# Patient Record
Sex: Female | Born: 1975 | Race: White | Hispanic: No | Marital: Married | Smoking: Never smoker
Health system: Southern US, Community
[De-identification: ages and names within clinical notes are randomized; demographics above are authoritative.]

## PROBLEM LIST (undated history)

## (undated) DIAGNOSIS — E039 Hypothyroidism, unspecified: Secondary | ICD-10-CM

## (undated) DIAGNOSIS — K219 Gastro-esophageal reflux disease without esophagitis: Secondary | ICD-10-CM

## (undated) DIAGNOSIS — R0600 Dyspnea, unspecified: Secondary | ICD-10-CM

## (undated) DIAGNOSIS — R519 Headache, unspecified: Secondary | ICD-10-CM

## (undated) DIAGNOSIS — M797 Fibromyalgia: Secondary | ICD-10-CM

## (undated) DIAGNOSIS — F329 Major depressive disorder, single episode, unspecified: Secondary | ICD-10-CM

## (undated) DIAGNOSIS — F32A Depression, unspecified: Secondary | ICD-10-CM

## (undated) DIAGNOSIS — J849 Interstitial pulmonary disease, unspecified: Secondary | ICD-10-CM

## (undated) DIAGNOSIS — K589 Irritable bowel syndrome without diarrhea: Secondary | ICD-10-CM

## (undated) DIAGNOSIS — F419 Anxiety disorder, unspecified: Secondary | ICD-10-CM

## (undated) DIAGNOSIS — M1990 Unspecified osteoarthritis, unspecified site: Secondary | ICD-10-CM

## (undated) DIAGNOSIS — E119 Type 2 diabetes mellitus without complications: Secondary | ICD-10-CM

## (undated) HISTORY — DX: Anxiety disorder, unspecified: F41.9

## (undated) HISTORY — DX: Interstitial pulmonary disease, unspecified: J84.9

## (undated) HISTORY — DX: Hypothyroidism, unspecified: E03.9

## (undated) HISTORY — DX: Depression, unspecified: F32.A

## (undated) HISTORY — DX: Major depressive disorder, single episode, unspecified: F32.9

## (undated) HISTORY — DX: Fibromyalgia: M79.7

## (undated) HISTORY — DX: Unspecified osteoarthritis, unspecified site: M19.90

## (undated) HISTORY — DX: Irritable bowel syndrome, unspecified: K58.9

---

## 1997-08-25 DIAGNOSIS — E039 Hypothyroidism, unspecified: Secondary | ICD-10-CM

## 1997-08-25 HISTORY — DX: Hypothyroidism, unspecified: E03.9

## 1999-08-26 LAB — HM COLONOSCOPY

## 2001-10-20 ENCOUNTER — Other Ambulatory Visit: Admission: RE | Admit: 2001-10-20 | Discharge: 2001-10-20 | Payer: Self-pay | Admitting: Obstetrics and Gynecology

## 2002-10-25 ENCOUNTER — Other Ambulatory Visit: Admission: RE | Admit: 2002-10-25 | Discharge: 2002-10-25 | Payer: Self-pay | Admitting: Obstetrics and Gynecology

## 2003-11-15 ENCOUNTER — Other Ambulatory Visit: Admission: RE | Admit: 2003-11-15 | Discharge: 2003-11-15 | Payer: Self-pay | Admitting: Obstetrics and Gynecology

## 2004-06-21 ENCOUNTER — Other Ambulatory Visit: Admission: RE | Admit: 2004-06-21 | Discharge: 2004-06-21 | Payer: Self-pay | Admitting: Obstetrics and Gynecology

## 2004-07-02 ENCOUNTER — Ambulatory Visit: Payer: Self-pay | Admitting: Internal Medicine

## 2004-08-22 ENCOUNTER — Ambulatory Visit: Payer: Self-pay | Admitting: Endocrinology

## 2004-08-25 ENCOUNTER — Inpatient Hospital Stay (HOSPITAL_COMMUNITY): Admission: AD | Admit: 2004-08-25 | Discharge: 2004-08-28 | Payer: Self-pay | Admitting: Obstetrics and Gynecology

## 2004-11-07 ENCOUNTER — Ambulatory Visit: Payer: Self-pay | Admitting: Endocrinology

## 2004-12-23 ENCOUNTER — Encounter (INDEPENDENT_AMBULATORY_CARE_PROVIDER_SITE_OTHER): Payer: Self-pay | Admitting: *Deleted

## 2004-12-23 LAB — CONVERTED CEMR LAB

## 2005-01-29 ENCOUNTER — Ambulatory Visit: Payer: Self-pay | Admitting: Endocrinology

## 2005-03-13 ENCOUNTER — Ambulatory Visit: Payer: Self-pay | Admitting: Internal Medicine

## 2005-03-20 ENCOUNTER — Ambulatory Visit: Payer: Self-pay | Admitting: Internal Medicine

## 2005-04-17 ENCOUNTER — Ambulatory Visit: Payer: Self-pay | Admitting: Internal Medicine

## 2006-04-15 ENCOUNTER — Ambulatory Visit: Payer: Self-pay | Admitting: Internal Medicine

## 2006-11-02 ENCOUNTER — Ambulatory Visit: Payer: Self-pay | Admitting: Internal Medicine

## 2006-11-02 LAB — CONVERTED CEMR LAB
ALT: 18 units/L (ref 0–40)
AST: 29 units/L (ref 0–37)
Albumin: 3.8 g/dL (ref 3.5–5.2)
Alkaline Phosphatase: 77 units/L (ref 39–117)
BUN: 14 mg/dL (ref 6–23)
Basophils Absolute: 0 10*3/uL (ref 0.0–0.1)
Basophils Relative: 0.9 % (ref 0.0–1.0)
Bilirubin, Direct: 0.1 mg/dL (ref 0.0–0.3)
CO2: 30 meq/L (ref 19–32)
Calcium: 8.6 mg/dL (ref 8.4–10.5)
Chloride: 103 meq/L (ref 96–112)
Creatinine, Ser: 0.8 mg/dL (ref 0.4–1.2)
Eosinophils Absolute: 0.2 10*3/uL (ref 0.0–0.6)
Eosinophils Relative: 3.3 % (ref 0.0–5.0)
Free T4: 0.4 ng/dL — ABNORMAL LOW (ref 0.6–1.6)
GFR calc Af Amer: 108 mL/min
GFR calc non Af Amer: 89 mL/min
Glucose, Bld: 117 mg/dL — ABNORMAL HIGH (ref 70–99)
HCT: 38.1 % (ref 36.0–46.0)
Hemoglobin: 13 g/dL (ref 12.0–15.0)
Hgb A1c MFr Bld: 5.9 % (ref 4.6–6.0)
Lymphocytes Relative: 24.8 % (ref 12.0–46.0)
MCHC: 34.2 g/dL (ref 30.0–36.0)
MCV: 90.9 fL (ref 78.0–100.0)
Monocytes Absolute: 0.5 10*3/uL (ref 0.2–0.7)
Monocytes Relative: 8.8 % (ref 3.0–11.0)
Neutro Abs: 3.2 10*3/uL (ref 1.4–7.7)
Neutrophils Relative %: 62.2 % (ref 43.0–77.0)
Platelets: 258 10*3/uL (ref 150–400)
Potassium: 4.1 meq/L (ref 3.5–5.1)
RBC: 4.19 M/uL (ref 3.87–5.11)
RDW: 15.3 % — ABNORMAL HIGH (ref 11.5–14.6)
Sodium: 140 meq/L (ref 135–145)
T3, Free: 3.7 pg/mL (ref 2.3–4.2)
TSH: >100 microintl units/mL — ABNORMAL HIGH (ref 0.35–5.50)
Total Bilirubin: 0.7 mg/dL (ref 0.3–1.2)
Total Protein: 7.1 g/dL (ref 6.0–8.3)
WBC: 5.2 10*3/uL (ref 4.5–10.5)

## 2006-11-06 ENCOUNTER — Ambulatory Visit: Payer: Self-pay | Admitting: Internal Medicine

## 2006-12-11 ENCOUNTER — Ambulatory Visit: Payer: Self-pay | Admitting: Internal Medicine

## 2006-12-11 LAB — CONVERTED CEMR LAB: TSH: 0.37 microintl units/mL (ref 0.35–5.50)

## 2006-12-24 ENCOUNTER — Ambulatory Visit: Payer: Self-pay | Admitting: Internal Medicine

## 2007-02-12 ENCOUNTER — Ambulatory Visit (HOSPITAL_COMMUNITY): Admission: RE | Admit: 2007-02-12 | Discharge: 2007-02-12 | Payer: Self-pay | Admitting: Obstetrics and Gynecology

## 2007-05-05 ENCOUNTER — Telehealth: Payer: Self-pay | Admitting: Internal Medicine

## 2007-07-19 ENCOUNTER — Ambulatory Visit: Payer: Self-pay | Admitting: Internal Medicine

## 2007-07-25 LAB — CONVERTED CEMR LAB
Hgb A1c MFr Bld: 5.3 % (ref 4.6–6.0)
TSH: 1.77 microintl units/mL (ref 0.35–5.50)

## 2007-07-26 ENCOUNTER — Encounter (INDEPENDENT_AMBULATORY_CARE_PROVIDER_SITE_OTHER): Payer: Self-pay | Admitting: *Deleted

## 2007-07-26 DIAGNOSIS — E039 Hypothyroidism, unspecified: Secondary | ICD-10-CM | POA: Insufficient documentation

## 2007-07-26 DIAGNOSIS — M797 Fibromyalgia: Secondary | ICD-10-CM | POA: Insufficient documentation

## 2007-07-26 DIAGNOSIS — Z8719 Personal history of other diseases of the digestive system: Secondary | ICD-10-CM | POA: Insufficient documentation

## 2008-02-14 ENCOUNTER — Ambulatory Visit: Payer: Self-pay | Admitting: Internal Medicine

## 2008-02-14 DIAGNOSIS — R5381 Other malaise: Secondary | ICD-10-CM

## 2008-02-14 DIAGNOSIS — R5383 Other fatigue: Secondary | ICD-10-CM | POA: Insufficient documentation

## 2008-02-16 ENCOUNTER — Ambulatory Visit: Payer: Self-pay | Admitting: Internal Medicine

## 2008-02-24 ENCOUNTER — Encounter (INDEPENDENT_AMBULATORY_CARE_PROVIDER_SITE_OTHER): Payer: Self-pay | Admitting: *Deleted

## 2008-02-24 LAB — CONVERTED CEMR LAB
ALT: 16 units/L (ref 0–35)
AST: 23 units/L (ref 0–37)
Albumin: 3.7 g/dL (ref 3.5–5.2)
Alkaline Phosphatase: 51 units/L (ref 39–117)
BUN: 15 mg/dL (ref 6–23)
Basophils Absolute: 0 10*3/uL (ref 0.0–0.1)
Basophils Relative: 0.7 % (ref 0.0–1.0)
Bilirubin, Direct: 0.1 mg/dL (ref 0.0–0.3)
CO2: 27 meq/L (ref 19–32)
Calcium: 8.7 mg/dL (ref 8.4–10.5)
Chloride: 104 meq/L (ref 96–112)
Creatinine, Ser: 0.7 mg/dL (ref 0.4–1.2)
Eosinophils Absolute: 0.2 10*3/uL (ref 0.0–0.7)
Eosinophils Relative: 3.8 % (ref 0.0–5.0)
GFR calc Af Amer: 125 mL/min
GFR calc non Af Amer: 103 mL/min
Glucose, Bld: 97 mg/dL (ref 70–99)
HCT: 34.4 % — ABNORMAL LOW (ref 36.0–46.0)
Hemoglobin: 11.7 g/dL — ABNORMAL LOW (ref 12.0–15.0)
Lymphocytes Relative: 22.8 % (ref 12.0–46.0)
MCHC: 34.1 g/dL (ref 30.0–36.0)
MCV: 86.2 fL (ref 78.0–100.0)
Monocytes Absolute: 0.5 10*3/uL (ref 0.1–1.0)
Monocytes Relative: 11.2 % (ref 3.0–12.0)
Neutro Abs: 2.6 10*3/uL (ref 1.4–7.7)
Neutrophils Relative %: 61.5 % (ref 43.0–77.0)
Platelets: 248 10*3/uL (ref 150–400)
Potassium: 4 meq/L (ref 3.5–5.1)
RBC: 3.99 M/uL (ref 3.87–5.11)
RDW: 13.5 % (ref 11.5–14.6)
Sodium: 137 meq/L (ref 135–145)
TSH: 1.15 microintl units/mL (ref 0.35–5.50)
Total Bilirubin: 0.8 mg/dL (ref 0.3–1.2)
Total CK: 134 units/L (ref 7–177)
Total Protein: 6.6 g/dL (ref 6.0–8.3)
Vit D, 1,25-Dihydroxy: 30 (ref 30–89)
WBC: 4.3 10*3/uL — ABNORMAL LOW (ref 4.5–10.5)

## 2008-03-02 ENCOUNTER — Telehealth (INDEPENDENT_AMBULATORY_CARE_PROVIDER_SITE_OTHER): Payer: Self-pay | Admitting: *Deleted

## 2008-03-03 ENCOUNTER — Ambulatory Visit: Payer: Self-pay | Admitting: Internal Medicine

## 2008-03-03 ENCOUNTER — Telehealth (INDEPENDENT_AMBULATORY_CARE_PROVIDER_SITE_OTHER): Payer: Self-pay | Admitting: *Deleted

## 2008-03-14 ENCOUNTER — Telehealth (INDEPENDENT_AMBULATORY_CARE_PROVIDER_SITE_OTHER): Payer: Self-pay | Admitting: *Deleted

## 2008-03-14 LAB — CONVERTED CEMR LAB
Basophils Absolute: 0 10*3/uL (ref 0.0–0.1)
Basophils Relative: 0.6 % (ref 0.0–1.0)
Eosinophils Absolute: 0.3 10*3/uL (ref 0.0–0.7)
Eosinophils Relative: 5.7 % — ABNORMAL HIGH (ref 0.0–5.0)
Folate: 17.8 ng/mL
HCT: 37.3 % (ref 36.0–46.0)
Hemoglobin: 12.8 g/dL (ref 12.0–15.0)
Iron: 42 ug/dL (ref 42–145)
Lymphocytes Relative: 20.1 % (ref 12.0–46.0)
MCHC: 34.3 g/dL (ref 30.0–36.0)
MCV: 86.4 fL (ref 78.0–100.0)
Monocytes Absolute: 0.6 10*3/uL (ref 0.1–1.0)
Monocytes Relative: 13 % — ABNORMAL HIGH (ref 3.0–12.0)
Neutro Abs: 2.8 10*3/uL (ref 1.4–7.7)
Neutrophils Relative %: 60.6 % (ref 43.0–77.0)
Platelets: 258 10*3/uL (ref 150–400)
RBC: 4.32 M/uL (ref 3.87–5.11)
RDW: 13.6 % (ref 11.5–14.6)
Saturation Ratios: 9.4 % — ABNORMAL LOW (ref 20.0–50.0)
Transferrin: 318.9 mg/dL (ref >=212.0)
Vitamin B-12: 165 pg/mL — ABNORMAL LOW (ref 211–911)
WBC: 4.6 10*3/uL (ref 4.5–10.5)

## 2008-03-17 ENCOUNTER — Ambulatory Visit: Payer: Self-pay | Admitting: Internal Medicine

## 2008-03-17 DIAGNOSIS — E538 Deficiency of other specified B group vitamins: Secondary | ICD-10-CM | POA: Insufficient documentation

## 2008-03-22 ENCOUNTER — Ambulatory Visit: Payer: Self-pay | Admitting: Internal Medicine

## 2008-03-30 ENCOUNTER — Ambulatory Visit: Payer: Self-pay | Admitting: Internal Medicine

## 2008-04-05 ENCOUNTER — Ambulatory Visit: Payer: Self-pay | Admitting: Internal Medicine

## 2008-04-17 ENCOUNTER — Telehealth: Payer: Self-pay | Admitting: Internal Medicine

## 2008-04-19 ENCOUNTER — Ambulatory Visit: Payer: Self-pay | Admitting: Internal Medicine

## 2008-04-19 DIAGNOSIS — F418 Other specified anxiety disorders: Secondary | ICD-10-CM | POA: Insufficient documentation

## 2008-04-19 DIAGNOSIS — F411 Generalized anxiety disorder: Secondary | ICD-10-CM

## 2008-05-05 ENCOUNTER — Ambulatory Visit: Payer: Self-pay | Admitting: Internal Medicine

## 2008-06-19 ENCOUNTER — Ambulatory Visit: Payer: Self-pay | Admitting: Internal Medicine

## 2008-07-10 ENCOUNTER — Ambulatory Visit: Payer: Self-pay | Admitting: Internal Medicine

## 2008-07-25 ENCOUNTER — Telehealth: Payer: Self-pay | Admitting: Internal Medicine

## 2008-07-27 ENCOUNTER — Ambulatory Visit: Payer: Self-pay | Admitting: Internal Medicine

## 2008-07-30 LAB — CONVERTED CEMR LAB
TSH: 9.02 microintl units/mL — ABNORMAL HIGH (ref 0.35–5.50)
Vit D, 1,25-Dihydroxy: 44 (ref 30–89)

## 2008-07-31 ENCOUNTER — Telehealth (INDEPENDENT_AMBULATORY_CARE_PROVIDER_SITE_OTHER): Payer: Self-pay | Admitting: *Deleted

## 2008-08-02 ENCOUNTER — Ambulatory Visit: Payer: Self-pay | Admitting: Internal Medicine

## 2008-08-22 ENCOUNTER — Ambulatory Visit: Payer: Self-pay | Admitting: Internal Medicine

## 2008-08-22 ENCOUNTER — Telehealth (INDEPENDENT_AMBULATORY_CARE_PROVIDER_SITE_OTHER): Payer: Self-pay | Admitting: *Deleted

## 2008-09-25 ENCOUNTER — Telehealth (INDEPENDENT_AMBULATORY_CARE_PROVIDER_SITE_OTHER): Payer: Self-pay | Admitting: *Deleted

## 2008-09-25 ENCOUNTER — Telehealth: Payer: Self-pay | Admitting: Internal Medicine

## 2008-09-26 ENCOUNTER — Ambulatory Visit: Payer: Self-pay | Admitting: Internal Medicine

## 2008-10-06 ENCOUNTER — Ambulatory Visit: Payer: Self-pay | Admitting: Internal Medicine

## 2008-10-11 LAB — CONVERTED CEMR LAB
Basophils Absolute: 0 10*3/uL (ref 0.0–0.1)
Basophils Relative: 0.4 % (ref 0.0–3.0)
Eosinophils Absolute: 0.2 10*3/uL (ref 0.0–0.7)
Eosinophils Relative: 4 % (ref 0.0–5.0)
Folate: 15.9 ng/mL
HCT: 35.6 % — ABNORMAL LOW (ref 36.0–46.0)
Hemoglobin: 12.1 g/dL (ref 12.0–15.0)
Iron: 34 ug/dL — ABNORMAL LOW (ref 42–145)
Lymphocytes Relative: 28.8 % (ref 12.0–46.0)
MCHC: 34.1 g/dL (ref 30.0–36.0)
MCV: 88 fL (ref 78.0–100.0)
Monocytes Absolute: 0.4 10*3/uL (ref 0.1–1.0)
Monocytes Relative: 11.4 % (ref 3.0–12.0)
Neutro Abs: 2.1 10*3/uL (ref 1.4–7.7)
Neutrophils Relative %: 55.4 % (ref 43.0–77.0)
Platelets: 260 10*3/uL (ref 150–400)
RBC: 4.04 M/uL (ref 3.87–5.11)
RDW: 12.3 % (ref 11.5–14.6)
Saturation Ratios: 7.3 % — ABNORMAL LOW (ref 20.0–50.0)
TSH: 0.46 microintl units/mL (ref 0.35–5.50)
Transferrin: 331 mg/dL (ref >=212.0)
Vitamin B-12: 359 pg/mL (ref 211–911)
WBC: 3.8 10*3/uL — ABNORMAL LOW (ref 4.5–10.5)

## 2008-10-13 ENCOUNTER — Encounter (INDEPENDENT_AMBULATORY_CARE_PROVIDER_SITE_OTHER): Payer: Self-pay | Admitting: *Deleted

## 2008-10-13 ENCOUNTER — Telehealth (INDEPENDENT_AMBULATORY_CARE_PROVIDER_SITE_OTHER): Payer: Self-pay | Admitting: *Deleted

## 2008-11-22 ENCOUNTER — Ambulatory Visit: Payer: Self-pay | Admitting: Internal Medicine

## 2009-01-02 ENCOUNTER — Ambulatory Visit: Payer: Self-pay | Admitting: Internal Medicine

## 2009-01-24 ENCOUNTER — Ambulatory Visit: Payer: Self-pay | Admitting: Internal Medicine

## 2009-01-24 ENCOUNTER — Telehealth (INDEPENDENT_AMBULATORY_CARE_PROVIDER_SITE_OTHER): Payer: Self-pay | Admitting: *Deleted

## 2009-03-15 ENCOUNTER — Ambulatory Visit: Payer: Self-pay | Admitting: Internal Medicine

## 2009-03-30 ENCOUNTER — Ambulatory Visit: Payer: Self-pay | Admitting: Family Medicine

## 2009-03-30 LAB — CONVERTED CEMR LAB
Bilirubin Urine: NEGATIVE
Glucose, Urine, Semiquant: NEGATIVE
Ketones, urine, test strip: NEGATIVE
Nitrite: NEGATIVE
Protein, U semiquant: NEGATIVE
Specific Gravity, Urine: 1.01
Urobilinogen, UA: 0.2
pH: 6

## 2009-03-31 ENCOUNTER — Encounter: Payer: Self-pay | Admitting: Internal Medicine

## 2009-04-03 ENCOUNTER — Encounter (INDEPENDENT_AMBULATORY_CARE_PROVIDER_SITE_OTHER): Payer: Self-pay | Admitting: *Deleted

## 2009-04-25 ENCOUNTER — Ambulatory Visit: Payer: Self-pay | Admitting: Family Medicine

## 2009-07-13 ENCOUNTER — Ambulatory Visit: Payer: Self-pay | Admitting: Family

## 2009-08-14 ENCOUNTER — Encounter (INDEPENDENT_AMBULATORY_CARE_PROVIDER_SITE_OTHER): Payer: Self-pay | Admitting: *Deleted

## 2009-08-27 ENCOUNTER — Ambulatory Visit: Payer: Self-pay | Admitting: Internal Medicine

## 2009-08-30 LAB — CONVERTED CEMR LAB
TSH: 1.61 microintl units/mL (ref 0.35–5.50)
Vitamin B-12: 221 pg/mL (ref 211–911)

## 2009-11-29 ENCOUNTER — Ambulatory Visit: Payer: Self-pay | Admitting: Internal Medicine

## 2010-04-03 ENCOUNTER — Ambulatory Visit: Payer: Self-pay | Admitting: Internal Medicine

## 2010-04-08 LAB — CONVERTED CEMR LAB
Basophils Absolute: 0 10*3/uL (ref 0.0–0.1)
Basophils Relative: 0.3 % (ref 0.0–3.0)
Eosinophils Absolute: 0.1 10*3/uL (ref 0.0–0.7)
Eosinophils Relative: 2.2 % (ref 0.0–5.0)
HCT: 40.4 % (ref 36.0–46.0)
Hemoglobin: 13.6 g/dL (ref 12.0–15.0)
Lymphocytes Relative: 17.1 % (ref 12.0–46.0)
Lymphs Abs: 1 10*3/uL (ref 0.7–4.0)
MCHC: 33.6 g/dL (ref 30.0–36.0)
MCV: 95.7 fL (ref 78.0–100.0)
Monocytes Absolute: 0.3 10*3/uL (ref 0.1–1.0)
Monocytes Relative: 4.9 % (ref 3.0–12.0)
Neutro Abs: 4.2 10*3/uL (ref 1.4–7.7)
Neutrophils Relative %: 75.5 % (ref 43.0–77.0)
Platelets: 235 10*3/uL (ref 150.0–400.0)
RBC: 4.22 M/uL (ref 3.87–5.11)
RDW: 12.8 % (ref 11.5–14.6)
TSH: 2.2 microintl units/mL (ref 0.35–5.50)
Vitamin B-12: 201 pg/mL — ABNORMAL LOW (ref 211–911)
WBC: 5.6 10*3/uL (ref 4.5–10.5)

## 2010-05-17 ENCOUNTER — Ambulatory Visit: Payer: Self-pay | Admitting: Family Medicine

## 2010-05-17 DIAGNOSIS — M25569 Pain in unspecified knee: Secondary | ICD-10-CM | POA: Insufficient documentation

## 2010-07-15 ENCOUNTER — Ambulatory Visit: Payer: Self-pay | Admitting: Family Medicine

## 2010-07-15 ENCOUNTER — Encounter: Payer: Self-pay | Admitting: Internal Medicine

## 2010-07-15 DIAGNOSIS — M79671 Pain in right foot: Secondary | ICD-10-CM | POA: Insufficient documentation

## 2010-09-24 NOTE — Assessment & Plan Note (Signed)
Summary: b-12/cbs   Nurse Visit    Prior Medications: LEVOTHYROXINE SODIUM 150 MCG TABS (LEVOTHYROXINE SODIUM) take 1 tablet daily by mouth SAVELLA 50 MG  TABS (MILNACIPRAN HCL) 1 by mouth two times a day AMOXIL 500 MG  CAPS (AMOXICILLIN) 1 three times a day Current Allergies: ! * TAPAZOLE ! SYNTHROID ! PROZAC ! [facility]    Medication Administration  Injection # 1:    Medication: Vit B12 1000 mcg    Diagnosis: B12 DEFICIENCY (ICD-266.2)    Route: IM    Site: L deltoid    Exp Date: 12/23/2009    Lot #: [id number]    Mfr: American Regent    Patient tolerated injection without complications    Given by: Ardyth Man (March 30, 2008 12:17 PM)  Orders Added: 1)  Vit B12 1000 mcg [J3420] 2)  Admin of Therapeutic Inj  intramuscular or subcutaneous Lepidus.Putnam    ]  Medication Administration  Injection # 1:    Medication: Vit B12 1000 mcg    Diagnosis: B12 DEFICIENCY (ICD-266.2)    Route: IM    Site: L deltoid    Exp Date: 12/23/2009    Lot #: [id number]    Mfr: American Regent    Patient tolerated injection without complications    Given by: Ardyth Man (March 30, 2008 12:17 PM)  Orders Added: 1)  Vit B12 1000 mcg [J3420] 2)  Admin of Therapeutic Inj  intramuscular or subcutaneous [96372]    Appended Document: b-12/cbs

## 2010-09-24 NOTE — Progress Notes (Signed)
Summary: change to another med - dr hopper  Phone Note Call from Patient Call back at Work Phone [phone]   Caller: Patient Summary of Call: patient said she feels dr hopper didnt understand message about Liane Comber - she said it was working but cost has gone up & she cant afford it Initial call taken by: Okey Regal [place],  September 25, 2008 4:37 PM  Follow-up for Phone Call        LEFT MESSAGE ON MACHINE INFORMING PATIENT OF DR.HOPPERS ADVISEMENTS. SEE LAST PHONE NOTE. Follow-up by: Shonna Chock,  September 25, 2008 4:47 PM

## 2010-09-24 NOTE — Assessment & Plan Note (Signed)
Summary: pink eye/lch   Vital Signs:  Patient profile:   35 year old female Weight:      158.38 pounds Temp:     99.1 degrees F oral Pulse rate:   76 / minute Pulse rhythm:   regular Resp:     14 per minute BP sitting:   118 / 76  (left arm) Cuff size:   regular  Vitals Entered By: Army Fossa CMA (November 29, 2009 12:04 PM) CC: Pt here c/o "Pink eye". Woke up this a.m. with eye matted shut, very red.  Comments Med list reviewed. Army Fossa CMA  November 29, 2009 12:05 PM    CC:  Pt here c/o "Pink eye". Woke up this a.m. with eye matted shut and very red. Marland Kitchen  History of Present Illness: OS "glued shut" this am with mild pain ; no loss of vision. 35 year old fine. No Rx to date.  Allergies: 1)  ! * Tapazole 2)  ! Synthroid 3)  ! Prozac 4)  ! Wellbutrin  Review of Systems General:  Denies chills, fever, and sweats. Eyes:  Complains of blurring, discharge, eye pain, light sensitivity, and red eye; denies vision loss-1 eye. ENT:  Denies nasal congestion and sinus pressure; No purulence.  Physical Exam  General:  in no acute distress; alert,appropriate and cooperative throughout examination Eyes:  vision grossly intact, pupils equal, pupils round, pupils reactive to light, but scleritis and conjunctival injection OS.   Cervical Nodes:  No lymphadenopathy noted Axillary Nodes:  No palpable lymphadenopathy   Impression & Recommendations:  Problem # 1:  CONJUNCTIVITIS (ICD-372.30)  Complete Medication List: 1)  Levothyroxine Sodium 175 Mcg Tabs (Levothyroxine sodium) .... Take one tablet daily except 1/2 every weds 2)  Vitamin D 1000 Unit Tabs (Cholecalciferol) .Marland Kitchen.. 1 by mouth once daily 3)  Celexa 40 Mg Tabs (Citalopram hydrobromide) .... Daily as directed 4)  Abilify 2 Mg Tabs (Aripiprazole) .... 1/2 once daily 5)  Ferrous Sulfate 83 Mg Tabs (Ferrous sulfate) .Marland Kitchen.. 1 by mouth 1 per day 6)  Sm Glucosamine Sulfate 1000 Mg Tabs (Glucosamine sulfate) .Marland Kitchen.. 1 tab by mouth  once a day  Patient Instructions: 1)  Strict hand washing as described. Erythromycin Ophthalmic  1 cm into lower eye sac every 2-3 hrs while awake X 7-10 days.Report Warning Signs as discussed

## 2010-09-24 NOTE — Letter (Signed)
Summary: Results Follow up Letter  [facility] [place]  [address] [place], [place] [address]   Phone: [phone]  Fax: [phone]    10/13/2008 MRN: [id number]  [name] [address] [place], [place]  [address]  Dear [name],  The following are the results of your recent test(s):  Test         Result    Pap Smear:        Normal _____  Not Normal _____ Comments: ______________________________________________________ Cholesterol: LDL(Bad cholesterol):         Your goal is less than:         HDL (Good cholesterol):       Your goal is more than: Comments:  ______________________________________________________ Mammogram:        Normal _____  Not Normal _____ Comments:  ___________________________________________________________________ Hemoccult:        Normal _____  Not normal _______ Comments:    _____________________________________________________________________ Other Tests: PLEASE SEE ATTACHED LABS DONE ON 10/06/2008    We routinely do not discuss normal results over the telephone.  If you desire a copy of the results, or you have any questions about this information we can discuss them at your next office visit.   Sincerely,

## 2010-09-24 NOTE — Assessment & Plan Note (Signed)
Summary: B 12/KDC   Nurse Visit     Allergies: 1)  ! * Tapazole 2)  ! Synthroid 3)  ! Prozac 4)  ! Wellbutrin     Medication Administration  Injection # 1:    Medication: Vit B12 1000 mcg    Diagnosis: B12 DEFICIENCY (ICD-266.2)    Route: IM    Site: L deltoid    Exp Date: 09/2010    Lot #: [id number]    Mfr: American Regent    Patient tolerated injection without complications    Given by: Floydene Flock CMA (January 24, 2009 10:36 AM)  Orders Added: 1)  Admin of Therapeutic Inj  intramuscular or subcutaneous [96372] 2)  Vit B12 1000 mcg [J3420]      Medication Administration  Injection # 1:    Medication: Vit B12 1000 mcg    Diagnosis: B12 DEFICIENCY (ICD-266.2)    Route: IM    Site: L deltoid    Exp Date: 09/2010    Lot #: [id number]    Mfr: American Regent    Patient tolerated injection without complications    Given by: Floydene Flock CMA (January 24, 2009 10:36 AM)  Orders Added: 1)  Admin of Therapeutic Inj  intramuscular or subcutaneous [96372] 2)  Vit B12 1000 mcg [J3420]

## 2010-09-24 NOTE — Assessment & Plan Note (Signed)
Summary: b-12/cbs   Nurse Visit    Prior Medications: LEVOTHYROXINE SODIUM 150 MCG TABS (LEVOTHYROXINE SODIUM) take 1 tablet daily by mouth SAVELLA 50 MG  TABS (MILNACIPRAN HCL) 1 by mouth two times a day AMOXIL 500 MG  CAPS (AMOXICILLIN) 1 three times a day Current Allergies: ! * TAPAZOLE ! SYNTHROID ! PROZAC ! [facility]    Medication Administration  Injection # 1:    Medication: Vit B12 1000 mcg    Diagnosis: B12 DEFICIENCY (ICD-266.2)    Route: IM    Site: R deltoid    Exp Date: 01/2010    Lot #: [id number]    Mfr: American Regent    Patient tolerated injection without complications    Given by: Floydene Flock CMA (March 22, 2008 3:27 PM)  Orders Added: 1)  Admin of Therapeutic Inj  intramuscular or subcutaneous [96372] 2)  Vit B12 1000 mcg Kallinikos.Fontana    ]  Medication Administration  Injection # 1:    Medication: Vit B12 1000 mcg    Diagnosis: B12 DEFICIENCY (ICD-266.2)    Route: IM    Site: R deltoid    Exp Date: 01/2010    Lot #: [id number]    Mfr: American Regent    Patient tolerated injection without complications    Given by: Floydene Flock CMA (March 22, 2008 3:27 PM)  Orders Added: 1)  Admin of Therapeutic Inj  intramuscular or subcutaneous [96372] 2)  Vit B12 1000 mcg [J3420]

## 2010-09-24 NOTE — Progress Notes (Signed)
Summary: alternative to medication  Phone Note Call from Patient   Summary of Call: Dr.Paz   call back   [phone]  patient said she want to know if there was an alternative to abilify? Initial call taken by: Vanessa Swaz[place],  September 25, 2008 11:32 AM  Follow-up for Phone Call        ABILIFY FILLED ON 08/23/2008-PATIENT SAID MED WAS WORKING. NOW PATIENT IS REQUESTING ALTERNATIVE. Follow-up by: Shonna Chock,  September 25, 2008 2:25 PM  Additional Follow-up for Phone Call Additional follow up Details #1::        I think consulting a Psychiatrist as a Neurochemist would be best long term option to identify optimal medication regimen.I would welcome that expert opinion  Additional Follow-up by: Marga Melnick MD,  September 25, 2008 2:42 PM    Additional Follow-up for Phone Call Additional follow up Details #2::    DR.Lela Gell LEFT MESSAGE ON MACHINE GIVING INSTRUCTION. Follow-up by: Shonna Chock,  September 25, 2008 2:51 PM

## 2010-09-24 NOTE — Assessment & Plan Note (Signed)
Summary: SINUS INFECTION/SCM   Vital Signs:  Patient Profile:   35 Years Old Female Weight:      154.6 pounds Temp:     98.1 degrees F oral Pulse rate:   76 / minute Resp:     15 per minute BP sitting:   110 / 72  (left arm) Cuff size:   large  Vitals Entered By: Shonna Chock (March 03, 2008 12:27 PM)                 Chief Complaint:  CONGESTION, FEVER, DIZZINESS X 2 DAYS, and DISCUSS SELEVA.  History of Present Illness: Myalgias began 24 hrs off Cymbalta(? "Withdrawal [place] ") URI symptoms as of this am with PNDrainage.    Current Allergies: ! * TAPAZOLE ! SYNTHROID ! PROZAC ! WELLBUTRIN     Review of Systems  General      Complains of chills, fever, and sweats.      Temp to 100  Eyes      Denies discharge, eye pain, and red eye.  ENT      Complains of nasal congestion, sinus pressure, and sore throat.      Denies earache.      Facial pain & frontal ha w/o purulence  Resp      Denies cough and sputum productive.   Physical Exam  General:     Well-developed,well-nourished,in no acute distress; alert,appropriate and cooperative throughout examination Eyes:     No corneal or conjunctival inflammation noted. EOMI. Perrla.  Vision grossly normal. Ears:     External ear exam shows no significant lesions or deformities.  Otoscopic examination reveals clear canals, tympanic membranes are intact bilaterally without bulging, retraction, inflammation or discharge. Hearing is grossly normal bilaterally. Nose:     External nasal examination shows no deformity or inflammation. Nasal mucosa are pink and moist without lesions or exudates.Minimal hyponasal speech Mouth:     Oral mucosa and oropharynx without lesions or exudates.  Teeth in good repair. Cervical Nodes:     No lymphadenopathy noted Axillary Nodes:     No palpable lymphadenopathy    Impression & Recommendations:  Problem # 1:  URI (ICD-465.9)  Complete Medication List: 1)  Levothyroxine  Sodium 150 Mcg Tabs (Levothyroxine sodium) .... Take 1 tablet daily by mouth 2)  Savella Titration Pack 12.5 & 25 & 50 Mg Misc (Milnacipran hcl) .... As per pack 3)  Amoxil 500 Mg Caps (Amoxicillin) .Marland Kitchen.. 1 three times a day   Patient Instructions: 1)  Drink as much fluid as you can tolerate for the next few days.Neti pot once daily as needed for nasal congestion.   Prescriptions: AMOXIL 500 MG  CAPS (AMOXICILLIN) 1 three times a day  #30 x 0   Entered and Authorized by:   Marga Melnick MD   Signed by:   Marga Melnick MD on 03/03/2008   Method used:   Print then Give to Patient   RxID:   [id number] SAVELLA TITRATION PACK 12.5 & 25 & 50 MG  MISC (MILNACIPRAN HCL) as per pack  #1 pack x 0   Entered and Authorized by:   Marga Melnick MD   Signed by:   Marga Melnick MD on 03/03/2008   Method used:   Print then Give to Patient   RxID:   [id number]  ]

## 2010-09-24 NOTE — Assessment & Plan Note (Signed)
Summary: ro4--f/u from last visit//ph   Vital Signs:  Patient Profile:   35 Years Old Female Weight:      157.8 pounds Pulse rate:   94 / minute Resp:     15 per minute BP sitting:   108 / 68  (left arm) Cuff size:   large  Vitals Entered By: Shonna Chock (July 10, 2008 4:04 PM)                 Chief Complaint:  FOLLOW-UP.  History of Present Illness: On Citalopram  AND Savella ; Savella only partially controlling Fibromyalgia.  Citalopram  helping mood swings ,but "more agitated & frustrated" with suboptimal  response to  two meds.She is having difficulty describing  present status . She is agreeable to assessment by a Psychiatrist in reference to optimal "neurochemistry" management  &  ruling out Bipolar Disorder     Current Allergies (reviewed today): ! * TAPAZOLE ! SYNTHROID ! PROZAC ! WELLBUTRIN     Review of Systems  General      Complains of fatigue.      Denies weight loss.      7# weight gain  GI      Complains of hemorrhoids and indigestion.      Denies abdominal pain, bloody stools, and dark tarry stools.      [place] not interested in eating & "I feel full after a few bites". Occa dyspepsia. Hemorrhoidal flare with hard stools  Psych      Complains of anxiety, depression, easily angered, easily tearful, and irritability.   Physical Exam  General:     in no acute distress; alert,appropriate and cooperative throughout examination; mood labile Neck:     No deformities, masses, or tenderness noted. Neurologic:     alert & oriented X3 and DTRs symmetrical and normal.   Psych:     She thought "pink pill " was Cymbalta; it is generic Citalopram.Not anxious appearing, and not depressed appearing.   Intermittently laughing . MDQ :7+ answers ; described as "minor" issue     Impression & Recommendations:  Problem # 1:  FIBROMYALGIA (ICD-729.1)  Problem # 2:  ANXIETY STATE, UNSPECIFIED (ICD-300.00)  Mixed anxiety/ depression by history;R/O Bipolar  Disorder The following medications were removed from the medication list:    Cymbalta 60 Mg Cpep (Duloxetine hcl) .Marland Kitchen... 1 by mouth once daily  Her updated medication list for this problem includes:    Citalopram Hydrobromide 20 Mg Tabs (Citalopram hydrobromide) .Marland Kitchen... 1 once daily  Orders: Psychiatric Referral (Psych)   Complete Medication List: 1)  Levothyroxine Sodium 150 Mcg Tabs (Levothyroxine sodium) .... Take 1 tablet daily by mouth 2)  Savella 50 Mg Tabs (Milnacipran hcl) .Marland Kitchen.. 1 by mouth two times a day 3)  Accutane 60mg   .... Alternates between 1 by mouth once daily and 2 by mouth once daily 4)  Citalopram Hydrobromide 20 Mg Tabs (Citalopram hydrobromide) .Marland Kitchen.. 1 once daily   Patient Instructions: 1)  Wean off Savella as per the  titration pack & then add Abilify 2 mg  once daily  as trial before the Neurochemistry evaluation by the Psychiatrist. 2)  Please schedule a follow-up appointment in 3 weeks if Psychiatric evaluation not completed within that time span.   Prescriptions: CITALOPRAM HYDROBROMIDE 20 MG TABS (CITALOPRAM HYDROBROMIDE) 1 once daily  #30 x 5   Entered and Authorized by:   Marga Melnick MD   Signed by:   Marga Melnick MD on 07/11/2008   Method  used:   [facility]ron[facility] to        [phone][facility] (retail)       [address]       [place], [place]  [address]       Ph: [phone]       Fax: [phone]   RxID:   [id number]  ]

## 2010-09-24 NOTE — Assessment & Plan Note (Signed)
Summary: b-12 shot--ph   Nurse Visit     Allergies: 1)  ! * Tapazole 2)  ! Synthroid 3)  ! Prozac 4)  ! Wellbutrin     Medication Administration  Injection # 1:    Medication: Vit B12 1000 mcg    Diagnosis: B12 DEFICIENCY (ICD-266.2)    Route: IM    Site: L deltoid    Exp Date: 07/2010    Lot #: [id number]    Mfr: American Regent    Patient tolerated injection without complications    Given by: Floydene Flock CMA (November 22, 2008 11:29 AM)  Orders Added: 1)  Admin of Therapeutic Inj  intramuscular or subcutaneous [96372] 2)  Vit B12 1000 mcg Kallinikos.Fontana    ]  Medication Administration  Injection # 1:    Medication: Vit B12 1000 mcg    Diagnosis: B12 DEFICIENCY (ICD-266.2)    Route: IM    Site: L deltoid    Exp Date: 07/2010    Lot #: [id number]    Mfr: American Regent    Patient tolerated injection without complications    Given by: Floydene Flock CMA (November 22, 2008 11:29 AM)  Orders Added: 1)  Admin of Therapeutic Inj  intramuscular or subcutaneous [96372] 2)  Vit B12 1000 mcg [J3420]

## 2010-09-24 NOTE — Progress Notes (Signed)
Summary: abilfy samples//HOP  Phone Note Call from Patient Call back at Home Phone [phone]   Caller: Patient Summary of Call: pt called want samples of ABILIFY, HAVE A RX ,but out of work was hoping to get some samples.  Last ov 10/06/08 (pt was informed of office policy ref to samples) Initial call taken by: Kandice Hams,  January 24, 2009 9:36 AM  Follow-up for Phone Call        OK (5 mg 1/2 pill ,now on 2 mg?)  X 6 weeks  if available Follow-up by: Marga Melnick MD,  January 24, 2009 1:58 PM  Additional Follow-up for Phone Call Additional follow up Details #1::        LEFT MESSAGE ON MACHINE WITH DR.HOPPER'S RESPONSE. SAMPLES PLACED AT THE FRONT Additional Follow-up by: Shonna Chock,  January 24, 2009 2:33 PM

## 2010-09-24 NOTE — Progress Notes (Signed)
Summary: **Recent Labs**  Phone Note Outgoing Call   Call placed by: Shonna Chock,  March 14, 2008 2:45 PM Call placed to: Patient Details for Reason: Recent Labs Done on 03/03/2008 Summary of Call: Left message on machine, reason for call was to inform patient of recent lab reports and instruction. Shonna Chock  March 14, 2008 2:46 PM   Follow-up for Phone Call        SPOKE WITH PATIENT, SCHEDULE APPOINTMENT FOR B12 INJECTION, WHILE ON THE PHONE PATIENT SAID SHE NEEDS A REFILL ON SAVELL, SHE IS NOW TAKING 50MG  two times a day . ([facility]. WILL SEND TO DR.HOPPER FOR APPROVAL. Follow-up by: Shonna Chock,  March 15, 2008 9:16 AM  Additional Follow-up for Phone Call Additional follow up Details #1::        PER DR.HOPPER OK TO FILL #60/5REFILL.Fredric Mare Malloy  March 15, 2008 9:20 AM     New/Updated Medications: SAVELLA 50 MG  TABS (MILNACIPRAN HCL) 1 by mouth two times a day   Prescriptions: SAVELLA 50 MG  TABS (MILNACIPRAN HCL) 1 by mouth two times a day  #60 x 5   Entered by:   Shonna Chock   Authorized by:   Marga Melnick MD   Signed by:   Shonna Chock on 03/15/2008   Method used:   Electronically sent to ...       [facility] [phone]*       [address]       [place], [place]  [address]       Ph: [phone]       Fax: [phone]   RxID:   [id number]

## 2010-09-24 NOTE — Assessment & Plan Note (Signed)
Summary: b12--tl   Nurse Visit    Prior Medications: LEVOTHYROXINE SODIUM 150 MCG TABS (LEVOTHYROXINE SODIUM) take 1 tablet daily by mouth SAVELLA 50 MG  TABS (MILNACIPRAN HCL) 1 by mouth two times a day AMOXIL 500 MG  CAPS (AMOXICILLIN) 1 three times a day Current Allergies: ! * TAPAZOLE ! SYNTHROID ! PROZAC ! [facility]    Medication Administration  Injection # 1:    Medication: Vit B12 1000 mcg    Diagnosis: B12 DEFICIENCY (ICD-266.2)    Route: IM    Site: R deltoid    Exp Date: 01/2010    Lot #: [id number]    Mfr: American Regent    Patient tolerated injection without complications    Given by: Floydene Flock CMA (April 05, 2008 11:52 AM)  Orders Added: 1)  Admin of Therapeutic Inj  intramuscular or subcutaneous [96372] 2)  Vit B12 1000 mcg Kallinikos.Fontana    ]  Medication Administration  Injection # 1:    Medication: Vit B12 1000 mcg    Diagnosis: B12 DEFICIENCY (ICD-266.2)    Route: IM    Site: R deltoid    Exp Date: 01/2010    Lot #: [id number]    Mfr: American Regent    Patient tolerated injection without complications    Given by: Floydene Flock CMA (April 05, 2008 11:52 AM)  Orders Added: 1)  Admin of Therapeutic Inj  intramuscular or subcutaneous [96372] 2)  Vit B12 1000 mcg [J3420]

## 2010-09-24 NOTE — Letter (Signed)
Summary: Primary Care Appointment Letter  [facility] [place]  [address] [place], [place] [address]   Phone: [phone]  Fax: [phone]    08/14/2009 MRN: [id number]  [name] [address] [place], [place]  [address]  Dear [name],   Your Primary Care Physician Marga Melnick MD has indicated that:    ____X___it is time to schedule an appointment FOR LABS PER LAST LABS FROM 10/11/08 so pls call and schedule.    _______you missed your appointment on______ and need to call and          reschedule.    _______you need to have lab work done.    _______you need to schedule an appointment discuss lab or test results.    _______you need to call to reschedule your appointment that is                       scheduled on _________.     Please call our office as soon as possible. Our phone number is [phone]_____. Our office is open 8a-5p, Monday through Friday.     Thank you,    [facility]

## 2010-09-24 NOTE — Progress Notes (Signed)
Summary: **Recent Labs**  Phone Note Outgoing Call Call back at Home Phone [phone]   Call placed by: Shonna Chock,  July 31, 2008 9:24 AM Summary of Call: Left message on machine for patient to return call when avaliable, Reason for call: 1.) TSH goal = 1-3; HYPOthyroidism present. Please see me with actual thyroid pills. [facility]- NEED TO SCHEDULE APPOINTMENT!!  2.) Super ; vitamin D deficiency corrected (goal = > 35). No change in dose;recheck in 6 months. [facility]  [name]  July 31, 2008 9:25 AM     Follow-up for Phone Call        D/W PATIENT . . . . Marland KitchenSCHEDULED APPOINTMENT TO FUTHER ADDRESS LABS 08/02/2008./[name] [facility]  July 31, 2008 1:24 PM  Follow-up by: Shonna Chock,  July 31, 2008 1:24 PM

## 2010-09-24 NOTE — Consult Note (Signed)
Summary: [facility]  [facility]   Imported By: Lanelle Bal 07/26/2010 11:34:18  _____________________________________________________________________  External Attachment:    Type:   Image     Comment:   External Document

## 2010-09-24 NOTE — Letter (Signed)
Summary: Results Follow up Letter  [facility] [place]  [address]   [place],  [address]   Phone: [phone]  Fax: [phone]    02/24/2008 MRN: [id number]  [name] [address] [place],   [address]  Dear Ms. Garduno,  The following are the results of your recent test(s):  Test         Result    Pap Smear:        Normal _____  Not Normal _____ Comments: ______________________________________________________ Cholesterol: LDL(Bad cholesterol):         Your goal is less than:         HDL (Good cholesterol):       Your goal is more than: Comments:  ______________________________________________________ Mammogram:        Normal _____  Not Normal _____ Comments:  ___________________________________________________________________ Hemoccult:        Normal _____  Not normal _______ Comments:    _____________________________________________________________________ Other Tests:    We routinely do not discuss normal results over the telephone.  If you desire a copy of the results, or you have any questions about this information we can discuss them at your next office visit.   Sincerely,    

## 2010-09-24 NOTE — Assessment & Plan Note (Signed)
Summary: FOLLOW-UP ON LABS/SCM   Vital Signs:  Patient Profile:   35 Years Old Female Weight:      155 pounds Pulse rate:   98 / minute Resp:     14 per minute BP sitting:   122 / 80  (left arm) Cuff size:   large  Vitals Entered By: Shonna Chock (August 02, 2008 8:09 AM)                 Chief Complaint:  FOLLOW-UP ON LABS.  History of Present Illness: She is taking Synthroid 150 micrograms with BCP .On BCP due to Accutane therapy.TSH now 9.02 ;it was 1.15 in 6/09 on same dose. Vit D now 44;it was 30 . She is still on B12 shots. She has appt with [facility].    Current Allergies (reviewed today): ! * TAPAZOLE ! SYNTHROID ! PROZAC ! WELLBUTRIN     Review of Systems  General      Denies weight loss.  CV      Denies palpitations.  GI      Denies constipation.      Some loose stool occa  Derm      Denies changes in nail beds, dryness, and hair loss.  Neuro      Complains of numbness and tingling.      CTS occa @ night  Endo      Denies cold intolerance, heat intolerance, and weight change.   Physical Exam  General:     well-nourished,in no acute distress; alert,appropriate and cooperative throughout examination Neck:     No deformities, masses, or tenderness noted. Heart:     Normal rate and regular rhythm. S1 and S2 normal without gallop, murmur, click, rub or other extra sounds. Neurologic:     alert & oriented X3 and DTRs symmetrical and normal.   Skin:     Intact without suspicious lesions or rashes Psych:     memory intact for recent and remote, normally interactive, and good eye contact.      Impression & Recommendations:  Problem # 1:  HYPOTHYROIDISM (ICD-244.9)  Problem # 2:  B12 DEFICIENCY (ICD-266.2)  Complete Medication List: 1)  Levothyroxine Sodium 175 Mcg Tabs (levothyroxine Sodium)  .... Take 1 tablet daily by mouth 2)  Savella 50 Mg Tabs (Milnacipran hcl) .Marland Kitchen.. 1 by mouth two times a day 3)  Accutane 60mg   ....  Alternates between 1 by mouth once daily and 2 by mouth once daily 4)  Abilify 2 Mg Tabs (Aripiprazole) .Marland Kitchen.. 1 by mouth once daily 5)  Low-ogestrel 0.3-30 Mg-mcg Tabs (Norgestrel-ethinyl estradiol) .Marland Kitchen.. 1 by mouth q d   Patient Instructions: 1)  Add 1/2 of 50 micrograms Synthroid to 150 micrograms dose. 2)  Please schedule a follow-up appointment in 2 months. 3)  TSH, B12 level prior to visit, ICD-9: 266.2,244.9. Do not take Synthroid with any other meds   ]

## 2010-09-24 NOTE — Assessment & Plan Note (Signed)
Summary: 2 MONTH FOLLOW-UP   Vital Signs:  Patient Profile:   35 Years Old Female Weight:      162 pounds Pulse rate:   72 / minute Resp:     15 per minute BP sitting:   118 / 72  (left arm) Cuff size:   large  Vitals Entered By: Shonna Chock (October 06, 2008 9:08 AM)                 Chief Complaint:  2 MONTH FOLLOW-UP ON MEDICATION.  History of Present Illness: The Citalopram 20 mg once daily & Abilify 2mg  were beneficial ; mood  & fibromyalgia symptoms  were significantly improved. Savella less effective for fibromyalgia symptoms. Abilify copay rose from $20 /month to $50/ month. She lost her job but she is insured under her husband's [employer].    Current Allergies (reviewed today): ! * TAPAZOLE ! SYNTHROID ! PROZAC ! WELLBUTRIN     Review of Systems  General      Complains of fatigue.      Weight variable  ENT      Denies difficulty swallowing and hoarseness.  GI      Denies constipation and diarrhea.  MS      Complains of joint pain and muscle aches.      Denies joint redness, joint swelling, and muscle weakness.      Aching from thighs down  @ rest @ night with some joint symptoms; worse with cold . These symptoms better on Abilify  Derm      Denies changes in nail beds, dryness, and hair loss.  Neuro      Complains of numbness and tingling.      N&T in am ,positional ; not using wrist splint. No classic restless leg symptoms  Psych      Complains of easily tearful.      Denies anxiety, depression, easily angered, irritability, panic attacks, and suicidal thoughts/plans.      "I'm OK but emotions rocky"  Endo      Denies cold intolerance, excessive hunger, excessive thirst, excessive urination, and heat intolerance.   Physical Exam  General:     well-nourished,in no acute distress; alert,appropriate and cooperative throughout examination Neck:     No deformities, masses, or tenderness noted. Lungs:     Normal respiratory effort,  chest expands symmetrically. Lungs are clear to auscultation, no crackles or wheezes. Heart:     Normal rate and regular rhythm. S1 and S2 normal without gallop, murmur, click, rub or other extra sounds. Pulses:     R and Lradial,dorsalis pedis and posterior tibial pulses are full and equal bilaterally Extremities:     No clubbing, cyanosis,  deformity noted with normal full range of motion of all joints.  trace left pedal edema and trace right pedal edema.   Neurologic:     alert & oriented X3, strength normal in all extremities except minimal asymmetry in hands (L weaker than R but R handed), and DTRs symmetrical and normal.   Skin:     Intact without suspicious lesions or rashes Cervical Nodes:     No lymphadenopathy noted Axillary Nodes:     No palpable lymphadenopathy Psych:     memory intact for recent and remote, normally interactive, good eye contact, not anxious appearing, and not depressed appearing.      Impression & Recommendations:  Problem # 1:  FIBROMYALGIA (ICD-729.1) Improved with combo  Citalopram & Abilify Orders: Venipuncture (06237)  Problem # 2:  ANXIETY STATE, UNSPECIFIED (ICD-300.00)  Orders: Venipuncture ([address])   Problem # 3:  B12 DEFICIENCY (ICD-266.2)  Orders: Venipuncture (60454) TLB-CBC Platelet - w/Differential (85025-CBCD) TLB-B12 + Folate Pnl (09811_91478-G95/AOZ)   Problem # 4:  HYPOTHYROIDISM (ICD-244.9)  Orders: Venipuncture (30865) TLB-TSH (Thyroid Stimulating Hormone) (84443-TSH)   Problem # 5:  FATIGUE (ICD-780.79)  Orders: Venipuncture (78469) TLB-CBC Platelet - w/Differential (85025-CBCD) TLB-TSH (Thyroid Stimulating Hormone) (84443-TSH) TLB-IBC Pnl (Iron/FE;Transferrin) (83550-IBC) TLB-B12 + Folate Pnl (62952_84132-G40/NUU)   Complete Medication List: 1)  Levothyroxine Sodium 175 Mcg Tabs (levothyroxine Sodium)  .... Take 1 tablet daily by mouth 2)  Accutane 60mg   .... Alternates between 1 by mouth once daily and  2 by mouth once daily 3)  Low-ogestrel 0.3-30 Mg-mcg Tabs (Norgestrel-ethinyl estradiol) .Marland Kitchen.. 1 by mouth q d 4)  Vitamin D 1000 Unit Tabs (Cholecalciferol) .Marland Kitchen.. 1 by mouth once daily 5)  Celexa 40 Mg Tabs (citalopram Hydrobromide)  .... Daily as directed 6)  Abilify 2 Mg Tabs (Aripiprazole) .Marland Kitchen.. 1 once daily   Patient Instructions: 1)  PreAuthorization for Abilify will be pursued based on history as recorded   Prescriptions: ABILIFY 2 MG TABS (ARIPIPRAZOLE) 1 once daily  #30 x 5   Entered and Authorized by:   Marga Melnick MD   Signed by:   Marga Melnick MD on 10/06/2008   Method used:   Print then Give to Patient   RxID:   [id number] LEVOTHYROXINE SODIUM 175 MCG TABS (LEVOTHYROXINE SODIUM) take 1 tablet daily by mouth  #90 x 1   Entered and Authorized by:   Marga Melnick MD   Signed by:   Marga Melnick MD on 10/06/2008   Method used:   Print then Give to Patient   RxID:   [id number] CELEXA 40 MG TABS (CITALOPRAM HYDROBROMIDE) daily as directed  #90 x 1   Entered and Authorized by:   Marga Melnick MD   Signed by:   Marga Melnick MD on 10/06/2008   Method used:   Print then Give to Patient   RxID:   [id number]

## 2010-09-24 NOTE — Progress Notes (Signed)
Summary: **RECENT LABS**  Phone Note Outgoing Call Call back at Work Phone [phone]   Call placed by: Shonna Chock,  October 13, 2008 10:51 AM Call placed to: Patient Summary of Call: Left message on machine for patient to return call when avaliable, Reason for call: Minimal anemia with low normal B12 levels & decreased iron stores. Continue daily multivitamin with iron & monthly  B12 shots. Recheck B12 level in 6 months(281.0)TSH is too low; decrease thyroid dose to once daily EXCEPT 1/2 on Weds(goal =TSH 1-3). Hopp    Follow-up for Phone Call        SPOKE WITH PATIENT, PATIENT OK'D ALL INFORMATION. PATIENT WILL CALL BACK TO SCHEDULE APPOINTMENT Follow-up by: Shonna Chock,  October 13, 2008 3:56 PM

## 2010-09-24 NOTE — Assessment & Plan Note (Signed)
Summary: thyroid check.cbs   Vital Signs:  Patient Profile:   35 Years Old Female Weight:      151 pounds Pulse rate:   80 / minute Resp:     15 per minute BP sitting:   118 / 76  (left arm) Cuff size:   large  Vitals Entered By: Shonna Chock (February 14, 2008 3:39 PM)                 Chief Complaint:  THYROID FOLLOW-UP and Fatigue.  History of Present Illness: Fibromyalgia suboptimally controlled by Cymbalta. Fatigue is variable.  Fatigue      This is a 35 year old woman who presents with Fatigue.  Both motivational & physical. Hemorrhoidal bleeding. Appetite varies. Restless sleep.  The patient reports fatigue with moderate exertion.  The patient denies fever, night sweats, weight loss, exertional chest pain, dyspnea, and cough.  The patient denies the following symptoms: leg swelling, orthopnea, PND, melena, adenopathy, severe snoring, daytime sleepiness, and skin changes.  Depressive symptoms include poor sleep.  The patient denies anhedonia.      Current Allergies (reviewed today): ! * TAPAZOLE ! SYNTHROID ! PROZAC ! [facility]  Past Medical History:    Fibromyalgia    Hypothyroidism post PTU Rx for hyperthyroidism 1999 ( [place])   Family History:    Father: LIVING    Mother: LIVING    Siblings:  1 SISTER    PGM:  DM    Son:  ADD    Review of Systems  Eyes      Denies double vision and vision loss-both eyes.  ENT      Denies difficulty swallowing and hoarseness.  Resp      Denies excessive snoring and morning headaches.      No apnea  as per husband  GI      Complains of change in bowel habits and constipation.      Loose stool alternate with constipation  MS      Complains of muscle aches, cramps, and muscle weakness.  Derm      Complains of changes in nail beds and dryness.      Denies hair loss.  Neuro      Complains of difficulty with concentration and memory loss.      Denies numbness and tingling.  Psych      Complains of  irritability.      Denies easily angered and easily tearful.      Variable moods; life stressors esp @ work  Endo      Denies cold intolerance, excessive hunger, excessive thirst, excessive urination, heat intolerance, polyuria, and weight change.      "hot & cold "  Heme      Complains of abnormal bruising.      Denies bleeding.   Physical Exam  General:     Well-developed,well-nourished,in no acute distress; alert,appropriate and cooperative throughout examination Neck:     No deformities, masses, or tenderness noted. Lungs:     Normal respiratory effort, chest expands symmetrically. Lungs are clear to auscultation, no crackles or wheezes. Heart:     Normal rate and regular rhythm. S1 and S2 normal without gallop, murmur, click, rub or other extra sounds. Abdomen:     Bowel sounds positive,abdomen soft and non-tender without masses, organomegaly or hernias noted. Msk:     No deformity or scoliosis noted of thoracic or lumbar spine.   Pulses:     R and L carotid,radial,dorsalis pedis and posterior  tibial pulses are full and equal bilaterally Extremities:     No clubbing, cyanosis, edema, or deformity noted with normal full range of motion. Crepitus knees   Neurologic:     alert & oriented X3, gait normal, and DTRs symmetrical and normal.   Skin:     Intact without suspicious lesions or rashes Cervical Nodes:     No lymphadenopathy noted Axillary Nodes:     No palpable lymphadenopathy Psych:     memory intact for recent and remote, normally interactive, good eye contact, not anxious appearing, and not depressed appearing.      Impression & Recommendations:  Problem # 1:  FATIGUE (ICD-780.79)  Problem # 2:  FIBROMYALGIA (ICD-729.1)  Problem # 3:  IRRITABLE BOWEL SYNDROME, HX OF (ICD-V12.79)  Problem # 4:  HYPOTHYROIDISM (ICD-244.9)  Her updated medication list for this problem includes:    Levothyroxine Sodium 150 Mcg Tabs (Levothyroxine sodium) .Marland Kitchen... Take 1  tablet daily by mouth   Complete Medication List: 1)  Cymbalta 60 Mg Cpep (Duloxetine hcl) .Marland Kitchen.. 1 by mouth qd 2)  Levothyroxine Sodium 150 Mcg Tabs (Levothyroxine sodium) .... Take 1 tablet daily by mouth   Patient Instructions: 1)   Codes for fasting labs @ [facility] : 729.1, V12.79,780.79,244.9 .BMP ; vit D level;CPK;Hepatic Panel;TSH ; CBC w/ Diff . Align once daily as needed IBS. Consider Savella  for fibromyalgia after Cymbalta weaned   ]

## 2010-09-24 NOTE — Letter (Signed)
Summary: Results Follow up Letter  [facility]  [address] [place], [place] [address]   Phone: [phone]  Fax: [phone]    02/24/2008 MRN: [id number]  [name] [address] [place], [place]  [address]  Dear [name],  The following are the results of your recent test(s):  Test         Result    Pap Smear:        Normal _____  Not Normal _____ Comments: ______________________________________________________ Cholesterol: LDL(Bad cholesterol):         Your goal is less than:         HDL (Good cholesterol):       Your goal is more than: Comments:  ______________________________________________________ Mammogram:        Normal _____  Not Normal _____ Comments:  ___________________________________________________________________ Hemoccult:        Normal _____  Not normal _______ Comments:    _____________________________________________________________________ Other Tests:    We routinely do not discuss normal results over the telephone.  If you desire a copy of the results, or you have any questions about this information we can discuss them at your next office visit.   Sincerely,

## 2010-09-24 NOTE — Assessment & Plan Note (Signed)
Summary: knee pain/cbs   Vital Signs:  Patient profile:   35 year old female Weight:      142.2 pounds BMI:     23.03 Pulse rate:   68 / minute Pulse rhythm:   regular BP sitting:   90 / 60  (right arm) Cuff size:   regular  Vitals Entered By: Almeta Monas CMA Duncan Dull) (May 17, 2010 11:20 AM) CC: c/o left knee pain x1week   History of Present Illness:  Injury      This is a 35 year old woman who presents with An injury.  The symptoms began 1 week ago.  Pt started running about 9 months ago ---last weekend she ran her second 5 K and felt knee pain that progressively got worse.  Pt states she didn't twist it or fall.  The patient reports injury to the left knee.  The patient also reports tenderness.  The patient denies swelling, redness, increased warmth deformity, blood loss, numbness, weakness, loss of sensation, coolness of extremity, and loss of consciousness.    Current Medications (verified): 1)  Levothyroxine Sodium 175 Mcg Tabs (Levothyroxine Sodium) .... Take One Tablet Daily Except 1/2 Every Weds 2)  Celexa 40 Mg Tabs (Citalopram Hydrobromide) .... Daily As Directed 3)  Abilify 2 Mg Tabs (Aripiprazole) .... 1/2 Once Daily 4)  Nascobal 500 Mcg/0.38ml Soln (Cyanocobalamin) .Marland Kitchen.. 1 Intranasal Spray Weekly; Check B12 Level After 6 Months 5)  Mobic 15 Mg Tabs (Meloxicam) .... 1/2 - 1 By Mouth Once Daily As Needed Pain  Allergies (verified): 1)  ! * Tapazole 2)  ! Synthroid 3)  ! Prozac 4)  ! Wellbutrin  Past History:  Past medical, surgical, family and social histories (including risk factors) reviewed for relevance to current acute and chronic problems.  Past Medical History: Reviewed history from 02/14/2008 and no changes required. Fibromyalgia Hypothyroidism post PTU Rx for hyperthyroidism 1999 ( [place])  Past Surgical History: Reviewed history from 07/26/2007 and no changes required. C-section x 2 G2P2 Colonscopy for bleeding, IBS (2001)  Family  History: Reviewed history from 02/14/2008 and no changes required. Father: LIVING Mother: LIVING Siblings:  1 SISTER PGM:  DM Son:  ADD  Social History: Reviewed history and no changes required.  Review of Systems      See HPI  Physical Exam  General:  Well-developed,well-nourished,in no acute distress; alert,appropriate and cooperative throughout examination Msk:  L knee---no joint tenderness, no joint swelling, no joint warmth, no redness over joints, no joint deformities, and no joint instability.  pain only with weight bearing. Skin:  Intact without suspicious lesions or rashes Psych:  Cognition and judgment appear intact. Alert and cooperative with normal attention span and concentration. No apparent delusions, illusions, hallucinations   Impression & Recommendations:  Problem # 1:  KNEE PAIN, LEFT (ICD-719.46) con't knee sleeve rest, elevation ice Her updated medication list for this problem includes:    Mobic 15 Mg Tabs (Meloxicam) .Marland Kitchen... 1/2 - 1 by mouth once daily as needed pain  Discussed strengthening exercises, use of ice or heat, and medications. --- will refer to ortho if no better  Complete Medication List: 1)  Levothyroxine Sodium 175 Mcg Tabs (Levothyroxine sodium) .... Take one tablet daily except 1/2 every weds 2)  Celexa 40 Mg Tabs (Citalopram hydrobromide) .... Daily as directed 3)  Abilify 2 Mg Tabs (Aripiprazole) .... 1/2 once daily 4)  Nascobal 500 Mcg/0.31ml Soln (Cyanocobalamin) .Marland Kitchen.. 1 intranasal spray weekly; check b12 level after 6 months 5)  Mobic 15  Mg Tabs (Meloxicam) .... 1/2 - 1 by mouth once daily as needed pain  Patient Instructions: 1)  You may move around but avoid painful motions. Apply ice to sore area for 20 minutes 3-4 times a day for 2-3 days.  Prescriptions: MOBIC 15 MG TABS (MELOXICAM) 1/2 - 1 by mouth once daily as needed pain  #30 x 1   Entered and Authorized by:   Loreen Freud DO   Signed by:   Loreen Freud DO on 05/17/2010    Method used:   Electronically to        [facility] (retail)       [address]       [place], [place]  [address]       Ph: [phone]       Fax: [phone]   RxID:   [id number]

## 2010-09-24 NOTE — Progress Notes (Signed)
Summary: ? about med - dr hopper  Phone Note Call from Patient Call back at Work Phone [phone]   Caller: Patient Summary of Call: patient has a question about abilify Initial call taken by: Okey Regal [place],  August 22, 2008 10:34 AM  Follow-up for Phone Call        Left message on machine for patient to return call when avaliable, reason for call was to tell patient to leave her question Re: Abilify on machine if Im not avaliable. Follow-up by: Shonna Chock,  August 22, 2008 12:28 PM  Additional Follow-up for Phone Call Additional follow up Details #1::        SPOKE WITH PATIENT, PATIENT WAS GIVEN SAMPLES OF ABILIFY AND FINISHED-MED WORKED. NOW PATIENT WOULD LIKE RX. DR.HOPPER PLEASE ADVISE AND FORWARD TO REFIL NURSE Additional Follow-up by: Shonna Chock,  August 22, 2008 1:52 PM    Additional Follow-up for Phone Call Additional follow up Details #2::    Left msg for pt rx has been faxed to [facility] Follow-up by: Kandice Hams,  August 23, 2008 11:12 AM    Prescriptions: ABILIFY 2 MG TABS (ARIPIPRAZOLE) 1 by mouth once daily  #30 x 5   Entered by:   Kandice Hams   Authorized by:   Marga Melnick MD   Signed by:   Kandice Hams on 08/23/2008   Method used:   Faxed to ...       [facility] [phone]* (retail)       [address]       [place], [place]  [address]       Ph: [phone]       Fax: [phone]   RxID:   [id number]

## 2010-09-24 NOTE — Letter (Signed)
Summary: Results Follow up Letter  [facility] [place]  [address] [place], [place] [address]   Phone: [phone]  Fax: [phone]    04/03/2009 MRN: [id number]  [name] [address] [place], [place]  [address]  Dear [name],  The following are the results of your recent test(s):  Test         Result    Pap Smear:        Normal _____  Not Normal _____ Comments: ______________________________________________________ Cholesterol: LDL(Bad cholesterol):         Your goal is less than:         HDL (Good cholesterol):       Your goal is more than: Comments:  ______________________________________________________ Mammogram:        Normal _____  Not Normal _____ Comments:  ___________________________________________________________________ Hemoccult:        Normal _____  Not normal _______ Comments:    _____________________________________________________________________ Other Tests: Urine Cx done 03/31/09 was treated w/ appropriate meds. If you have any questions or if symptoms haven't resolved please fill free to contact.   We routinely do not discuss normal results over the telephone.  If you desire a copy of the results, or you have any questions about this information we can discuss them at your next office visit.   Sincerely,

## 2010-09-24 NOTE — Assessment & Plan Note (Signed)
Summary: rto 3 months,cbs   Vital Signs:  Patient profile:   35 year old female Weight:      156 pounds Pulse rate:   80 / minute Resp:     14 per minute BP sitting:   106 / 64  (left arm) Cuff size:   regular  Vitals Entered By: Shonna Chock (March 15, 2009 3:52 PM) CC: 3 MONTH FOLLOW-UP, PATIENT FEELS SLUGGISH-? RELATED TO ANY MEDS. IF PATIENT MISSES A DOSE OF MEDS SHE HAS A LITTLE MORE ENERGY Comments REVIEWED MED LIST, PATIENT AGREED DOSE AND INSTRUCTION CORRECT    CC:  3 MONTH FOLLOW-UP and PATIENT FEELS SLUGGISH-? RELATED TO ANY MEDS. IF PATIENT MISSES A DOSE OF MEDS SHE HAS A LITTLE MORE ENERGY.  History of Present Illness: If she misses her meds she has more energy; all meds taken together, including Synthroid. Fatigue is ongoing; weight down 8# with CVE (Pilates & cardio ) 4-5X/week X 1 hour. She is on Abilify 5 mg 1/2 once daily . New issue is  wrist pain ? related to lifting her child.  Allergies: 1)  ! * Tapazole 2)  ! Synthroid 3)  ! Prozac 4)  ! Wellbutrin  Review of Systems General:  Complains of fatigue; denies chills, fever, and sweats. Eyes:  Denies double vision and vision loss-both eyes. ENT:  Denies difficulty swallowing and hoarseness. CV:  Denies palpitations. GI:  Denies constipation and diarrhea. MS:  Pain @ ulnar L wrist ; a brace overnight helps but brace is worn out ( she has had  brace since HS following sprain). Neuro:  Denies numbness and tingling. Psych:  Denies anxiety, easily angered, easily tearful, and irritability; "Light depression". Endo:  Denies cold intolerance and heat intolerance.  Physical Exam  General:  well-nourished,in no acute distress; alert,appropriate and cooperative throughout examination Neck:  No deformities, masses, or tenderness noted. Heart:  Normal rate and regular rhythm. S1 and S2 normal without gallop, murmur, click, rub . S4 Extremities:  Slight tenderness lateral R wrist ; full ROM w/o pain Neurologic:  alert  & oriented X3 and DTRs symmetrical and normal.   Psych:  memory intact for recent and remote, normally interactive, good eye contact, not anxious appearing, and not depressed appearing.     Impression & Recommendations:  Problem # 1:  FATIGUE (ICD-780.79)  Problem # 2:  WRIST PAIN, LEFT (ZOX-096.04)  Orders: Splints- All Types (V4098)  Problem # 3:  HYPOTHYROIDISM (ICD-244.9)  Problem # 4:  B12 DEFICIENCY (ICD-266.2)  Orders: Admin of Therapeutic Inj  intramuscular or subcutaneous (11914) Vit B12 1000 mcg (J3420)  Complete Medication List: 1)  Levothyroxine Sodium 175 Mcg Tabs (levothyroxine Sodium)  .... Take 1 tablet daily by mouth except 1/2 pill q weds 2)  Accutane 60mg   .... Alternates between 1 by mouth once daily and 2 by mouth once daily 3)  Low-ogestrel 0.3-30 Mg-mcg Tabs (Norgestrel-ethinyl estradiol) .Marland Kitchen.. 1 by mouth q d 4)  Vitamin D 1000 Unit Tabs (Cholecalciferol) .Marland Kitchen.. 1 by mouth once daily 5)  Celexa 40 Mg Tabs (citalopram Hydrobromide)  .... Daily as directed 6)  Abilify 2 Mg Tabs (Aripiprazole) .... 1/2 once daily  Patient Instructions: 1)  Abilify 2 mg 1/2 once daily; this is most likely agent to cause your symptoms. Missing thyroid would certainly not increase energy  . Glucosamine sulfate 1500 mg once daily until wrist pain resolved. Do not take Thyroid with any other medication Prescriptions: ABILIFY 2 MG TABS (ARIPIPRAZOLE) 1/2 once daily  #  90 x 1   Entered and Authorized by:   Marga Melnick MD   Signed by:   Marga Melnick MD on 03/15/2009   Method used:   Print then Give to Patient   RxID:   [id number]    Medication Administration  Injection # 1:    Medication: Vit B12 1000 mcg    Diagnosis: B12 DEFICIENCY (ICD-266.2)    Route: IM    Site: R deltoid    Exp Date: 07/2010    Lot #: [id number]    Mfr: American Regent    Patient tolerated injection without complications    Given by: Shonna Chock (March 15, 2009 5:00 PM)  Orders Added: 1)  Est.  Patient Level III [29562] 2)  Splints- All Types [A4570] 3)  Admin of Therapeutic Inj  intramuscular or subcutaneous [96372] 4)  Vit B12 1000 mcg [J3420]

## 2010-09-24 NOTE — Progress Notes (Signed)
Summary: lm pt needs lab appt- dr Emaad Nanna  Phone Note Outgoing Call   Summary of Call: lm for patient to call - lab appt - Vit D level & TSH @ next B12 shot lab appt.(281.0,vit D deficiency) Initial call taken by: Okey Regal [place],  July 25, 2008 3:10 PM  Follow-up for Phone Call        done Follow-up by: Marga Melnick MD,  July 30, 2008 2:51 PM

## 2010-09-24 NOTE — Assessment & Plan Note (Signed)
Summary: 1st b12 injection, need to scheduled 1x weekly/SCM   Nurse Visit    Prior Medications: LEVOTHYROXINE SODIUM 150 MCG TABS (LEVOTHYROXINE SODIUM) take 1 tablet daily by mouth SAVELLA 50 MG  TABS (MILNACIPRAN HCL) 1 by mouth two times a day AMOXIL 500 MG  CAPS (AMOXICILLIN) 1 three times a day Current Allergies: ! * TAPAZOLE ! SYNTHROID ! PROZAC ! [facility]    Medication Administration  Injection # 1:    Medication: Vit B12 1000 mcg    Diagnosis: B12 DEFICIENCY (ICD-266.2)    Route: IM    Site: L deltoid    Exp Date: 10/2009    Lot #: [id number]    Mfr: American Regent    Patient tolerated injection without complications    Given by: Floydene Flock CMA (March 17, 2008 8:32 AM)  Orders Added: 1)  Admin of Therapeutic Inj  intramuscular or subcutaneous [96372] 2)  Vit B12 1000 mcg [place]    ]  Medication Administration  Injection # 1:    Medication: Vit B12 1000 mcg    Diagnosis: B12 DEFICIENCY (ICD-266.2)    Route: IM    Site: L deltoid    Exp Date: 10/2009    Lot #: [id number]    Mfr: American Regent    Patient tolerated injection without complications    Given by: Floydene Flock CMA (March 17, 2008 8:32 AM)  Orders Added: 1)  Admin of Therapeutic Inj  intramuscular or subcutaneous [96372] 2)  Vit B12 1000 mcg [J3420]

## 2010-09-24 NOTE — Letter (Signed)
Summary: Results Follow up Letter  [facility]  [address] [place], [place] [address]   Phone: [phone]  Fax: [phone]    07/26/2007 MRN: [id number]  [name] [address] [place], [place]  [address]  Dear [name],  The following are the results of your recent test(s):  Test         Result    Pap Smear:        Normal _____  Not Normal _____ Comments: ______________________________________________________ Cholesterol: LDL(Bad cholesterol):         Your goal is less than:         HDL (Good cholesterol):       Your goal is more than: Comments:  ______________________________________________________ Mammogram:        Normal _____  Not Normal _____ Comments:  ___________________________________________________________________ Hemoccult:        Normal _____  Not normal _______ Comments:    _____________________________________________________________________ Other Tests:   Please see attached results and comments  We routinely do not discuss normal results over the telephone.  If you desire a copy of the results, or you have any questions about this information we can discuss them at your next office visit.   Sincerely,

## 2010-09-24 NOTE — Progress Notes (Signed)
Summary: dizzy  Phone Note Call from Patient Call back at Work Phone [phone]   Caller: Patient Summary of Call: Dr.Hopper Patient has started a new medication which is Savella for her fibromyalgia. She took a pill yesterday. But today she is very dizzy, patient feels if she closes her eyes that she would fall over. Patient also feels as if she has a fever.  Initial call taken by: Charolette Child,  March 02, 2008 2:13 PM  Follow-up for Phone Call        Spoke with patient, yesterday she took her first pill of new med. Now patient is feeling dizzy and thinks she may have a fever, not certian about fever cause she is at work and unable to check temp. Patient said she doesnt know if these are side effects or if she is coming down with something different like a sinus Infection. Dr.Hopper Please Advise. Follow-up by: Shonna Chock,  March 02, 2008 2:56 PM  Additional Follow-up for Phone Call Additional follow up Details #1::        Spoke with pt informed per Dr Alwyn Ren d/c medciation and monitor  for facial pain,purulence,fever pt agreed.Kandice Hams  March 02, 2008 4:24 PM  Additional Follow-up by: Kandice Hams,  March 02, 2008 4:24 PM

## 2010-09-24 NOTE — Assessment & Plan Note (Signed)
Summary: follow up/cbs   Vital Signs:  Patient profile:   35 year old female Weight:      147.6 pounds Pulse rate:   72 / minute Resp:     15 per minute BP sitting:   108 / 70  (left arm) Cuff size:   regular  Vitals Entered By: Shonna Chock CMA (April 03, 2010 1:10 PM) CC: Follow-up visit: refill meds, thyroid check and sluggish x couple weeks (patient would like additional labs), Fatigue   CC:  Follow-up visit: refill meds, thyroid check and sluggish x couple weeks (patient would like additional labs), and Fatigue.  History of Present Illness:  Fatigue      This is a 35 year old woman who presents with Fatigue for 3 + weeks.  The patient reports intermittent fatigue, fatigue  even without physical activity ,both  motivational fatigue  and physical fatigue.  The patient also reports weight loss of 20 # with low carb & CVE as running .  The patient denies fever, night sweats, exertional chest pain, dyspnea, cough, hemoptysis, and new medications.  The patient denies the following symptoms: leg swelling, orthopnea, PND, melena, adenopathy, severe snoring, daytime sleepiness, and skin changes.  The patient denies anhedonia, feeling depressed, altered appetite, and poor sleep except some difficulty going to sleep & awakening between 2-3 am for BR.  PMH of B12 deficiency; off injections 12 months.  Current Medications (verified): 1)  Levothyroxine Sodium 175 Mcg Tabs (Levothyroxine Sodium) .... Take One Tablet Daily Except 1/2 Every Weds 2)  Celexa 40 Mg Tabs (Citalopram Hydrobromide) .... Daily As Directed 3)  Abilify 2 Mg Tabs (Aripiprazole) .... 1/2 Once Daily  Allergies: 1)  ! * Tapazole 2)  ! Synthroid 3)  ! Prozac 4)  ! Wellbutrin  Review of Systems General:  Denies chills. Eyes:  Denies blurring, double vision, and vision loss-both eyes. ENT:  Denies difficulty swallowing and hoarseness. CV:  Denies palpitations. Resp:  Denies morning headaches. GI:  Denies abdominal  pain, bloody stools, constipation, diarrhea, and indigestion. MS:  Denies joint pain, joint redness, joint swelling, and muscle aches. Derm:  Denies changes in nail beds and hair loss. Neuro:  Denies numbness and tingling. Psych:  Denies anxiety, easily angered, easily tearful, and irritability; Citalopram & Abilify of benefit. No counseling. Endo:  Complains of cold intolerance; denies excessive hunger, excessive thirst, excessive urination, and heat intolerance.  Physical Exam  General:  well-nourished,in no acute distress; alert,appropriate and cooperative throughout examination Eyes:  No corneal or conjunctival inflammation noted. Perrla.No icterus or lid lag Neck:  No deformities, masses, or tenderness noted. No nodules Lungs:  Normal respiratory effort, chest expands symmetrically. Lungs are clear to auscultation, no crackles or wheezes. Heart:  Normal rate and regular rhythm. S1 and S2 normal without gallop, murmur, click, rub or other extra sounds. Abdomen:  Bowel sounds positive,abdomen soft and non-tender without masses, organomegaly or hernias noted. Pulses:  R and L carotid,radial,dorsalis pedis and posterior tibial pulses are full and equal bilaterally Extremities:  No clubbing, cyanosis, edema. Neurologic:  alert & oriented X3, strength normal in all extremities, and DTRs symmetrical and normal.  No tremor  Skin:  Intact without suspicious lesions or rashes. No jaundice Cervical Nodes:  No lymphadenopathy noted Axillary Nodes:  No palpable lymphadenopathy Psych:  memory intact for recent and remote, normally interactive, good eye contact, not anxious appearing, and not depressed appearing.     Impression & Recommendations:  Problem # 1:  FATIGUE (ICD-780.79)  Orders: Venipuncture (47425) TLB-CBC Platelet - w/Differential (85025-CBCD)  Problem # 2:  B12 DEFICIENCY (ICD-266.2)  PMH of  Orders: TLB-B12, Serum-Total ONLY (95638-V56)  Problem # 3:  FIBROMYALGIA  (ICD-729.1)  Quiescent  Orders: Venipuncture (43329)  Problem # 4:  HYPOTHYROIDISM (ICD-244.9)  Her updated medication list for this problem includes:    Levothyroxine Sodium 175 Mcg Tabs (Levothyroxine sodium) .Marland Kitchen... Take one tablet daily except 1/2 every weds  Orders: Venipuncture (51884) TLB-TSH (Thyroid Stimulating Hormone) (84443-TSH)  Complete Medication List: 1)  Levothyroxine Sodium 175 Mcg Tabs (Levothyroxine sodium) .... Take one tablet daily except 1/2 every weds 2)  Celexa 40 Mg Tabs (Citalopram hydrobromide) .... Daily as directed 3)  Abilify 2 Mg Tabs (Aripiprazole) .... 1/2 once daily  Patient Instructions: 1)  Additional recommendations will be made on basis of lab results. Prescriptions: ABILIFY 2 MG TABS (ARIPIPRAZOLE) 1/2 once daily Brand medically necessary #30 x 5   Entered and Authorized by:   Marga Melnick MD   Signed by:   Marga Melnick MD on 04/03/2010   Method used:   Print then Give to Patient   RxID:   [id number] CELEXA 40 MG TABS (CITALOPRAM HYDROBROMIDE) daily as directed  #90 Tablet x 3   Entered and Authorized by:   Marga Melnick MD   Signed by:   Marga Melnick MD on 04/03/2010   Method used:   Print then Give to Patient   RxID:   [id number] LEVOTHYROXINE SODIUM 175 MCG TABS (LEVOTHYROXINE SODIUM) take one tablet daily EXCEPT 1/2 every weds  #90 x 3   Entered and Authorized by:   Marga Melnick MD   Signed by:   Marga Melnick MD on 04/03/2010   Method used:   Print then Give to Patient   RxID:   [id number]   Appended Document: follow up/cbs

## 2010-09-24 NOTE — Assessment & Plan Note (Signed)
Summary: b12 & flu shot/cbs   Nurse Visit    Prior Medications: LEVOTHYROXINE SODIUM 150 MCG TABS (LEVOTHYROXINE SODIUM) take 1 tablet daily by mouth SAVELLA 50 MG  TABS (MILNACIPRAN HCL) 1 by mouth two times a day ACCUTANE 60MG  () 1 by mouth once daily CITALOPRAM HYDROBROMIDE 20 MG TABS (CITALOPRAM HYDROBROMIDE) 1 once daily Current Allergies: ! * TAPAZOLE ! SYNTHROID ! PROZAC ! [facility]  Flu Vaccine Consent Questions     Do you have a history of severe allergic reactions to this vaccine? no    Any prior history of allergic reactions to egg and/or gelatin? no    Do you have a sensitivity to the preservative Thimersol? no    Do you have a past history of Guillan-Barre Syndrome? no    Do you currently have an acute febrile illness? no    Have you ever had a severe reaction to latex? no    Vaccine information given and explained to patient? yes    Are you currently pregnant? no    Lot Number:[id number]   Exp Date:02/21/2009   Site Given  Left Deltoid IM    Medication Administration  Injection # 1:    Medication: Vit B12 1000 mcg    Diagnosis: B12 DEFICIENCY (ICD-266.2)    Route: IM    Site: R deltoid    Exp Date: 03/25/2010    Lot #: [id number]    Mfr: American Regent    Patient tolerated injection without complications    Given by: Candie Echevaria CMA (June 19, 2008 12:47 PM)  Orders Added: 1)  Admin of Therapeutic Inj  intramuscular or subcutaneous [96372] 2)  Vit B12 1000 mcg [J3420] 3)  Admin 1st Vaccine [90471] 4)  Flu Vaccine 8yrs + [place]ew    ]

## 2010-09-24 NOTE — Progress Notes (Signed)
Summary: ? about having an appt  Phone Note Call from Patient   Caller: Patient Summary of Call: Dr.Criag Wicklund  Call Back  [phone]  or [phone]  Pt wants to know if she needs an appt:::: not feeling good she thinks its the medication he put her on a month ago.   wants to talk to Dr.Anida Deol Initial call taken by: Vanessa [place],  April 17, 2008 1:25 PM  Follow-up for Phone Call        LEFT MESSAGE ON MACHINE, REASON FOR CALL WAS TO SEE WHAT SYMPTOMS PATIENT IS HAVING AND CONFIRM THAT SHE IS REFERRING TO SAVELLA(MEDICATION) Follow-up by: Shonna Chock,  April 17, 2008 1:59 PM  Additional Follow-up for Phone Call Additional follow up Details #1::        SPOKE WITH PATIENT- ? SAVELLA CAUSING HER TO BE VERY EMOTIONAL, THEN CRANKY, SHORT TEMPERED. 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 PATIENT WOULD LIKE TO KNOW IF THIS IS GOING TO GET BETTER OR SHOULD SHE COME OFF MEDICATION? DR.Blaike Vickers PLEASE ADVISE. Additional Follow-up by: Shonna Chock,  April 17, 2008 2:15 PM    Additional Follow-up for Phone Call Additional follow up Details #2::    the medication should be decreased to 1 once daily X 4 days then 1/2 once daily X 4 days discontinued Follow-up by: Marga Melnick MD,  April 17, 2008 6:07 PM  Additional Follow-up for Phone Call Additional follow up Details #3:: Details for Additional Follow-up Action Taken: Spoke with patient, patient wasn't ok with instruction. Patient requested to speak with Dr.Ermine Spofford, Per Dr.Ebrahim Deremer call patient and tell her to make an appointment./[facility]  April 18, 2008 3:17 PM

## 2010-09-24 NOTE — Assessment & Plan Note (Signed)
Summary: DISCUSS MED /CBS   Vital Signs:  Patient Profile:   35 Years Old Female Weight:      155.0 pounds Temp:     98.5 degrees F oral Pulse rate:   94 / minute Resp:     14 per minute BP sitting:   124 / 80  (left arm) Cuff size:   large  Vitals Entered By: Shonna Chock (April 19, 2008 4:39 PM)                 Chief Complaint:  DISCUSS MEDICATION-SAVELLA.  History of Present Illness: Overall energy  level is up, 50% better, a"significant increase", but she is unsure whether it is B12 shots & vit D supplement or Savella. Also fibromyalgia/muscle symptoms improved on Savella . "I am moody, emotionally unstable" On Cymbalta moods were improved  but fibromyalgia symptoms were worse.    Current Allergies (reviewed today): ! * TAPAZOLE ! SYNTHROID ! PROZAC ! [facility]      Physical Exam  General:     in no acute distress; alert,appropriate and cooperative throughout examination but frustrated Psych:     Oriented X3, memory intact for recent and remote, normally interactive, good eye contact, and slightly anxious.      Impression & Recommendations:  Problem # 1:  FIBROMYALGIA (ICD-729.1)  Problem # 2:  FATIGUE (ICD-780.79)  Problem # 3:  ANXIETY STATE, UNSPECIFIED (ICD-300.00)  Her updated medication list for this problem includes:    Citalopram Hydrobromide 20 Mg Tabs (Citalopram hydrobromide) .Marland Kitchen... 1 once daily   Complete Medication List: 1)  Levothyroxine Sodium 150 Mcg Tabs (Levothyroxine sodium) .... Take 1 tablet daily by mouth 2)  Savella 50 Mg Tabs (Milnacipran hcl) .Marland Kitchen.. 1 by mouth two times a day 3)  Accutane 60mg   .... 1 by mouth once daily 4)  Citalopram Hydrobromide 20 Mg Tabs (Citalopram hydrobromide) .Marland Kitchen.. 1 once daily   Patient Instructions: 1)  Journal to assess response.   Prescriptions: CITALOPRAM HYDROBROMIDE 20 MG TABS (CITALOPRAM HYDROBROMIDE) 1 once daily  #30 x 5   Entered and Authorized by:   Marga Melnick MD   Signed by:    Marga Melnick MD on 04/19/2008   Method used:   Print then Give to Patient   RxID:   [id number]  ]

## 2010-09-24 NOTE — Assessment & Plan Note (Signed)
Summary: b-12 dx 266.2 [name]   Nurse Visit     Allergies: 1)  ! * Tapazole 2)  ! Synthroid 3)  ! Prozac 4)  ! Wellbutrin     Medication Administration  Injection # 1:    Medication: Vit B12 1000 mcg    Diagnosis: B12 DEFICIENCY (ICD-266.2)    Route: IM    Site: R deltoid    Exp Date: 07/2010    Lot #: [id number]    Mfr: American Regent    Patient tolerated injection without complications    Given by: Floydene Flock CMA (Jan 02, 2009 10:30 AM)  Orders Added: 1)  Admin of Therapeutic Inj  intramuscular or subcutaneous [96372] 2)  Vit B12 1000 mcg [J3420]      Medication Administration  Injection # 1:    Medication: Vit B12 1000 mcg    Diagnosis: B12 DEFICIENCY (ICD-266.2)    Route: IM    Site: R deltoid    Exp Date: 07/2010    Lot #: [id number]    Mfr: American Regent    Patient tolerated injection without complications    Given by: Floydene Flock CMA (Jan 02, 2009 10:30 AM)  Orders Added: 1)  Admin of Therapeutic Inj  intramuscular or subcutaneous [96372] 2)  Vit B12 1000 mcg [J3420]

## 2010-09-24 NOTE — Assessment & Plan Note (Signed)
Summary: inj-b-12 and vit d shot/ph   Nurse Visit    Prior Medications: LEVOTHYROXINE SODIUM 150 MCG TABS (LEVOTHYROXINE SODIUM) take 1 tablet daily by mouth SAVELLA 50 MG  TABS (MILNACIPRAN HCL) 1 by mouth two times a day ACCUTANE 60MG  () ALTERNATES BETWEEN 1 by mouth once daily AND 2 by mouth once daily CITALOPRAM HYDROBROMIDE 20 MG TABS (CITALOPRAM HYDROBROMIDE) 1 once daily Current Allergies: ! * TAPAZOLE ! SYNTHROID ! PROZAC ! [facility]    Medication Administration  Injection # 1:    Medication: Vit B12 1000 mcg    Diagnosis: B12 DEFICIENCY (ICD-266.2)    Route: IM    Site: R deltoid    Exp Date: 02/2010    Lot #: [id number]    Mfr: American Regent    Patient tolerated injection without complications    Given by: Floydene Flock CMA (July 27, 2008 1:26 PM)  Orders Added: 1)  Venipuncture [81191] 2)  TLB-TSH (Thyroid Stimulating Hormone) [84443-TSH] 3)  T-Vitamin D (25-Hydroxy) [47829-56213] 4)  Vit B12 1000 mcg [J3420] 5)  Admin of Therapeutic Inj  intramuscular or subcutaneous Lepidus.[place]nam    ]  Medication Administration  Injection # 1:    Medication: Vit B12 1000 mcg    Diagnosis: B12 DEFICIENCY (ICD-266.2)    Route: IM    Site: R deltoid    Exp Date: 02/2010    Lot #: [id number]    Mfr: American Regent    Patient tolerated injection without complications    Given by: Floydene Flock CMA (July 27, 2008 1:26 PM)  Orders Added: 1)  Venipuncture [78469] 2)  TLB-TSH (Thyroid Stimulating Hormone) [84443-TSH] 3)  T-Vitamin D (25-Hydroxy) [62952-84132] 4)  Vit B12 1000 mcg [J3420] 5)  Admin of Therapeutic Inj  intramuscular or subcutaneous [44010]

## 2010-09-24 NOTE — Progress Notes (Signed)
Summary: POSSIBLE SINUS INFECTION  Phone Note Call from Patient   Caller: Patient Summary of Call: SPOKE WITH PATIENT, STILL DIZZY, FEVER 100.0, CONGESTION.  PATIENT THINKS SHE HAS SINUS INFECTION AND SYMPTOMS NOT RELATED TO MEDICATION (SEE PHONE NOTE FROM YESTERDAY). PATIENT TO BE SEEN TODAY @ 12:00PM Initial call taken by: Shonna Chock,  March 03, 2008 9:38 AM
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
## 2010-09-24 NOTE — Assessment & Plan Note (Signed)
Summary: uti/pressure,frequency/alr   Vital Signs:  Patient profile:   35 year old female Weight:      158.6 pounds Temp:     97.6 degrees F oral Pulse rate:   70 / minute BP sitting:   102 / 68  (left arm) Cuff size:   regular  Vitals Entered By: Shonna Chock (March 30, 2009 2:32 PM) CC: PRESSURE WHEN URINATING AND FREQUENCY OFF/ON X 1 WEEK. PATIENT'S SYMPTOMS MORE INTENSE X 24HOURS Comments REVIEWED MED LIST, PATIENT AGREED DOSE AND INSTRUCTION CORRECT    History of Present Illness: 35 yo woman here today for pressure w/ urinating.  sxs started 1 week ago.  sxs were intermittant until this AM.  + frequency, hestitancy, dysuria.  no blood.  no back pain or fevers.  2 nights ago w/ abdominal cramping, loose stools.  resolved the next day.  Allergies: 1)  ! * Tapazole 2)  ! Synthroid 3)  ! Prozac 4)  ! Wellbutrin  Review of Systems      See HPI  Physical Exam  General:  well-nourished,in no acute distress; alert,appropriate and cooperative throughout examination Abdomen:  mild suprapubic tenderness, no CVA tenderness   Impression & Recommendations:  Problem # 1:  UTI (ICD-599.0) Assessment New pt's sxs and UA consistent w/ UTI.  start Keflex.  reviewed supportive care and red flags that should prompt return.  Pt expresses understanding and is in agreement w/ this plan. Her updated medication list for this problem includes:    Cephalexin 500 Mg Tabs (Cephalexin) .Marland Kitchen... Take one by mouth two times a day x7 days.  take w/ food.  Orders: UA Dipstick w/o Micro (manual) ([address]) T-Culture, Urine ([id number])  Complete Medication List: 1)  Levothyroxine Sodium 175 Mcg Tabs (levothyroxine Sodium)  .... Take 1 tablet daily by mouth except 1/2 pill q weds 2)  Accutane 60mg   .... Alternates between 1 by mouth once daily and 2 by mouth once daily 3)  Low-ogestrel 0.3-30 Mg-mcg Tabs (Norgestrel-ethinyl estradiol) .Marland Kitchen.. 1 by mouth q d 4)  Vitamin D 1000 Unit Tabs (Cholecalciferol)  .Marland Kitchen.. 1 by mouth once daily 5)  Celexa 40 Mg Tabs (citalopram Hydrobromide)  .... Daily as directed 6)  Abilify 2 Mg Tabs (Aripiprazole) .... 1/2 once daily 7)  Cephalexin 500 Mg Tabs (Cephalexin) .... Take one by mouth two times a day x7 days.  take w/ food.  Patient Instructions: 1)  Please schedule a follow-up appointment as needed 2)  Take the antibiotic as directed 3)  Drink plenty of fluids to flush the system 4)  Take over the counter AZO if needed for the burning with urination 5)  Call with any questions or concerns 6)  Have a great weekend!  Prescriptions: CEPHALEXIN 500 MG  TABS (CEPHALEXIN) take one by mouth two times a day x7 days.  take w/ food.  #14 x 0   Entered and Authorized by:   Neena Rhymes MD   Signed by:   Neena Rhymes MD on 03/30/2009   Method used:   Electronically to        [facility] (retail)       [address]       [place], [place]  [address]       Ph: [phone]       Fax: [phone]   RxID:   [id number] CEPHALEXIN 500 MG  TABS (CEPHALEXIN) take one by mouth two times a day x7 days.  take w/ food.  #14 x 0   Entered and Authorized by:   Neena Rhymes MD   Signed by:   Neena Rhymes MD on 03/30/2009   Method used:   [facility]ically to        [facility] [phone]* (retail)       [address]       [place], [place]  [address]       Ph: [phone]       Fax: [phone]   RxID:   [id number]   Laboratory Results   Urine Tests    Routine Urinalysis   Color: straw Appearance: Clear Glucose: negative   (Normal Range: Negative) Bilirubin: negative   (Normal Range: Negative) Ketone: negative   (Normal Range: Negative) Spec. Gravity: 1.010   (Normal Range: 1.003-1.035) Blood: moderate   (Normal Range: Negative) pH: 6.0   (Normal Range: 5.0-8.0) Protein: negative   (Normal Range: Negative) Urobilinogen: 0.2   (Normal Range: 0-1) Nitrite: negative    (Normal Range: Negative) Leukocyte Esterace: moderate   (Normal Range: Negative)    Comments: SENT FOR CULTURE MODERATE BLOOD NOT RELATED TO MENSTURAL

## 2010-09-24 NOTE — Progress Notes (Signed)
Summary: need lab order  Phone Note Call from Patient   Summary of Call: has lab office visit 9/17 - needs order - chart in your box ..................................................................Marland KitchenShary Decamp  May 05, 2007 4:16 PM   Follow-up for Phone Call        TSH 244.9 Follow-up by: Marga Melnick MD,  May 05, 2007 7:48 PM

## 2010-09-24 NOTE — Assessment & Plan Note (Signed)
Summary: b-12/cbs   Nurse Visit    Prior Medications: LEVOTHYROXINE SODIUM 175 MCG TABS (LEVOTHYROXINE SODIUM) () take 1 tablet daily by mouth SAVELLA 50 MG  TABS (MILNACIPRAN HCL) 1 by mouth two times a day ACCUTANE 60MG  () ALTERNATES BETWEEN 1 by mouth once daily AND 2 by mouth once daily ABILIFY 2 MG TABS (ARIPIPRAZOLE) 1 by mouth once daily LOW-OGESTREL 0.3-30 MG-MCG TABS (NORGESTREL-ETHINYL ESTRADIOL) 1 by mouth Q D Current Allergies: ! * TAPAZOLE ! SYNTHROID ! PROZAC ! [facility]    Medication Administration  Injection # 1:    Medication: Vit B12 1000 mcg    Diagnosis: B12 DEFICIENCY (ICD-266.2)    Route: IM    Site: L deltoid    Exp Date: 04/2010    Lot #: [id number]    Mfr: American Regent    Patient tolerated injection without complications    Given by: Floydene Flock CMA (August 22, 2008 1:29 PM)  Orders Added: 1)  Admin of Therapeutic Inj  intramuscular or subcutaneous [96[address]] 2)  Vit B12 1000 mcg [email or web address]    ]  Medication Administration  Injection # 1:    Medication: Vit B12 1000 mcg    Diagnosis: B12 DEFICIENCY (ICD-266.2)    Route: IM    Site: L deltoid    Exp Date: 04/2010    Lot #: [id number]    Mfr: American Regent    Patient tolerated injection without complications    Given by: Floydene Flock CMA (August 22, 2008 1:29 PM)  Orders Added: 1)  Admin of Therapeutic Inj  intramuscular or subcutaneous [96372] 2)  Vit B12 1000 mcg [J3420]

## 2010-09-24 NOTE — Assessment & Plan Note (Signed)
Summary: SPOT ON HER HIP//PH   Vital Signs:  Patient profile:   35 year old female LMP:     06/25/2009 Height:      66 inches Weight:      160 pounds BMI:     25.92 Temp:     98.1 degrees F oral Pulse rate:   54 / minute Pulse rhythm:   regular Resp:     12 per minute BP sitting:   108 / 70  (left arm) Cuff size:   large  Vitals Entered By: Arman Filter (July 13, 2009 10:14 AM)  CC:  c/o spot on hip.  History of Present Illness: [name] is 35 year old female who presents today following a fall.  Patient was stepping across the opening in her attic and slipped, striking her hip on saturday.  Since that time she has developed pain in the tissue on her left hip along with several puncture marks.    Has throbbing pain in her left hip shooting down side of her leg since that time.    Allergies: 1)  ! * Tapazole 2)  ! Synthroid 3)  ! Prozac 4)  ! Wellbutrin  Review of Systems       Denies fever,  + Left hip pain  Physical Exam  General:  Well-developed,well-nourished,in no acute distress; alert,appropriate and cooperative throughout examination Lungs:  Normal respiratory effort, chest expands symmetrically. Lungs are clear to auscultation, no crackles or wheezes. Heart:  Normal rate and regular rhythm. S1 and S2 normal without gallop, murmur, click, rub or other extra sounds. Skin:  several punctate wounds on left thigh- some mild erythema surrounding each wound   Impression & Recommendations:  Problem # 1:  ABRASION, LEG, INFECTED (ICD-916.9) will give tetanus today and treat with keflex.  Pt to return if symptoms do not improve.  Complete Medication List: 1)  Levothyroxine Sodium 175 Mcg Tabs (Levothyroxine sodium) .... Take one tablet daily except 1/2 every weds 2)  Vitamin D 1000 Unit Tabs (Cholecalciferol) .Marland Kitchen.. 1 by mouth once daily 3)  Celexa 40 Mg Tabs (Citalopram hydrobromide) .... Daily as directed 4)  Abilify 2 Mg Tabs (Aripiprazole) .... 1/2 once daily 5)   Ferrous Sulfate 83 Mg Tabs (Ferrous sulfate) .Marland Kitchen.. 1 by mouth 1 per day 6)  Sm Glucosamine Sulfate 1000 Mg Tabs (Glucosamine sulfate) .Marland Kitchen.. 1 tab by mouth once a day 7)  Keflex 500 Mg Caps (Cephalexin) .... One tablet by mouth two times a day x 7 days  Patient Instructions: 1)  please return if you develop worsening pain, redness , swelling or fever Prescriptions: KEFLEX 500 MG CAPS (CEPHALEXIN) one tablet by mouth two times a day x 7 days  #14 x 0   Entered and Authorized by:   Lemont Fillers FNP   Signed by:   Lemont Fillers FNP on 07/13/2009   Method used:   Electronically to        [facility] (retail)       [address]       [place], [place]  [address]       Ph: [phone]       Fax: [phone]   RxID:   [id number]   Current Allergies (reviewed today): ! * TAPAZOLE ! SYNTHROID ! PROZAC ! [facility] Updated/Current Medications (including changes made in today's visit):  LEVOTHYROXINE SODIUM 175 MCG TABS (LEVOTHYROXINE SODIUM) take one tablet daily EXCEPT 1/2 every weds  VITAMIN D 1000 UNIT TABS (CHOLECALCIFEROL) 1 by mouth once daily CELEXA 40 MG TABS (CITALOPRAM HYDROBROMIDE) daily as directed ABILIFY 2 MG TABS (ARIPIPRAZOLE) 1/2 once daily FERROUS SULFATE 83 MG TABS (FERROUS SULFATE) 1 by mouth 1 per day SM GLUCOSAMINE SULFATE 1000 MG TABS (GLUCOSAMINE SULFATE) 1 tab by mouth once a day KEFLEX 500 MG CAPS (CEPHALEXIN) one tablet by mouth two times a day x 7 days      Appended Document: SPOT ON HER HIP//PH Full ROM noted left hip.  Appended Document: SPOT ON HER HIP//PH     Clinical Lists Changes  Orders: Added new Service order of Tdap => 54yrs IM ([address]) - Signed Added new Service order of Admin 1st Vaccine ([address]) - Signed Observations: Added new observation of TD BOOSTERLO: [id number] (07/13/2009 10:59) Added new observation of TD BOOST EXP: 02/28/2011 (07/13/2009 10:59) Added new observation of TD  BOOSTERBY: Stacia Hawks (07/13/2009 10:59) Added new observation of TD BOOSTERRT: IM (07/13/2009 10:59) Added new observation of TDBOOSTERDSE: 0.5 ml (07/13/2009 10:59) Added new observation of TD BOOST SIT: right deltoid (07/13/2009 10:59) Added new observation of TD BOOSTER: Tdap (07/13/2009 10:59)       Immunizations Administered:  Tetanus Vaccine:    Vaccine Type: Tdap    Site: right deltoid    Dose: 0.5 ml    Route: IM    Given by: Shary Decamp    Exp. Date: 02/28/2011    Lot #: [id number]

## 2010-09-24 NOTE — Assessment & Plan Note (Signed)
Summary: SORE THROAT RUNNY NOSE/ALR   Vital Signs:  Patient profile:   35 year old female Weight:      160 pounds Temp:     99.0 degrees F oral BP sitting:   116 / 80  (left arm)  Vitals Entered By: Doristine Devoid (April 25, 2009 1:14 PM) CC: sinus pressure and congestion along w/ sore throat from drainage   History of Present Illness: 35 yo woman here today for sinus pressure and congestion.  + ear pain due to pressure.  swollen glands.  + sore throat and painful swallowing.  + nasal congestion.  + cough- non productive.  + sneezing.  no fevers, no known sick contacts.  sxs started 7-10 days ago.  Current Medications (verified): 1)  Levothyroxine Sodium 175 Mcg Tabs (Levothyroxine Sodium) .... Take One Tablet Daily Except 1/2 Every Weds 2)  Vitamin D 1000 Unit Tabs (Cholecalciferol) .Marland Kitchen.. 1 By Mouth Once Daily 3)  Celexa 40 Mg Tabs (Citalopram Hydrobromide) .... Daily As Directed 4)  Abilify 2 Mg Tabs (Aripiprazole) .... 1/2 Once Daily 5)  Amoxicillin 500 Mg Tabs (Amoxicillin) .... 2 Tabs By Mouth Two Times A Day X10 Days  Allergies (verified): 1)  ! * Tapazole 2)  ! Synthroid 3)  ! Prozac 4)  ! Wellbutrin  Review of Systems      See HPI  Physical Exam  General:  Well-developed,well-nourished,in no acute distress; alert,appropriate and cooperative throughout examination Head:  + TTP over frontal and maxillary sinuses Eyes:  no injxn or inflammation Ears:  External ear exam shows no significant lesions or deformities.  Otoscopic examination reveals clear canals, tympanic membranes are intact bilaterally without bulging, retraction, inflammation or discharge. Hearing is grossly normal bilaterally. Nose:  edematous turbinates Mouth:  + PND Lungs:  Normal respiratory effort, chest expands symmetrically. Lungs are clear to auscultation, no crackles or wheezes. Heart:  Normal rate and regular rhythm. S1 and S2 normal without gallop, murmur, click, rub . S4 Cervical Nodes:  No  lymphadenopathy noted   Impression & Recommendations:  Problem # 1:  SINUSITIS - ACUTE-NOS (ICD-461.9) Assessment New pt w/ frontal and maxillary sinusitis.  start amox.  reviewed supportive care and red flags that should prompt return.  Pt expresses understanding and is in agreement w/ this plan. Her updated medication list for this problem includes:    Amoxicillin 500 Mg Tabs (Amoxicillin) .Marland Kitchen... 2 tabs by mouth two times a day x10 days  Complete Medication List: 1)  Levothyroxine Sodium 175 Mcg Tabs (Levothyroxine sodium) .... Take one tablet daily except 1/2 every weds 2)  Vitamin D 1000 Unit Tabs (Cholecalciferol) .Marland Kitchen.. 1 by mouth once daily 3)  Celexa 40 Mg Tabs (Citalopram hydrobromide) .... Daily as directed 4)  Abilify 2 Mg Tabs (Aripiprazole) .... 1/2 once daily 5)  Amoxicillin 500 Mg Tabs (Amoxicillin) .... 2 tabs by mouth two times a day x10 days  Patient Instructions: 1)  Take the amoxicillin as directed for your sinus infection 2)  Tylenol/Ibuprofen as needed for pain or fever 3)  Mucinex DM to thin congestion and suppress cough 4)  Start Claritin or Zyrtec daily for your allergies 5)  Drink plenty of fluids 6)  Hang in there! Prescriptions: AMOXICILLIN 500 MG TABS (AMOXICILLIN) 2 tabs by mouth two times a day x10 days  #40 x 0   Entered and Authorized by:   Neena Rhymes MD   Signed by:   Neena Rhymes MD on 04/25/2009   Method used:  Electronically to        [facility] (retail)       [address]       [place], [place]  [address]       Ph: [phone]       Fax: [phone]   RxID:   [id number] AMOXICILLIN 500 MG TABS (AMOXICILLIN) 2 tabs by mouth two times a day x10 days  #40 x 0   Entered and Authorized by:   Neena Rhymes MD   Signed by:   Neena Rhymes MD on 04/25/2009   Method used:   [facility]        [facility] [phone][facility] (retail)       [address]       [place], [place]  [address]       Ph: [phone]       Fax: [phone]   RxID:   [id number]

## 2010-10-03 ENCOUNTER — Other Ambulatory Visit: Payer: Self-pay | Admitting: Family Medicine

## 2010-10-03 ENCOUNTER — Ambulatory Visit (INDEPENDENT_AMBULATORY_CARE_PROVIDER_SITE_OTHER): Payer: 59 | Admitting: Family Medicine

## 2010-10-03 ENCOUNTER — Encounter: Payer: Self-pay | Admitting: Family Medicine

## 2010-10-03 DIAGNOSIS — E039 Hypothyroidism, unspecified: Secondary | ICD-10-CM

## 2010-10-03 DIAGNOSIS — J019 Acute sinusitis, unspecified: Secondary | ICD-10-CM | POA: Insufficient documentation

## 2010-10-03 LAB — T3, FREE: T3, Free: 3.7 pg/mL (ref 2.3–4.2)

## 2010-10-03 LAB — TSH: TSH: 0.04 u[IU]/mL — ABNORMAL LOW (ref 0.35–5.50)

## 2010-10-03 LAB — T4, FREE: Free T4: 1.54 ng/dL (ref 0.60–1.60)

## 2010-10-04 ENCOUNTER — Telehealth (INDEPENDENT_AMBULATORY_CARE_PROVIDER_SITE_OTHER): Payer: Self-pay | Admitting: *Deleted

## 2010-10-08 ENCOUNTER — Telehealth: Payer: Self-pay | Admitting: Family Medicine

## 2010-10-16 NOTE — Progress Notes (Signed)
Summary: Questions about labs  Phone Note Call from Patient   Caller: Patient Summary of Call: rcv'd mssg from patient and she stated she had some questions about labs-- also stated she was still having dizziness,headaches, congestion and wanted to know if it was related to the thyroid levels or if it is coming from her sinus infection. Patient is on day 5 of Amox 875...Marland Kitchenplease advise c/b # [id number].... Initial call taken by: Almeta Monas CMA Duncan Dull),  October 08, 2010 11:52 AM  Follow-up for Phone Call        pt's sxs are most likely due to her sinus infxn and not her thyroid. Follow-up by: Neena Rhymes MD,  October 08, 2010 12:15 PM  Additional Follow-up for Phone Call Additional follow up Details #1::        Discuss with patient.........Marland KitchenFelecia Deloach CMA  October 08, 2010 12:20 PM

## 2010-10-16 NOTE — Progress Notes (Signed)
Summary: labs  Phone Note Outgoing Call Call back at [phone]   Call placed by: Doristine Devoid CMA,  October 04, 2010 2:31 PM Call placed to: Patient Summary of Call: T3/T4 are at upper limits and given low TSH will decrease synthroid to daily   Follow-up for Phone Call        left message on machine .....Marland KitchenMarland KitchenDoristine Devoid CMA  October 04, 2010 2:31 PM   Discuss with patient Rx sent to pharmacy...Marland KitchenMarland KitchenFelecia Deloach CMA  October 08, 2010 9:13 AM      New/Updated Medications: LEVOTHYROXINE SODIUM 150 MCG TABS (LEVOTHYROXINE SODIUM) Take 1 tab once daily Prescriptions: LEVOTHYROXINE SODIUM 150 MCG TABS (LEVOTHYROXINE SODIUM) Take 1 tab once daily  #30 x 2   Entered by:   Jeremy Johann CMA   Authorized by:   Neena Rhymes MD   Signed by:   Jeremy Johann CMA on 10/08/2010   Method used:   Faxed to ...       [facility] (retail)       [address]       [place], [place]  [address]       Ph: [phone]       Fax: [phone]   RxID:   [id number]

## 2010-10-22 NOTE — Assessment & Plan Note (Signed)
Summary: dizzy/not feeling well/cbs   Vital Signs:  Patient profile:   35 year old female Weight:      140 pounds BMI:     22.68 Temp:     99.4 degrees F oral BP sitting:   110 / 70  (left arm)  Vitals Entered By: Doristine Devoid CMA (October 03, 2010 11:25 AM) CC: lots of sinus pressure and congestion along w/ HA and nausea    History of Present Illness: 35 yo woman here today for ? sinus infxn.  has had runny nose for last 2 months.  1 month ago had viral URI sxs.  this weekend developed stomach ache, HA.  + subjective fevers alternating w/ chills.  having some dizziness and shaky feeling.  having some facial pressure.  waking up daily w/ HAs.  no diarrhea.  no ear pain but 'sensitive'.  mild cough.  would also like thyroid checked since she is here and 'due for labs'.  Current Medications (verified): 1)  Levothyroxine Sodium 175 Mcg Tabs (Levothyroxine Sodium) .... Take One Tablet Daily Except 1/2 Every Weds 2)  Celexa 40 Mg Tabs (Citalopram Hydrobromide) .... Daily As Directed 3)  Abilify 2 Mg Tabs (Aripiprazole) .... 1/2 Once Daily 4)  Nascobal 500 Mcg/0.71ml Soln (Cyanocobalamin) .Marland Kitchen.. 1 Intranasal Spray Weekly; Check B12 Level After 6 Months 5)  Mobic 15 Mg Tabs (Meloxicam) .... 1/2 - 1 By Mouth Once Daily As Needed Pain 6)  Amoxicillin 875 Mg Tabs (Amoxicillin) .Marland Kitchen.. 1 Tab By Mouth Two Times A Day X10 Days.  Take W/ Food.  Allergies (verified): 1)  ! * Tapazole 2)  ! Synthroid 3)  ! Prozac 4)  ! Wellbutrin  Review of Systems      See HPI  Physical Exam  General:  Well-developed,well-nourished,in no acute distress; alert,appropriate and cooperative throughout examination Head:  NCAT, + TTP over frontal and maxillary sinuses Eyes:  no injxn or inflammation Ears:  External ear exam shows no significant lesions or deformities.  Otoscopic examination reveals clear canals, tympanic membranes are intact bilaterally without bulging, retraction, inflammation or discharge. Hearing  is grossly normal bilaterally. Nose:  edematous turbinates Mouth:  + PND Neck:  No deformities, masses, or tenderness noted. No nodules Lungs:  Normal respiratory effort, chest expands symmetrically. Lungs are clear to auscultation, no crackles or wheezes. Heart:  Normal rate and regular rhythm. S1 and S2 normal without gallop, murmur, click, rub or other extra sounds. Neurologic:  alert & oriented X3, strength normal in all extremities, and DTRs symmetrical and normal.  No tremor    Impression & Recommendations:  Problem # 1:  SINUSITIS - ACUTE-NOS (ICD-461.9) Assessment New pt's current sxs consistent w/ sinus infxn.  start amox.  reviewed supportive care and red flags that should prompt return.  Pt expresses understanding and is in agreement w/ this plan. Her updated medication list for this problem includes:    Amoxicillin 875 Mg Tabs (Amoxicillin) .Marland Kitchen... 1 tab by mouth two times a day x10 days.  take w/ food.  Problem # 2:  HYPOTHYROIDISM (ICD-244.9) Assessment: Unchanged check labs, adjust meds as needed. Her updated medication list for this problem includes:    Levothyroxine Sodium 150 Mcg Tabs (Levothyroxine sodium) .Marland Kitchen... Take 1 tab once daily  Orders: Venipuncture (21308) Specimen Handling (65784) TLB-TSH (Thyroid Stimulating Hormone) (84443-TSH)  Complete Medication List: 1)  Levothyroxine Sodium 150 Mcg Tabs (Levothyroxine sodium) .... Take 1 tab once daily 2)  Celexa 40 Mg Tabs (Citalopram hydrobromide) .... Daily as  directed 3)  Abilify 2 Mg Tabs (Aripiprazole) .... 1/2 once daily 4)  Nascobal 500 Mcg/0.23ml Soln (Cyanocobalamin) .Marland Kitchen.. 1 intranasal spray weekly; check b12 level after 6 months 5)  Mobic 15 Mg Tabs (Meloxicam) .... 1/2 - 1 by mouth once daily as needed pain 6)  Amoxicillin 875 Mg Tabs (Amoxicillin) .Marland Kitchen.. 1 tab by mouth two times a day x10 days.  take w/ food.  Patient Instructions: 1)  We'll notify you of your lab results 2)  You have a sinus infection-  take the Amoxcillin as directed 3)  Drink plenty of fluids 4)  Tylenol or ibuprofen as needed for pain or fever 5)  Add Claritin or Zyrtec for the allergy component 6)  If you have worsening pain/fever or other concerns- please call 7)  Hang in there!!! Prescriptions: AMOXICILLIN 875 MG TABS (AMOXICILLIN) 1 tab by mouth two times a day x10 days.  take w/ food.  #20 x 0   Entered and Authorized by:   Neena Rhymes MD   Signed by:   Neena Rhymes MD on 10/03/2010   Method used:   Electronically to        [facility] (retail)       [address]       [place], [place]  [address]       Ph: [phone]       Fax: [phone]   RxID:   [id number]    Orders Added: 1)  Venipuncture [74259] 2)  Specimen Handling [99000] 3)  TLB-TSH (Thyroid Stimulating Hormone) [84443-TSH] 4)  Est. Patient Level III [[address]]

## 2011-01-10 NOTE — Discharge Summary (Signed)
[name], [name]                ACCOUNT NO.:  [id number]   MEDICAL RECORD NO.:  [id number]          PATIENT TYPE:  INP   LOCATION:  9101                          FACILITY:  [facility]   PHYSICIAN:  Randye Lobo, M.D.   DATE OF BIRTH:  Dec 03, 1975   DATE OF ADMISSION:  08/25/2004  DATE OF DISCHARGE:  08/28/2004                                 DISCHARGE SUMMARY   FINAL DIAGNOSIS:  Intrauterine pregnancy at 37 weeks estimated gestational  age, history of prior cesarean section.  The patient desires repeat cesarean  section.  Hypothyroidism.  Postoperative anemia.   PROCEDURE:  Repeat low transverse cesarean section.   SURGEON:  Dr. Malva Limes.   COMPLICATIONS:  None.   HISTORY:  This 35 year old G2 P0-1-0-1 presents at 37 weeks in early labor.  The patient was scheduled to have a cesarean section on 1/12 and declines  VBAC.  The patient's antepartum course had been complicated by  hypothyroidism.  She was on Synthroid throughout her pregnancy.  Her TSH and  T4 levels were monitored.  The patient also is on some Procardia for some  preterm contractions.  Otherwise, the patient's antepartum course had been  uncomplicated.  She declines vaginal delivery at this point and, therefore,  is taken to the operating room for cesarean section.  The patient was taken  to the operating room by Dr. Malva Limes where a repeat low transverse  cesarean section is performed with a delivery of a 6 pound 13 ounce female  infant with Apgars of 8 and 9.  Delivery went without complication.  The  patient's postoperative course complicated by some mild postoperative  anemia.  She was felt ready for discharge on postoperative day #3.  Sent  home on a regular diet.  Told to decrease activities.  Told to continue her  prenatal vitamins and her iron supplement twice daily.  She was given  Percocet 1-2 every 4 hours as needed for pain.  Told she can use over-the-  counter ibuprofen every 6 hours as needed for  pain.  Was to follow up in the  office in four weeks.   LABS ON DISCHARGE:  The patient had a hemoglobin of 9.3, white blood cell  count of 9.7.      MB/MEDQ  D:  09/19/2004  T:  09/19/2004  Job:  [id number]

## 2011-01-10 NOTE — Op Note (Signed)
NAMEGLENDALE, WHERRY                ACCOUNT NO.:  [id number]   MEDICAL RECORD NO.:  [id number]          PATIENT TYPE:  INP   LOCATION:  NA                            FACILITY:  [facility]   PHYSICIAN:  Malva Limes, M.D.    DATE OF BIRTH:  10/22/1975   DATE OF PROCEDURE:  08/25/2004  DATE OF DISCHARGE:                                 OPERATIVE REPORT   PREOPERATIVE DIAGNOSES:  1.  Intrauterine pregnancy at 37 weeks estimated gestational age.  2.  History of prior cesarean section, the patient declines attempt at      vaginal birth after cesarean section.  3.  Hypothyroidism.   POSTOPERATIVE DIAGNOSES:  1.  Intrauterine pregnancy at 37 weeks estimated gestational age.  2.  History of prior cesarean section, the patient declines attempt at      vaginal birth after cesarean section.  3.  Hypothyroidism.   PROCEDURE:  Repeat low transverse cesarean section.   SURGEON:  Malva Limes, M.D.   ANESTHESIA:  Spinal.   ANTIBIOTICS:  Ancef 1 g.   ESTIMATED BLOOD LOSS:  900 mL.   COMPLICATIONS:  None.   SPECIMENS:  None.   FINDINGS:  The patient had normal fallopian tubes and ovaries bilaterally.  The uterus appeared to be normal.  There were several omental adhesions  which were taken down with the Bovie.  The patient delivered one liver  viable white female infant weighing 6 pounds and 13 ounces.   PROCEDURE IN DETAIL:  The patient was taken to the operating room where a  spinal anesthetic was administered.  She was then placed in the dorsal  supine position with a left, lateral tilt.  The patient was prepped with  Hibiclens and a Foley catheter was placed.  The patient was draped in the  usual fashion for this procedure.  A Pfannenstiel incision was made through  the previous scar.  This was carried down to the fascia.  The fascia was  entered in the midline and extended laterally with the Mayo scissors.  The  rectus muscles were then dissected from the fascia with the Bovie.  The  rectus muscles were divided in the midline and taken superiorly and  inferiorly.  The parietal peritoneum was entered sharply and taken  superiorly and inferiorly.  The bladder flap was taken down sharply.  Several omental adhesions were then noted to be adherent to the left lower  quadrant.  These did not interfere with the uterine incision.  A low  transverse uterine incision was made in the midline and extended laterally  with blunt dissection.  The amniotic sac was entered sharply.  Fluid was  noted to be clear.  The infant was delivered in the vertex presentation.  The nostrils and oropharynx were bulb suctioned.  The remaining infant was  then delivered, the cord doubly clamped and cut, and the infant was handed  to the waiting NICU team.  Cord blood was then obtained.  The placenta was  manually removed.  The uterus was exteriorized.  The uterine cavity was  cleaned with wet lap.  The  uterine incision was closed in a single layer of  0 chromic with a running locking fashion.  The bladder flap was not  repaired.  The omental adhesions were then taken down with the Bovie.   At this point, hemostasis was checked and found to be adequate.  The  parietal peritoneum and rectus muscles were reapproximated in the midline  using 3-0 Monocryl in a running fashion.  The fascia was closed using 0  Monocryl suture in a running fashion.  The subcuticular tissue was made  hemostatic with the Bovie.  Stainless steel clips were used to close the  skin.  The patient tolerated the procedure well.  She was taken to recovery  room in stable condition.     Mark   MA/MEDQ  D:  08/25/2004  T:  08/25/2004  Job:  [id number]

## 2011-01-10 NOTE — Assessment & Plan Note (Signed)
[name]                        [name]   NAME:FERALucky, [name]                         MRN:          [id number]  DATE:11/02/2006                            DOB:          1975/12/20    [name] was seen November 02, 2006 at the age of 77 for evaluation of  hypothyroidism and fibromyalgia.   For several months she has had difficulty sleeping and describes  profound fatigue.  This has been associated with dry scalp, dry hair,  and fragility of the hair, as well as dry skin generally.  She felt that  her thyroid supplement was not helping and was actually off of it for  approximately 30 days.  She has resumed it in the past month.   Additionally she has intermittent diarrhea, manifested by frankly watery  stool.  She thought she may have had intestinal infection.  Only rarely  has she had constipation.  She does have a history of irritable bowel.   She has not been exercising for approximately a month and is on no  specific diet.   PAST MEDICAL HISTORY:  1. Two c-sections.  2. She had a colonoscopy in 2001 for rectal bleeding.  3. In 1995 she started PTU for hyperthyroidism and was on this for 4      years.   FAMILY HISTORY:  Negative for hypertension, stroke, heart attack,  cancer, or thyroid disorder.   She drinks socially minimal amounts and has never smoked.   She presently is on Synthroid 150 mcg daily and Cymbalta 60 mg daily.  She has taken Prozac in the past but felt this caused depression and  felt Wellbutrin was of no benefit.  SHE IS ALSO INTOLERANT TO TAPAZOLE.   Pulse was 56, respiratory rate 15, and blood pressure is 118/80.  Weight  is up 15 pounds to 173 fully clothed.  Pupils were equally round and  reactive to light, there is some thinning of the eyebrows but she has  her eyebrows waxed.  There is no lid lag.  She has no lymphadenopathy, organomegaly.  THYROID:  Is full without definite nodules.  CHEST:  Clear.  She has an S4 with no murmurs.  Dorsalis pedis pulses  were slightly decreased at present.  There is no edema.  SKIN:  Warm and dry without sclerodermitis change.  The tibial areas  reveal normal skin.  Deep tendon reflexes are 1 to 1-1/2+.   Her mood disorder questionnaire was positive for 11 of 13 items.  These  issues were listed as a moderate problem.  Family history is negative  for manic depression.  She states that her anxiety has not been helped  by the Cymbalta, but it has helped the fibromyalgia.  Her suboptimal  response to both an SSRI and dopamine agent (Wellbutrin) and her mood  disorder questionnaire results raise the possibility of a mood disorder.  I  recommended  a psychiatry evaluation as the present agents would not  address these significant mood swings, and a mood stabilizer would be  appropriate.   Because of fatigue and a  known fibromyalgia and hypothyroidism, full  thyroid function tests and comprehensive metabolic profile and CBC with  diff will be drawn.     Titus Dubin. Alwyn Ren, MD,FACP,FCCP  Electronically Signed    [facility]/MedQ  DD: 11/02/2006  DT: 11/02/2006  Job #: [id number]

## 2011-01-14 ENCOUNTER — Other Ambulatory Visit: Payer: Self-pay | Admitting: Family Medicine

## 2011-03-11 ENCOUNTER — Telehealth: Payer: Self-pay | Admitting: *Deleted

## 2011-03-11 NOTE — Telephone Encounter (Signed)
Pt called for samples of Abilify. I made her aware we do not have any. She notes that due to insurance change it has gone up in price and she has used her alternate money for the year already.  Pt was also advised that she is overdue for physical. She scheduled an appt to discuss her meds/insurance change is aware that this appt cannot be her physical.

## 2011-03-13 ENCOUNTER — Encounter: Payer: Self-pay | Admitting: Internal Medicine

## 2011-03-14 ENCOUNTER — Ambulatory Visit: Payer: 59 | Admitting: Internal Medicine

## 2011-04-27 ENCOUNTER — Other Ambulatory Visit: Payer: Self-pay | Admitting: Internal Medicine

## 2011-05-11 ENCOUNTER — Other Ambulatory Visit: Payer: Self-pay | Admitting: Internal Medicine

## 2011-05-20 ENCOUNTER — Ambulatory Visit (INDEPENDENT_AMBULATORY_CARE_PROVIDER_SITE_OTHER): Payer: 59 | Admitting: Family Medicine

## 2011-05-20 ENCOUNTER — Encounter: Payer: Self-pay | Admitting: Family Medicine

## 2011-05-20 DIAGNOSIS — Z0000 Encounter for general adult medical examination without abnormal findings: Secondary | ICD-10-CM | POA: Insufficient documentation

## 2011-05-20 DIAGNOSIS — E538 Deficiency of other specified B group vitamins: Secondary | ICD-10-CM

## 2011-05-20 DIAGNOSIS — E039 Hypothyroidism, unspecified: Secondary | ICD-10-CM

## 2011-05-20 LAB — CBC WITH DIFFERENTIAL/PLATELET
Basophils Absolute: 0 10*3/uL (ref 0.0–0.1)
Basophils Relative: 0.5 % (ref 0.0–3.0)
Eosinophils Absolute: 0.2 10*3/uL (ref 0.0–0.7)
Eosinophils Relative: 4.1 % (ref 0.0–5.0)
HCT: 40.2 % (ref 36.0–46.0)
Hemoglobin: 13.5 g/dL (ref 12.0–15.0)
Lymphocytes Relative: 18.8 % (ref 12.0–46.0)
Lymphs Abs: 0.9 10*3/uL (ref 0.7–4.0)
MCHC: 33.5 g/dL (ref 30.0–36.0)
MCV: 96.6 fl (ref 78.0–100.0)
Monocytes Absolute: 0.4 10*3/uL (ref 0.1–1.0)
Monocytes Relative: 8.2 % (ref 3.0–12.0)
Neutro Abs: 3.3 10*3/uL (ref 1.4–7.7)
Neutrophils Relative %: 68.4 % (ref 43.0–77.0)
Platelets: 226 10*3/uL (ref 150.0–400.0)
RBC: 4.16 Mil/uL (ref 3.87–5.11)
RDW: 13.1 % (ref 11.5–14.6)
WBC: 4.9 10*3/uL (ref 4.5–10.5)

## 2011-05-20 LAB — BASIC METABOLIC PANEL
BUN: 13 mg/dL (ref 6–23)
CO2: 30 mEq/L (ref 19–32)
Calcium: 8.7 mg/dL (ref 8.4–10.5)
Chloride: 105 mEq/L (ref 96–112)
Creatinine, Ser: 0.7 mg/dL (ref 0.4–1.2)
GFR: 104.05 mL/min (ref >60.00)
Glucose, Bld: 103 mg/dL — ABNORMAL HIGH (ref 70–99)
Potassium: 5.5 mEq/L — ABNORMAL HIGH (ref 3.5–5.1)
Sodium: 142 mEq/L (ref 135–145)

## 2011-05-20 LAB — LIPID PANEL
Cholesterol: 137 mg/dL (ref 0–200)
HDL: 57.1 mg/dL (ref >39.00)
LDL Cholesterol: 72 mg/dL (ref 0–99)
Total CHOL/HDL Ratio: 2
Triglycerides: 39 mg/dL (ref 0.0–149.0)
VLDL: 7.8 mg/dL (ref 0.0–40.0)

## 2011-05-20 LAB — HEPATIC FUNCTION PANEL
ALT: 24 U/L (ref 0–35)
AST: 29 U/L (ref 0–37)
Albumin: 3.9 g/dL (ref 3.5–5.2)
Alkaline Phosphatase: 65 U/L (ref 39–117)
Bilirubin, Direct: 0 mg/dL (ref 0.0–0.3)
Total Bilirubin: 0.6 mg/dL (ref 0.3–1.2)
Total Protein: 6.8 g/dL (ref 6.0–8.3)

## 2011-05-20 LAB — TSH: TSH: 6.07 u[IU]/mL — ABNORMAL HIGH (ref 0.35–5.50)

## 2011-05-20 LAB — VITAMIN B12: Vitamin B-12: 678 pg/mL (ref 211–911)

## 2011-05-20 MED ORDER — CITALOPRAM HYDROBROMIDE 40 MG PO TABS: 40.0000 mg | ORAL_TABLET | Freq: Every day | ORAL | Status: DC

## 2011-05-20 MED ORDER — ABILIFY 2 MG PO TABS: ORAL_TABLET | ORAL | Status: DC

## 2011-05-20 MED ORDER — LEVOTHYROXINE SODIUM 150 MCG PO TABS: 150.0000 ug | ORAL_TABLET | Freq: Every day | ORAL | Status: DC

## 2011-05-20 NOTE — Assessment & Plan Note (Signed)
Check labs to determine if meds are appropriate.  Adjust prn.

## 2011-05-20 NOTE — Progress Notes (Signed)
  Subjective:    Patient ID: [name], female    DOB: 1975-12-24, 35 y.o.   MRN: [id number]  HPI CPE- no concerns today.  GYN- Nestor Ramp, UTD on pap Edward Jolly)   Review of Systems Patient reports no vision/ hearing changes, adenopathy,fever, weight change,  persistant/recurrent hoarseness , swallowing issues, chest pain, palpitations, edema, persistant/recurrent cough, hemoptysis, dyspnea (rest/exertional/paroxysmal nocturnal), gastrointestinal bleeding (melena, rectal bleeding), abdominal pain, significant heartburn, bowel changes, GU symptoms (dysuria, hematuria, incontinence), Gyn symptoms (abnormal  bleeding, pain),  syncope, focal weakness, memory loss, numbness & tingling, skin/hair/nail changes, abnormal bruising or bleeding, anxiety, or depression.     Objective:   Physical Exam  General Appearance:    Alert, cooperative, no distress, appears stated age  Head:    Normocephalic, without obvious abnormality, atraumatic  Eyes:    PERRL, conjunctiva/corneas clear, EOM's intact, fundi    benign, both eyes  Ears:    Normal TM's and external ear canals, both ears  Nose:   Nares normal, septum midline, mucosa normal, no drainage    or sinus tenderness  Throat:   Lips, mucosa, and tongue normal; teeth and gums normal  Neck:   Supple, symmetrical, trachea midline, no adenopathy;    Thyroid: no enlargement/tenderness/nodules  Back:     Symmetric, no curvature, ROM normal, no CVA tenderness  Lungs:     Clear to auscultation bilaterally, respirations unlabored  Chest Wall:    No tenderness or deformity   Heart:    Regular rate and rhythm, S1 and S2 normal, no murmur, rub   or gallop  Breast Exam:    Deferred to GYN  Abdomen:     Soft, non-tender, bowel sounds active all four quadrants,    no masses, no organomegaly  Genitalia:    Deferred to GYN  Rectal:    Extremities:   Extremities normal, atraumatic, no cyanosis or edema  Pulses:   2+ and symmetric all extremities  Skin:   Skin  color, texture, turgor normal, no rashes or lesions  Lymph nodes:   Cervical, supraclavicular, and axillary nodes normal  Neurologic:   CNII-XII intact, normal strength, sensation and reflexes    throughout          Assessment & Plan:

## 2011-05-20 NOTE — Assessment & Plan Note (Signed)
Pt's PE WNL.  UTD on GYN.  Check labs.  Anticipatory guidance provided.  

## 2011-05-20 NOTE — Assessment & Plan Note (Signed)
Chronic problem for pt, using B12 nasal spray.  Check labs.

## 2011-05-20 NOTE — Patient Instructions (Signed)
Follow up in 6 months or as needed Your exam looks great!  Keep up the good work!! [employer] notify you of your lab results Call with any questions or concerns Happy Fall!!!

## 2011-05-21 NOTE — Progress Notes (Signed)
Quick Note:  Several attempts to contact pt. Pt's phone experiencing network difficulties ______ 

## 2011-05-22 ENCOUNTER — Telehealth: Payer: Self-pay

## 2011-05-22 NOTE — Telephone Encounter (Signed)
Husband aware forms ready to pick up. Will send a copy of pt's lab results also.

## 2011-05-22 NOTE — Telephone Encounter (Signed)
Message copied by Beverely Low on Thu May 22, 2011  4:53 PM ------      Message from: Sheliah Hatch      Created: Tue May 20, 2011  3:26 PM       K+ is elevated.  Needs to limit citrus fruits, bananas in diet and repeat on Thursday (dx- hyperkalemia)      TSH is abnormal- needs to increase Synthroid to daily      Remainder of labs look good

## 2011-06-12 ENCOUNTER — Telehealth: Payer: Self-pay

## 2011-06-12 MED ORDER — LEVOTHYROXINE SODIUM 175 MCG PO TABS: 175.0000 ug | ORAL_TABLET | Freq: Every day | ORAL | Status: DC

## 2011-06-12 NOTE — Telephone Encounter (Signed)
Requesting Rx for synthroid 175 mcg.  Pt aware Rx sent

## 2011-06-30 ENCOUNTER — Encounter: Payer: Self-pay | Admitting: Family Medicine

## 2011-06-30 ENCOUNTER — Ambulatory Visit (INDEPENDENT_AMBULATORY_CARE_PROVIDER_SITE_OTHER): Payer: 59 | Admitting: Family Medicine

## 2011-06-30 VITALS — BP 118/70 | HR 94 | Temp 99.2°F | Ht 66.25 in | Wt 156.6 lb

## 2011-06-30 DIAGNOSIS — F411 Generalized anxiety disorder: Secondary | ICD-10-CM

## 2011-06-30 MED ORDER — ALPRAZOLAM 0.5 MG PO TABS: 0.5000 mg | ORAL_TABLET | Freq: Three times a day (TID) | ORAL | Status: AC | PRN

## 2011-06-30 MED ORDER — ARIPIPRAZOLE 5 MG PO TABS: 5.0000 mg | ORAL_TABLET | Freq: Every day | ORAL | Status: DC

## 2011-06-30 NOTE — Progress Notes (Signed)
  Subjective:    Patient ID: [name], female    DOB: March 27, 1976, 35 y.o.   MRN: [id number]  HPI Anxiety- reports she is very emotional, depressed, moody.  Tearful when talking.  Having 'anxiety attacks'.  sxs have worsened over the last month b/c of Breast Cancer Awareness (lost close friend).  'i don't feel strong enough to go talk to someone right now'.  73 yo son is starting to date and 'this is freaking me out'.  On Abilify 1mg  daily and Citalopram.  Not seeing psych.     Review of Systems For ROS see HPI     Objective:   Physical Exam  Vitals reviewed. Constitutional: She appears well-developed and well-nourished. She appears distressed (tearful, anxious).  Psychiatric:       Tearful, anxious- upset by this anxiety          Assessment & Plan:

## 2011-06-30 NOTE — Patient Instructions (Addendum)
Follow up in 1 month to recheck mood Increase to 2 of your current Abilify and then take 1 of the new script Take the Alprazolam as need for those moments of panic Try and hang in there!!!  This will get better! Hang in there!!!

## 2011-07-01 ENCOUNTER — Encounter: Payer: Self-pay | Admitting: Family Medicine

## 2011-07-01 NOTE — Assessment & Plan Note (Signed)
Deteriorated.  Will increase Abilify to 5mg  as pt doesn't feel her low dose is helping her mood.  Will also start xanax prn for panicked moments.  Discussed changing SSRI to get better control but pt fears an unknown entity at this point.  Also discussed adding Wellbutrin but this has not worked for pt in the past.  Will follow closely.

## 2011-08-26 LAB — HM PAP SMEAR: HM Pap smear: NORMAL

## 2011-10-08 ENCOUNTER — Encounter: Payer: Self-pay | Admitting: Family Medicine

## 2011-10-08 ENCOUNTER — Ambulatory Visit (INDEPENDENT_AMBULATORY_CARE_PROVIDER_SITE_OTHER): Payer: 59 | Admitting: Family Medicine

## 2011-10-08 DIAGNOSIS — IMO0001 Reserved for inherently not codable concepts without codable children: Secondary | ICD-10-CM

## 2011-10-08 DIAGNOSIS — F411 Generalized anxiety disorder: Secondary | ICD-10-CM

## 2011-10-08 DIAGNOSIS — J019 Acute sinusitis, unspecified: Secondary | ICD-10-CM

## 2011-10-08 MED ORDER — DULOXETINE HCL 30 MG PO CPEP: 30.0000 mg | ORAL_CAPSULE | Freq: Every day | ORAL | Status: DC

## 2011-10-08 MED ORDER — AMOXICILLIN 875 MG PO TABS: 875.0000 mg | ORAL_TABLET | Freq: Two times a day (BID) | ORAL | Status: AC

## 2011-10-08 NOTE — Patient Instructions (Signed)
Schedule a follow up in 4-6 weeks to recheck mood STOP the celexa START the Cymbalta in the AM Take the Amoxicillin twice daily w/ food for your sinus infection Add mucinex to thin your drainage Drink plenty of fluids REST! Hang in there!!!

## 2011-10-08 NOTE — Assessment & Plan Note (Signed)
Pt's sxs and PE consistent w/ infxn.  Start abx.  Reviewed supportive care and red flags that should prompt return.  Pt expressed understanding and is in agreement w/ plan.  

## 2011-10-08 NOTE — Assessment & Plan Note (Signed)
Anxiety is improved but depression is not.  Will stop Celexa and restart Cymbalta to hopefully improve both depression and fibro.  Will continue Abilify at current dose and follow closely.

## 2011-10-08 NOTE — Assessment & Plan Note (Signed)
Deteriorated.  Will restart Cymbalta in hopes of improving pain.  Pt expressed understanding and is in agreement w/ plan.

## 2011-10-08 NOTE — Progress Notes (Signed)
  Subjective:    Patient ID: [name], female    DOB: 04/15/1976, 36 y.o.   MRN: [id number]  HPI Depression/anxiety- reports sxs are 'generally better' in regards to anxiety.  Reports depression is still present- 'just no energy to do anything'.  Denies tearfulness.  Having poor sleep.  Feels increasing abilify to 5mg  was 'helpful'.  Feels fibro sxs are worse recently.  Was previously on Cymbalta and this was beneficial for some time until it 'just stopped working'.  Head cold- sxs started 1 week ago.  Felt things were improving over the weekend but then flared yesterday.  R ear pain, facial pressure, PND, sore throat.  Mild cough.  No fevers.  No known sick contacts.   Review of Systems For ROS see HPI     Objective:   Physical Exam  Vitals reviewed. Constitutional: She appears well-developed and well-nourished. No distress.  HENT:  Head: Normocephalic and atraumatic.  Right Ear: Tympanic membrane normal.  Left Ear: Tympanic membrane normal.  Nose: Mucosal edema and rhinorrhea present. Right sinus exhibits maxillary sinus tenderness and frontal sinus tenderness. Left sinus exhibits maxillary sinus tenderness and frontal sinus tenderness.  Mouth/Throat: Uvula is midline and mucous membranes are normal. Posterior oropharyngeal erythema present. No oropharyngeal exudate.  Eyes: Conjunctivae and EOM are normal. Pupils are equal, round, and reactive to light.  Neck: Normal range of motion. Neck supple.  Cardiovascular: Normal rate, regular rhythm and normal heart sounds.   Pulmonary/Chest: Effort normal and breath sounds normal. No respiratory distress. She has no wheezes.  Lymphadenopathy:    She has no cervical adenopathy.          Assessment & Plan:

## 2011-12-15 ENCOUNTER — Encounter: Payer: Self-pay | Admitting: Family Medicine

## 2011-12-15 ENCOUNTER — Ambulatory Visit (INDEPENDENT_AMBULATORY_CARE_PROVIDER_SITE_OTHER): Payer: 59 | Admitting: Family Medicine

## 2011-12-15 VITALS — BP 124/78 | HR 96 | Temp 98.5°F | Ht 65.5 in | Wt 157.8 lb

## 2011-12-15 DIAGNOSIS — IMO0001 Reserved for inherently not codable concepts without codable children: Secondary | ICD-10-CM

## 2011-12-15 MED ORDER — MELOXICAM 15 MG PO TABS: 15.0000 mg | ORAL_TABLET | Freq: Every day | ORAL | Status: DC

## 2011-12-15 MED ORDER — DULOXETINE HCL 60 MG PO CPEP: 60.0000 mg | ORAL_CAPSULE | Freq: Every day | ORAL | Status: DC

## 2011-12-15 NOTE — Patient Instructions (Signed)
Increase the Cymbalta to 60 mg- 2 of what you have at home and then 1 of the new script Start the Mobic daily during this flare- once your symptoms seem to improve, you can stop Add Tylenol every 4-6 hrs as needed for breakthrough pain Call with any questions or concerns Hang in there!!!

## 2011-12-15 NOTE — Progress Notes (Signed)
  Subjective:    Patient ID: [name], female    DOB: 11-30-1975, 36 y.o.   MRN: [id number]  HPI Fibro- reports that 'it's really been bad lately'.  Unable to exercise b/c of it.  Really became noticeable x2 weeks.  Going to bed earlier to elevate legs.  Taking Motrin w/ minimal relief.  Currently having period and reports sxs are always worse preceding menses.  Worries about taking too much motrin.   Review of Systems For ROS see HPI     Objective:   Physical Exam  Vitals reviewed. Constitutional: She appears well-developed and well-nourished. No distress.  Psychiatric: Her behavior is normal. Judgment and thought content normal.       Anxious- tearful when talking about pain sxs          Assessment & Plan:

## 2011-12-18 ENCOUNTER — Ambulatory Visit: Payer: 59 | Admitting: Family Medicine

## 2011-12-30 NOTE — Assessment & Plan Note (Signed)
Deteriorated.  Increase Cymbalta to 60mg  daily.  Add Mobic to control inflammation.  Reviewed supportive care and red flags that should prompt return.  Pt expressed understanding and is in agreement w/ plan.

## 2011-12-31 ENCOUNTER — Telehealth: Payer: Self-pay | Admitting: *Deleted

## 2011-12-31 NOTE — Telephone Encounter (Signed)
.  left message to have patient return my call.  

## 2011-12-31 NOTE — Telephone Encounter (Signed)
Patient request call back to discuss f/u questions regarding last OV/SLS

## 2011-12-31 NOTE — Telephone Encounter (Signed)
Pt returned call to advise that she is concerned that she is taking all the medication she is supposed to be taking properly per notes an increase to Cymbalta 60mg  in April was noted as 30mg , she has noted that the fatigue has gotten worse since the increase, takes Abilify at night before bed, and levothyroxine in the morning before she eats, the cymbalta 60mg  10am or 11am after she eats breakfest, discussed medication regimen with MD Beverely Low and she advised that pt is taking her medications properly and she will need an OV, scheduled pt for tomorrow 01-01-12 at 9:30am, pt accepted apt, MD Beverely Low made aware verbally

## 2012-01-01 ENCOUNTER — Encounter: Payer: Self-pay | Admitting: Family Medicine

## 2012-01-01 ENCOUNTER — Ambulatory Visit (INDEPENDENT_AMBULATORY_CARE_PROVIDER_SITE_OTHER): Payer: 59 | Admitting: Family Medicine

## 2012-01-01 VITALS — BP 124/82 | HR 77 | Temp 98.6°F | Ht 65.5 in | Wt 158.0 lb

## 2012-01-01 DIAGNOSIS — E538 Deficiency of other specified B group vitamins: Secondary | ICD-10-CM

## 2012-01-01 DIAGNOSIS — R5381 Other malaise: Secondary | ICD-10-CM

## 2012-01-01 DIAGNOSIS — IMO0001 Reserved for inherently not codable concepts without codable children: Secondary | ICD-10-CM

## 2012-01-01 DIAGNOSIS — E039 Hypothyroidism, unspecified: Secondary | ICD-10-CM

## 2012-01-01 DIAGNOSIS — R5383 Other fatigue: Secondary | ICD-10-CM

## 2012-01-01 LAB — CBC WITH DIFFERENTIAL/PLATELET
Basophils Absolute: 0 10*3/uL (ref 0.0–0.1)
Basophils Relative: 0.4 % (ref 0.0–3.0)
Eosinophils Absolute: 0.1 10*3/uL (ref 0.0–0.7)
Eosinophils Relative: 1.3 % (ref 0.0–5.0)
HCT: 40 % (ref 36.0–46.0)
Hemoglobin: 13.5 g/dL (ref 12.0–15.0)
Lymphocytes Relative: 16.9 % (ref 12.0–46.0)
Lymphs Abs: 0.9 10*3/uL (ref 0.7–4.0)
MCHC: 33.7 g/dL (ref 30.0–36.0)
MCV: 94.7 fl (ref 78.0–100.0)
Monocytes Absolute: 0.4 10*3/uL (ref 0.1–1.0)
Monocytes Relative: 7.1 % (ref 3.0–12.0)
Neutro Abs: 3.8 10*3/uL (ref 1.4–7.7)
Neutrophils Relative %: 74.3 % (ref 43.0–77.0)
Platelets: 212 10*3/uL (ref 150.0–400.0)
RBC: 4.22 Mil/uL (ref 3.87–5.11)
RDW: 12.6 % (ref 11.5–14.6)
WBC: 5.2 10*3/uL (ref 4.5–10.5)

## 2012-01-01 LAB — HEPATIC FUNCTION PANEL
ALT: 25 U/L (ref 0–35)
AST: 27 U/L (ref 0–37)
Albumin: 4.1 g/dL (ref 3.5–5.2)
Alkaline Phosphatase: 59 U/L (ref 39–117)
Bilirubin, Direct: 0 mg/dL (ref 0.0–0.3)
Total Bilirubin: 0.6 mg/dL (ref 0.3–1.2)
Total Protein: 6.9 g/dL (ref 6.0–8.3)

## 2012-01-01 LAB — VITAMIN B12: Vitamin B-12: 530 pg/mL (ref 211–911)

## 2012-01-01 LAB — BASIC METABOLIC PANEL
BUN: 18 mg/dL (ref 6–23)
CO2: 27 mEq/L (ref 19–32)
Calcium: 8.8 mg/dL (ref 8.4–10.5)
Chloride: 104 mEq/L (ref 96–112)
Creatinine, Ser: 0.7 mg/dL (ref 0.4–1.2)
GFR: 105.47 mL/min (ref >60.00)
Glucose, Bld: 95 mg/dL (ref 70–99)
Potassium: 4.1 mEq/L (ref 3.5–5.1)
Sodium: 140 mEq/L (ref 135–145)

## 2012-01-01 LAB — TSH: TSH: 0.39 u[IU]/mL (ref 0.35–5.50)

## 2012-01-01 NOTE — Assessment & Plan Note (Signed)
Chronic problem.  Maintained on Synthroid.  Check labs to ensure that this is not cause of pt's current fatigue.

## 2012-01-01 NOTE — Patient Instructions (Signed)
We'll notify you of your lab results If labs are abnormal- we'll refer to the appropriate specialist or adjust meds If labs are normal, we need to consider neuropsych for additional meds vs med changes Call with any questions or concerns Happy Mother's Day!!!

## 2012-01-01 NOTE — Assessment & Plan Note (Signed)
Deteriorated.  Now severe.  Husband accompanied pt to appt today due to his concern.  ? Fibromyalgia syndrome but must r/o metabolic abnormalities.  May be med related (abilify is sedating but cymbalta is typically activating).  Check labs to r/o rheum process.  If labs all normal will refer to psych for better management of depression and fibro syndrome.  Pt expressed understanding and is in agreement w/ plan.

## 2012-01-01 NOTE — Assessment & Plan Note (Signed)
Has hx of this.  May be contributing to current fatigue.  Check level.  Supplement prn.

## 2012-01-01 NOTE — Progress Notes (Signed)
  Subjective:    Patient ID: [name], female    DOB: [birth year], 36 y.o.   MRN: [id number]  HPI Fatigue- increased Cymbalta at last visit to assist w/ pain and fatigue but this did not work.  More fatigued than previous.  Pt is frustrated.  'just a little bit of movement hurts'.  Sleeping well.  Has hx of B12 deficiency- taking oral B12.  Hx of hypothyroid- on synthroid, last labs done this fall.  Hx of iron deficiency anemia- currently on daily iron.  No recent med changes.  Pain of fibro is intermittent throughout the day but is in pain daily.  Initial dx made by rheum in [place].  Was started on Abilify by Dr Alwyn Ren for both pain and depression.  Review of Systems For ROS see HPI     Objective:   Physical Exam  Vitals reviewed. Constitutional: She is oriented to person, place, and time. She appears well-developed and well-nourished. No distress.  HENT:  Head: Normocephalic and atraumatic.  Eyes: Conjunctivae and EOM are normal. Pupils are equal, round, and reactive to light.  Neck: Normal range of motion. Neck supple. No thyromegaly present.  Cardiovascular: Normal rate, regular rhythm, normal heart sounds and intact distal pulses.   No murmur heard. Pulmonary/Chest: Effort normal and breath sounds normal. No respiratory distress.  Abdominal: Soft. She exhibits no distension. There is no tenderness.  Musculoskeletal: She exhibits no edema.  Lymphadenopathy:    She has no cervical adenopathy.  Neurological: She is alert and oriented to person, place, and time.  Skin: Skin is warm and dry.  Psychiatric: She has a normal mood and affect. Her behavior is normal. Judgment and thought content normal.          Assessment & Plan:

## 2012-01-01 NOTE — Assessment & Plan Note (Signed)
Deteriorated.  May be responsible for pt's current level of fatigue.  Will not adjust meds at this time.  May need psych/pain management.

## 2012-01-02 ENCOUNTER — Encounter: Payer: Self-pay | Admitting: *Deleted

## 2012-01-02 LAB — ANA: Anti Nuclear Antibody(ANA): NEGATIVE

## 2012-01-07 ENCOUNTER — Telehealth: Payer: Self-pay | Admitting: *Deleted

## 2012-01-07 NOTE — Telephone Encounter (Signed)
Pt called to clarify information given to husband in absence while out of town, per gave permission to do so read the following information to the pt:  All labs are normal (still waiting on ANA). No obvious cause of fatigue. At this time, will refer to psych for better management of Fibro sxs. Please provide names and #s- highlighting Drs Nolen Mu and Evelene Croon Pt understood and will call the MD suggested.

## 2012-01-09 ENCOUNTER — Other Ambulatory Visit: Payer: Self-pay | Admitting: Family Medicine

## 2012-01-09 MED ORDER — LEVOTHYROXINE SODIUM 175 MCG PO TABS: 175.0000 ug | ORAL_TABLET | Freq: Every day | ORAL | Status: DC

## 2012-01-09 NOTE — Telephone Encounter (Signed)
rx sent to pharmacy by e-script  

## 2012-01-09 NOTE — Telephone Encounter (Signed)
Refill Levothyroxin TAB MYLA Qty 30 Take one tablet by mouth one time daily  Last filled 4.13.13  Last OV 5.9.13

## 2012-03-15 ENCOUNTER — Other Ambulatory Visit: Payer: Self-pay | Admitting: Family Medicine

## 2012-03-15 NOTE — Telephone Encounter (Addendum)
.  left message to have patient return my call to clarify if pt is still taking this medication and how she is taking it per noted pt has not had a refill in months.   Pt returned call to advise she is taking the medication and it is working great for her, sent via escribe for #30 with 6 refills Scheduled CPE 05-19-12 pt advised she will accept apt and come fasting as advised. MD tabori aware.

## 2012-03-15 NOTE — Telephone Encounter (Signed)
refill ABILIFY 5 MG Take 5 mg by mouth daily. # 30, last fill 6.20.13 Last ov (Acute) 5.9.13

## 2012-03-17 MED ORDER — ARIPIPRAZOLE 5 MG PO TABS: 5.0000 mg | ORAL_TABLET | Freq: Every day | ORAL | Status: DC

## 2012-04-19 ENCOUNTER — Other Ambulatory Visit: Payer: Self-pay | Admitting: *Deleted

## 2012-04-19 MED ORDER — DULOXETINE HCL 60 MG PO CPEP: 120.0000 mg | ORAL_CAPSULE | Freq: Every day | ORAL | Status: DC

## 2012-04-19 NOTE — Telephone Encounter (Signed)
Ok to change script to 2 tabs daily, #60, 3 refills

## 2012-04-19 NOTE — Telephone Encounter (Signed)
Pt called to request a refill on her cymbalta, pt noted that the RX was sent by our office however pt started to see MD Valinda Hoar, per referred by MD Beverely Low, however that facility is no longer accepting her insurance, pt noted MD Baker increased her 60mg  to 2capsules every morning and notes this dosage works for her, pt notes that she is almost out, noted last rx sent on 12-15-11 #30 with 6 refills to take 60mg  daily, please advise, pt is currently calling pharmacy to see how much is left from old RX, notes upcoming apt on 05-19-12, please note

## 2012-04-19 NOTE — Telephone Encounter (Signed)
rx sent to pharmacy by e-script for new RX 120mg  (take 2 capsules daily) pt made aware

## 2012-05-19 ENCOUNTER — Encounter: Payer: Self-pay | Admitting: Family Medicine

## 2012-05-19 ENCOUNTER — Ambulatory Visit (INDEPENDENT_AMBULATORY_CARE_PROVIDER_SITE_OTHER): Payer: 59 | Admitting: Family Medicine

## 2012-05-19 VITALS — BP 120/77 | HR 95 | Temp 98.1°F | Ht 65.0 in | Wt 165.0 lb

## 2012-05-19 DIAGNOSIS — Z23 Encounter for immunization: Secondary | ICD-10-CM

## 2012-05-19 DIAGNOSIS — Z0000 Encounter for general adult medical examination without abnormal findings: Secondary | ICD-10-CM

## 2012-05-19 LAB — BASIC METABOLIC PANEL
BUN: 14 mg/dL (ref 6–23)
CO2: 27 mEq/L (ref 19–32)
Calcium: 9 mg/dL (ref 8.4–10.5)
Chloride: 103 mEq/L (ref 96–112)
Creatinine, Ser: 0.7 mg/dL (ref 0.4–1.2)
GFR: 109 mL/min (ref >60.00)
Glucose, Bld: 91 mg/dL (ref 70–99)
Potassium: 3.8 mEq/L (ref 3.5–5.1)
Sodium: 140 mEq/L (ref 135–145)

## 2012-05-19 LAB — CBC WITH DIFFERENTIAL/PLATELET
Basophils Absolute: 0 10*3/uL (ref 0.0–0.1)
Basophils Relative: 0.7 % (ref 0.0–3.0)
Eosinophils Absolute: 0.2 10*3/uL (ref 0.0–0.7)
Eosinophils Relative: 3.7 % (ref 0.0–5.0)
HCT: 41.1 % (ref 36.0–46.0)
Hemoglobin: 13.5 g/dL (ref 12.0–15.0)
Lymphocytes Relative: 20.3 % (ref 12.0–46.0)
Lymphs Abs: 1.1 10*3/uL (ref 0.7–4.0)
MCHC: 32.9 g/dL (ref 30.0–36.0)
MCV: 96.7 fl (ref 78.0–100.0)
Monocytes Absolute: 0.4 10*3/uL (ref 0.1–1.0)
Monocytes Relative: 7.5 % (ref 3.0–12.0)
Neutro Abs: 3.6 10*3/uL (ref 1.4–7.7)
Neutrophils Relative %: 67.8 % (ref 43.0–77.0)
Platelets: 245 10*3/uL (ref 150.0–400.0)
RBC: 4.25 Mil/uL (ref 3.87–5.11)
RDW: 12.4 % (ref 11.5–14.6)
WBC: 5.3 10*3/uL (ref 4.5–10.5)

## 2012-05-19 LAB — LIPID PANEL
Cholesterol: 147 mg/dL (ref 0–200)
HDL: 51 mg/dL (ref >39.00)
LDL Cholesterol: 84 mg/dL (ref 0–99)
Total CHOL/HDL Ratio: 3
Triglycerides: 62 mg/dL (ref 0.0–149.0)
VLDL: 12.4 mg/dL (ref 0.0–40.0)

## 2012-05-19 LAB — HEPATIC FUNCTION PANEL
ALT: 23 U/L (ref 0–35)
AST: 24 U/L (ref 0–37)
Albumin: 4.1 g/dL (ref 3.5–5.2)
Alkaline Phosphatase: 73 U/L (ref 39–117)
Bilirubin, Direct: 0 mg/dL (ref 0.0–0.3)
Total Bilirubin: 0.7 mg/dL (ref 0.3–1.2)
Total Protein: 7.1 g/dL (ref 6.0–8.3)

## 2012-05-19 LAB — TSH: TSH: 5.13 u[IU]/mL (ref 0.35–5.50)

## 2012-05-19 NOTE — Patient Instructions (Addendum)
Follow up in 1 year or as needed We'll notify you of your lab results and make any changes if needed Keep up the good work!  You look great! Call with any questions or concerns Happy Bel[employer] Birthday!!! 

## 2012-05-19 NOTE — Progress Notes (Signed)
  Subjective:    Patient ID: [name], female    DOB: 07-01-1976, 36 y.o.   MRN: [id number]  HPI CPE- has GYN, UTD on pap [facility] [place]).   Review of Systems Patient reports no vision/ hearing changes, adenopathy,fever, weight change,  persistant/recurrent hoarseness , swallowing issues, chest pain, palpitations, edema, persistant/recurrent cough, hemoptysis, dyspnea (rest/exertional/paroxysmal nocturnal), gastrointestinal bleeding (melena, rectal bleeding), abdominal pain, significant heartburn, bowel changes, GU symptoms (dysuria, hematuria, incontinence), Gyn symptoms (abnormal  bleeding, pain),  syncope, focal weakness, memory loss, numbness & tingling, skin/hair/nail changes, abnormal bruising or bleeding, anxiety, or depression.     Objective:   Physical Exam General Appearance:    Alert, cooperative, no distress, appears stated age  Head:    Normocephalic, without obvious abnormality, atraumatic  Eyes:    PERRL, conjunctiva/corneas clear, EOM's intact, fundi    benign, both eyes  Ears:    Normal TM's and external ear canals, both ears  Nose:   Nares normal, septum midline, mucosa normal, no drainage    or sinus tenderness  Throat:   Lips, mucosa, and tongue normal; teeth and gums normal  Neck:   Supple, symmetrical, trachea midline, no adenopathy;    Thyroid: no enlargement/tenderness/nodules  Back:     Symmetric, no curvature, ROM normal, no CVA tenderness  Lungs:     Clear to auscultation bilaterally, respirations unlabored  Chest Wall:    No tenderness or deformity   Heart:    Regular rate and rhythm, S1 and S2 normal, no murmur, rub   or gallop  Breast Exam:    Deferred to GYN  Abdomen:     Soft, non-tender, bowel sounds active all four quadrants,    no masses, no organomegaly  Genitalia:    Deferred to GYN  Rectal:    Extremities:   Extremities normal, atraumatic, no cyanosis or edema  Pulses:   2+ and symmetric all extremities  Skin:   Skin color, texture, turgor  normal, no rashes or lesions  Lymph nodes:   Cervical, supraclavicular, and axillary nodes normal  Neurologic:   CNII-XII intact, normal strength, sensation and reflexes    throughout          Assessment & Plan:

## 2012-05-19 NOTE — Assessment & Plan Note (Signed)
Normal CPE.  UTD on GYN.  Flu shot given.  Check labs.  Anticipatory guidance provided.

## 2012-05-22 LAB — VITAMIN D 1,25 DIHYDROXY
Vitamin D 1, 25 (OH)2 Total: 56 pg/mL (ref 18–72)
Vitamin D2 1, 25 (OH)2: <8 pg/mL
Vitamin D3 1, 25 (OH)2: 56 pg/mL

## 2012-05-25 ENCOUNTER — Encounter: Payer: Self-pay | Admitting: *Deleted

## 2012-06-04 ENCOUNTER — Encounter: Payer: Self-pay | Admitting: Family Medicine

## 2012-06-04 ENCOUNTER — Ambulatory Visit (INDEPENDENT_AMBULATORY_CARE_PROVIDER_SITE_OTHER): Payer: 59 | Admitting: Family Medicine

## 2012-06-04 VITALS — BP 105/68 | HR 103 | Temp 98.0°F | Ht 65.0 in | Wt 166.8 lb

## 2012-06-04 DIAGNOSIS — J069 Acute upper respiratory infection, unspecified: Secondary | ICD-10-CM

## 2012-06-04 MED ORDER — MOMETASONE FUROATE 50 MCG/ACT NA SUSP: 2.0000 | Freq: Every day | NASAL | Status: DC

## 2012-06-04 NOTE — Progress Notes (Signed)
  Subjective:     [name] is a 36 y.o. female who presents for evaluation of symptoms of a URI. Symptoms include bilateral ear pressure/pain, congestion, facial pain, fever low grade, lightheadedness, nasal congestion, post nasal drip and sore throat. Onset of symptoms was 5 days ago, and has been gradually worsening since that time. Treatment to date: none.  The following portions of the patient's history were reviewed and updated as appropriate: allergies, current medications, past family history, past medical history, past social history, past surgical history and problem list.  Review of Systems Pertinent items are noted in HPI.   Objective:    BP 105/68  Pulse 103  Temp 98 F (36.7 C) (Oral)  Ht 5\' 5"  (1.651 m)  Wt 166 lb 12.8 oz (75.66 kg)  BMI 27.76 kg/m2  SpO2 97%  LMP 05/26/2012 General appearance: alert, cooperative, appears stated age and no distress Ears: normal TM's and external ear canals both ears Nose: clear discharge, mild congestion, turbinates red, swollen, no sinus tenderness Throat: abnormal findings: pnd Neck: mild anterior cervical adenopathy, supple, symmetrical, trachea midline and thyroid not enlarged, symmetric, no tenderness/mass/nodules Lungs: clear to auscultation bilaterally   Assessment:    viral upper respiratory illness   Plan:    Discussed diagnosis and treatment of URI. Suggested symptomatic OTC remedies. Nasal saline spray for congestion. Nasal steroids per orders. Follow up as needed.

## 2012-06-04 NOTE — Patient Instructions (Signed)

## 2012-07-09 ENCOUNTER — Ambulatory Visit (INDEPENDENT_AMBULATORY_CARE_PROVIDER_SITE_OTHER): Payer: 59 | Admitting: Family Medicine

## 2012-07-09 ENCOUNTER — Encounter: Payer: Self-pay | Admitting: Family Medicine

## 2012-07-09 ENCOUNTER — Telehealth: Payer: Self-pay | Admitting: Family Medicine

## 2012-07-09 VITALS — BP 110/72 | HR 93 | Temp 98.4°F | Wt 165.0 lb

## 2012-07-09 DIAGNOSIS — J019 Acute sinusitis, unspecified: Secondary | ICD-10-CM

## 2012-07-09 MED ORDER — AMOXICILLIN 875 MG PO TABS: 875.0000 mg | ORAL_TABLET | Freq: Two times a day (BID) | ORAL | Status: DC

## 2012-07-09 MED ORDER — GUAIFENESIN-CODEINE 100-10 MG/5ML PO SYRP: 10.0000 mL | ORAL_SOLUTION | Freq: Three times a day (TID) | ORAL | Status: DC | PRN

## 2012-07-09 NOTE — Assessment & Plan Note (Signed)
Pt's sxs and PE consistent w/ infxn.  Start abx.  Cough med prn.  Reviewed supportive care and red flags that should prompt return.  Pt expressed understanding and is in agreement w/ plan.  

## 2012-07-09 NOTE — Telephone Encounter (Signed)
Patient Information:  Caller Name: Tyyne  Phone: [phone]  Patient: [name], [name]  Gender: Female  DOB: 1975/09/07  Age: 36 Years  PCP: Sheliah Hatch  Pregnant: No   Symptoms  Reason For Call & Symptoms: cold and cough  Reviewed Health History In EMR: Yes  Reviewed Medications In EMR: Yes  Reviewed Allergies In EMR: Yes  Date of Onset of Symptoms: 06/05/2012  Treatments Tried: Mucinex Cold & Flu; Alka Seltzer Cold & Flu  Treatments Tried Worked: No OB:  LMP: 06/17/2012  Guideline(s) Used:  Cough  Disposition Per Guideline:   See Today in Office  Reason For Disposition Reached:   Sinus pain persists after using nasal washes and pain medicine > 24 hours  Advice Given:  N/A  Office Follow Up:  Does the office need to follow up with this patient?: No  Instructions For The Office: N/A

## 2012-07-09 NOTE — Progress Notes (Signed)
  Subjective:    Patient ID: [name], female    DOB: 02/13/1976, 36 y.o.   MRN: [id number]  HPI ? Sinus infxn- sxs started w/ sore throat on Tuesday.  Now has progressed to HA, dizziness, facial pain/pressure.  No fevers.  + nasal congestion, dry cough.  No ear pain.  No known sick contacts.  Has been out of work since Wednesday.  OTC meds- mucinex multisymptom, alka seltzer cold and flu- not working.   Review of Systems For ROS see HPI     Objective:   Physical Exam  Vitals reviewed. Constitutional: She appears well-developed and well-nourished. No distress.  HENT:  Head: Normocephalic and atraumatic.  Right Ear: Tympanic membrane normal.  Left Ear: Tympanic membrane normal.  Nose: Mucosal edema and rhinorrhea present. Right sinus exhibits maxillary sinus tenderness and frontal sinus tenderness. Left sinus exhibits maxillary sinus tenderness and frontal sinus tenderness.  Mouth/Throat: Uvula is midline and mucous membranes are normal. Posterior oropharyngeal erythema present. No oropharyngeal exudate.  Eyes: Conjunctivae normal and EOM are normal. Pupils are equal, round, and reactive to light.  Neck: Normal range of motion. Neck supple.  Cardiovascular: Normal rate, regular rhythm and normal heart sounds.   Pulmonary/Chest: Effort normal and breath sounds normal. No respiratory distress. She has no wheezes.  Lymphadenopathy:    She has no cervical adenopathy.          Assessment & Plan:

## 2012-07-09 NOTE — Patient Instructions (Addendum)
Start the Amoxicillin twice daily for the sinus infection Use the cough syrup as needed- it will make you drowsy Continue mucinex to thin your congestion Drink plenty of fluids REST! Hang in there! Happy Holidays!!!

## 2012-08-16 ENCOUNTER — Telehealth: Payer: Self-pay | Admitting: Family Medicine

## 2012-08-16 DIAGNOSIS — E079 Disorder of thyroid, unspecified: Secondary | ICD-10-CM

## 2012-08-16 NOTE — Telephone Encounter (Signed)
refill  Levothyroxine (Tab) 175 MCG Take 1 tablet (175 mcg total) by mouth daily one time daily #30 last fill 11.14.13

## 2012-08-17 MED ORDER — LEVOTHYROXINE SODIUM 175 MCG PO TABS: 175.0000 ug | ORAL_TABLET | Freq: Every day | ORAL | Status: DC

## 2012-08-17 NOTE — Telephone Encounter (Signed)
Refill for levothyroxine sent to pharmacy.

## 2012-08-26 ENCOUNTER — Other Ambulatory Visit: Payer: Self-pay | Admitting: Family Medicine

## 2012-08-26 MED ORDER — DULOXETINE HCL 60 MG PO CPEP: 120.0000 mg | ORAL_CAPSULE | Freq: Every day | ORAL | Status: DC

## 2012-08-26 NOTE — Telephone Encounter (Signed)
Refill done.  

## 2012-08-26 NOTE — Telephone Encounter (Signed)
refill CYMBALTA 60 MG Take 2 capsules (120 mg total) by mouth daily. #60 last fill 11.21.13, last ov 11.15.13 acute

## 2012-10-16 ENCOUNTER — Other Ambulatory Visit: Payer: Self-pay | Admitting: Family Medicine

## 2012-10-28 ENCOUNTER — Other Ambulatory Visit: Payer: Self-pay | Admitting: Family Medicine

## 2012-10-29 ENCOUNTER — Telehealth: Payer: Self-pay | Admitting: Family Medicine

## 2012-10-29 NOTE — Telephone Encounter (Signed)
On-call note:  [facility] called to request RF of pt's cymbalta.  I authorized RF of 1 month's supply (60mg , 2 per day, #60, no RF).  Pt was asked to make routine f/u appt with Dr. Beverely Low before any additional RFs.

## 2012-12-30 ENCOUNTER — Encounter: Payer: Self-pay | Admitting: Family Medicine

## 2012-12-30 ENCOUNTER — Ambulatory Visit (INDEPENDENT_AMBULATORY_CARE_PROVIDER_SITE_OTHER): Payer: 59 | Admitting: Family Medicine

## 2012-12-30 VITALS — BP 120/70 | HR 122 | Temp 98.5°F | Ht 65.0 in | Wt 167.0 lb

## 2012-12-30 DIAGNOSIS — R5383 Other fatigue: Secondary | ICD-10-CM

## 2012-12-30 DIAGNOSIS — R110 Nausea: Secondary | ICD-10-CM

## 2012-12-30 DIAGNOSIS — R5381 Other malaise: Secondary | ICD-10-CM

## 2012-12-30 LAB — POCT URINE PREGNANCY: Preg Test, Ur: NEGATIVE

## 2012-12-30 MED ORDER — OMEPRAZOLE 40 MG PO CPDR: 40.0000 mg | DELAYED_RELEASE_CAPSULE | Freq: Every day | ORAL | Status: DC

## 2012-12-30 MED ORDER — ONDANSETRON 4 MG PO TBDP: 4.0000 mg | ORAL_TABLET | Freq: Three times a day (TID) | ORAL | Status: DC | PRN

## 2012-12-30 NOTE — Patient Instructions (Addendum)
We'll notify you of your lab results Start the Omeprazole daily for the increased acid Use the Zofran as needed for nausea Call with any questions or concerns Happy Mothers Day!

## 2012-12-30 NOTE — Progress Notes (Signed)
  Subjective:    Patient ID: [name], female    DOB: 1975-09-08, 37 y.o.   MRN: [id number]  HPI Nausea- initially was occuring before and after eating.  Now sxs are constant.  Occurs regardless of what pt is eating.  sxs started 2-3 weeks ago.  No recent med changes.  Pt admits to high caffeine intake.  No vomiting.  + diarrhea.  LMP 4/18.  Also having constant fatigue again.  Hx of hypothyroid.  Getting good, solid sleep but having hard time staying awake evenings and weekends.  Having occasional heartburn.   Review of Systems For ROS see HPI     Objective:   Physical Exam  Vitals reviewed. Constitutional: She is oriented to person, place, and time. She appears well-developed and well-nourished. No distress.  HENT:  Head: Normocephalic and atraumatic.  Eyes: Conjunctivae and EOM are normal. Pupils are equal, round, and reactive to light.  Neck: Normal range of motion. Neck supple. No thyromegaly present.  Cardiovascular: Normal rate, regular rhythm, normal heart sounds and intact distal pulses.   No murmur heard. Pulmonary/Chest: Effort normal and breath sounds normal. No respiratory distress.  Abdominal: Soft. She exhibits no distension and no mass. There is no tenderness. There is no rebound and no guarding.  Musculoskeletal: She exhibits no edema.  Lymphadenopathy:    She has no cervical adenopathy.  Neurological: She is alert and oriented to person, place, and time.  Skin: Skin is warm and dry.  Psychiatric: She has a normal mood and affect. Her behavior is normal.          Assessment & Plan:

## 2012-12-31 LAB — BASIC METABOLIC PANEL
BUN: 16 mg/dL (ref 6–23)
CO2: 25 mEq/L (ref 19–32)
Calcium: 8.8 mg/dL (ref 8.4–10.5)
Chloride: 105 mEq/L (ref 96–112)
Creatinine, Ser: 0.8 mg/dL (ref 0.4–1.2)
GFR: 88.02 mL/min (ref >60.00)
Glucose, Bld: 114 mg/dL — ABNORMAL HIGH (ref 70–99)
Potassium: 3.9 mEq/L (ref 3.5–5.1)
Sodium: 138 mEq/L (ref 135–145)

## 2012-12-31 LAB — HEPATIC FUNCTION PANEL
ALT: 23 U/L (ref 0–35)
AST: 23 U/L (ref 0–37)
Albumin: 3.9 g/dL (ref 3.5–5.2)
Alkaline Phosphatase: 80 U/L (ref 39–117)
Bilirubin, Direct: 0.1 mg/dL (ref 0.0–0.3)
Total Bilirubin: 0.4 mg/dL (ref 0.3–1.2)
Total Protein: 6.9 g/dL (ref 6.0–8.3)

## 2012-12-31 LAB — CBC WITH DIFFERENTIAL/PLATELET
Basophils Absolute: 0 10*3/uL (ref 0.0–0.1)
Basophils Relative: 0.3 % (ref 0.0–3.0)
Eosinophils Absolute: 0.1 10*3/uL (ref 0.0–0.7)
Eosinophils Relative: 1.6 % (ref 0.0–5.0)
HCT: 39.4 % (ref 36.0–46.0)
Hemoglobin: 13.5 g/dL (ref 12.0–15.0)
Lymphocytes Relative: 18.2 % (ref 12.0–46.0)
Lymphs Abs: 1.5 10*3/uL (ref 0.7–4.0)
MCHC: 34.2 g/dL (ref 30.0–36.0)
MCV: 91.2 fl (ref 78.0–100.0)
Monocytes Absolute: 0.3 10*3/uL (ref 0.1–1.0)
Monocytes Relative: 3.2 % (ref 3.0–12.0)
Neutro Abs: 6.4 10*3/uL (ref 1.4–7.7)
Neutrophils Relative %: 76.7 % (ref 43.0–77.0)
Platelets: 290 10*3/uL (ref 150.0–400.0)
RBC: 4.32 Mil/uL (ref 3.87–5.11)
RDW: 13 % (ref 11.5–14.6)
WBC: 8.4 10*3/uL (ref 4.5–10.5)

## 2012-12-31 LAB — AMYLASE: Amylase: 59 U/L (ref 27–131)

## 2012-12-31 LAB — H. PYLORI ANTIBODY, IGG: H Pylori IgG: NEGATIVE

## 2012-12-31 LAB — LIPASE: Lipase: 15 U/L (ref 11.0–59.0)

## 2012-12-31 LAB — TSH: TSH: 0.45 u[IU]/mL (ref 0.35–5.50)

## 2013-01-03 ENCOUNTER — Telehealth: Payer: Self-pay | Admitting: *Deleted

## 2013-01-03 NOTE — Telephone Encounter (Signed)
Patient returned our call, I advised of recent lab results.

## 2013-01-07 ENCOUNTER — Telehealth: Payer: Self-pay | Admitting: Family Medicine

## 2013-01-07 DIAGNOSIS — R110 Nausea: Secondary | ICD-10-CM

## 2013-01-07 NOTE — Telephone Encounter (Signed)
Spoke with the pt and informed her of Dr. Rennis Golden note and recommendation below regarding the nausea.  Pt understood and would like to see GI.  Referral ordered and sent .//AB/CMA

## 2013-01-07 NOTE — Telephone Encounter (Signed)
Pt called stating she is still having nausea and is leaving work. She would like to know if there is anything she can do. [facility] uses [facility] [address]

## 2013-01-07 NOTE — Telephone Encounter (Signed)
If sxs are not controlled w/ acid reducer and Zofran, will need GI appt

## 2013-01-09 NOTE — Assessment & Plan Note (Signed)
Recurrent problem.  Hx of hypothyroid.  Check levels.  Adjust meds prn.

## 2013-01-09 NOTE — Assessment & Plan Note (Addendum)
New.  Upreg negative.  Increased GERD sxs- start PPI.  Check labs to r/o h pylori, pancreatitis, infxn, liver dysfxn.  Zofran prn.  Will follow.

## 2013-01-10 ENCOUNTER — Encounter: Payer: Self-pay | Admitting: Internal Medicine

## 2013-01-10 ENCOUNTER — Other Ambulatory Visit: Payer: Self-pay | Admitting: Family Medicine

## 2013-01-11 NOTE — Telephone Encounter (Signed)
Last refill:12/13/12 Last CPE:05/19/12 Please advise.//AB/CMA

## 2013-01-14 ENCOUNTER — Telehealth: Payer: Self-pay | Admitting: Family Medicine

## 2013-01-14 MED ORDER — PREGABALIN 75 MG PO CAPS: 75.0000 mg | ORAL_CAPSULE | Freq: Two times a day (BID) | ORAL | Status: DC

## 2013-01-14 NOTE — Telephone Encounter (Signed)
Patient is calling to see if her pain medication, Meloxicam, can be increased. She is also asking for her Fibromyalgia medication to be switched to Lyrica. Patient uses [facility] off [address].

## 2013-01-14 NOTE — Telephone Encounter (Signed)
Legs have really been hurting pt lately, states she has even been trying tanning to help alleviate the symptoms. Pt states that she tried taking two of her tablets and still no relief. . There was no answer, LMOVM for pt to respond. Please advise if switching meds would be a possibility. Also pt states that she still has nausea, has a GI appt on 02-01-13. Please advise.

## 2013-01-14 NOTE — Telephone Encounter (Signed)
Pt notified med called in

## 2013-01-14 NOTE — Telephone Encounter (Signed)
Pt cannot take more than 1 Mobic daily- this is the max dose and more than that can harm the kidneys She should continue her Cymbalta as directed We can add Lyrica 75mg  BID for pain.  #60, 3 refills.  If no improvement on this med, will need to f/u w/ psych or get a rheum appt

## 2013-01-27 ENCOUNTER — Encounter: Payer: Self-pay | Admitting: Internal Medicine

## 2013-02-01 ENCOUNTER — Ambulatory Visit (INDEPENDENT_AMBULATORY_CARE_PROVIDER_SITE_OTHER): Payer: 59 | Admitting: Internal Medicine

## 2013-02-01 ENCOUNTER — Encounter: Payer: Self-pay | Admitting: Internal Medicine

## 2013-02-01 VITALS — BP 112/76 | HR 102 | Ht 66.0 in | Wt 169.4 lb

## 2013-02-01 DIAGNOSIS — K625 Hemorrhage of anus and rectum: Secondary | ICD-10-CM

## 2013-02-01 DIAGNOSIS — R110 Nausea: Secondary | ICD-10-CM

## 2013-02-01 DIAGNOSIS — K589 Irritable bowel syndrome without diarrhea: Secondary | ICD-10-CM

## 2013-02-01 DIAGNOSIS — R6881 Early satiety: Secondary | ICD-10-CM

## 2013-02-01 DIAGNOSIS — K3189 Other diseases of stomach and duodenum: Secondary | ICD-10-CM

## 2013-02-01 DIAGNOSIS — R1013 Epigastric pain: Secondary | ICD-10-CM

## 2013-02-01 MED ORDER — RESTORA PO CAPS: 1.0000 | ORAL_CAPSULE | Freq: Every day | ORAL | Status: DC

## 2013-02-01 NOTE — Progress Notes (Signed)
Patient ID: [name], female   DOB: July 28, 1976, 37 y.o.   MRN: [id number] HPI: [name] is a 37 yo female with PMH of fibromyalgia, irritable bowel, hypothyroidism, anxiety who is seen in consultation at the request of Dr. Beverely Low her evaluation of nausea also with rectal bleeding. She is alone today. The patient reports she developed nausea in early May 2014. This was associated with excessive salivation and she reports was moderate to severe in intensity. She did not have vomiting. She notes that no new medications were started around the time of her nausea. Since then she did start Lyrica within the last several weeks for her fibromyalgia. The nodule is constant and not necessarily related to eating. She did have anorexia with this nausea and also some early satiety. She reports she "did not really" have heartburn. No dysphagia or odynophagia. She reports a long-standing history of alternating bowel habits. She reports she can have days where she has significant loose stools worse postprandially and other times be constipated. In the past this has been diagnosed as irritable bowel. She does report abdominal bloating. Over the last several weeks she has seen red blood in her stool and also on the toilet tissue. This is not significant and painful. She denies melena. She does endorse abdominal bloating. She does take meloxicam for fibromyalgia pain and had a dose increase at some point in May 2014.  She was started on omeprazole 40 mg daily by her primary care provider reports this helped her nausea by about 50%. The Zofran prescribed for nausea did not help and so she is not using it.  She recalls an upper endoscopy and colonoscopy performed greater than 13 years ago in [place]. She recalls these being normal and she was told she has IBS  Patient Active Problem List   Diagnosis Date Noted  . Nausea alone 12/30/2012  . General medical examination 05/20/2011  . SINUSITIS - ACUTE-NOS 10/03/2010  .  FOOT PAIN, LEFT 07/15/2010  . KNEE PAIN, LEFT 05/17/2010  . ANXIETY STATE, UNSPECIFIED 04/19/2008  . B12 DEFICIENCY 03/17/2008  . FATIGUE 02/14/2008  . HYPOTHYROIDISM 07/26/2007  . FIBROMYALGIA 07/26/2007  . IRRITABLE BOWEL SYNDROME, HX OF 07/26/2007    Past Surgical History  Procedure Laterality Date  . Cesarean section      x 2    Current Outpatient Prescriptions  Medication Sig Dispense Refill  . ABILIFY 5 MG tablet TAKE ONE TABLET BY MOUTH ONE TIME DAILY  30 tablet  3  . DULoxetine (CYMBALTA) 60 MG capsule TAKE TWO CAPSULES BY MOUTH DAILY  60 capsule  5  . levothyroxine (SYNTHROID) 175 MCG tablet Take 1 tablet (175 mcg total) by mouth daily.  30 tablet  5  . meloxicam (MOBIC) 15 MG tablet TAKE 1/2 TO 1 TABLET BY MOUTH ONCE DAILY AS NEEDED FOR PAIN  30 tablet  5  . omeprazole (PRILOSEC) 40 MG capsule Take 1 capsule (40 mg total) by mouth daily.  30 capsule  3  . ondansetron (ZOFRAN ODT) 4 MG disintegrating tablet Take 1 tablet (4 mg total) by mouth every 8 (eight) hours as needed for nausea.  30 tablet  0  . pregabalin (LYRICA) 75 MG capsule Take 1 capsule (75 mg total) by mouth 2 (two) times daily.  60 capsule  3  . Probiotic Product (RESTORA) CAPS Take 1 capsule by mouth daily.  30 capsule  1   No current facility-administered medications for this visit.    Allergies  Allergen Reactions  .  Bupropion Hcl     REACTION: no benefit  . Fluoxetine Hcl     REACTION: depression  . Levothyroxine Sodium     REACTION: inadequate response  . Methimazole     Family History  Problem Relation Age of Onset  . Diabetes Paternal Grandmother   . ADD / ADHD Son     History  Substance Use Topics  . Smoking status: Never Smoker   . Smokeless tobacco: Not on file  . Alcohol Use: Yes    ROS: As per history of present illness, otherwise negative  BP 112/76  Pulse 102  Ht 5\' 6"  (1.676 m)  Wt 169 lb 6.4 oz (76.839 kg)  BMI 27.35 kg/m2  SpO2 98% Constitutional: Well-developed  and well-nourished. No distress. HEENT: Normocephalic and atraumatic. Oropharynx is clear and moist. No oropharyngeal exudate. Conjunctivae are normal.  No scleral icterus. Neck: Neck supple. Trachea midline. Cardiovascular: Normal rate, regular rhythm and intact distal pulses. No M/R/G Pulmonary/chest: Effort normal and breath sounds normal. No wheezing, rales or rhonchi. Abdominal: Soft, nontender, nondistended. Bowel sounds active throughout. There are no masses palpable. No hepatosplenomegaly. Extremities: no clubbing, cyanosis, or edema Lymphadenopathy: No cervical adenopathy noted. Neurological: Alert and oriented to person place and time. Skin: Skin is warm and dry. No rashes noted. Psychiatric: Normal mood and affect. Behavior is normal.  RELEVANT LABS AND IMAGING: CBC    Component Value Date/Time   WBC 8.4 12/30/2012 1451   RBC 4.32 12/30/2012 1451   HGB 13.5 12/30/2012 1451   HCT 39.4 12/30/2012 1451   PLT 290.0 12/30/2012 1451   MCV 91.2 12/30/2012 1451   MCHC 34.2 12/30/2012 1451   RDW 13.0 12/30/2012 1451   LYMPHSABS 1.5 12/30/2012 1451   MONOABS 0.3 12/30/2012 1451   EOSABS 0.1 12/30/2012 1451   BASOSABS 0.0 12/30/2012 1451    CMP     Component Value Date/Time   NA 138 12/30/2012 1451   K 3.9 12/30/2012 1451   CL 105 12/30/2012 1451   CO2 25 12/30/2012 1451   GLUCOSE 114* 12/30/2012 1451   BUN 16 12/30/2012 1451   CREATININE 0.8 12/30/2012 1451   CALCIUM 8.8 12/30/2012 1451   PROT 6.9 12/30/2012 1451   ALBUMIN 3.9 12/30/2012 1451   AST 23 12/30/2012 1451   ALT 23 12/30/2012 1451   ALKPHOS 80 12/30/2012 1451   BILITOT 0.4 12/30/2012 1451   GFRNONAA 103 02/16/2008 0840   GFRAA 125 02/16/2008 0840   H. pylori IgG - negative TSH - normal Amylase lipase - normal  ASSESSMENT/PLAN: 37 yo female with PMH of fibromyalgia, irritable bowel, hypothyroidism, anxiety who is seen in consultation at the request of Dr. Beverely Low her evaluation of nausea also with rectal bleeding.  1.  Nausea/early satiety -- her  symptoms are more dyspeptic in nature than gastroesophageal reflux disease. She has benefited, though incompletely to the addition of PPI therapy. Given these persistent symptoms, I recommended upper endoscopy for further evaluation. She would be at risk for gastroduodenitis or ulcer disease secondary to NSAIDs. Further recommendations to be made after endoscopy, but for now I have recommended she continue with omeprazole 40 mg daily. Another anti-emetic was offered, but she declines until after endoscopy.  2.  IBS -- long-standing history of irritable bowel syndrome and we discussed this today. It is also associated with other conditions such as fibromyalgia. Her alternating bowel habits and bloating are likely irritable in nature, though her rectal bleeding is not. Have recommended a probiotic for her IBS  and have given her samples of Restora.  She is also given a copy of the FODMAPs diet which can help with irritable bowel as well as gas and bloating. Her rectal bleeding which is not associated with irritable bowel will be evaluated as discussed in #3  3.  Rectal bleeding -- certainly this could be anorectal or hemorrhoidal in nature, but given her other symptoms, I recommended flexible sigmoidoscopy to rule out other pathology such as inflammatory bowel disease. The test was discussed including the risks and benefits and she is agreeable to proceed.

## 2013-02-01 NOTE — Patient Instructions (Addendum)
You have been scheduled for an endoscopy/Flexible sigmoidoscopy with propofol. Please follow written instructions given to you at your visit today. If you use inhalers (even only as needed), please bring them with you on the day of your procedure. Your physician has requested that you go to www.startemmi.com and enter the access code given to you at your visit today. This web site gives a general overview about your procedure. However, you should still follow specific instructions given to you by our office regarding your preparation for the procedure.   You have been given a FODMAP diet to follow  We have given you samples of Restora. This puts good bacteria back into your intestines. You should take 1 capsule by mouth once daily. If this works well for you, it can be purchased over the counter.  Continue taking your omeprazole.                                               We are excited to introduce MyChart, a new best-in-class service that provides you online access to important information in your electronic medical record. We want to make it easier for you to view your health information - all in one secure location - when and where you need it. We expect MyChart will enhance the quality of care and service we provide.  When you register for MyChart, you can:    View your test results.    Request appointments and receive appointment reminders via email.    Request medication renewals.    View your medical history, allergies, medications and immunizations.    Communicate with your physician's office through a password-protected site.    Conveniently print information such as your medication lists.  To find out if MyChart is right for you, please talk to a member of our clinical staff today. We will gladly answer your questions about this free health and wellness tool.  If you are age 45 or older and want a member of your family to have access to your record, you must provide  written consent by completing a proxy form available at our office. Please speak to our clinical staff about guidelines regarding accounts for patients younger than age 65.  As you activate your MyChart account and need any technical assistance, please call the MyChart technical support line at [phone]) or email your question to [email or web address]. If you email your question(s), please include your name, a return phone number and the best time to reach you.  If you have non-urgent health-related questions, you can send a message to our office through MyChart at [email or web address]. If you have a medical emergency, call 911.  Thank you for using MyChart as your new health and wellness resource!   MyChart licensed from [employer],  [id number]. Patents Pending.

## 2013-02-12 ENCOUNTER — Other Ambulatory Visit: Payer: Self-pay | Admitting: Family Medicine

## 2013-02-13 ENCOUNTER — Other Ambulatory Visit: Payer: Self-pay | Admitting: Family Medicine

## 2013-02-14 ENCOUNTER — Encounter: Payer: 59 | Admitting: Internal Medicine

## 2013-02-16 ENCOUNTER — Other Ambulatory Visit: Payer: Self-pay | Admitting: *Deleted

## 2013-02-16 DIAGNOSIS — E079 Disorder of thyroid, unspecified: Secondary | ICD-10-CM

## 2013-02-16 MED ORDER — LEVOTHYROXINE SODIUM 175 MCG PO TABS: 175.0000 ug | ORAL_TABLET | Freq: Every day | ORAL | Status: DC

## 2013-02-16 NOTE — Telephone Encounter (Signed)
Refill for synthroid sent to [facility] on [address].

## 2013-03-14 ENCOUNTER — Telehealth: Payer: Self-pay | Admitting: Family Medicine

## 2013-03-14 DIAGNOSIS — M797 Fibromyalgia: Secondary | ICD-10-CM

## 2013-03-14 NOTE — Telephone Encounter (Signed)
Pt called to see about getting a referral to an endocrinologist.

## 2013-03-15 NOTE — Telephone Encounter (Signed)
Spoke with the pt and informed her that Dr. Tawni Carnes the referral to the Rheum for her fibromyalgia.  Pt agreed.  New referral ordered and sent.//AB/CMA

## 2013-03-15 NOTE — Telephone Encounter (Signed)
Ok for Rheum referral for fibro

## 2013-03-15 NOTE — Telephone Encounter (Signed)
Called patient and she stated that she would like a referral to rheumatology for her fibromyalgia. GF/RN

## 2013-04-18 ENCOUNTER — Encounter: Payer: Self-pay | Admitting: Nurse Practitioner

## 2013-04-18 ENCOUNTER — Ambulatory Visit (INDEPENDENT_AMBULATORY_CARE_PROVIDER_SITE_OTHER): Payer: 59 | Admitting: Nurse Practitioner

## 2013-04-18 ENCOUNTER — Ambulatory Visit: Payer: 59 | Admitting: Family Medicine

## 2013-04-18 VITALS — BP 110/74 | HR 106 | Temp 98.3°F | Ht 65.0 in | Wt 178.2 lb

## 2013-04-18 DIAGNOSIS — H6092 Unspecified otitis externa, left ear: Secondary | ICD-10-CM

## 2013-04-18 DIAGNOSIS — H60399 Other infective otitis externa, unspecified ear: Secondary | ICD-10-CM

## 2013-04-18 MED ORDER — NEOMYCIN-POLYMYXIN-HC 1 % OT SOLN: 3.0000 [drp] | Freq: Three times a day (TID) | OTIC | Status: AC

## 2013-04-18 NOTE — Patient Instructions (Signed)
Keep ear dry-no swimming, use cotton ball when getting in shower. Please return of symptoms do not improve in next few days. You may continue to use ibuprophen for pain. Feel better!   Otitis Externa Otitis externa is a bacterial or fungal infection of the outer ear canal. This is the area from the eardrum to the outside of the ear. Otitis externa is sometimes called "swimmer's ear." CAUSES  Possible causes of infection include:  Swimming in dirty water.  Moisture remaining in the ear after swimming or bathing.  Mild injury (trauma) to the ear.  Objects stuck in the ear (foreign body).  Cuts or scrapes (abrasions) on the outside of the ear. SYMPTOMS  The first symptom of infection is often itching in the ear canal. Later signs and symptoms may include swelling and redness of the ear canal, ear pain, and yellowish-white fluid (pus) coming from the ear. The ear pain may be worse when pulling on the earlobe. DIAGNOSIS  Your caregiver will perform a physical exam. A sample of fluid may be taken from the ear and examined for bacteria or fungi. TREATMENT  Antibiotic ear drops are often given for 10 to 14 days. Treatment may also include pain medicine or corticosteroids to reduce itching and swelling. PREVENTION   Keep your ear dry. Use the corner of a towel to absorb water out of the ear canal after swimming or bathing.  Avoid scratching or putting objects inside your ear. This can damage the ear canal or remove the protective wax that lines the canal. This makes it easier for bacteria and fungi to grow.  Avoid swimming in lakes, polluted water, or poorly chlorinated pools.  You may use ear drops made of rubbing alcohol and vinegar after swimming. Combine equal parts of white vinegar and alcohol in a bottle. Put 3 or 4 drops into each ear after swimming. HOME CARE INSTRUCTIONS   Apply antibiotic ear drops to the ear canal as prescribed by your caregiver.  Only take over-the-counter or  prescription medicines for pain, discomfort, or fever as directed by your caregiver.  If you have diabetes, follow any additional treatment instructions from your caregiver.  Keep all follow-up appointments as directed by your caregiver. SEEK MEDICAL CARE IF:   You have a fever.  Your ear is still red, swollen, painful, or draining pus after 3 days.  Your redness, swelling, or pain gets worse.  You have a severe headache.  You have redness, swelling, pain, or tenderness in the area behind your ear. MAKE SURE YOU:   Understand these instructions.  Will watch your condition.  Will get help right away if you are not doing well or get worse. Document Released: 08/11/2005 Document Revised: 11/03/2011 Document Reviewed: 08/28/2011 [facility] Patient Information 2014 [place], [place].

## 2013-04-18 NOTE — Progress Notes (Signed)
  Subjective:    Patient ID: [name], female    DOB: 09/20/1975, 37 y.o.   MRN: [id number]  Otalgia  There is pain in the left ear. This is a new problem. The current episode started in the past 7 days. Episode frequency: ear was swollen & itchy yesterday, today much improved. The problem has been gradually improving. There has been no fever. The pain is mild. Associated symptoms include a rash (ears occasionally have scaly itchy rash). Pertinent negatives include no abdominal pain, coughing, diarrhea, drainage, ear discharge, headaches, hearing loss, rhinorrhea or sore throat. She has tried NSAIDs for the symptoms. The treatment provided significant relief. There is no history of a chronic ear infection. has scaly rash in canal that comes & goes.      Review of Systems  Constitutional: Negative for fever, chills, activity change, appetite change and fatigue.  HENT: Positive for ear pain. Negative for hearing loss, congestion, sore throat, rhinorrhea, tinnitus and ear discharge.   Eyes: Negative for redness.  Respiratory: Negative for cough.   Gastrointestinal: Negative for abdominal pain and diarrhea.  Skin: Positive for rash (ears occasionally have scaly itchy rash).  Neurological: Negative for headaches.       Objective:   Physical Exam  Vitals reviewed. Constitutional: She is oriented to person, place, and time. She appears well-developed and well-nourished. No distress.  HENT:  Head: Normocephalic and atraumatic.  Right Ear: External ear normal.  Mouth/Throat: No oropharyngeal exudate.  Eyes: Conjunctivae are normal.  Cardiovascular: Normal rate.   Pulmonary/Chest: Effort normal.  Lymphadenopathy:    She has cervical adenopathy (preauricular LAD Left).  Neurological: She is alert and oriented to person, place, and time.  Skin: Skin is warm and dry.  Psychiatric: She has a normal mood and affect. Her behavior is normal. Thought content normal.  Pt states she picks at rash,  stating she has OCD & cannot stand to have scab. She takes fluoxetine & buproprion          Assessment & Plan:  1. Otitis externa of left ear DD: psoriasis - NEOMYCIN-POLYMYXIN-HYDROCORTISONE (CORTISPORIN) 1 % SOLN otic solution; Place 3 drops into the left ear every 8 (eight) hours.  Dispense: 10 mL; Refill: 0

## 2013-04-20 ENCOUNTER — Telehealth: Payer: Self-pay | Admitting: Family Medicine

## 2013-04-20 DIAGNOSIS — M797 Fibromyalgia: Secondary | ICD-10-CM

## 2013-04-20 NOTE — Telephone Encounter (Signed)
04/22/2013  Pt went to see [facility] today, had problems with payment, got there 15 min early, wouldn't take flex card, only had [employer] and they dont take [employer].  By then, had been over 15 mins past appt and they refused to see her.  Vance Gather requesting referral to another provider.  Please advise.  bw

## 2013-04-22 NOTE — Telephone Encounter (Signed)
Ok to refer to new rheum

## 2013-04-22 NOTE — Telephone Encounter (Signed)
Please advise if ok for a new referral.

## 2013-04-22 NOTE — Telephone Encounter (Signed)
New referral placed.

## 2013-04-27 ENCOUNTER — Telehealth: Payer: Self-pay | Admitting: Family Medicine

## 2013-04-27 DIAGNOSIS — IMO0001 Reserved for inherently not codable concepts without codable children: Secondary | ICD-10-CM

## 2013-04-27 NOTE — Telephone Encounter (Signed)
Referral placed. Pt notified.  

## 2013-04-27 NOTE — Telephone Encounter (Signed)
Needs rheumatology referral- let pt know that her preferred provider is not accepting new pts at this time

## 2013-04-27 NOTE — Telephone Encounter (Signed)
[facility] is calling to let us know that they will not be able to accept the patient's referral because they are not taking any new Fibromyalgia patient's at this time.

## 2013-04-27 NOTE — Telephone Encounter (Signed)
Please advise what next steps should be taken.

## 2013-05-10 ENCOUNTER — Ambulatory Visit: Payer: 59 | Admitting: Family Medicine

## 2013-05-27 ENCOUNTER — Ambulatory Visit: Payer: 59 | Admitting: Family Medicine

## 2013-05-27 DIAGNOSIS — Z0289 Encounter for other administrative examinations: Secondary | ICD-10-CM

## 2013-06-17 ENCOUNTER — Other Ambulatory Visit: Payer: Self-pay | Admitting: Family Medicine

## 2013-06-17 NOTE — Telephone Encounter (Signed)
Med filled.  

## 2013-08-13 ENCOUNTER — Other Ambulatory Visit: Payer: Self-pay | Admitting: Family Medicine

## 2013-08-15 NOTE — Telephone Encounter (Signed)
Med filled.  

## 2013-09-15 ENCOUNTER — Other Ambulatory Visit: Payer: Self-pay | Admitting: Family Medicine

## 2013-09-15 NOTE — Telephone Encounter (Signed)
Med filled #30 with 0 pt needs OV.

## 2013-10-16 ENCOUNTER — Other Ambulatory Visit: Payer: Self-pay | Admitting: Family Medicine

## 2013-10-18 NOTE — Telephone Encounter (Signed)
Med filled due to appt on 3/4.

## 2013-10-26 ENCOUNTER — Encounter: Payer: Self-pay | Admitting: Family Medicine

## 2013-10-26 ENCOUNTER — Ambulatory Visit: Payer: 59 | Admitting: Family Medicine

## 2013-10-26 ENCOUNTER — Ambulatory Visit (INDEPENDENT_AMBULATORY_CARE_PROVIDER_SITE_OTHER): Payer: 59 | Admitting: Family Medicine

## 2013-10-26 VITALS — BP 116/80 | HR 102 | Temp 98.2°F | Resp 16 | Wt 172.1 lb

## 2013-10-26 DIAGNOSIS — Z79899 Other long term (current) drug therapy: Secondary | ICD-10-CM | POA: Insufficient documentation

## 2013-10-26 DIAGNOSIS — E039 Hypothyroidism, unspecified: Secondary | ICD-10-CM

## 2013-10-26 DIAGNOSIS — IMO0001 Reserved for inherently not codable concepts without codable children: Secondary | ICD-10-CM

## 2013-10-26 LAB — BASIC METABOLIC PANEL
BUN: 14 mg/dL (ref 6–23)
CO2: 27 mEq/L (ref 19–32)
Calcium: 9.2 mg/dL (ref 8.4–10.5)
Chloride: 107 mEq/L (ref 96–112)
Creatinine, Ser: 0.7 mg/dL (ref 0.4–1.2)
GFR: 97.68 mL/min (ref >60.00)
Glucose, Bld: 109 mg/dL — ABNORMAL HIGH (ref 70–99)
Potassium: 3.9 mEq/L (ref 3.5–5.1)
Sodium: 140 mEq/L (ref 135–145)

## 2013-10-26 LAB — LIPID PANEL
Cholesterol: 120 mg/dL (ref 0–200)
HDL: 42.4 mg/dL (ref >39.00)
LDL Cholesterol: 61 mg/dL (ref 0–99)
Total CHOL/HDL Ratio: 3
Triglycerides: 84 mg/dL (ref 0.0–149.0)
VLDL: 16.8 mg/dL (ref 0.0–40.0)

## 2013-10-26 LAB — HEPATIC FUNCTION PANEL
ALT: 22 U/L (ref 0–35)
AST: 24 U/L (ref 0–37)
Albumin: 3.9 g/dL (ref 3.5–5.2)
Alkaline Phosphatase: 68 U/L (ref 39–117)
Bilirubin, Direct: 0 mg/dL (ref 0.0–0.3)
Total Bilirubin: 0.5 mg/dL (ref 0.3–1.2)
Total Protein: 6.8 g/dL (ref 6.0–8.3)

## 2013-10-26 LAB — TSH: TSH: 0.13 u[IU]/mL — ABNORMAL LOW (ref 0.35–5.50)

## 2013-10-26 NOTE — Assessment & Plan Note (Signed)
Pt feeling much better since establishing w/ Rheumatology and adjusting medications.  Pt is on both SSRI and SNRI in addition to abilify.  Reviewed sxs of serotonin syndrome as a precaution.

## 2013-10-26 NOTE — Assessment & Plan Note (Signed)
Chronic problem.  Check labs.  Adjust meds prn  

## 2013-10-26 NOTE — Assessment & Plan Note (Signed)
Pt is tolerating abilify w/o difficulty and so far has had no adverse effects.  Check labs.

## 2013-10-26 NOTE — Progress Notes (Signed)
   Subjective:    Patient ID: [name], female    DOB: 01/11/1976, 38 y.o.   MRN: [id number]  HPI Fibromyalgia- seeing Rheum (Dr Herma CarsonZ, [facility]).  Feeling 'really good'.  Off Lyrica.  Now on Lexapro, Cymbalta, Abilify.  Still w/ some fatigue.  Has joined [employer] and lost 11 lbs and 10 inches.  Has not had glucose, liver functions or cholesterol checked recently despite being on abilify.   Review of Systems For ROS see HPI     Objective:   Physical Exam  Vitals reviewed. Constitutional: She is oriented to person, place, and time. She appears well-developed and well-nourished. No distress.  HENT:  Head: Normocephalic and atraumatic.  Eyes: Conjunctivae and EOM are normal. Pupils are equal, round, and reactive to light.  Neck: Normal range of motion. Neck supple. No thyromegaly present.  Cardiovascular: Normal rate, regular rhythm, normal heart sounds and intact distal pulses.   No murmur heard. Pulmonary/Chest: Effort normal and breath sounds normal. No respiratory distress.  Abdominal: Soft. She exhibits no distension. There is no tenderness.  Musculoskeletal: She exhibits no edema.  Lymphadenopathy:    She has no cervical adenopathy.  Neurological: She is alert and oriented to person, place, and time.  Skin: Skin is warm and dry.  Psychiatric: She has a normal mood and affect. Her behavior is normal.          Assessment & Plan:

## 2013-10-26 NOTE — Progress Notes (Signed)
Pre visit review using our clinic review tool, if applicable. No additional management support is needed unless otherwise documented below in the visit note. 

## 2013-10-26 NOTE — Patient Instructions (Signed)
Schedule your complete physical in 6 months We'll notify you of your lab results and make any changes if needed Keep up the good work!  You look great! [place]!!

## 2013-10-27 ENCOUNTER — Other Ambulatory Visit: Payer: Self-pay | Admitting: General Practice

## 2013-10-27 MED ORDER — LEVOTHYROXINE SODIUM 150 MCG PO TABS: 150.0000 ug | ORAL_TABLET | Freq: Every day | ORAL | Status: DC

## 2013-12-17 ENCOUNTER — Other Ambulatory Visit: Payer: Self-pay | Admitting: Family Medicine

## 2013-12-19 NOTE — Telephone Encounter (Signed)
Med filled.  

## 2014-03-01 ENCOUNTER — Other Ambulatory Visit: Payer: Self-pay | Admitting: Family Medicine

## 2014-03-01 NOTE — Telephone Encounter (Signed)
Med filled.  

## 2014-03-03 ENCOUNTER — Ambulatory Visit: Payer: 59 | Admitting: Family Medicine

## 2014-03-21 ENCOUNTER — Other Ambulatory Visit: Payer: Self-pay | Admitting: General Practice

## 2014-03-21 MED ORDER — ARIPIPRAZOLE 5 MG PO TABS: ORAL_TABLET | ORAL | Status: DC

## 2014-05-29 ENCOUNTER — Other Ambulatory Visit: Payer: Self-pay | Admitting: Family Medicine

## 2014-05-29 NOTE — Telephone Encounter (Signed)
Med filled and letter mailed for thyroid follow up appt.

## 2014-06-16 ENCOUNTER — Ambulatory Visit (INDEPENDENT_AMBULATORY_CARE_PROVIDER_SITE_OTHER): Payer: 59 | Admitting: Family Medicine

## 2014-06-16 ENCOUNTER — Encounter: Payer: Self-pay | Admitting: General Practice

## 2014-06-16 ENCOUNTER — Encounter: Payer: Self-pay | Admitting: Family Medicine

## 2014-06-16 VITALS — BP 122/82 | HR 103 | Temp 98.2°F | Resp 16 | Wt 178.5 lb

## 2014-06-16 DIAGNOSIS — E032 Hypothyroidism due to medicaments and other exogenous substances: Secondary | ICD-10-CM

## 2014-06-16 DIAGNOSIS — F411 Generalized anxiety disorder: Secondary | ICD-10-CM

## 2014-06-16 LAB — T4, FREE: Free T4: 1.12 ng/dL (ref 0.60–1.60)

## 2014-06-16 LAB — T3, FREE: T3, Free: 3.2 pg/mL (ref 2.3–4.2)

## 2014-06-16 LAB — TSH: TSH: 2.72 u[IU]/mL (ref 0.35–4.50)

## 2014-06-16 NOTE — Patient Instructions (Signed)
Schedule your complete physical in 6 months Call Dr Herma CarsonZ about increasing the Zoloft- I think it would help! We'll notify you of your thyroid labs and make adjustments as needed Keep up the good work! Call with any questions or concerns Happy Fall!!!

## 2014-06-16 NOTE — Assessment & Plan Note (Signed)
Encouraged pt to call psych and discuss increasing Zoloft as she is at a low dose.  Also reviewed that fatigue is a possible side effect of Zoloft and if the fatigue worsens, rather than improves, on the higher dose of Zoloft- she should discuss switching to something else.  Pt expressed understanding and is in agreement w/ plan.

## 2014-06-16 NOTE — Progress Notes (Signed)
   Subjective:    Patient ID: [name], female    DOB: 25-Dec-1975, 38 y.o.   MRN: [id number]  HPI Hypothyroid- chronic problem, dose was adjusted at last visit.  Pt still reports anxiety.  Denies palpitations, heat/cold intolerance, bowel irregularity, changes to skin/hair/nails  Anxiety- pt transitioned to a new job in July.  This has actually lessened anxiety.  Currently on Zoloft, Abilify.  Currently seeing Dr Herma CarsonZ (psych).  Not seeing a therapist- 'i don't have time'.   Review of Systems For ROS see HPI     Objective:   Physical Exam  Vitals reviewed. Constitutional: She is oriented to person, place, and time. She appears well-developed and well-nourished. No distress.  HENT:  Head: Normocephalic and atraumatic.  Eyes: Conjunctivae and EOM are normal. Pupils are equal, round, and reactive to light.  Neck: Normal range of motion. Neck supple. No thyromegaly present.  Cardiovascular: Normal rate, regular rhythm, normal heart sounds and intact distal pulses.   No murmur heard. Pulmonary/Chest: Effort normal and breath sounds normal. No respiratory distress.  Musculoskeletal: She exhibits no edema.  Lymphadenopathy:    She has no cervical adenopathy.  Neurological: She is alert and oriented to person, place, and time.  Skin: Skin is warm and dry.  Psychiatric: She has a normal mood and affect. Her behavior is normal.          Assessment & Plan:

## 2014-06-16 NOTE — Progress Notes (Signed)
Pre visit review using our clinic review tool, if applicable. No additional management support is needed unless otherwise documented below in the visit note. 

## 2014-06-16 NOTE — Assessment & Plan Note (Signed)
Chronic problem.  Pt's meds were adjusted at last visit due to low TSH but pt did not return for f/u.  Continues to struggle w/ fatigue but she associates this w/ her depression/anxiety.  Check labs.  Adjust meds prn.  Reviewed supportive care and red flags that should prompt return.  Pt expressed understanding and is in agreement w/ plan.

## 2014-06-21 ENCOUNTER — Other Ambulatory Visit: Payer: Self-pay | Admitting: General Practice

## 2014-06-21 MED ORDER — ARIPIPRAZOLE 5 MG PO TABS: ORAL_TABLET | ORAL | Status: DC

## 2014-07-26 ENCOUNTER — Other Ambulatory Visit: Payer: Self-pay | Admitting: Family Medicine

## 2014-07-26 ENCOUNTER — Telehealth: Payer: Self-pay | Admitting: Family Medicine

## 2014-07-26 MED ORDER — ARIPIPRAZOLE 5 MG PO TABS: 5.0000 mg | ORAL_TABLET | Freq: Every day | ORAL | Status: DC

## 2014-07-26 NOTE — Telephone Encounter (Signed)
They are the same med- just brand name vs generic.  Ok to switch to generic, same dose and sig

## 2014-07-26 NOTE — Telephone Encounter (Signed)
Ok to change medications? If so, please advise on dose and sig.

## 2014-07-26 NOTE — Telephone Encounter (Signed)
Med filled as generic.

## 2014-07-26 NOTE — Telephone Encounter (Signed)
Med filled.  

## 2014-07-26 NOTE — Telephone Encounter (Signed)
Caller name: Lurene ShadowFera, Eula Relation to pt: self  Call back number: [phone] Pharmacy:[facility] [phone]  Reason for call:  Pt states insurance will not cover  (ABILIFY) 5 MG tablet but will cover ARIPiprazole

## 2014-08-30 ENCOUNTER — Telehealth: Payer: Self-pay | Admitting: Family Medicine

## 2014-08-30 MED ORDER — ARIPIPRAZOLE 5 MG PO TABS: 5.0000 mg | ORAL_TABLET | Freq: Every day | ORAL | Status: DC

## 2014-08-30 NOTE — Telephone Encounter (Signed)
Med resent for generic.  

## 2014-08-30 NOTE — Telephone Encounter (Signed)
Caller name:vita Relation to ZO:XWRUpt:none Call back number:[phone] [facility]   Reason for call: [facility] clarification on pt's rx  ARIPiprazole (ABILIFY) 5 MG tablet  Insurance no longer covers brand only generic only need to know what to do

## 2014-08-31 ENCOUNTER — Telehealth: Payer: Self-pay | Admitting: *Deleted

## 2014-08-31 NOTE — Telephone Encounter (Signed)
Prior authorization for Abilify initiated. Awaiting determination. JG//CMA

## 2014-09-04 NOTE — Telephone Encounter (Signed)
Please call pt to notify her that insurance denied her medication and see if she has been on either medication in the past (if she has, she may have a preference).

## 2014-09-04 NOTE — Telephone Encounter (Signed)
Patient stated that her insurance company will cover the generic for Abilify and Shanda BumpsJessica T e-scribed last week.

## 2014-09-04 NOTE — Telephone Encounter (Signed)
Prior authorization for Abilify denied. Alternatives include Latuda and Seroquel. Please advise. JG//CMA

## 2014-09-26 ENCOUNTER — Telehealth: Payer: Self-pay | Admitting: Family Medicine

## 2014-09-26 NOTE — Telephone Encounter (Signed)
This would be an office visit

## 2014-09-26 NOTE — Telephone Encounter (Signed)
Caller name: Lurene ShadowFera, Vernal Relation to pt: self  Call back number: [phone] Pharmacy:  Reason for call:  Pt would like to discuss diet plan please advise

## 2014-09-27 NOTE — Telephone Encounter (Signed)
Appointment scheduled for 10/04/2014

## 2014-10-04 ENCOUNTER — Ambulatory Visit: Payer: Self-pay | Admitting: Family Medicine

## 2014-10-05 ENCOUNTER — Ambulatory Visit: Payer: Self-pay | Admitting: Family Medicine

## 2014-10-06 ENCOUNTER — Other Ambulatory Visit: Payer: Self-pay | Admitting: General Practice

## 2014-10-06 MED ORDER — MELOXICAM 15 MG PO TABS: ORAL_TABLET | ORAL | Status: DC

## 2014-10-10 LAB — HM PAP SMEAR: HM Pap smear: NORMAL

## 2014-10-12 ENCOUNTER — Encounter: Payer: Self-pay | Admitting: General Practice

## 2014-11-21 ENCOUNTER — Other Ambulatory Visit: Payer: Self-pay | Admitting: Family Medicine

## 2014-11-21 NOTE — Telephone Encounter (Signed)
Medication filled.  

## 2014-11-23 ENCOUNTER — Telehealth: Payer: Self-pay | Admitting: Family Medicine

## 2014-11-23 MED ORDER — LEVOTHYROXINE SODIUM 150 MCG PO TABS: ORAL_TABLET | ORAL | Status: DC

## 2014-11-23 NOTE — Telephone Encounter (Signed)
Caller name:Corado Vance GatherShelley Relation to WU:JWJXpt:self Call back number:[phone] Pharmacy:[facility]  Reason for call: pt has appt scheduled for a CPE on 4/22, however she will be out of her meds. Pt needs rx levothyroxine (SYNTHROID, LEVOTHROID) 150 MCG tablet, would like to know if dr. Beverely Lowtabori can call her in enough to last until the appt.

## 2014-11-23 NOTE — Telephone Encounter (Signed)
Med filled.  

## 2014-11-24 ENCOUNTER — Telehealth: Payer: Self-pay | Admitting: Family Medicine

## 2014-11-24 NOTE — Telephone Encounter (Signed)
Pre visit letter sent  °

## 2014-11-27 ENCOUNTER — Telehealth: Payer: Self-pay | Admitting: Family Medicine

## 2014-11-27 MED ORDER — SERTRALINE HCL 50 MG PO TABS: 50.0000 mg | ORAL_TABLET | Freq: Every day | ORAL | Status: DC

## 2014-11-27 NOTE — Telephone Encounter (Signed)
Ok to fill this medication?  

## 2014-11-27 NOTE — Telephone Encounter (Signed)
Med filled.  

## 2014-11-27 NOTE — Telephone Encounter (Signed)
Ok to refill zoloft x6

## 2014-11-27 NOTE — Telephone Encounter (Signed)
Caller name: Lurene ShadowFera, L[place]anna Relation to pt: self  Call back number: [phone] [facility]acy: [id number] [place] [address] [phone] (Phone) [phone])          Reason for call:  Pt requesting a refill sertraline (ZOLOFT) 100 MG tablet due to reumtologist not treating fibromyalgia anymore. Please advise

## 2014-11-28 ENCOUNTER — Other Ambulatory Visit: Payer: Self-pay | Admitting: General Practice

## 2014-11-28 MED ORDER — SERTRALINE HCL 100 MG PO TABS: 100.0000 mg | ORAL_TABLET | Freq: Every day | ORAL | Status: DC

## 2014-12-13 ENCOUNTER — Telehealth: Payer: Self-pay

## 2014-12-13 NOTE — Telephone Encounter (Signed)
Pre visit call completed 

## 2014-12-15 ENCOUNTER — Other Ambulatory Visit (INDEPENDENT_AMBULATORY_CARE_PROVIDER_SITE_OTHER): Payer: 59

## 2014-12-15 ENCOUNTER — Encounter: Payer: Self-pay | Admitting: Family Medicine

## 2014-12-15 ENCOUNTER — Telehealth: Payer: Self-pay | Admitting: Family Medicine

## 2014-12-15 ENCOUNTER — Ambulatory Visit (INDEPENDENT_AMBULATORY_CARE_PROVIDER_SITE_OTHER): Payer: 59 | Admitting: Family Medicine

## 2014-12-15 VITALS — BP 120/80 | HR 82 | Temp 98.3°F | Resp 16 | Ht 65.5 in | Wt 155.1 lb

## 2014-12-15 DIAGNOSIS — R739 Hyperglycemia, unspecified: Secondary | ICD-10-CM

## 2014-12-15 DIAGNOSIS — Z0000 Encounter for general adult medical examination without abnormal findings: Secondary | ICD-10-CM

## 2014-12-15 LAB — BASIC METABOLIC PANEL
BUN: 11 mg/dL (ref 6–23)
CO2: 30 mEq/L (ref 19–32)
Calcium: 9.5 mg/dL (ref 8.4–10.5)
Chloride: 104 mEq/L (ref 96–112)
Creatinine, Ser: 0.83 mg/dL (ref 0.40–1.20)
GFR: 81.09 mL/min (ref >60.00)
Glucose, Bld: 114 mg/dL — ABNORMAL HIGH (ref 70–99)
Potassium: 4.4 mEq/L (ref 3.5–5.1)
Sodium: 139 mEq/L (ref 135–145)

## 2014-12-15 LAB — CBC WITH DIFFERENTIAL/PLATELET
Basophils Absolute: 0 10*3/uL (ref 0.0–0.1)
Basophils Relative: 0.7 % (ref 0.0–3.0)
Eosinophils Absolute: 0.3 10*3/uL (ref 0.0–0.7)
Eosinophils Relative: 5.6 % — ABNORMAL HIGH (ref 0.0–5.0)
HCT: 40.4 % (ref 36.0–46.0)
Hemoglobin: 13.7 g/dL (ref 12.0–15.0)
Lymphocytes Relative: 17.8 % (ref 12.0–46.0)
Lymphs Abs: 1 10*3/uL (ref 0.7–4.0)
MCHC: 33.9 g/dL (ref 30.0–36.0)
MCV: 90.2 fl (ref 78.0–100.0)
Monocytes Absolute: 0.4 10*3/uL (ref 0.1–1.0)
Monocytes Relative: 6.8 % (ref 3.0–12.0)
Neutro Abs: 4 10*3/uL (ref 1.4–7.7)
Neutrophils Relative %: 69.1 % (ref 43.0–77.0)
Platelets: 249 10*3/uL (ref 150.0–400.0)
RBC: 4.48 Mil/uL (ref 3.87–5.11)
RDW: 15.6 % — ABNORMAL HIGH (ref 11.5–15.5)
WBC: 5.8 10*3/uL (ref 4.0–10.5)

## 2014-12-15 LAB — HEPATIC FUNCTION PANEL
ALT: 15 U/L (ref 0–35)
AST: 21 U/L (ref 0–37)
Albumin: 4.3 g/dL (ref 3.5–5.2)
Alkaline Phosphatase: 63 U/L (ref 39–117)
Bilirubin, Direct: 0.1 mg/dL (ref 0.0–0.3)
Total Bilirubin: 0.4 mg/dL (ref 0.2–1.2)
Total Protein: 7.2 g/dL (ref 6.0–8.3)

## 2014-12-15 LAB — LIPID PANEL
Cholesterol: 162 mg/dL (ref 0–200)
HDL: 42.3 mg/dL (ref >39.00)
LDL Cholesterol: 104 mg/dL — ABNORMAL HIGH (ref 0–99)
NonHDL: 119.7
Total CHOL/HDL Ratio: 4
Triglycerides: 81 mg/dL (ref 0.0–149.0)
VLDL: 16.2 mg/dL (ref 0.0–40.0)

## 2014-12-15 LAB — VITAMIN D 25 HYDROXY (VIT D DEFICIENCY, FRACTURES): VITD: 36.9 ng/mL (ref 30.00–100.00)

## 2014-12-15 LAB — HEMOGLOBIN A1C: Hgb A1c MFr Bld: 5.3 % (ref 4.6–6.5)

## 2014-12-15 LAB — TSH: TSH: 2.9 u[IU]/mL (ref 0.35–4.50)

## 2014-12-15 MED ORDER — ARIPIPRAZOLE 5 MG PO TABS: 5.0000 mg | ORAL_TABLET | Freq: Every day | ORAL | Status: DC

## 2014-12-15 NOTE — Progress Notes (Signed)
Pre visit review using our clinic review tool, if applicable. No additional management support is needed unless otherwise documented below in the visit note. 

## 2014-12-15 NOTE — Telephone Encounter (Signed)
Generic resent

## 2014-12-15 NOTE — Patient Instructions (Signed)
Follow up in 6 months to recheck fibro We'll notify you of your lab results and make any changes if needed Keep up the good work on healthy diet- you look great! Attempt to add regular exercise Call with any questions or concerns Happy Spring!!!

## 2014-12-15 NOTE — Telephone Encounter (Signed)
Caller name: brian  Relation to pt: Call back number: [phone] [facility] [place] on [address]  Reason for call:   States that patient insurance will not cover brand of abilify but will cover generic

## 2014-12-15 NOTE — Progress Notes (Signed)
   Subjective:    Patient ID: [name], female    DOB: [birth year], 39 y.o.   MRN: [id number]  HPI CPE- UTD on GYN (Dr Claiborne BillingsallahanNestor Ramp- [place])  Pt has lost 25+ lbs.   Review of Systems Patient reports no vision/ hearing changes, adenopathy,fever, weight change,  persistant/recurrent hoarseness , swallowing issues, chest pain, palpitations, edema, persistant/recurrent cough, hemoptysis, dyspnea (rest/exertional/paroxysmal nocturnal), gastrointestinal bleeding (melena, rectal bleeding), abdominal pain, significant heartburn, bowel changes, GU symptoms (dysuria, hematuria, incontinence), Gyn symptoms (abnormal  bleeding, pain),  syncope, focal weakness, memory loss, numbness & tingling, skin/hair/nail changes, abnormal bruising or bleeding, anxiety, or depression.     Objective:   Physical Exam General Appearance:    Alert, cooperative, no distress, appears stated age  Head:    Normocephalic, without obvious abnormality, atraumatic  Eyes:    PERRL, conjunctiva/corneas clear, EOM's intact, fundi    benign, both eyes  Ears:    Normal TM's and external ear canals, both ears  Nose:   Nares normal, septum midline, mucosa normal, no drainage    or sinus tenderness  Throat:   Lips, mucosa, and tongue normal; teeth and gums normal  Neck:   Supple, symmetrical, trachea midline, no adenopathy;    Thyroid: no enlargement/tenderness/nodules  Back:     Symmetric, no curvature, ROM normal, no CVA tenderness  Lungs:     Clear to auscultation bilaterally, respirations unlabored  Chest Wall:    No tenderness or deformity   Heart:    Regular rate and rhythm, S1 and S2 normal, no murmur, rub   or gallop  Breast Exam:    Deferred to GYN  Abdomen:     Soft, non-tender, bowel sounds active all four quadrants,    no masses, no organomegaly  Genitalia:    Deferred to GYN  Rectal:    Extremities:   Extremities normal, atraumatic, no cyanosis or edema  Pulses:   2+ and symmetric all extremities  Skin:   Skin  color, texture, turgor normal, no rashes or lesions  Lymph nodes:   Cervical, supraclavicular, and axillary nodes normal  Neurologic:   CNII-XII intact, normal strength, sensation and reflexes    throughout          Assessment & Plan:

## 2014-12-15 NOTE — Assessment & Plan Note (Signed)
Pt's PE WNL.  UTD on GYN.  Check labs.  Anticipatory guidance provided.  

## 2014-12-19 ENCOUNTER — Telehealth: Payer: Self-pay | Admitting: Family Medicine

## 2014-12-19 NOTE — Telephone Encounter (Signed)
Called pt and notified. Pt stated an understanding.

## 2014-12-19 NOTE — Telephone Encounter (Signed)
Caller name: Jadwiga Relation to pt: self Call back number: [phone] Pharmacy:  Reason for call:    She forgot to ask at time of physical. She states that she is having a lot of hair loss and wants to know what Dr. Beverely Lowabori recommend she do

## 2014-12-19 NOTE — Telephone Encounter (Signed)
Daily multivitamin in addition to OTC Biotin

## 2015-01-05 ENCOUNTER — Ambulatory Visit (INDEPENDENT_AMBULATORY_CARE_PROVIDER_SITE_OTHER): Payer: 59 | Admitting: Medical

## 2015-01-05 ENCOUNTER — Encounter: Payer: Self-pay | Admitting: Medical

## 2015-01-05 VITALS — BP 109/74 | HR 98 | Temp 99.1°F | Ht 65.5 in | Wt 148.0 lb

## 2015-01-05 DIAGNOSIS — J0100 Acute maxillary sinusitis, unspecified: Secondary | ICD-10-CM | POA: Diagnosis not present

## 2015-01-05 DIAGNOSIS — J309 Allergic rhinitis, unspecified: Secondary | ICD-10-CM | POA: Insufficient documentation

## 2015-01-05 DIAGNOSIS — J301 Allergic rhinitis due to pollen: Secondary | ICD-10-CM | POA: Diagnosis not present

## 2015-01-05 MED ORDER — CEFDINIR 300 MG PO CAPS: 300.0000 mg | ORAL_CAPSULE | Freq: Two times a day (BID) | ORAL | Status: DC

## 2015-01-05 MED ORDER — MOMETASONE FUROATE 50 MCG/ACT NA SUSP: NASAL | Status: DC

## 2015-01-05 NOTE — Progress Notes (Signed)
Pre visit review using our clinic review tool, if applicable. No additional management support is needed unless otherwise documented below in the visit note. 

## 2015-01-05 NOTE — Assessment & Plan Note (Addendum)
Rx cefdnir antibiotic.  Not ear pain rt side may be early OM vs eustachian tube dysfunction.

## 2015-01-05 NOTE — Progress Notes (Signed)
Subjective:    Patient ID: [name], female    DOB: Oct 23, 1975, 39 y.o.   MRN: [id number]  HPI  Pt in with some rt side ear pain. Pain was severe before lunch. On and off pain for couple of weeks. Past 2 morning some ha and she has been congested since Saturday. Pt has had watery eyes and runny nose. Some pnd. Pt has not been taking any med otc.  LMP- December 08, 2014.     Review of Systems  Constitutional: Negative for fever, chills and fatigue.  HENT: Positive for congestion, postnasal drip, rhinorrhea, sinus pressure and sneezing.   Respiratory: Negative for cough, choking, chest tightness, shortness of breath and wheezing.   Cardiovascular: Negative for chest pain and palpitations.  Gastrointestinal: Negative.   Musculoskeletal: Negative for back pain.  Neurological: Positive for headaches. Negative for dizziness, tremors, seizures, syncope, facial asymmetry, speech difficulty, weakness, light-headedness and numbness.       Moderate ha this am. But now resolved.  Hematological: Negative for adenopathy. Does not bruise/bleed easily.     Past Medical History  Diagnosis Date  . Fibromyalgia   . Hypothyroidism 1999    post PTU Rx for hyperthyroidism  . IBS (irritable bowel syndrome)   . Depression   . Anxiety   . Arthritis     History   Social History  . Marital Status: Married    Spouse Name: N/A  . Number of Children: N/A  . Years of Education: N/A   Occupational History  . insurance agent    Social History Main Topics  . Smoking status: Never Smoker   . Smokeless tobacco: Not on file  . Alcohol Use: Yes  . Drug Use: No  . Sexual Activity: Not on file   Other Topics Concern  . Not on file   Social History Narrative    Past Surgical History  Procedure Laterality Date  . Cesarean section      x 2    Family History  Problem Relation Age of Onset  . Diabetes Paternal Grandmother   . ADD / ADHD Son   . Hyperlipidemia Mother   . Diabetes Father     . Cancer Paternal Grandfather     lung    Allergies  Allergen Reactions  . Methimazole Other (See Comments)    Body goes into arthritic shock    Current Outpatient Prescriptions on File Prior to Visit  Medication Sig Dispense Refill  . ARIPiprazole (ABILIFY) 5 MG tablet Take 1 tablet (5 mg total) by mouth daily. 30 tablet 3  . levothyroxine (SYNTHROID, LEVOTHROID) 150 MCG tablet Take one tablet by mouth one time daily. 30 tablet 0  . meloxicam (MOBIC) 15 MG tablet TAKE 1/2 TO 1 TABLET BY MOUTH ONCE DAILY AS NEEDED FOR PAIN 30 tablet 0  . sertraline (ZOLOFT) 100 MG tablet Take 1 tablet (100 mg total) by mouth daily. 30 tablet 3   No current facility-administered medications on file prior to visit.    BP 109/74 mmHg  Pulse 98  Temp(Src) 99.1 F (37.3 C) (Oral)  Ht 5' 5.5" (1.664 m)  Wt 148 lb (67.132 kg)  BMI 24.25 kg/m2  SpO2 100%  LMP 12/08/2014       Objective:   Physical Exam  General  Mental Status - Alert. General Appearance - Well groomed. Not in acute distress.  Skin Rashes- No Rashes.  HEENT Head- Normal. Ear Auditory Canal - Left- Normal. Right - Normal.Tympanic Membrane- Left- Normal. Right-  mild dull. Eye Sclera/Conjunctiva- Left- Normal. Right- Normal. Nose & Sinuses Nasal Mucosa- Left-  Boggy and Congested. Right-  Boggy and  Congested.Bilateral maxillary sinus pressure but no frontal sinus pressure. Mouth & Throat Lips: Upper Lip- Normal: no dryness, cracking, pallor, cyanosis, or vesicular eruption. Lower Lip-Normal: no dryness, cracking, pallor, cyanosis or vesicular eruption. Buccal Mucosa- Bilateral- No Aphthous ulcers. Oropharynx- No Discharge or Erythema. +pnd. Tonsils: Characteristics- Bilateral- No Erythema or Congestion. Size/Enlargement- Bilateral- No enlargement. Discharge- bilateral-None.  Neck Neck- Supple. No Masses.   Chest and Lung Exam Auscultation: Breath Sounds:-Clear even and  unlabored.  Cardiovascular Auscultation:Rythm- Regular, rate and rhythm. Murmurs & Other Heart Sounds:Ausculatation of the heart reveal- No Murmurs.  Lymphatic Head & Neck General Head & Neck Lymphatics: Bilateral: Description- No Localized lymphadenopathy.       Assessment & Plan:

## 2015-01-05 NOTE — Assessment & Plan Note (Signed)
rx nasonex nasal spray. If not covered then get flonase otc. Do get claritin otc.

## 2015-01-05 NOTE — Patient Instructions (Addendum)
Allergic rhinitis rx nasonex nasal spray. If not covered then get flonase otc. Do get claritin otc.   Acute sinusitis Rx cefdnir antibiotic.  Not ear pain rt side may be early OM vs eustachian tube dysfunction.    Follow up in 7 days or as needed

## 2015-01-31 ENCOUNTER — Ambulatory Visit (INDEPENDENT_AMBULATORY_CARE_PROVIDER_SITE_OTHER): Payer: 59 | Admitting: Family Medicine

## 2015-01-31 ENCOUNTER — Encounter: Payer: Self-pay | Admitting: Family Medicine

## 2015-01-31 VITALS — BP 106/78 | HR 98 | Temp 98.5°F | Resp 16 | Wt 152.5 lb

## 2015-01-31 DIAGNOSIS — L709 Acne, unspecified: Secondary | ICD-10-CM | POA: Insufficient documentation

## 2015-01-31 DIAGNOSIS — L659 Nonscarring hair loss, unspecified: Secondary | ICD-10-CM | POA: Diagnosis not present

## 2015-01-31 DIAGNOSIS — L700 Acne vulgaris: Secondary | ICD-10-CM

## 2015-01-31 LAB — CORTISOL: Cortisol, Plasma: 11.7 ug/dL

## 2015-01-31 LAB — TSH: TSH: 0.46 u[IU]/mL (ref 0.35–4.50)

## 2015-01-31 NOTE — Patient Instructions (Signed)
We'll notify you of your lab results and make any changes if needed We are starting the investigation of PCOS (polycystic ovarian syndrome) If no obvious cause on lab results, we'll refer to Endo Call with any questions or concerns Hang in there!!!

## 2015-01-31 NOTE — Progress Notes (Signed)
   Subjective:    Patient ID: [name], female    DOB: [birth year], 39 y.o.   MRN: [id number]  HPI Hair loss- 'really bad'.  'it makes me want to cry'.  Hair dresser told her it was 'significantly thinner'.  No reports of bald spots.  Will clean drain daily after shower and pt reports a mound of hair daily.  sxs started 4-6 weeks ago.  No menstrual changes.  Pt has lost considerable weight recently.  Acne- facial acne.  sxs coincide w/ hair loss (~6 weeks ago).  + facial hair- waxes regularly.   Review of Systems For ROS see HPI     Objective:   Physical Exam  Constitutional: She is oriented to person, place, and time. She appears well-developed and well-nourished. No distress.  HENT:  Head: Normocephalic and atraumatic.  Thinning hair  Neck: Normal range of motion. Neck supple. No thyromegaly present.  Cardiovascular: Normal rate, regular rhythm, normal heart sounds and intact distal pulses.   Pulmonary/Chest: Effort normal and breath sounds normal. No respiratory distress. She has no wheezes. She has no rales.  Neurological: She is alert and oriented to person, place, and time.  Skin: Skin is warm and dry.  Facial acne  Psychiatric: She has a normal mood and affect. Her behavior is normal. Thought content normal.  Vitals reviewed.         Assessment & Plan:

## 2015-01-31 NOTE — Assessment & Plan Note (Signed)
New.  Increased acne coupled w/ increased facial hair growth and recent hair loss is concerning for possible androgen excess or PCOS.  Will check preliminary endo labs to assess.  If no obvious cause on labs will refer to Endo for complete work up.  Pt expressed understanding and is in agreement w/ plan.

## 2015-01-31 NOTE — Progress Notes (Signed)
Pre visit review using our clinic review tool, if applicable. No additional management support is needed unless otherwise documented below in the visit note. 

## 2015-01-31 NOTE — Assessment & Plan Note (Signed)
New.  Pt reports excessive hair loss x6 weeks.  No change in medications.  Husband and hair dresser have both commented.  Recheck TSH and adjust meds prn.  If normal, will likely need endo workup due to constellation of other symptoms (facial hair, acne)

## 2015-02-01 ENCOUNTER — Other Ambulatory Visit: Payer: Self-pay | Admitting: Family Medicine

## 2015-02-01 ENCOUNTER — Other Ambulatory Visit: Payer: Self-pay | Admitting: Medical

## 2015-02-01 DIAGNOSIS — L708 Other acne: Secondary | ICD-10-CM

## 2015-02-01 DIAGNOSIS — L659 Nonscarring hair loss, unspecified: Secondary | ICD-10-CM

## 2015-02-01 LAB — PROLACTIN: Prolactin: 1.6 ng/mL

## 2015-02-01 LAB — DHEA-SULFATE: DHEA-SO4: 128 ug/dL (ref 23–266)

## 2015-02-16 ENCOUNTER — Other Ambulatory Visit: Payer: Self-pay | Admitting: General Practice

## 2015-02-16 MED ORDER — LEVOTHYROXINE SODIUM 150 MCG PO TABS: ORAL_TABLET | ORAL | Status: DC

## 2015-03-02 ENCOUNTER — Ambulatory Visit (INDEPENDENT_AMBULATORY_CARE_PROVIDER_SITE_OTHER): Payer: 59 | Admitting: Endocrinology

## 2015-03-02 ENCOUNTER — Encounter: Payer: Self-pay | Admitting: Endocrinology

## 2015-03-02 VITALS — BP 112/62 | HR 88 | Temp 98.2°F | Ht 65.0 in | Wt 151.0 lb

## 2015-03-02 DIAGNOSIS — E282 Polycystic ovarian syndrome: Secondary | ICD-10-CM | POA: Diagnosis not present

## 2015-03-02 LAB — TESTOSTERONE: Testosterone: 30.72 ng/dL (ref 15.00–40.00)

## 2015-03-02 NOTE — Patient Instructions (Addendum)
blood tests are requested for you today.  We'll let you know about the results. Please continue your dietary effort--weight loss is great treatment for PCO. You have an increased risk of diabetes.  Metformin also reduces your risk.  Aside from birth control pills, medications such as spironolactone and "Vaniqa" cream help the hair growth.        Polycystic Ovarian Syndrome Polycystic ovarian syndrome (PCOS) is a common hormonal disorder among women of reproductive age. Most women with PCOS grow many small cysts on their ovaries. PCOS can cause problems with your periods and make it difficult to get pregnant. It can also cause an increased risk of miscarriage with pregnancy. If left untreated, PCOS can lead to serious health problems, such as diabetes and heart disease. CAUSES The cause of PCOS is not fully understood, but genetics may be a factor. SIGNS AND SYMPTOMS   Infrequent or no menstrual periods.   Inability to get pregnant (infertility) because of not ovulating.   Increased growth of hair on the face, chest, stomach, back, thumbs, thighs, or toes.   Acne, oily skin, or dandruff.   Pelvic pain.   Weight gain or obesity, usually carrying extra weight around the waist.   Type 2 diabetes.   High cholesterol.   High blood pressure.   Female-pattern baldness or thinning hair.   Patches of thickened and dark brown or black skin on the neck, arms, breasts, or thighs.   Tiny excess flaps of skin (skin tags) in the armpits or neck area.   Excessive snoring and having breathing stop at times while asleep (sleep apnea).   Deepening of the voice.   Gestational diabetes when pregnant.  DIAGNOSIS  There is no single test to diagnose PCOS.   Your health care provider will:   Take a medical history.   Perform a pelvic exam.   Have ultrasonography done.   Check your female and female hormone levels.   Measure glucose or sugar levels in the blood.    Do other blood tests.   If you are producing too many female hormones, your health care provider will make sure it is from PCOS. At the physical exam, your health care provider will want to evaluate the areas of increased hair growth. Try to allow natural hair growth for a few days before the visit.   During a pelvic exam, the ovaries may be enlarged or swollen because of the increased number of small cysts. This can be seen more easily by using vaginal ultrasonography or screening to examine the ovaries and lining of the uterus (endometrium) for cysts. The uterine lining may become thicker if you have not been having a regular period.  TREATMENT  Because there is no cure for PCOS, it needs to be managed to prevent problems. Treatments are based on your symptoms. Treatment is also based on whether you want to have a baby or whether you need contraception.  Treatment may include:   Progesterone hormone to start a menstrual period.   Birth control pills to make you have regular menstrual periods.   Medicines to make you ovulate, if you want to get pregnant.   Medicines to control your insulin.   Medicine to control your blood pressure.   Medicine and diet to control your high cholesterol and triglycerides in your blood.  Medicine to reduce excessive hair growth.  Surgery, making small holes in the ovary, to decrease the amount of female hormone production. This is done through a long, lighted  tube (laparoscope) placed into the pelvis through a tiny incision in the lower abdomen.  HOME CARE INSTRUCTIONS  Only take over-the-counter or prescription medicine as directed by your health care provider.  Pay attention to the foods you eat and your activity levels. This can help reduce the effects of PCOS.  Keep your weight under control.  Eat foods that are low in carbohydrate and high in fiber.  Exercise regularly. SEEK MEDICAL CARE IF:  Your symptoms do not get better with  medicine.  You have new symptoms. Document Released: 12/05/2004 Document Revised: 06/01/2013 Document Reviewed: 01/27/2013 [facility] Patient Information 2015 [place], [place]. This information is not intended to replace advice given to you by your health care provider. Make sure you discuss any questions you have with your health care provider.

## 2015-03-02 NOTE — Progress Notes (Signed)
Subjective:    Patient ID: [name], female    DOB: [birth year], 39 y.o.   MRN: [id number]  HPI Pt had menarche at age 50.  She has always had slightly irregular menses. She is G2P2.  She reports 10 years of slight hair on the face, and assoc hair loss from the head.  She does not want any more pregnancies.  She took oral contraceptives x 2 years, approx 15 years ago (she did not tolerate--weight gain, emotional lability, and weight gain).  Husband has had vasectomy.   Past Medical History  Diagnosis Date  . Fibromyalgia   . Hypothyroidism 1999    post PTU Rx for hyperthyroidism  . IBS (irritable bowel syndrome)   . Depression   . Anxiety   . Arthritis     Past Surgical History  Procedure Laterality Date  . Cesarean section      x 2    History   Social History  . Marital Status: Married    Spouse Name: N/A  . Number of Children: N/A  . Years of Education: N/A   Occupational History  . insurance agent    Social History Main Topics  . Smoking status: Never Smoker   . Smokeless tobacco: Not on file  . Alcohol Use: Yes  . Drug Use: No  . Sexual Activity: Not on file   Other Topics Concern  . Not on file   Social History Narrative    Current Outpatient Prescriptions on File Prior to Visit  Medication Sig Dispense Refill  . ARIPiprazole (ABILIFY) 5 MG tablet Take 1 tablet (5 mg total) by mouth daily. 30 tablet 3  . levothyroxine (SYNTHROID, LEVOTHROID) 150 MCG tablet Take one tablet by mouth one time daily. 30 tablet 6  . meloxicam (MOBIC) 15 MG tablet TAKE 1/2 TO 1 TABLET BY MOUTH ONCE DAILY AS NEEDED FOR PAIN 30 tablet 0  . sertraline (ZOLOFT) 100 MG tablet Take 1 tablet (100 mg total) by mouth daily. 30 tablet 3  . mometasone (NASONEX) 50 MCG/ACT nasal spray PLACE TWO SPRAYS INTO EACH NOSTRIL DAILY (Patient not taking: Reported on 03/02/2015) 17 g 0   No current facility-administered medications on file prior to visit.    Allergies  Allergen Reactions  .  Methimazole Other (See Comments)    Body goes into arthritic shock    Family History  Problem Relation Age of Onset  . Diabetes Paternal Grandmother   . ADD / ADHD Son   . Hyperlipidemia Mother   . Diabetes Father   . Cancer Paternal Grandfather     lung  . Other Neg Hx     PCO    BP 112/62 mmHg  Pulse 88  Temp(Src) 98.2 F (36.8 C) (Oral)  Ht  (1.651 m)  Wt 151 lb (68.493 kg)  BMI 25.13 kg/m2  SpO2 96%   Review of Systems denies excessive diaphoresis, polyuria, sob, hyperpigmentation, and rash on the abdomen.  She has weight loss, due to her efforts.  She has daily headache, myalgias, acral numbness, easy bruising, and frequent insomnia.    Objective:   Physical Exam VS: see vs page GEN: no distress HEAD: head: no deformity eyes: no periorbital swelling, no proptosis external nose and ears are normal mouth: no lesion seen NECK: supple, thyroid is not enlarged CHEST WALL: no deformity LUNGS:  Clear to auscultation CV: reg rate and rhythm, no murmur ABD: abdomen is soft, nontender.  no hepatosplenomegaly.  not distended.  no hernia  MUSCULOSKELETAL: muscle bulk and strength are grossly normal.  no obvious joint swelling.  gait is normal and steady.   EXTEMITIES: no deformity.  no ulcer on the feet.  feet are of normal color and temp.  no edema.   PULSES: dorsalis pedis intact bilat.  no carotid bruit.   NEURO:  cn 2-12 grossly intact.   readily moves all 4's.  sensation is intact to touch on the feet SKIN:  Normal texture and temperature.  No rash or suspicious lesion is visible.  No striae on the abdomen.  No terminal hair on the face (she removes it).   NODES:  None palpable at the neck.  PSYCH: alert, well-oriented.  Does not appear anxious nor depressed.     Lab Results  Component Value Date   TSH 0.46 01/31/2015   Lab Results  Component Value Date   HGBA1C 5.3 12/15/2014   US (2008): no ovarian cysts are noted.  Lab Results  Component Value Date     TESTOSTERONE 30.72 03/02/2015      Assessment & Plan:  Hirsutism: does not meet strict criteria for PCO, but should be approached as though she does. Weight loss.  This helps.  Pt is advised to continue her efforts.      Patient is advised the following: Patient Instructions  blood tests are requested for you today.  We'll let you know about the results. Please continue your dietary effort--weight loss is great treatment for PCO. You have an increased risk of diabetes.  Metformin also reduces your risk.  Aside from birth control pills, medications such as spironolactone and "Vaniqa" cream help the hair growth.        Polycystic Ovarian Syndrome Polycystic ovarian syndrome (PCOS) is a common hormonal disorder among women of reproductive age. Most women with PCOS grow many small cysts on their ovaries. PCOS can cause problems with your periods and make it difficult to get pregnant. It can also cause an increased risk of miscarriage with pregnancy. If left untreated, PCOS can lead to serious health problems, such as diabetes and heart disease. CAUSES The cause of PCOS is not fully understood, but genetics may be a factor. SIGNS AND SYMPTOMS   Infrequent or no menstrual periods.   Inability to get pregnant (infertility) because of not ovulating.   Increased growth of hair on the face, chest, stomach, back, thumbs, thighs, or toes.   Acne, oily skin, or dandruff.   Pelvic pain.   Weight gain or obesity, usually carrying extra weight around the waist.   Type 2 diabetes.   High cholesterol.   High blood pressure.   Female-pattern baldness or thinning hair.   Patches of thickened and dark brown or black skin on the neck, arms, breasts, or thighs.   Tiny excess flaps of skin (skin tags) in the armpits or neck area.   Excessive snoring and having breathing stop at times while asleep (sleep apnea).   Deepening of the voice.   Gestational diabetes when  pregnant.  DIAGNOSIS  There is no single test to diagnose PCOS.   Your health care provider will:   Take a medical history.   Perform a pelvic exam.   Have ultrasonography done.   Check your female and female hormone levels.   Measure glucose or sugar levels in the blood.   Do other blood tests.   If you are producing too many female hormones, your health care provider will make sure it is from PCOS. At the physical  exam, your health care provider will want to evaluate the areas of increased hair growth. Try to allow natural hair growth for a few days before the visit.   During a pelvic exam, the ovaries may be enlarged or swollen because of the increased number of small cysts. This can be seen more easily by using vaginal ultrasonography or screening to examine the ovaries and lining of the uterus (endometrium) for cysts. The uterine lining may become thicker if you have not been having a regular period.  TREATMENT  Because there is no cure for PCOS, it needs to be managed to prevent problems. Treatments are based on your symptoms. Treatment is also based on whether you want to have a baby or whether you need contraception.  Treatment may include:   Progesterone hormone to start a menstrual period.   Birth control pills to make you have regular menstrual periods.   Medicines to make you ovulate, if you want to get pregnant.   Medicines to control your insulin.   Medicine to control your blood pressure.   Medicine and diet to control your high cholesterol and triglycerides in your blood.  Medicine to reduce excessive hair growth.  Surgery, making small holes in the ovary, to decrease the amount of female hormone production. This is done through a long, lighted tube (laparoscope) placed into the pelvis through a tiny incision in the lower abdomen.  HOME CARE INSTRUCTIONS  Only take over-the-counter or prescription medicine as directed by your health care  provider.  Pay attention to the foods you eat and your activity levels. This can help reduce the effects of PCOS.  Keep your weight under control.  Eat foods that are low in carbohydrate and high in fiber.  Exercise regularly. SEEK MEDICAL CARE IF:  Your symptoms do not get better with medicine.  You have new symptoms. Document Released: 12/05/2004 Document Revised: 06/01/2013 Document Reviewed: 01/27/2013 [facility] Patient Information 2015 [place], [place]. This information is not intended to replace advice given to you by your health care provider. Make sure you discuss any questions you have with your health care provider.

## 2015-03-06 ENCOUNTER — Other Ambulatory Visit: Payer: Self-pay | Admitting: Family Medicine

## 2015-03-06 ENCOUNTER — Other Ambulatory Visit: Payer: Self-pay | Admitting: Endocrinology

## 2015-03-06 MED ORDER — METFORMIN HCL ER 500 MG PO TB24: 500.0000 mg | ORAL_TABLET | Freq: Every day | ORAL | Status: DC

## 2015-03-07 NOTE — Telephone Encounter (Signed)
Med filled.  

## 2015-03-23 ENCOUNTER — Telehealth: Payer: Self-pay | Admitting: Endocrinology

## 2015-03-23 NOTE — Telephone Encounter (Signed)
Pt called in requesting that a RX for the birth control that was discussed as the next step in action for her care plan be sent to her pharmacy.Pt is also wondering if she should check her blood sugar daily although noted previously by you that this is not necessary.Please advise

## 2015-03-23 NOTE — Telephone Encounter (Signed)
Patient called stating was supposed to follow up in 2 weeks rx  Rx: spirolactole

## 2015-03-23 NOTE — Telephone Encounter (Signed)
Previous message mentioned spironolactone.  It is fine with me to take both, but please start with just one.  That way, if you have a side-effect, we know which med it was.  Please let me know.

## 2015-03-26 ENCOUNTER — Other Ambulatory Visit: Payer: Self-pay

## 2015-03-26 ENCOUNTER — Telehealth: Payer: Self-pay

## 2015-03-26 MED ORDER — SPIRONOLACTONE 50 MG PO TABS: 50.0000 mg | ORAL_TABLET | Freq: Every day | ORAL | Status: DC

## 2015-03-26 NOTE — Addendum Note (Signed)
Addended by: Romero Belling on: 03/26/2015 03:18 PM   Modules accepted: Orders

## 2015-03-26 NOTE — Telephone Encounter (Signed)
Ok, i have sent a prescription to your pharmacy. Please note this may increase menstruation frequency and/or flow. Please call in a few weeks, to tell us how this is going.  We can increase then if you want.

## 2015-03-26 NOTE — Telephone Encounter (Signed)
Pt would like to send in rx for Spironolactone

## 2015-04-11 ENCOUNTER — Other Ambulatory Visit: Payer: Self-pay | Admitting: Family Medicine

## 2015-04-11 NOTE — Telephone Encounter (Signed)
Medication filled to pharmacy as requested.   

## 2015-04-24 ENCOUNTER — Telehealth: Payer: Self-pay | Admitting: Endocrinology

## 2015-04-24 NOTE — Telephone Encounter (Signed)
Requested call back from the pt to discuss.  

## 2015-04-24 NOTE — Telephone Encounter (Signed)
Pt advised of note below and voiced understanding.  

## 2015-04-24 NOTE — Telephone Encounter (Signed)
Patient called stating that she may have had a reaction to her medication   Rx: Spironolactone   Thank you

## 2015-04-24 NOTE — Telephone Encounter (Signed)
Try stopping spironolactone x 1 week, then resume at 1/2 pill per day.

## 2015-04-24 NOTE — Telephone Encounter (Signed)
The pt called to report the following symptoms she has been having since beginning the spirolactone. Pt states she has been having headaches 2-3 times per day. Breast tenderness and itchy dry spots on her scalp and arms.  Please advise, Thanks!

## 2015-05-05 ENCOUNTER — Other Ambulatory Visit: Payer: Self-pay | Admitting: Family Medicine

## 2015-05-07 NOTE — Telephone Encounter (Signed)
Medication filled to pharmacy as requested.   

## 2015-06-14 ENCOUNTER — Telehealth: Payer: Self-pay | Admitting: Endocrinology

## 2015-06-14 NOTE — Telephone Encounter (Signed)
Patient called stating she has a question regarding her spirolactol  [name] states she has been having headaches; she was told to cut dosage Headaches are still continuing    Please advise patient   Call back: [phone]

## 2015-06-15 NOTE — Telephone Encounter (Signed)
Please d/c the medication, as this could be causing headache.

## 2015-06-15 NOTE — Telephone Encounter (Signed)
Please see below and advise.

## 2015-06-15 NOTE — Telephone Encounter (Signed)
Forwarded information 

## 2015-06-15 NOTE — Telephone Encounter (Signed)
Instructions given to patient, she voiced understanding.

## 2015-06-18 ENCOUNTER — Ambulatory Visit: Payer: Self-pay | Admitting: Family Medicine

## 2015-06-20 ENCOUNTER — Ambulatory Visit (INDEPENDENT_AMBULATORY_CARE_PROVIDER_SITE_OTHER): Payer: 59 | Admitting: Family Medicine

## 2015-06-20 ENCOUNTER — Encounter: Payer: Self-pay | Admitting: Family Medicine

## 2015-06-20 VITALS — BP 112/74 | HR 85 | Temp 98.1°F | Resp 16 | Ht 65.0 in | Wt 159.0 lb

## 2015-06-20 DIAGNOSIS — M797 Fibromyalgia: Secondary | ICD-10-CM | POA: Diagnosis not present

## 2015-06-20 DIAGNOSIS — R51 Headache: Secondary | ICD-10-CM

## 2015-06-20 DIAGNOSIS — R519 Headache, unspecified: Secondary | ICD-10-CM

## 2015-06-20 MED ORDER — MELOXICAM 15 MG PO TABS: ORAL_TABLET | ORAL | Status: DC

## 2015-06-20 NOTE — Progress Notes (Signed)
Pre visit review using our clinic review tool, if applicable. No additional management support is needed unless otherwise documented below in the visit note. 

## 2015-06-20 NOTE — Assessment & Plan Note (Signed)
New.  Pt reports this was in response to an intolerance to Spironolactone.  She stopped this medication 5 days ago w/ some improvement in daily headache.  Encouraged increased fluid intake.  If no improvement, discussed neuro referral.  Reviewed supportive care and red flags that should prompt return.  Pt expressed understanding and is in agreement w/ plan.

## 2015-06-20 NOTE — Patient Instructions (Signed)
Schedule your complete physical in 6 months Keep up the good work!  You look great! I'm so proud of the exercise!  Keep it up!!! If the headaches persist, let me know so we can refer you to neuro for complete evaluation Drink plenty of water- this will help headaches Call with any questions or concerns If you want to join us at the new [place] office, any scheduled appointments will automatically transfer and we will see you at [address], [place], [place] [id number]  Happy Fall!!!

## 2015-06-20 NOTE — Assessment & Plan Note (Signed)
Chronic problem.  Much improved w/ better depression control and regular daily exercise.  Her pain, mood, and energy levels are all improved.  No med changes.  Will continue to follow.

## 2015-06-20 NOTE — Progress Notes (Signed)
   Subjective:    Patient ID: [name], female    DOB: May 17, 1976, 39 y.o.   MRN: [id number]  HPI Fibro- pt reports intermittent flare ups.  Pt reports overall pain is 'really good'.  Currently on Zoloft, Abilify, and Mobic for inflammation.  Sleeping well.  Mood is good.  Walking 4.5 miles daily during the week and 6 miles on the weekend.  HAs- pt reports she is having daily headaches.  Did not tolerate spironolactone due to headaches.  Stopped med completely 5 days ago.  Some improvement in HA.  Taking daily antihistamine.  She had recent eye exam and prescription was changed.   Review of Systems For ROS see HPI     Objective:   Physical Exam  Constitutional: She is oriented to person, place, and time. She appears well-developed and well-nourished. No distress.  HENT:  Head: Normocephalic and atraumatic.  Eyes: Conjunctivae and EOM are normal. Pupils are equal, round, and reactive to light.  Neck: Normal range of motion. Neck supple. No thyromegaly present.  Cardiovascular: Normal rate, regular rhythm, normal heart sounds and intact distal pulses.   No murmur heard. Pulmonary/Chest: Effort normal and breath sounds normal. No respiratory distress.  Abdominal: Soft. She exhibits no distension. There is no tenderness.  Musculoskeletal: She exhibits no edema.  Lymphadenopathy:    She has no cervical adenopathy.  Neurological: She is alert and oriented to person, place, and time.  Skin: Skin is warm and dry.  Psychiatric: She has a normal mood and affect. Her behavior is normal.  Vitals reviewed.         Assessment & Plan:

## 2015-07-27 ENCOUNTER — Other Ambulatory Visit: Payer: Self-pay | Admitting: Family Medicine

## 2015-07-27 NOTE — Telephone Encounter (Signed)
Medication filled to pharmacy as requested.   

## 2015-08-30 ENCOUNTER — Other Ambulatory Visit: Payer: Self-pay | Admitting: Family Medicine

## 2015-09-30 ENCOUNTER — Other Ambulatory Visit: Payer: Self-pay | Admitting: Family Medicine

## 2015-10-01 NOTE — Telephone Encounter (Signed)
Medication filled to pharmacy as requested.   

## 2015-10-02 ENCOUNTER — Other Ambulatory Visit: Payer: Self-pay

## 2015-10-02 MED ORDER — LEVOTHYROXINE SODIUM 150 MCG PO TABS: 150.0000 ug | ORAL_TABLET | Freq: Every day | ORAL | Status: DC

## 2015-10-15 ENCOUNTER — Encounter: Payer: Self-pay | Admitting: Family Medicine

## 2015-10-15 ENCOUNTER — Ambulatory Visit (INDEPENDENT_AMBULATORY_CARE_PROVIDER_SITE_OTHER): Payer: 59 | Admitting: Family Medicine

## 2015-10-15 VITALS — BP 110/76 | HR 100 | Temp 98.4°F | Ht 65.0 in | Wt 163.4 lb

## 2015-10-15 DIAGNOSIS — E032 Hypothyroidism due to medicaments and other exogenous substances: Secondary | ICD-10-CM | POA: Diagnosis not present

## 2015-10-15 DIAGNOSIS — R41840 Attention and concentration deficit: Secondary | ICD-10-CM

## 2015-10-15 DIAGNOSIS — L850 Acquired ichthyosis: Secondary | ICD-10-CM

## 2015-10-15 DIAGNOSIS — L739 Follicular disorder, unspecified: Secondary | ICD-10-CM

## 2015-10-15 DIAGNOSIS — L853 Xerosis cutis: Secondary | ICD-10-CM | POA: Insufficient documentation

## 2015-10-15 MED ORDER — TRIAMCINOLONE ACETONIDE 0.1 % EX OINT: 1.0000 "application " | TOPICAL_OINTMENT | Freq: Two times a day (BID) | CUTANEOUS | Status: DC

## 2015-10-15 NOTE — Progress Notes (Signed)
Pre visit review using our clinic review tool, if applicable. No additional management support is needed unless otherwise documented below in the visit note. 

## 2015-10-15 NOTE — Progress Notes (Signed)
   Subjective:    Patient ID: [name], female    DOB: [birth year], 40 y.o.   MRN: [id number]  HPI Bumps on hair line- sxs started 'a month or so ago'.  Pt reports bumps started in hairline behind ear.  Areas have spread to face and wrist.  Pt has tried switching shampoos but no relief.  Bumps are painful.  Very dry skin- pt reports itchy, very dry skin that despite using moisture rich lotions she continues to struggle.  Taking B12 and Vit D supplement  Difficulty w/ concentration/focus   Review of Systems For ROS see HPI     Objective:   Physical Exam  Constitutional: She is oriented to person, place, and time. She appears well-developed and well-nourished. No distress.  HENT:  Head: Normocephalic and atraumatic.  No bumps noticed along hairline  Neurological: She is alert and oriented to person, place, and time.  Skin: Skin is warm and dry. No rash noted. No erythema.  Psychiatric: She has a normal mood and affect. Her behavior is normal. Thought content normal.  Vitals reviewed.         Assessment & Plan:

## 2015-10-15 NOTE — Patient Instructions (Signed)
Follow up as needed We'll notify you of your lab results and make any changes if needed Drink plenty of fluids Apply the triamcinolone ointment twice daily to the very itchy areas if needed Stay well moisturized Try and limit showers to once daily Call with any questions or concerns Hang in there!!!

## 2015-10-16 LAB — BASIC METABOLIC PANEL
BUN: 15 mg/dL (ref 6–23)
CO2: 30 mEq/L (ref 19–32)
Calcium: 9.2 mg/dL (ref 8.4–10.5)
Chloride: 103 mEq/L (ref 96–112)
Creatinine, Ser: 0.77 mg/dL (ref 0.40–1.20)
GFR: 88.05 mL/min (ref >60.00)
Glucose, Bld: 95 mg/dL (ref 70–99)
Potassium: 3.8 mEq/L (ref 3.5–5.1)
Sodium: 138 mEq/L (ref 135–145)

## 2015-10-16 LAB — CBC WITH DIFFERENTIAL/PLATELET
Basophils Absolute: 0.1 10*3/uL (ref 0.0–0.1)
Basophils Relative: 1.6 % (ref 0.0–3.0)
Eosinophils Absolute: 0.2 10*3/uL (ref 0.0–0.7)
Eosinophils Relative: 2.4 % (ref 0.0–5.0)
HCT: 37 % (ref 36.0–46.0)
Hemoglobin: 12.4 g/dL (ref 12.0–15.0)
Lymphocytes Relative: 20.6 % (ref 12.0–46.0)
Lymphs Abs: 1.5 10*3/uL (ref 0.7–4.0)
MCHC: 33.7 g/dL (ref 30.0–36.0)
MCV: 89 fl (ref 78.0–100.0)
Monocytes Absolute: 0.4 10*3/uL (ref 0.1–1.0)
Monocytes Relative: 5.5 % (ref 3.0–12.0)
Neutro Abs: 5 10*3/uL (ref 1.4–7.7)
Neutrophils Relative %: 69.9 % (ref 43.0–77.0)
Platelets: 316 10*3/uL (ref 150.0–400.0)
RBC: 4.15 Mil/uL (ref 3.87–5.11)
RDW: 14.7 % (ref 11.5–15.5)
WBC: 7.1 10*3/uL (ref 4.0–10.5)

## 2015-10-16 NOTE — Assessment & Plan Note (Signed)
New.  The bumps along her hairline that are painful are consistent w/ folliculitis.  No areas visible for inspection today.  Pt is over washing her hair- up to 2-3x/day.  Encouraged her to decrease back to once daily at most to prevent overdrying of skin and hair that will clog follicles.  Pt expressed understanding and is in agreement w/ plan.

## 2015-10-16 NOTE — Assessment & Plan Note (Signed)
New.  Suspect this is due to her ongoing depression- particularly now that son has left for the military- but will check labs to r/o metabolic cause.

## 2015-10-16 NOTE — Assessment & Plan Note (Signed)
New.  Pt has what appears to be eczematous patches.  Start topical steroid ointment.  Reviewed lifestyle modifications and supportive care.  Pt expressed understanding and is in agreement w/ plan.

## 2015-10-16 NOTE — Assessment & Plan Note (Signed)
Chronic problem.  Pt's dry skin and poor concentration may be related to abnormal thyroid levels.  Check labs.  Replete prn.

## 2015-10-17 ENCOUNTER — Encounter: Payer: Self-pay | Admitting: General Practice

## 2015-10-17 LAB — B12 AND FOLATE PANEL
Folate: >23.6 ng/mL (ref >5.9)
Vitamin B-12: >1500 pg/mL — ABNORMAL HIGH (ref 211–911)

## 2015-10-17 LAB — VITAMIN D 25 HYDROXY (VIT D DEFICIENCY, FRACTURES): VITD: 67.13 ng/mL (ref 30.00–100.00)

## 2015-10-17 LAB — TSH: TSH: 1.06 u[IU]/mL (ref 0.35–4.50)

## 2015-10-29 ENCOUNTER — Other Ambulatory Visit: Payer: Self-pay

## 2015-10-29 ENCOUNTER — Other Ambulatory Visit: Payer: Self-pay | Admitting: General Practice

## 2015-10-29 MED ORDER — METFORMIN HCL ER 500 MG PO TB24: 500.0000 mg | ORAL_TABLET | Freq: Every day | ORAL | Status: DC

## 2015-10-29 MED ORDER — SERTRALINE HCL 100 MG PO TABS: 100.0000 mg | ORAL_TABLET | Freq: Every day | ORAL | Status: DC

## 2015-11-06 ENCOUNTER — Other Ambulatory Visit: Payer: Self-pay | Admitting: General Practice

## 2015-11-06 MED ORDER — ARIPIPRAZOLE 5 MG PO TABS: 5.0000 mg | ORAL_TABLET | Freq: Every day | ORAL | Status: DC

## 2015-12-10 ENCOUNTER — Encounter: Payer: Self-pay | Admitting: Medical

## 2015-12-10 ENCOUNTER — Ambulatory Visit (INDEPENDENT_AMBULATORY_CARE_PROVIDER_SITE_OTHER): Payer: 59 | Admitting: Medical

## 2015-12-10 VITALS — BP 100/70 | HR 90 | Temp 98.0°F | Ht 65.0 in | Wt 169.2 lb

## 2015-12-10 DIAGNOSIS — J0100 Acute maxillary sinusitis, unspecified: Secondary | ICD-10-CM | POA: Diagnosis not present

## 2015-12-10 DIAGNOSIS — J309 Allergic rhinitis, unspecified: Secondary | ICD-10-CM | POA: Diagnosis not present

## 2015-12-10 MED ORDER — BENZONATATE 100 MG PO CAPS: 100.0000 mg | ORAL_CAPSULE | Freq: Three times a day (TID) | ORAL | Status: DC | PRN

## 2015-12-10 MED ORDER — CEFDINIR 300 MG PO CAPS: 300.0000 mg | ORAL_CAPSULE | Freq: Two times a day (BID) | ORAL | Status: DC

## 2015-12-10 MED ORDER — LEVOCETIRIZINE DIHYDROCHLORIDE 5 MG PO TABS: 5.0000 mg | ORAL_TABLET | Freq: Every evening | ORAL | Status: DC

## 2015-12-10 MED ORDER — FLUTICASONE PROPIONATE 50 MCG/ACT NA SUSP: 2.0000 | Freq: Every day | NASAL | Status: DC

## 2015-12-10 NOTE — Progress Notes (Signed)
Pre visit review using our clinic review tool, if applicable. No additional management support is needed unless otherwise documented below in the visit note. 

## 2015-12-10 NOTE — Progress Notes (Signed)
Subjective:    Patient ID: [name], female    DOB: 1976/08/01, 40 y.o.   MRN: [id number]  HPI   Pt in with sinus pressure. Pt has symptoms since last week. Pt reported nasal congestion, runny nose and some pnd.   Raspy cough.  LMP- December 06, 2015.  Pt tried mucinex and did not help much.  Pt tried Careers adviser, claritin, and benadry.   Review of Systems  Constitutional: Negative for fever, chills and fatigue.       On and off hot and cold.  HENT: Positive for congestion, postnasal drip, rhinorrhea and sinus pressure.   Respiratory: Positive for cough. Negative for shortness of breath and wheezing.   Cardiovascular: Negative for chest pain and palpitations.  Gastrointestinal: Negative for abdominal pain.  Musculoskeletal: Negative for myalgias.  Skin: Negative for rash.  Neurological: Negative for dizziness, syncope, numbness and headaches.  Hematological: Negative for adenopathy. Does not bruise/bleed easily.   Past Medical History  Diagnosis Date  . Fibromyalgia   . Hypothyroidism 1999    post PTU Rx for hyperthyroidism  . IBS (irritable bowel syndrome)   . Depression   . Anxiety   . Arthritis      Social History   Social History  . Marital Status: Married    Spouse Name: N/A  . Number of Children: N/A  . Years of Education: N/A   Occupational History  . insurance agent    Social History Main Topics  . Smoking status: Never Smoker   . Smokeless tobacco: Not on file  . Alcohol Use: Yes  . Drug Use: No  . Sexual Activity: Not on file   Other Topics Concern  . Not on file   Social History Narrative    Past Surgical History  Procedure Laterality Date  . Cesarean section      x 2    Family History  Problem Relation Age of Onset  . Diabetes Paternal Grandmother   . ADD / ADHD Son   . Hyperlipidemia Mother   . Diabetes Father   . Cancer Paternal Grandfather     lung  . Other Neg Hx     PCO    Allergies  Allergen Reactions  . Methimazole  Other (See Comments)    Body goes into arthritic shock    Current Outpatient Prescriptions on File Prior to Visit  Medication Sig Dispense Refill  . ARIPiprazole (ABILIFY) 5 MG tablet Take 1 tablet (5 mg total) by mouth daily. 90 tablet 1  . levothyroxine (SYNTHROID, LEVOTHROID) 150 MCG tablet Take 1 tablet (150 mcg total) by mouth daily. 90 tablet 0  . meloxicam (MOBIC) 15 MG tablet TAKE 1/2 TO 1 TABLET BY MOUTH ONCE DAILY AS NEEDED FOR PAIN 30 tablet 2  . metFORMIN (GLUCOPHAGE-XR) 500 MG 24 hr tablet Take 1 tablet (500 mg total) by mouth daily with breakfast. 90 tablet 2  . sertraline (ZOLOFT) 100 MG tablet Take 1 tablet (100 mg total) by mouth daily. 90 tablet 1  . triamcinolone ointment (KENALOG) 0.1 % Apply 1 application topically 2 (two) times daily. 90 g 1   No current facility-administered medications on file prior to visit.    BP 100/70 mmHg  Pulse 90  Temp(Src) 98 F (36.7 C) (Oral)  Ht  (1.651 m)  Wt 169 lb 3.2 oz (76.749 kg)  BMI 28.16 kg/m2  SpO2 98%  LMP 12/06/2015       Objective:   Physical Exam   General  Mental Status - Alert. General Appearance - Well groomed. Not in acute distress.  Skin Rashes- No Rashes.  HEENT Head- Normal. Ear Auditory Canal - Left- Normal. Right - Normal.Tympanic Membrane- Left- Normal. Right- Normal. Eye Sclera/Conjunctiva- Left- Normal. Right- Normal. Nose & Sinuses Nasal Mucosa- Left-  Boggy and Congested. Right-  Boggy and  Congested.Bilateral maxillary and frontal sinus pressure. Mouth & Throat Lips: Upper Lip- Normal: no dryness, cracking, pallor, cyanosis, or vesicular eruption. Lower Lip-Normal: no dryness, cracking, pallor, cyanosis or vesicular eruption. Buccal Mucosa- Bilateral- No Aphthous ulcers. Oropharynx- No Discharge or Erythema. Tonsils: Characteristics- Bilateral- No Erythema or Congestion. Size/Enlargement- Bilateral- No enlargement. Discharge- bilateral-None.  Neck Neck- Supple. No  Masses.   Chest and Lung Exam Auscultation: Breath Sounds:-Clear even and unlabored.  Cardiovascular Auscultation:Rythm- Regular, rate and rhythm. Murmurs & Other Heart Sounds:Ausculatation of the heart reveal- No Murmurs.  Lymphatic Head & Neck General Head & Neck Lymphatics: Bilateral: Description- No Localized lymphadenopathy.      Assessment & Plan:  For allergies will rx flonase and xyzal.  For sinus infection following the allergies will rx cefdnir antibiotic.  For cough will rx benzonatate.  Follow up in 7 days or as needed

## 2015-12-10 NOTE — Patient Instructions (Addendum)
For allergies will rx flonase and xyzal.  For sinus infection following the allergies will rx cefdnir antibiotic.  For cough will rx benzonatate.  Follow up in 7 days or as needed

## 2015-12-19 ENCOUNTER — Encounter: Payer: 59 | Admitting: Family Medicine

## 2015-12-31 ENCOUNTER — Other Ambulatory Visit: Payer: Self-pay | Admitting: General Practice

## 2015-12-31 MED ORDER — LEVOTHYROXINE SODIUM 150 MCG PO TABS: 150.0000 ug | ORAL_TABLET | Freq: Every day | ORAL | Status: DC

## 2016-01-03 ENCOUNTER — Encounter: Payer: Self-pay | Admitting: Family Medicine

## 2016-01-07 ENCOUNTER — Ambulatory Visit (INDEPENDENT_AMBULATORY_CARE_PROVIDER_SITE_OTHER): Payer: 59 | Admitting: Family Medicine

## 2016-01-07 ENCOUNTER — Encounter: Payer: Self-pay | Admitting: Family Medicine

## 2016-01-07 VITALS — BP 110/80 | HR 95 | Temp 98.0°F | Resp 16 | Ht 65.0 in | Wt 168.5 lb

## 2016-01-07 DIAGNOSIS — R519 Headache, unspecified: Secondary | ICD-10-CM

## 2016-01-07 DIAGNOSIS — F411 Generalized anxiety disorder: Secondary | ICD-10-CM

## 2016-01-07 DIAGNOSIS — R51 Headache: Secondary | ICD-10-CM

## 2016-01-07 MED ORDER — BUSPIRONE HCL 15 MG PO TABS: 15.0000 mg | ORAL_TABLET | Freq: Two times a day (BID) | ORAL | Status: DC

## 2016-01-07 MED ORDER — SUMATRIPTAN SUCCINATE 50 MG PO TABS: 50.0000 mg | ORAL_TABLET | ORAL | Status: DC | PRN

## 2016-01-07 MED ORDER — CYCLOBENZAPRINE HCL 10 MG PO TABS: 10.0000 mg | ORAL_TABLET | Freq: Three times a day (TID) | ORAL | Status: DC | PRN

## 2016-01-07 MED ORDER — ALPRAZOLAM 0.5 MG PO TABS: 0.5000 mg | ORAL_TABLET | Freq: Two times a day (BID) | ORAL | Status: DC | PRN

## 2016-01-07 NOTE — Patient Instructions (Signed)
Follow up in 3-4 weeks to reassess headaches and anxiety Start the Flexeril before bed and as needed for muscle spasm- will cause drowsiness Start the Buspar twice daily for the anxiety Use the Alprazolam as needed for panicked moments Take Excedrin at the first sign of headache.  If no improvement after 20-30 minutes or the headache is severe, take the Imitrex Drink plenty of fluids Call with any questions or concerns Hang in there!!!

## 2016-01-07 NOTE — Progress Notes (Signed)
Pre visit review using our clinic review tool, if applicable. No additional management support is needed unless otherwise documented below in the visit note. 

## 2016-01-07 NOTE — Assessment & Plan Note (Signed)
Deteriorated recently w/ son joining [employer].  This is the biggest contributing factor to her HAs.  Start Buspar for daily anxiety control and add Alprazolam for prn use.  Reviewed supportive care and red flags that should prompt return.  Pt expressed understanding and is in agreement w/ plan.

## 2016-01-07 NOTE — Assessment & Plan Note (Signed)
Pt has hx of similar but this has worsened in the past 2 weeks.  She admits to increased stress.  + trap spasm.  Pt's description of HA is consistent w/ trap spasm causing tension HA and possibly precipitating migraine.  Start Flexeril nightly.  Imitrex prn- but lower dose than what her husband is taking.  Reviewed supportive care and red flags that should prompt return.  Pt expressed understanding and is in agreement w/ plan.

## 2016-01-07 NOTE — Progress Notes (Signed)
   Subjective:    Patient ID: [name], female    DOB: [birth year], 40 y.o.   MRN: [id number]  HPI HA- pt reports HAs will feel like a vice on either side of her temples or tightness in the neck.  Occuring daily x2 weeks.  Took 100mg  Imitrex which was 'way too strong' but HA improved after 1 hr.  HAs will start in neck and come around to the front.  Increased stress worrying about son in the [employer].  Occasional nausea w/ HA.  Some sensitivity to sound but not light.  No dizziness.  No relief w/ tylenol or ibuprofen.  No relief w/ mobic or excedrin.    Review of Systems For ROS see HPI     Objective:   Physical Exam  Constitutional: She is oriented to person, place, and time. She appears well-developed and well-nourished. No distress.  HENT:  Head: Normocephalic and atraumatic.  TMs WNL No TTP over sinuses Minimal nasal congestion  Eyes: Conjunctivae and EOM are normal. Pupils are equal, round, and reactive to light.  Neck: Normal range of motion. Neck supple.  Cardiovascular: Normal rate, regular rhythm, normal heart sounds and intact distal pulses.   Pulmonary/Chest: Effort normal and breath sounds normal. No respiratory distress. She has no wheezes. She has no rales.  Musculoskeletal: She exhibits tenderness (TTP over trap spasm bilaterally).  Lymphadenopathy:    She has no cervical adenopathy.  Neurological: She is alert and oriented to person, place, and time. She has normal reflexes. No cranial nerve deficit. Coordination normal.  Psychiatric: She has a normal mood and affect. Her behavior is normal. Judgment and thought content normal.  Vitals reviewed.         Assessment & Plan:

## 2016-01-24 LAB — HM MAMMOGRAPHY: HM Mammogram: NORMAL (ref 0–4)

## 2016-01-24 LAB — HM PAP SMEAR

## 2016-01-28 ENCOUNTER — Other Ambulatory Visit: Payer: Self-pay | Admitting: General Practice

## 2016-01-28 MED ORDER — MELOXICAM 15 MG PO TABS: ORAL_TABLET | ORAL | Status: DC

## 2016-02-06 ENCOUNTER — Ambulatory Visit: Payer: 59 | Admitting: Family Medicine

## 2016-02-20 ENCOUNTER — Encounter: Payer: Self-pay | Admitting: Family Medicine

## 2016-02-20 ENCOUNTER — Ambulatory Visit (INDEPENDENT_AMBULATORY_CARE_PROVIDER_SITE_OTHER): Payer: 59 | Admitting: Family Medicine

## 2016-02-20 VITALS — BP 110/74 | HR 92 | Temp 98.0°F | Resp 16 | Ht 65.0 in | Wt 172.4 lb

## 2016-02-20 DIAGNOSIS — R51 Headache: Secondary | ICD-10-CM | POA: Diagnosis not present

## 2016-02-20 DIAGNOSIS — F411 Generalized anxiety disorder: Secondary | ICD-10-CM | POA: Diagnosis not present

## 2016-02-20 DIAGNOSIS — R519 Headache, unspecified: Secondary | ICD-10-CM

## 2016-02-20 MED ORDER — SUMATRIPTAN SUCCINATE 50 MG PO TABS: 50.0000 mg | ORAL_TABLET | ORAL | Status: DC | PRN

## 2016-02-20 NOTE — Patient Instructions (Signed)
Schedule your complete physical in 4 months No med changes at this time Keep up the good work- you look great!!! Call with any questions or concerns Have a great summer!!!

## 2016-02-20 NOTE — Progress Notes (Signed)
Pre visit review using our clinic review tool, if applicable. No additional management support is needed unless otherwise documented below in the visit note. 

## 2016-02-20 NOTE — Progress Notes (Signed)
   Subjective:    Patient ID: Lurene ShadowShelley Wildasin, female    DOB: Nov 24, 1975, 40 y.o.   MRN: [id number]  HPI Anxiety- ongoing issue for pt.  Was severe at last visit and Buspar and Alprazolam were added to her Zoloft and Abilify.  Pt reports feeling better- 'it seems to be working'.  No fatigue or agitation w/ Buspar.  Husband has noticed a difference.  HAs are much improved, 'i get them very rarely'.  Has only required 2 alprazolam since last visit.     Review of Systems For ROS see HPI     Objective:   Physical Exam  Constitutional: She is oriented to person, place, and time. She appears well-developed and well-nourished. No distress.  HENT:  Head: Normocephalic and atraumatic.  Eyes: Conjunctivae and EOM are normal. Pupils are equal, round, and reactive to light.  Neurological: She is alert and oriented to person, place, and time.  Skin: Skin is warm and dry.  Psychiatric: She has a normal mood and affect. Her behavior is normal. Thought content normal.  Vitals reviewed.         Assessment & Plan:

## 2016-02-20 NOTE — Assessment & Plan Note (Signed)
Improved.  Pt is doing much better since her anxiety is under control.  Refill provided on Imitrex for PRN use.  Will continue to follow.

## 2016-02-20 NOTE — Assessment & Plan Note (Signed)
Improved since adding Buspar.  Pt is rarely using Alprazolam.  No med changes at this time.  Will follow.

## 2016-03-29 ENCOUNTER — Other Ambulatory Visit: Payer: Self-pay | Admitting: Family Medicine

## 2016-04-09 ENCOUNTER — Other Ambulatory Visit: Payer: Self-pay | Admitting: Family Medicine

## 2016-04-25 ENCOUNTER — Other Ambulatory Visit: Payer: Self-pay | Admitting: Family Medicine

## 2016-05-01 ENCOUNTER — Other Ambulatory Visit: Payer: Self-pay | Admitting: Family Medicine

## 2016-05-08 ENCOUNTER — Other Ambulatory Visit: Payer: Self-pay | Admitting: Family Medicine

## 2016-05-22 ENCOUNTER — Other Ambulatory Visit: Payer: Self-pay | Admitting: General Practice

## 2016-05-22 MED ORDER — BUSPIRONE HCL 15 MG PO TABS: 15.0000 mg | ORAL_TABLET | Freq: Two times a day (BID) | ORAL | 0 refills | Status: DC

## 2016-06-23 ENCOUNTER — Encounter: Payer: Self-pay | Admitting: General Practice

## 2016-06-23 ENCOUNTER — Encounter: Payer: Self-pay | Admitting: Family Medicine

## 2016-06-23 ENCOUNTER — Ambulatory Visit (INDEPENDENT_AMBULATORY_CARE_PROVIDER_SITE_OTHER): Payer: 59 | Admitting: Family Medicine

## 2016-06-23 VITALS — BP 110/74 | HR 101 | Temp 98.3°F | Resp 16 | Ht 65.0 in | Wt 182.2 lb

## 2016-06-23 DIAGNOSIS — Z0000 Encounter for general adult medical examination without abnormal findings: Secondary | ICD-10-CM | POA: Diagnosis not present

## 2016-06-23 DIAGNOSIS — E039 Hypothyroidism, unspecified: Secondary | ICD-10-CM

## 2016-06-23 LAB — BASIC METABOLIC PANEL
BUN: 12 mg/dL (ref 6–23)
CO2: 27 mEq/L (ref 19–32)
Calcium: 9.2 mg/dL (ref 8.4–10.5)
Chloride: 106 mEq/L (ref 96–112)
Creatinine, Ser: 0.69 mg/dL (ref 0.40–1.20)
GFR: 99.6 mL/min (ref >60.00)
Glucose, Bld: 98 mg/dL (ref 70–99)
Potassium: 4.2 mEq/L (ref 3.5–5.1)
Sodium: 141 mEq/L (ref 135–145)

## 2016-06-23 LAB — CBC WITH DIFFERENTIAL/PLATELET
Basophils Absolute: 0 10*3/uL (ref 0.0–0.1)
Basophils Relative: 0.6 % (ref 0.0–3.0)
Eosinophils Absolute: 0.2 10*3/uL (ref 0.0–0.7)
Eosinophils Relative: 2.9 % (ref 0.0–5.0)
HCT: 36.5 % (ref 36.0–46.0)
Hemoglobin: 12.2 g/dL (ref 12.0–15.0)
Lymphocytes Relative: 19 % (ref 12.0–46.0)
Lymphs Abs: 1.1 10*3/uL (ref 0.7–4.0)
MCHC: 33.4 g/dL (ref 30.0–36.0)
MCV: 87.6 fl (ref 78.0–100.0)
Monocytes Absolute: 0.4 10*3/uL (ref 0.1–1.0)
Monocytes Relative: 7.7 % (ref 3.0–12.0)
Neutro Abs: 4 10*3/uL (ref 1.4–7.7)
Neutrophils Relative %: 69.8 % (ref 43.0–77.0)
Platelets: 314 10*3/uL (ref 150.0–400.0)
RBC: 4.16 Mil/uL (ref 3.87–5.11)
RDW: 14.9 % (ref 11.5–15.5)
WBC: 5.8 10*3/uL (ref 4.0–10.5)

## 2016-06-23 LAB — HEPATIC FUNCTION PANEL
ALT: 15 U/L (ref 0–35)
AST: 19 U/L (ref 0–37)
Albumin: 4 g/dL (ref 3.5–5.2)
Alkaline Phosphatase: 68 U/L (ref 39–117)
Bilirubin, Direct: 0.1 mg/dL (ref 0.0–0.3)
Total Bilirubin: 0.4 mg/dL (ref 0.2–1.2)
Total Protein: 6.7 g/dL (ref 6.0–8.3)

## 2016-06-23 LAB — LIPID PANEL
Cholesterol: 149 mg/dL (ref 0–200)
HDL: 52.4 mg/dL (ref >39.00)
LDL Cholesterol: 74 mg/dL (ref 0–99)
NonHDL: 96.59
Total CHOL/HDL Ratio: 3
Triglycerides: 113 mg/dL (ref 0.0–149.0)
VLDL: 22.6 mg/dL (ref 0.0–40.0)

## 2016-06-23 LAB — T4, FREE: Free T4: 0.95 ng/dL (ref 0.60–1.60)

## 2016-06-23 LAB — VITAMIN D 25 HYDROXY (VIT D DEFICIENCY, FRACTURES): VITD: 31.82 ng/mL (ref 30.00–100.00)

## 2016-06-23 LAB — TSH: TSH: 1.4 u[IU]/mL (ref 0.35–4.50)

## 2016-06-23 LAB — T3, FREE: T3, Free: 4.2 pg/mL (ref 2.3–4.2)

## 2016-06-23 NOTE — Progress Notes (Signed)
Pre visit review using our clinic review tool, if applicable. No additional management support is needed unless otherwise documented below in the visit note. 

## 2016-06-23 NOTE — Patient Instructions (Signed)
Follow up in 3 months to recheck weight loss We'll notify you of your lab results and make any changes if needed Continue to work on healthy diet and regular exercise- you can do it!!! Start using MyFitnessPal app to hold yourself accountable on food choices Call with any questions or concerns Happy Halloween!!!

## 2016-06-23 NOTE — Progress Notes (Signed)
   Subjective:    Patient ID: [name], female    DOB: [birth year], 40 y.o.   MRN: [id number]  HPI CPE- UTD on pap and mammo (June- Dr Cloyde Reamsallahan)pt is concerned about ongoing weight gain.  Has gained 10 lbs since June.  Admits to eating very poorly on the weekends but trying to eat better during the week nights.  Walking 4x/week 3.5-4 miles.  Pt has a home gym but reports low motivation b/c she then has pain.     Review of Systems Patient reports no vision/ hearing changes, adenopathy,fever, weight change,  persistant/recurrent hoarseness , swallowing issues, chest pain, palpitations, edema, persistant/recurrent cough, hemoptysis, dyspnea (rest/exertional/paroxysmal nocturnal), gastrointestinal bleeding (melena, rectal bleeding), abdominal pain, significant heartburn, bowel changes, GU symptoms (dysuria, hematuria, incontinence), Gyn symptoms (abnormal  bleeding, pain),  syncope, focal weakness, memory loss, numbness & tingling, skin/hair/nail changes, abnormal bruising or bleeding, anxiety, or depression.     Objective:   Physical Exam General Appearance:    Alert, cooperative, no distress, appears stated age  Head:    Normocephalic, without obvious abnormality, atraumatic  Eyes:    PERRL, conjunctiva/corneas clear, EOM's intact, fundi    benign, both eyes  Ears:    Normal TM's and external ear canals, both ears  Nose:   Nares normal, septum midline, mucosa normal, no drainage    or sinus tenderness  Throat:   Lips, mucosa, and tongue normal; teeth and gums normal  Neck:   Supple, symmetrical, trachea midline, no adenopathy;    Thyroid: no enlargement/tenderness/nodules  Back:     Symmetric, no curvature, ROM normal, no CVA tenderness  Lungs:     Clear to auscultation bilaterally, respirations unlabored  Chest Wall:    No tenderness or deformity   Heart:    Regular rate and rhythm, S1 and S2 normal, no murmur, rub   or gallop  Breast Exam:    Deferred to GYN  Abdomen:     Soft,  non-tender, bowel sounds active all four quadrants,    no masses, no organomegaly  Genitalia:    Deferred to GYN  Rectal:    Extremities:   Extremities normal, atraumatic, no cyanosis or edema  Pulses:   2+ and symmetric all extremities  Skin:   Skin color, texture, turgor normal, no rashes or lesions  Lymph nodes:   Cervical, supraclavicular, and axillary nodes normal  Neurologic:   CNII-XII intact, normal strength, sensation and reflexes    throughout          Assessment & Plan:

## 2016-06-23 NOTE — Assessment & Plan Note (Signed)
Pt's PE WNL w/ exception of weight gain.  UTD on pap and mammo w/ GYN.  Already had flu shot.  UTD on Tdap.  Check labs.  Anticipatory guidance provided.

## 2016-06-24 ENCOUNTER — Encounter: Payer: 59 | Admitting: Family Medicine

## 2016-07-18 ENCOUNTER — Other Ambulatory Visit: Payer: Self-pay | Admitting: Endocrinology

## 2016-07-23 ENCOUNTER — Ambulatory Visit (INDEPENDENT_AMBULATORY_CARE_PROVIDER_SITE_OTHER): Payer: 59 | Admitting: Physician Assistant

## 2016-07-23 ENCOUNTER — Encounter: Payer: Self-pay | Admitting: Physician Assistant

## 2016-07-23 VITALS — BP 108/72 | HR 109 | Temp 98.2°F | Resp 16 | Ht 65.0 in | Wt 182.0 lb

## 2016-07-23 DIAGNOSIS — M722 Plantar fascial fibromatosis: Secondary | ICD-10-CM | POA: Diagnosis not present

## 2016-07-23 MED ORDER — DICLOFENAC SODIUM 75 MG PO TBEC: 75.0000 mg | DELAYED_RELEASE_TABLET | Freq: Two times a day (BID) | ORAL | 0 refills | Status: DC

## 2016-07-23 NOTE — Progress Notes (Signed)
Pre visit review using our clinic review tool, if applicable. No additional management support is needed unless otherwise documented below in the visit note. 

## 2016-07-23 NOTE — Progress Notes (Signed)
Patient presents to clinic today c/o 4 weeks of R foot pain. Pain is described as throbbing and is on the plantar surface of the foot. Notes some medial ankle swelling over the past week. Denies trauma or injury to the foot. Denies bruising, numbness or tingling. First step in the morning or after prolonged rest is the most painful. Improved with continued ambulation although pain is still present.   Past Medical History:  Diagnosis Date  . Anxiety   . Arthritis   . Depression   . Fibromyalgia   . Hypothyroidism 1999   post PTU Rx for hyperthyroidism  . IBS (irritable bowel syndrome)     Current Outpatient Prescriptions on File Prior to Visit  Medication Sig Dispense Refill  . ALPRAZolam (XANAX) 0.5 MG tablet Take 1 tablet (0.5 mg total) by mouth 2 (two) times daily as needed for anxiety. 60 tablet 1  . ARIPiprazole (ABILIFY) 5 MG tablet TAKE 1 TABLET (5 MG TOTAL) BY MOUTH DAILY. 90 tablet 1  . busPIRone (BUSPAR) 15 MG tablet Take 1 tablet (15 mg total) by mouth 2 (two) times daily. 180 tablet 0  . cyclobenzaprine (FLEXERIL) 10 MG tablet Take 1 tablet (10 mg total) by mouth 3 (three) times daily as needed for muscle spasms. 45 tablet 0  . levothyroxine (SYNTHROID, LEVOTHROID) 150 MCG tablet TAKE 1 TABLET (150 MCG TOTAL) BY MOUTH DAILY. 90 tablet 1  . meloxicam (MOBIC) 15 MG tablet TAKE 1/2 TO 1 TABLET BY MOUTH ONCE DAILY AS NEEDED FOR PAIN 30 tablet 2  . metFORMIN (GLUCOPHAGE-XR) 500 MG 24 hr tablet TAKE 1 TABLET EVERY MORNING WITH BREAKFAST 90 tablet 2  . sertraline (ZOLOFT) 100 MG tablet TAKE ONE TABLET BY MOUTH ONE TIME DAILY 90 tablet 1  . SUMAtriptan (IMITREX) 50 MG tablet Take 1 tablet (50 mg total) by mouth every 2 (two) hours as needed for migraine. May repeat in 2 hours if headache persists or recurs. 10 tablet 6   No current facility-administered medications on file prior to visit.     Allergies  Allergen Reactions  . Methimazole Other (See Comments)    Body goes into  arthritic shock    Family History  Problem Relation Age of Onset  . Diabetes Paternal Grandmother   . Hyperlipidemia Mother   . Diabetes Father   . Cancer Paternal Grandfather     lung  . ADD / ADHD Son   . Other Neg Hx     PCO    Social History   Social History  . Marital status: Married    Spouse name: N/A  . Number of children: N/A  . 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 Smoking status: Never Smoker  . Smokeless tobacco: Never Used  . Alcohol use Yes  . Drug use: No  . Sexual activity: Not Asked   Other Topics Concern  . None   Social History Narrative  . None   Review of Systems - See HPI.  All other ROS are negative.  BP 108/72   Pulse (!) 109   Temp 98.2 F (36.8 C) (Oral)   Resp 16   Ht 5\' 5"  (1.651 m)   Wt 182 lb (82.6 kg)   SpO2 98%   BMI 30.29 kg/m   Physical Exam  Constitutional: She is oriented to person, place, and time and well-developed, well-nourished, and in no distress.  HENT:  Head: Normocephalic  and atraumatic.  Cardiovascular:  Pulses:      Dorsalis pedis pulses are 2+ on the right side.       Posterior tibial pulses are 2+ on the right side.  Pulmonary/Chest: Effort normal.  Musculoskeletal:       Right foot: There is normal range of motion, no swelling and normal capillary refill.       Feet:  Neurological: She is alert and oriented to person, place, and time.  Skin: Skin is warm and dry. No rash noted.  Psychiatric: Affect normal.  Vitals reviewed.   Recent Results (from the past 2160 hour(s))  Lipid panel     Status: None   Collection Time: 06/23/16 11:42 AM  Result Value Ref Range   Cholesterol 149 0 - 200 mg/dL    Comment: ATP III Classification       Desirable:  < 200 mg/dL               Borderline High:  200 - 239 mg/dL          High:  > = 161 mg/dL   Triglycerides 096.0 0.0 - 149.0 mg/dL    Comment: Normal:  <454 mg/dLBorderline High:  150  - 199 mg/dL   HDL 09.81 >19.14 mg/dL   VLDL 78.2 0.0 - 95.6 mg/dL   LDL Cholesterol 74 0 - 99 mg/dL   Total CHOL/HDL Ratio 3     Comment:                Men          Women1/2 Average Risk     3.4          3.3Average Risk          5.0          4.42X Average Risk          9.6          7.13X Average Risk          15.0          11.0                       NonHDL 96.59     Comment: NOTE:  Non-HDL goal should be 30 mg/dL higher than patient's LDL goal (i.e. LDL goal of < 70 mg/dL, would have non-HDL goal of < 100 mg/dL)  Basic metabolic panel     Status: None   Collection Time: 06/23/16 11:42 AM  Result Value Ref Range   Sodium 141 135 - 145 mEq/L   Potassium 4.2 3.5 - 5.1 mEq/L   Chloride 106 96 - 112 mEq/L   CO2 27 19 - 32 mEq/L   Glucose, Bld 98 70 - 99 mg/dL   BUN 12 6 - 23 mg/dL   Creatinine, Ser 2.13 0.40 - 1.20 mg/dL   Calcium 9.2 8.4 - 08.6 mg/dL   GFR 57.84 >69.62 mL/min  TSH     Status: None   Collection Time: 06/23/16 11:42 AM  Result Value Ref Range   TSH 1.40 0.35 - 4.50 uIU/mL  Hepatic function panel     Status: None   Collection Time: 06/23/16 11:42 AM  Result Value Ref Range   Total Bilirubin 0.4 0.2 - 1.2 mg/dL   Bilirubin, Direct 0.1 0.0 - 0.3 mg/dL   Alkaline Phosphatase 68 39 - 117 U/L   AST 19 0 - 37 U/L   ALT 15 0 - 35 U/L  Total Protein 6.7 6.0 - 8.3 g/dL   Albumin 4.0 3.5 - 5.2 g/dL  CBC with Differential/Platelet     Status: None   Collection Time: 06/23/16 11:42 AM  Result Value Ref Range   WBC 5.8 4.0 - 10.5 K/uL   RBC 4.16 3.87 - 5.11 Mil/uL   Hemoglobin 12.2 12.0 - 15.0 g/dL   HCT 16.136.5 09.636.0 - 04.546.0 %   MCV 87.6 78.0 - 100.0 fl   MCHC 33.4 30.0 - 36.0 g/dL   RDW 40.914.9 81.111.5 - 91.415.5 %   Platelets 314.0 150.0 - 400.0 K/uL   Neutrophils Relative % 69.8 43.0 - 77.0 %   Lymphocytes Relative 19.0 12.0 - 46.0 %   Monocytes Relative 7.7 3.0 - 12.0 %   Eosinophils Relative 2.9 0.0 - 5.0 %   Basophils Relative 0.6 0.0 - 3.0 %   Neutro Abs 4.0 1.4 - 7.7 K/uL     Lymphs Abs 1.1 0.7 - 4.0 K/uL   Monocytes Absolute 0.4 0.1 - 1.0 K/uL   Eosinophils Absolute 0.2 0.0 - 0.7 K/uL   Basophils Absolute 0.0 0.0 - 0.1 K/uL  VITAMIN D 25 Hydroxy (Vit-D Deficiency, Fractures)     Status: None   Collection Time: 06/23/16 11:42 AM  Result Value Ref Range   VITD 31.82 30.00 - 100.00 ng/mL  T4, free     Status: None   Collection Time: 06/23/16 11:42 AM  Result Value Ref Range   Free T4 0.95 0.60 - 1.60 ng/dL    Comment: Specimens from patients who are undergoing biotin therapy and /or ingesting biotin supplements may contain high levels of biotin.  The higher biotin concentration in these specimens interferes with this Free T4 assay.  Specimens that contain high levels  of biotin may cause false high results for this Free T4 assay.  Please interpret results in light of the total clinical presentation of the patient.    T3, free     Status: None   Collection Time: 06/23/16 11:42 AM  Result Value Ref Range   T3, Free 4.2 2.3 - 4.2 pg/mL    Assessment/Plan: 1. Plantar fasciitis Supportive measures, compression wrap, supportive foot wear discussed. Rx Diclofenac. Cold can exercises. If not improving, will refer to Podiatry for further management.    Piedad ClimesMartin, Buckley Bradly Cody, PA-C

## 2016-07-23 NOTE — Patient Instructions (Signed)
Please take diclofenac as directed with food over the next week. Continue ace wrap. Wear shoes with good arch support.  Elevate legs while resting.  Do the cold can exercises we discussed.  If not improving, will need referral to podiatry.   Plantar Fasciitis Plantar fasciitis is a painful foot condition that affects the heel. It occurs when the band of tissue that connects the toes to the heel bone (plantar fascia) becomes irritated. This can happen after exercising too much or doing other repetitive activities (overuse injury). The pain from plantar fasciitis can range from mild irritation to severe pain that makes it difficult for you to walk or move. The pain is usually worse in the morning or after you have been sitting or lying down for a while. CAUSES This condition may be caused by:  Standing for long periods of time.  Wearing shoes that do not fit.  Doing high-impact activities, including running, aerobics, and ballet.  Being overweight.  Having an abnormal way of walking (gait).  Having tight calf muscles.  Having high arches in your feet.  Starting a new athletic activity. SYMPTOMS The main symptom of this condition is heel pain. Other symptoms include:  Pain that gets worse after activity or exercise.  Pain that is worse in the morning or after resting.  Pain that goes away after you walk for a few minutes. DIAGNOSIS This condition may be diagnosed based on your signs and symptoms. Your health care provider will also do a physical exam to check for:  A tender area on the bottom of your foot.  A high arch in your foot.  Pain when you move your foot.  Difficulty moving your foot. You may also need to have imaging studies to confirm the diagnosis. These can include:  X-rays.  Ultrasound.  MRI. TREATMENT  Treatment for plantar fasciitis depends on the severity of the condition. Your treatment may include:  Rest, ice, and over-the-counter pain medicines  to manage your pain.  Exercises to stretch your calves and your plantar fascia.  A splint that holds your foot in a stretched, upward position while you sleep (night splint).  Physical therapy to relieve symptoms and prevent problems in the future.  Cortisone injections to relieve severe pain.  Extracorporeal shock wave therapy (ESWT) to stimulate damaged plantar fascia with electrical impulses. It is often used as a last resort before surgery.  Surgery, if other treatments have not worked after 12 months. HOME CARE INSTRUCTIONS  Take medicines only as directed by your health care provider.  Avoid activities that cause pain.  Roll the bottom of your foot over a bag of ice or a bottle of cold water. Do this for 20 minutes, 3-4 times a day.  Perform simple stretches as directed by your health care provider.  Try wearing athletic shoes with air-sole or gel-sole cushions or soft shoe inserts.  Wear a night splint while sleeping, if directed by your health care provider.  Keep all follow-up appointments with your health care provider. PREVENTION   Do not perform exercises or activities that cause heel pain.  Consider finding low-impact activities if you continue to have problems.  Lose weight if you need to. The best way to prevent plantar fasciitis is to avoid the activities that aggravate your plantar fascia. SEEK MEDICAL CARE IF:  Your symptoms do not go away after treatment with home care measures.  Your pain gets worse.  Your pain affects your ability to move or do your daily  activities. This information is not intended to replace advice given to you by your health care provider. Make sure you discuss any questions you have with your health care provider. Document Released: 05/06/2001 Document Revised: 12/03/2015 Document Reviewed: 06/21/2014 Elsevier Interactive Patient Education  2017 [employer]

## 2016-07-29 ENCOUNTER — Other Ambulatory Visit: Payer: Self-pay | Admitting: Family Medicine

## 2016-09-02 ENCOUNTER — Other Ambulatory Visit: Payer: Self-pay | Admitting: Family Medicine

## 2016-09-08 DIAGNOSIS — N939 Abnormal uterine and vaginal bleeding, unspecified: Secondary | ICD-10-CM | POA: Insufficient documentation

## 2016-09-23 ENCOUNTER — Ambulatory Visit: Payer: 59 | Admitting: Family Medicine

## 2016-09-30 ENCOUNTER — Ambulatory Visit (INDEPENDENT_AMBULATORY_CARE_PROVIDER_SITE_OTHER): Payer: 59 | Admitting: Family Medicine

## 2016-09-30 ENCOUNTER — Encounter: Payer: Self-pay | Admitting: Family Medicine

## 2016-09-30 VITALS — BP 112/70 | HR 101 | Temp 98.5°F | Resp 16 | Ht 65.0 in | Wt 181.4 lb

## 2016-09-30 DIAGNOSIS — M79671 Pain in right foot: Secondary | ICD-10-CM

## 2016-09-30 DIAGNOSIS — E669 Obesity, unspecified: Secondary | ICD-10-CM | POA: Diagnosis not present

## 2016-09-30 NOTE — Assessment & Plan Note (Signed)
Ongoing issue for pt.  She has committed to making better food choices.  Started yoga yesterday.  Stressed need for aerobic exercise but she needs to address her plantar fasciitis first (referral placed).  Will continue to follow.

## 2016-09-30 NOTE — Assessment & Plan Note (Signed)
New to provider, ongoing for pt.  She has hx of plantar fasciitis.  Will refer to Sports Med.  Pt expressed understanding and is in agreement w/ plan.

## 2016-09-30 NOTE — Patient Instructions (Signed)
Follow up in 3 months to recheck weight loss progress We'll call you with your Sports Medicine appt Continue to work on healthy diet and regular exercise- you can do it!!! Call with any questions or concerns Hang in there!!!  You've got this!!!

## 2016-09-30 NOTE — Progress Notes (Signed)
Pre visit review using our clinic review tool, if applicable. No additional management support is needed unless otherwise documented below in the visit note. 

## 2016-09-30 NOTE — Progress Notes (Signed)
   Subjective:    Patient ID: [name], female    DOB: August 03, 1976, 41 y.o.   MRN: [id number]  HPI Weight gain- pt's weight is stable from 11/29.  Pt reports she is eating very well during the week and then allowing a 'treat here or there on the weekend'.  Started yoga yesterday- 15 minutes.  Unable to walk her 4 miles/day b/c of foot pain (hx of plantar fasciitis)     Review of Systems For ROS see HPI     Objective:   Physical Exam  Constitutional: She is oriented to person, place, and time. She appears well-developed and well-nourished. No distress.  HENT:  Head: Normocephalic and atraumatic.  Eyes: Conjunctivae and EOM are normal. Pupils are equal, round, and reactive to light.  Neck: Normal range of motion. Neck supple. No thyromegaly present.  Cardiovascular: Normal rate, regular rhythm, normal heart sounds and intact distal pulses.   No murmur heard. Pulmonary/Chest: Effort normal and breath sounds normal. No respiratory distress.  Abdominal: Soft. She exhibits no distension. There is no tenderness.  Musculoskeletal: She exhibits no edema.  Lymphadenopathy:    She has no cervical adenopathy.  Neurological: She is alert and oriented to person, place, and time.  Skin: Skin is warm and dry.  Psychiatric: She has a normal mood and affect. Her behavior is normal.  Vitals reviewed.         Assessment & Plan:

## 2016-10-01 ENCOUNTER — Ambulatory Visit (INDEPENDENT_AMBULATORY_CARE_PROVIDER_SITE_OTHER): Payer: 59 | Admitting: Sports Medicine

## 2016-10-01 ENCOUNTER — Ambulatory Visit: Payer: Self-pay

## 2016-10-01 ENCOUNTER — Encounter: Payer: Self-pay | Admitting: Sports Medicine

## 2016-10-01 VITALS — BP 110/82 | HR 109 | Ht 65.5 in | Wt 183.4 lb

## 2016-10-01 DIAGNOSIS — M722 Plantar fascial fibromatosis: Secondary | ICD-10-CM

## 2016-10-01 DIAGNOSIS — M79671 Pain in right foot: Secondary | ICD-10-CM

## 2016-10-01 DIAGNOSIS — M24573 Contracture, unspecified ankle: Secondary | ICD-10-CM | POA: Diagnosis not present

## 2016-10-01 NOTE — Patient Instructions (Addendum)
Keep using the arch straps you have. Do the exercises [name] reviewed with you.  You can call your insurance company to find out how much the orthotics will cost you.  The code for these are L3030.  There will be 2 charges for this.  T he exercise program you can view at: [email or web address]-exercise-code.com using code: [id number]

## 2016-10-01 NOTE — Progress Notes (Signed)
Ellicia Alix - 41 y.o. female MRN [id number]  Date of birth: [birth year]  Office Visit Note: Visit Date: 10/01/2016 PCP: Neena Rhymes, MD Referred by: Sheliah Hatch, MD  Subjective: Chief Complaint  Patient presents with  . Establish Care    right foot pain since 05/2016, previous dx of plantar fasciitis. Pain is constant and at times worse than others. She has tried anti-inflammatories with little relief.    HPI: 3 months of progressive foot pain that is located worse on the right than on the left.  Pain is worse first thing in the morning upon awakening.  Pain is located along the medial aspect of the plantar surface of the heels.  Pain is briefly relieved by wearing high heels.  She reports this was a exacerbated after wearing a pair of very high heels. ROS: No associated numbness or claudication type symptoms.  Otherwise per HPI.  Objective:  VS:  HT:5' 5.5" (166.4 cm)   WT:183 lb 6.4 oz (83.2 kg)  BMI:30.1    BP:110/82  HR:(!) 109bpm  TEMP: ( )  RESP:96 % Physical Exam: Adult female. Alert and appropriate.  In no acute distress.  Lower extremities are overall well aligned with no significant deformity. No significant swelling.}  Distal pulses 2+/4. No significant bruising/ecchymosis or erythema the skin Bilateral feet: Overall well aligned.  Will maintain longitudinal and transverse arches.  Moderate to high longitudinal arch bilaterally.  She does have a small noticeable lump along the posterior medial aspect of the right foot.  This is not present on the left.  This is the focal area of most tenderness.  She is focally tender along both plantar fascia more on the right than on the left.  Markedly tight Achilles with equinus contracture to 90 bilaterally.  Imaging & Procedures: [place] Limited Joint Space Structures Low Bilat  Result Date: 10/01/2016 Limited MSK Ultrasound of: Bilateral feet Images were obtained and interpreted by myself, Gaspar Bidding, DO using a GE  logic E Ultrasound. Findings: Very large plantar fibromatosis on the right with small fusiform deformity of the left plantar fascia.  Plantar fascia insertion measures 0.38 cm on the right. Impression: Plantar fibromatosis right worse than left.  [id number]; 15 minutes spent for Therapeutic exercises as stated in above notes.  This included exercises focusing on stretching, strengthening, with significant focus on eccentric aspects.   Proper technique shown and discussed handout in great detail with ATC.  All questions were discussed and answered.    Assessment & Plan: Visit Diagnoses:    ICD-9-CM ICD-10-CM   1. Plantar fascial fibromatosis of both feet 728.71 M72.2   2. Foot pain, right 729.5 M79.671 [place] LIMITED JOINT SPACE STRUCTURES LOW BILAT  3. Equinus contracture of ankle 718.47 M24.573     Plantar fascial fibromatosis of both feet Consistent with plantar fibromatosis with large fibroma on the posterior medial aspect of the right foot.  There is a small fibroma on the left.  Discussed the importance of cushioned orthotics in the near future.  continuing with compression, eccentric exercises cushioning and we will plan to have her return for custom cushioned orthotics in the near future.  Eccentric Achilles stretches and plantar fascial stretches reviewed in detail with athletic training staff today.  She does have a bilateral Achilles contracture in emphasize the importance of improving this.  She should avoid wearing high heels.   Follow-up: Return in about 8 weeks (around 11/26/2016) for repeat diagnostic ultrasound.  We will call you regarding orthotics.Marland Kitchen  Past Medical/Family/Surgical/Social History: Medications & Allergies reviewed per EMR Patient Active Problem List   Diagnosis Date Noted  . 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 Obesity (BMI 30.0-34.9) 09/30/2016  . Dry skin dermatitis 10/15/2015  . Folliculitis 10/15/2015  . Poor concentration 10/15/2015  .  Headache 06/20/2015  . PCO (polycystic ovaries) 03/02/2015  . Hair loss 01/31/2015  . Acne 01/31/2015  . Allergic rhinitis 01/05/2015  . High risk medications (not anticoagulants) long-term use 10/26/2013  . Nausea alone 12/30/2012  . General medical examination 05/20/2011  . Acute sinusitis 10/03/2010  . Foot pain, right 07/15/2010  . KNEE PAIN, LEFT 05/17/2010  . Anxiety state 04/19/2008  . B12 DEFICIENCY 03/17/2008  . FATIGUE 02/14/2008  . Hypothyroidism 07/26/2007  . Fibromyalgia 07/26/2007  . IRRITABLE BOWEL SYNDROME, HX OF 07/26/2007   Past Medical History:  Diagnosis Date  . Anxiety   . Arthritis   . Depression   . Fibromyalgia   . Hypothyroidism 1999   post PTU Rx for hyperthyroidism  . IBS (irritable bowel syndrome)    Family History  Problem Relation Age of Onset  . Diabetes Paternal Grandmother   . Hyperlipidemia Mother   . Diabetes Father   . Cancer Paternal Grandfather     lung  . ADD / ADHD Son   . Other Neg Hx     PCO   Past Surgical History:  Procedure Laterality Date  . 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 Smoking status: Never Smoker  . Smokeless tobacco: Never Used  . Alcohol use Yes  . Drug use: No  . Sexual activity: Not on file

## 2016-10-03 DIAGNOSIS — M722 Plantar fascial fibromatosis: Secondary | ICD-10-CM | POA: Insufficient documentation

## 2016-10-03 NOTE — Assessment & Plan Note (Signed)
Consistent with plantar fibromatosis with large fibroma on the posterior medial aspect of the right foot.  There is a small fibroma on the left.  Discussed the importance of cushioned orthotics in the near future.  continuing with compression, eccentric exercises cushioning and we will plan to have her return for custom cushioned orthotics in the near future.  Eccentric Achilles stretches and plantar fascial stretches reviewed in detail with athletic training staff today.  She does have a bilateral Achilles contracture in emphasize the importance of improving this.  She should avoid wearing high heels.

## 2016-10-16 ENCOUNTER — Other Ambulatory Visit: Payer: Self-pay | Admitting: Family Medicine

## 2016-10-31 ENCOUNTER — Ambulatory Visit (INDEPENDENT_AMBULATORY_CARE_PROVIDER_SITE_OTHER): Payer: 59 | Admitting: Sports Medicine

## 2016-10-31 VITALS — BP 110/80 | HR 85 | Ht 65.5 in | Wt 182.6 lb

## 2016-10-31 DIAGNOSIS — M79671 Pain in right foot: Secondary | ICD-10-CM | POA: Diagnosis not present

## 2016-10-31 DIAGNOSIS — M722 Plantar fascial fibromatosis: Secondary | ICD-10-CM | POA: Diagnosis not present

## 2016-10-31 NOTE — Progress Notes (Signed)
[name] - 41 y.o. female MRN [id number]  Date of birth: 1976-05-23  Office Visit Note: Visit Date: 10/31/2016 PCP: Neena Rhymes, MD Referred by: Sheliah Hatch, MD  Subjective: Chief Complaint  Patient presents with  . Follow-up    Plantar fascial fibromatosis of both feet   HPI: Patient presents for reevaluation of progressive foot pain.  She has been working on [employer] and is interested in [employer] today.  She has been having some worsening of the anterior aspect of her right ankle since performing therapeutic exercises 2 days ago.  She has not having any significant burning or tingling but does have pain across the anterior lateral aspect of the ankle with any type of dorsiflexion.  She has not tried any specific medications. ROS:  Otherwise per HPI.  Objective:  VS:  HT:5' 5.5" (166.4 cm)   WT:182 lb 9.6 oz (82.8 kg)  BMI:30    BP:110/80  HR:85bpm  TEMP: ( )  RESP:99 % Physical Exam: GENERAL:  WDWN, NAD, Non-toxic appearing PSYCH:  Alert & appropriately interactive  Not depressed or anxious appearing LOWER EXTREMITIES:  No significant rashes/lesions/ulcerations overlying the legs.  No significant pretibial edema.  No clubbing or cyanosis.  DP & PT pulses 2+/4.  Sensation intact to light touch. FOOT & ANKLE:   Overall foot and ankle are well aligned, no significant deformity.   Tender to palpation over the insertion of the plantar fascia bilaterally as well as midsubstance of the plantar fascia.  Palpable nodule appreciated right greater than left.  Moderate TTP over the anterolateral extensor tendons worse with resisted eversion and dorsiflexion.    Inversion, eversion, dorsiflexion and plantar flexion strength 5+/5.    Imaging & Procedures: No results found. PROCEDURE NOTE : CUSTOM ORTHOTICS The patient was fitted for a standard, cushioned, semi-rigid orthotic. The orthotic was heated & placed on the orthotic  stand. The patient was positioned in subtalar neutral position and 10 of ankle dorsiflexion and weight bearing stance some heated orthotic blank. After completion of the molding a stable paste was applied to the orthotic blank. The orthotic was ground to a stable position for weightbearing. The patient ambulated in these and reported they were comfortable without pressure spots.  Blank:  Size W7 - Minimal Cushioned FASTEC Orthotic  Base:  Blue EVA  Postings:  n/a    Assessment & Plan: Problem List Items Addressed This Visit    Foot pain, right    Small amount of tendinitis of the extensor tendons of the foot and ankle.  Sample of Pennsaid provided today.  She would like more this she will call to send in a prescription.      Plantar fascial fibromatosis of both feet - Primary    Custom cushion orthotics created today improvement in the patient's symptoms. Compression information reviewed again today.  Consider injection versus Tenex if any persistent symptoms.         Follow-up: Return as scheduled.   Past Medical/Family/Surgical/Social History: Medications & Allergies reviewed per EMR Patient Active Problem List   Diagnosis Date Noted  . 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 Obesity (BMI 30.0-34.9) 09/30/2016  . Dry skin dermatitis 10/15/2015  . Folliculitis 10/15/2015  . Poor concentration 10/15/2015  . Headache 06/20/2015  . PCO (polycystic ovaries) 03/02/2015  . Hair loss 01/31/2015  . Acne 01/31/2015  . Allergic rhinitis 01/05/2015  . High risk medications (not anticoagulants) long-term use 10/26/2013  . Nausea alone 12/30/2012  .  General medical examination 05/20/2011  . Acute sinusitis 10/03/2010  . Foot pain, right 07/15/2010  . KNEE PAIN, LEFT 05/17/2010  . Anxiety state 04/19/2008  . B12 DEFICIENCY 03/17/2008  . FATIGUE 02/14/2008  . Hypothyroidism 07/26/2007  . Fibromyalgia 07/26/2007  . IRRITABLE BOWEL SYNDROME, HX OF 07/26/2007   Past  Medical History:  Diagnosis Date  . Anxiety   . Arthritis   . Depression   . Fibromyalgia   . Hypothyroidism 1999   post PTU Rx for hyperthyroidism  . IBS (irritable bowel syndrome)    Family History  Problem Relation Age of Onset  . Diabetes Paternal Grandmother   . Hyperlipidemia Mother   . Diabetes Father   . Cancer Paternal Grandfather     lung  . ADD / ADHD Son   . Other Neg Hx     PCO   Past Surgical History:  Procedure Laterality Date  . 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 Smoking status: Never Smoker  . Smokeless tobacco: Never Used  . Alcohol use Yes  . Drug use: No  . Sexual activity: Not on file

## 2016-11-03 NOTE — Assessment & Plan Note (Signed)
Custom cushion orthotics created today improvement in the patient's symptoms. Compression information reviewed again today.  Consider injection versus Tenex if any persistent symptoms.

## 2016-11-03 NOTE — Assessment & Plan Note (Signed)
Small amount of tendinitis of the extensor tendons of the foot and ankle.  Sample of Pennsaid provided today.  She would like more this she will call to send in a prescription.

## 2016-11-08 ENCOUNTER — Other Ambulatory Visit: Payer: Self-pay | Admitting: Physician Assistant

## 2016-11-10 ENCOUNTER — Encounter: Payer: Self-pay | Admitting: Sports Medicine

## 2016-11-10 ENCOUNTER — Encounter: Payer: Self-pay | Admitting: Family Medicine

## 2016-11-28 ENCOUNTER — Ambulatory Visit (INDEPENDENT_AMBULATORY_CARE_PROVIDER_SITE_OTHER): Payer: 59 | Admitting: Sports Medicine

## 2016-11-28 ENCOUNTER — Ambulatory Visit: Payer: Self-pay

## 2016-11-28 ENCOUNTER — Ambulatory Visit (INDEPENDENT_AMBULATORY_CARE_PROVIDER_SITE_OTHER): Payer: 59

## 2016-11-28 VITALS — BP 110/80 | HR 87 | Ht 65.5 in | Wt 185.0 lb

## 2016-11-28 DIAGNOSIS — M79671 Pain in right foot: Secondary | ICD-10-CM | POA: Diagnosis not present

## 2016-11-28 DIAGNOSIS — M722 Plantar fascial fibromatosis: Secondary | ICD-10-CM | POA: Diagnosis not present

## 2016-11-28 NOTE — Progress Notes (Signed)
OFFICE VISIT NOTE [name]. [name] [phone]  [name] - 41 y.o. female MRN [id number]  Date of birth: 1976-02-26  Visit Date: 11/28/2016  PCP: Crystal Rhymes, MD   Referred by: Crystal Hatch, MD  SUBJECTIVE:   Chief Complaint  Patient presents with  . Follow-up    Plantar Fascial Fibromatosis of both feet. Performs Alfredson exercises 3x per week and wearing Orthoics most of the time---per patient "I have more good days than bad days."  Pt feels that her LT foot is worse than it was before but improvement in the RT foot.   HPI: As above. Additional pertinent information includes:  Patient reports above symptoms.  Overall she seems to be somewhat plateaued and actually notices the lesion on the right foot seems to be larger on the left painful with the interventions we have performed.  She is using a custom orthotics that are helpful.  The pain is moderate in severity but rated upwards of 6 especially first thing in the morning awakening.  Pain does seem to persist longer in the mornings now than what it last visit.  Denies fevers, chills, recent weight gain or weight loss.  No night sweats. No significant nighttime awakenings due to this issue. This is interfering with her day-to-day activities.  Not to take any specific medications for this.   ROS: ROS  Otherwise per HPI.  HISTORY & PERTINENT PRIOR DATA:   She reports that she has never smoked. She has never used smokeless tobacco. No results for input(s): HGBA1C, LABURIC in the last 8760 hours. Medications & Allergies reviewed per EMR Patient Active Problem List   Diagnosis Date Noted  . 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 Obesity (BMI 30.0-34.9) 09/30/2016  . Dry skin dermatitis 10/15/2015  . Folliculitis 10/15/2015  . Poor concentration 10/15/2015  . Headache 06/20/2015  . PCO (polycystic ovaries) 03/02/2015  . Hair loss  01/31/2015  . Acne 01/31/2015  . Allergic rhinitis 01/05/2015  . High risk medications (not anticoagulants) long-term use 10/26/2013  . Nausea alone 12/30/2012  . General medical examination 05/20/2011  . Acute sinusitis 10/03/2010  . Foot pain, right 07/15/2010  . KNEE PAIN, LEFT 05/17/2010  . Anxiety state 04/19/2008  . B12 DEFICIENCY 03/17/2008  . FATIGUE 02/14/2008  . Hypothyroidism 07/26/2007  . Fibromyalgia 07/26/2007  . IRRITABLE BOWEL SYNDROME, HX OF 07/26/2007   Past Medical History:  Diagnosis Date  . Anxiety   . Arthritis   . Depression   . Fibromyalgia   . Hypothyroidism 1999   post PTU Rx for hyperthyroidism  . IBS (irritable bowel syndrome)    Family History  Problem Relation Age of Onset  . Diabetes Paternal Grandmother   . Hyperlipidemia Mother   . Diabetes Father   . Cancer Paternal Grandfather     lung  . ADD / ADHD Son   . Other Neg Hx     PCO   Past Surgical History:  Procedure Laterality Date  . 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 Smoking status: Never Smoker  . Smokeless tobacco: Never Used  . Alcohol use Yes  . Drug use: No  . Sexual activity: Not on file    OBJECTIVE:  VS:  HT:5' 5.5" (166.4 cm)   WT:185 lb (83.9 kg)  BMI:30.4  BP:110/80  HR:87bpm  TEMP: ( )  RESP:98 % Physical Exam  Constitutional: She appears well-developed and well-nourished. She is cooperative.  Non-toxic appearance.  HENT:  Head: Normocephalic and atraumatic.  Cardiovascular: Intact distal pulses.   Pulmonary/Chest: No accessory muscle usage. No respiratory distress.  Neurological: She is alert. She is not disoriented. She displays normal reflexes. No sensory deficit.  Skin: Skin is warm, dry and intact. Capillary refill takes less than 2 seconds. No abrasion and no rash noted.  Psychiatric: She has a normal mood and affect. Her speech is normal and behavior  is normal. Thought content normal.   Bilateral feet: Overall well aligned.  Markedly high arch.  She does have a noticeable mass along the medial aspect of her foot.  This is tender to palpation and well-circumscribed/cystic to palpation.  Persistent TTP over the plantar fascial origin as well as midsubstance.  No significant pain with midfoot squeeze or great toe dorsiflexion/plantarflexion.  IMAGING & PROCEDURES: Dg Foot Complete Right  Result Date: 11/28/2016 CLINICAL DATA:  Right foot pain, no known injury progressive right foot pain, no known injury EXAM: RIGHT FOOT COMPLETE - 3+ VIEW COMPARISON:  None. FINDINGS: Three views of the right foot submitted. No acute fracture or subluxation. No radiopaque foreign body. No periosteal reaction or bony erosion. IMPRESSION: Negative. Electronically Signed   By: Natasha Mead M.D.   On: 11/28/2016 17:13   No additional findings.  Limited MSK ultrasound was initiated with images were not saved.  Ultimately decision was made to proceed MRI as below.  The mass lesion was more difficult to visualize in the past and was infiltrating surrounding soft tissue.  There was some increased neovascularity.  ASSESSMENT & PLAN:  Visit Diagnoses:  1. Plantar fascial fibromatosis of both feet   2. Foot pain, right    Meds: No orders of the defined types were placed in this encounter.   Orders:  Orders Placed This Encounter  Procedures  . [place] LIMITED JOINT SPACE STRUCTURES LOW BILAT(NO LINKED CHARGES)  . DG Foot Complete Right  . MR FOOT RIGHT WO CONTRAST    Follow-up: No Follow-up on file.   Otherwise please see problem oriented charting as below.

## 2016-11-29 NOTE — Assessment & Plan Note (Signed)
Foot mass is worse on the right than in the past.  Seems to be infiltrating the plantar fascia completely now and the plantar fascia is not completely visualized at this time along the medial aspect of midfoot.  There is a poorly defined soft tissue mass within this area that has a small amount of neovascularity and no significant hypoechoic change.  Given this is continuing to increase in size further diagnostic evaluation with MRI is warranted at this time.    We will plan to have her follow-up after the MRI is obtained to discuss the findings as well as next interventions which could include cortisone injection versus surgical intervention versus consideration of Tenex

## 2016-12-03 ENCOUNTER — Ambulatory Visit
Admission: RE | Admit: 2016-12-03 | Discharge: 2016-12-03 | Disposition: A | Discharge status: Home / Self Care | Payer: 59 | Source: Ambulatory Visit | Attending: Sports Medicine | Admitting: Sports Medicine

## 2016-12-03 DIAGNOSIS — M722 Plantar fascial fibromatosis: Secondary | ICD-10-CM | POA: Diagnosis not present

## 2016-12-03 DIAGNOSIS — M79671 Pain in right foot: Secondary | ICD-10-CM

## 2016-12-08 ENCOUNTER — Encounter: Payer: Self-pay | Admitting: Sports Medicine

## 2016-12-08 NOTE — Telephone Encounter (Signed)
Please get patient scheduled for follow-up with me in 6 weeks

## 2017-01-15 ENCOUNTER — Other Ambulatory Visit: Payer: Self-pay | Admitting: Family Medicine

## 2017-01-16 ENCOUNTER — Other Ambulatory Visit: Payer: Self-pay | Admitting: Family Medicine

## 2017-01-16 ENCOUNTER — Ambulatory Visit: Payer: Self-pay

## 2017-01-16 ENCOUNTER — Encounter: Payer: Self-pay | Admitting: Sports Medicine

## 2017-01-16 ENCOUNTER — Ambulatory Visit (INDEPENDENT_AMBULATORY_CARE_PROVIDER_SITE_OTHER): Payer: 59 | Admitting: Sports Medicine

## 2017-01-16 VITALS — BP 110/78 | HR 78 | Ht 65.5 in | Wt 170.4 lb

## 2017-01-16 DIAGNOSIS — M722 Plantar fascial fibromatosis: Secondary | ICD-10-CM | POA: Diagnosis not present

## 2017-01-16 NOTE — Assessment & Plan Note (Signed)
She does continue to make slow and steady progress.  She has had a 20 pound weight loss which is likely contributed as well as work on her range of motion which has improved.  She should continue with arch strapping, the previously constructed custom orthotics that she is tolerating well.  She wears sandals she should have some with a formed foot bed such as Birkenstocks which she is wearing today.  Overall she is happy with her progress although frustrated with how long it does take but is not interested in any further intervention at this time.  She will continue with her Alfredson exercises and call if she is having any lack of improvement for consideration of injection.

## 2017-01-16 NOTE — Progress Notes (Signed)
OFFICE VISIT NOTE [name]. [name] [facility] at [facility] [phone]  [name] - 41 y.o. female MRN [id number]  Date of birth: 1976/05/20  Visit Date: 01/16/2017  PCP: Sheliah Hatch, MD   Referred by: Sheliah Hatch, MD  Orlie Dakin, CMA acting as scribe for Dr. Berline Chough.  SUBJECTIVE:   Chief Complaint  Patient presents with  . Plantar Fasciitis    follow up   HPI: As below and per problem based documentation when appropriate.  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  She performs Alfredson exercises 3x per week and wears custom orthoics most of the time. She wears them less in the summer.  Pt reports that her left foot seems to be getting worse. The pain does come and go instead of being a steady pain.  Pt has inflammation medially on right and left foot.  Pt is not taking any specific medications for this problem.   Pt denies fever chills, night sweats.     Review of Systems  Constitutional: Negative for chills and fever.  HENT: Negative.   Eyes: Negative.   Respiratory: Negative for shortness of breath and wheezing.   Cardiovascular: Negative for chest pain and palpitations.  Gastrointestinal: Negative.   Genitourinary: Negative.   Musculoskeletal: Negative.  Negative for falls.  Skin: Negative.   Neurological: Negative for dizziness, tingling and headaches.  Endo/Heme/Allergies: Does not bruise/bleed easily.    Otherwise per HPI.  HISTORY & PERTINENT PRIOR DATA:   She reports that she has never smoked. She has never used smokeless tobacco. No results for input(s): HGBA1C, LABURIC in the last 8760 hours. Medications & Allergies reviewed per EMR Patient Active Problem List   Diagnosis Date Noted  . 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 Obesity (BMI 30.0-34.9) 09/30/2016  . Dry skin dermatitis 10/15/2015  . Folliculitis 10/15/2015  .  Poor concentration 10/15/2015  . Headache 06/20/2015  . PCO (polycystic ovaries) 03/02/2015  . Hair loss 01/31/2015  . Acne 01/31/2015  . Allergic rhinitis 01/05/2015  . High risk medications (not anticoagulants) long-term use 10/26/2013  . Nausea alone 12/30/2012  . General medical examination 05/20/2011  . Acute sinusitis 10/03/2010  . Foot pain, right 07/15/2010  . KNEE PAIN, LEFT 05/17/2010  . Anxiety state 04/19/2008  . B12 DEFICIENCY 03/17/2008  . FATIGUE 02/14/2008  . Hypothyroidism 07/26/2007  . Fibromyalgia 07/26/2007  . IRRITABLE BOWEL SYNDROME, HX OF 07/26/2007   Past Medical History:  Diagnosis Date  . Anxiety   . Arthritis   . Depression   . Fibromyalgia   . Hypothyroidism 1999   post PTU Rx for hyperthyroidism  . IBS (irritable bowel syndrome)    Family History  Problem Relation Age of Onset  . Diabetes Paternal Grandmother   . Hyperlipidemia Mother   . Diabetes Father   . Cancer Paternal Grandfather        lung  . ADD / ADHD Son   . Other Neg Hx        PCO   Past Surgical History:  Procedure Laterality Date  . 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 Smoking status: Never Smoker  . Smokeless tobacco: Never Used  . Alcohol use Yes  . Drug use: No  . Sexual activity: Not on file  OBJECTIVE:  VS:  HT:5' 5.5" (166.4 cm)   WT:170 lb 6.4 oz (77.3 kg)  BMI:28    BP:110/78  HR:78bpm  TEMP: ( )  RESP:97 % EXAM: Findings:  WDWN, NAD, Non-toxic appearing Alert & appropriately interactive Not depressed or anxious appearing No increased work of breathing. Pupils are equal. EOM intact without nystagmus No clubbing or cyanosis of the extremities appreciated No significant rashes/lesions/ulcerations overlying the examined area. DP & PT pulses 2+/4.  No significant pretibial edema.  No clubbing or cyanosis Sensation intact to light touch in lower  extremities.  Bilateral feet: Right foot has a small amount of swelling along the medial aspect that is improved from in the past.  Is more confluent.  She has pain with palpation of bilateral plantar fascia that is mild.  No significant fullness appreciated on the left.  Equinus contracture remains on the right at 90 which is improved as well as left which is at 95     No results found. ASSESSMENT & PLAN:   Problem List Items Addressed This Visit    Plantar fascial fibromatosis of both feet - Primary    She does continue to make slow and steady progress.  She has had a 20 pound weight loss which is likely contributed as well as work on her range of motion which has improved.  She should continue with arch strapping, the previously constructed custom orthotics that she is tolerating well.  She wears sandals she should have some with a formed foot bed such as Birkenstocks which she is wearing today.  Overall she is happy with her progress although frustrated with how long it does take but is not interested in any further intervention at this time.  She will continue with her Alfredson exercises and call if she is having any lack of improvement for consideration of injection.      Relevant Orders   US LIMITED JOINT SPACE STRUCTURES LOW BILAT(NO LINKED CHARGES)      Follow-up: Return if symptoms worsen or fail to improve.   CMA/ATC served as Neurosurgeonscribe during this visit. History, Physical, and Plan performed by medical provider. Documentation and orders reviewed and attested to.      Gaspar BiddingMichael Annalia Metzger, DO    [facility]

## 2017-01-26 DIAGNOSIS — Z01419 Encounter for gynecological examination (general) (routine) without abnormal findings: Secondary | ICD-10-CM | POA: Diagnosis not present

## 2017-04-14 ENCOUNTER — Other Ambulatory Visit: Payer: Self-pay | Admitting: Family Medicine

## 2017-04-14 ENCOUNTER — Other Ambulatory Visit: Payer: Self-pay | Admitting: Endocrinology

## 2017-05-15 ENCOUNTER — Encounter: Payer: Self-pay | Admitting: Sports Medicine

## 2017-05-21 DIAGNOSIS — L900 Lichen sclerosus et atrophicus: Secondary | ICD-10-CM | POA: Diagnosis not present

## 2017-05-27 ENCOUNTER — Telehealth: Payer: Self-pay | Admitting: Family Medicine

## 2017-05-27 ENCOUNTER — Ambulatory Visit (INDEPENDENT_AMBULATORY_CARE_PROVIDER_SITE_OTHER): Payer: 59

## 2017-05-27 ENCOUNTER — Encounter: Payer: Self-pay | Admitting: Physician Assistant

## 2017-05-27 ENCOUNTER — Ambulatory Visit (INDEPENDENT_AMBULATORY_CARE_PROVIDER_SITE_OTHER): Payer: 59 | Admitting: Physician Assistant

## 2017-05-27 VITALS — BP 102/62 | HR 85 | Temp 98.7°F | Resp 14 | Ht 66.0 in | Wt 174.0 lb

## 2017-05-27 DIAGNOSIS — E039 Hypothyroidism, unspecified: Secondary | ICD-10-CM

## 2017-05-27 DIAGNOSIS — R1012 Left upper quadrant pain: Secondary | ICD-10-CM | POA: Diagnosis not present

## 2017-05-27 DIAGNOSIS — M25511 Pain in right shoulder: Secondary | ICD-10-CM

## 2017-05-27 DIAGNOSIS — G8929 Other chronic pain: Secondary | ICD-10-CM | POA: Diagnosis not present

## 2017-05-27 LAB — CBC WITH DIFFERENTIAL/PLATELET
Basophils Absolute: 0 10*3/uL (ref 0.0–0.1)
Basophils Relative: 0.3 % (ref 0.0–3.0)
Eosinophils Absolute: 0.1 10*3/uL (ref 0.0–0.7)
Eosinophils Relative: 1.2 % (ref 0.0–5.0)
HCT: 40 % (ref 36.0–46.0)
Hemoglobin: 13 g/dL (ref 12.0–15.0)
Lymphocytes Relative: 11.5 % — ABNORMAL LOW (ref 12.0–46.0)
Lymphs Abs: 1 10*3/uL (ref 0.7–4.0)
MCHC: 32.4 g/dL (ref 30.0–36.0)
MCV: 89.5 fl (ref 78.0–100.0)
Monocytes Absolute: 0.5 10*3/uL (ref 0.1–1.0)
Monocytes Relative: 5.9 % (ref 3.0–12.0)
Neutro Abs: 7.2 10*3/uL (ref 1.4–7.7)
Neutrophils Relative %: 81.1 % — ABNORMAL HIGH (ref 43.0–77.0)
Platelets: 296 10*3/uL (ref 150.0–400.0)
RBC: 4.48 Mil/uL (ref 3.87–5.11)
RDW: 14.9 % (ref 11.5–15.5)
WBC: 8.8 10*3/uL (ref 4.0–10.5)

## 2017-05-27 LAB — TSH: TSH: 2.76 u[IU]/mL (ref 0.35–4.50)

## 2017-05-27 LAB — SEDIMENTATION RATE: Sed Rate: 6 mm/hr (ref 0–20)

## 2017-05-27 MED ORDER — MELOXICAM 15 MG PO TABS: 15.0000 mg | ORAL_TABLET | Freq: Every day | ORAL | 0 refills | Status: DC

## 2017-05-27 NOTE — Progress Notes (Signed)
Pre visit review using our clinic review tool, if applicable. No additional management support is needed unless otherwise documented below in the visit note. 

## 2017-05-27 NOTE — Telephone Encounter (Signed)
Splenic inflammation would usually be reactive to a virus or injury. No splenic enlargement on exam. Again feel this is inflammation in cartilage of lower ribs.

## 2017-05-27 NOTE — Patient Instructions (Signed)
Please go to the lab for blood work. Then stop by the front desk to speak with Tim Lair or Jess to get directions to the [facility] office for x-ray. I will call you with your results.   Start the Meloxicam once daily with food. Tylenol for breakthrough pain. Avoid heavy lifting or overexertion.  We will alter regimen based on x-ray and response to conservative measures.

## 2017-05-27 NOTE — Telephone Encounter (Signed)
Patient calling for clarification on medication she was prescribed, Meloxicam.  Patient wants to know if an anti-inflammatory medication would be also be called in.   I spoke with Selena Batten about patient's question, he advised the Meloxicam is an anti-inflamatory and pain reliever. Patient was notified of this.  Patient states she also forgot to ask during her office visit today what would cause inflammation in the spleen area.  She is requesting a call back.

## 2017-05-27 NOTE — Telephone Encounter (Signed)
Patient notified of PCP recommendations and is agreement and expresses an understanding.  

## 2017-05-27 NOTE — Progress Notes (Signed)
Patient presents to clinic today c/o > 1 month of R shoulder pain that is intermittent, worse in morning, is aching but sometimes sharp, non-radiating, and up to a 10/10 in severity. Patient with history of both OA and fibromyalgia. States this feels different. Denies numbness or tingling. Notes sometimes she cannot move arm without pain. Notes the pain will resolve on its own but reoccurs. Denies known trauma or injury. Is having similar symptoms in L shoulder, just recently starting.  Patient also notes intermittent LUQ with nausea. Denies vomiting. Denies fever, chills, change in stools overall. Occasional loose stool but not consistent. Denies melena, hematochezia or tenesmus.   Is requesting recheck of thyroid levels today. Endorses taking her thyroid medications as directed.   Past Medical History:  Diagnosis Date  . Anxiety   . Arthritis   . Depression   . Fibromyalgia   . Hypothyroidism 1999   post PTU Rx for hyperthyroidism  . IBS (irritable bowel syndrome)     Current Outpatient Prescriptions on File Prior to Visit  Medication Sig Dispense Refill  . ALPRAZolam (XANAX) 0.5 MG tablet Take 1 tablet (0.5 mg total) by mouth 2 (two) times daily as needed for anxiety. 60 tablet 1  . ARIPiprazole (ABILIFY) 5 MG tablet TAKE 1 TABLET (5 MG TOTAL) BY MOUTH DAILY. 90 tablet 1  . busPIRone (BUSPAR) 15 MG tablet TAKE 1 TABLET BY MOUTH 2 (TWO) TIMES DAILY. 180 tablet 0  . cyclobenzaprine (FLEXERIL) 10 MG tablet Take 1 tablet (10 mg total) by mouth 3 (three) times daily as needed for muscle spasms. 45 tablet 0  . levothyroxine (SYNTHROID, LEVOTHROID) 150 MCG tablet TAKE 1 TABLET (150 MCG TOTAL) BY MOUTH DAILY. 90 tablet 1  . meloxicam (MOBIC) 15 MG tablet TAKE 1/2 TO 1 TABLET BY MOUTH ONCE DAILY AS NEEDED FOR PAIN 30 tablet 2  . metFORMIN (GLUCOPHAGE-XR) 500 MG 24 hr tablet TAKE 1 TABLET EVERY MORNING WITH BREAKFAST 90 tablet 2  . sertraline (ZOLOFT) 100 MG tablet TAKE ONE TABLET BY MOUTH ONE  TIME DAILY 90 tablet 1  . SUMAtriptan (IMITREX) 50 MG tablet Take 1 tablet (50 mg total) by mouth every 2 (two) hours as needed for migraine. May repeat in 2 hours if headache persists or recurs. 10 tablet 6   No current facility-administered medications on file prior to visit.     Allergies  Allergen Reactions  . Methimazole Other (See Comments)    Body goes into arthritic shock    Family History  Problem Relation Age of Onset  . Diabetes Paternal Grandmother   . Hyperlipidemia Mother   . Diabetes Father   . Cancer Paternal Grandfather        lung  . ADD / ADHD Son   . Other Neg Hx        PCO    Social History   Social History  . Marital status: Married    Spouse name: N/A  . Number of children: N/A  . 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 Smoking status: Never Smoker  . Smokeless tobacco: Never Used  . Alcohol use Yes  . Drug use: No  . Sexual activity: Not Asked   Other Topics Concern  . None   Social History Narrative  . None   Review of Systems - See HPI.  All other ROS are negative.  BP 102/62   Pulse 85   Temp  98.7 F (37.1 C) (Oral)   Resp 14   Ht  (1.676 m)   Wt 174 lb (78.9 kg)   SpO2 98%   BMI 28.08 kg/m   Physical Exam  Constitutional: She is well-developed, well-nourished, and in no distress.  HENT:  Head: Normocephalic and atraumatic.  Eyes: Conjunctivae are normal.  Neck: Neck supple. No thyromegaly present.  Cardiovascular: Normal rate, regular rhythm, normal heart sounds and intact distal pulses.   Pulmonary/Chest: Effort normal and breath sounds normal. No respiratory distress. She has no wheezes. She has no rales. She exhibits no tenderness.  Abdominal: Soft. Bowel sounds are normal. She exhibits no distension and no mass. There is generalized tenderness. There is no rebound and no guarding.  Musculoskeletal:       Left shoulder: She exhibits tenderness  and pain. She exhibits normal range of motion, no bony tenderness, normal pulse and normal strength.  Pain with internal rotation of L shoulder. No pain with abduction, adduction or external rotation. Some pain noted with flexion. None with extension. Passive ROM within normal limits. Active ROM within normal limits. No tenderness of biceps tendon on exam.  Lymphadenopathy:    She has no cervical adenopathy.  Vitals reviewed.  Assessment/Plan: 1. Chronic right shoulder pain X-ray today. Start Mobic. Stretches discussed. If not improving, will have her follow-up with Dr. Berline Chough for assessment and MSK [place]. - DG Shoulder Right; Future  2. Chronic LUQ pain None at present. Mild generalized tenderness on examination. Labs today. PPI as directed. Supportive measures reviewed. Limit alcohol consumption. - CBC w/Diff - Sedimentation rate - TSH  3. Hypothyroidism, unspecified type Repeat labs today.   Piedad Climes, PA-C

## 2017-05-29 ENCOUNTER — Other Ambulatory Visit: Payer: Self-pay | Admitting: Physician Assistant

## 2017-05-29 DIAGNOSIS — R1012 Left upper quadrant pain: Secondary | ICD-10-CM

## 2017-06-04 ENCOUNTER — Other Ambulatory Visit (INDEPENDENT_AMBULATORY_CARE_PROVIDER_SITE_OTHER): Payer: 59

## 2017-06-04 DIAGNOSIS — R1012 Left upper quadrant pain: Secondary | ICD-10-CM | POA: Diagnosis not present

## 2017-06-04 LAB — CBC WITH DIFFERENTIAL/PLATELET
Basophils Absolute: 0 10*3/uL (ref 0.0–0.1)
Basophils Relative: 0.6 % (ref 0.0–3.0)
Eosinophils Absolute: 0.2 10*3/uL (ref 0.0–0.7)
Eosinophils Relative: 3.2 % (ref 0.0–5.0)
HCT: 38 % (ref 36.0–46.0)
Hemoglobin: 12.3 g/dL (ref 12.0–15.0)
Lymphocytes Relative: 19.1 % (ref 12.0–46.0)
Lymphs Abs: 1.3 10*3/uL (ref 0.7–4.0)
MCHC: 32.4 g/dL (ref 30.0–36.0)
MCV: 89.2 fl (ref 78.0–100.0)
Monocytes Absolute: 0.6 10*3/uL (ref 0.1–1.0)
Monocytes Relative: 8.6 % (ref 3.0–12.0)
Neutro Abs: 4.7 10*3/uL (ref 1.4–7.7)
Neutrophils Relative %: 68.5 % (ref 43.0–77.0)
Platelets: 299 10*3/uL (ref 150.0–400.0)
RBC: 4.26 Mil/uL (ref 3.87–5.11)
RDW: 14.5 % (ref 11.5–15.5)
WBC: 6.8 10*3/uL (ref 4.0–10.5)

## 2017-06-04 LAB — H. PYLORI ANTIBODY, IGG: H Pylori IgG: NEGATIVE

## 2017-06-19 ENCOUNTER — Ambulatory Visit (INDEPENDENT_AMBULATORY_CARE_PROVIDER_SITE_OTHER): Payer: 59 | Admitting: Internal Medicine

## 2017-06-19 ENCOUNTER — Ambulatory Visit (HOSPITAL_BASED_OUTPATIENT_CLINIC_OR_DEPARTMENT_OTHER)
Admission: RE | Admit: 2017-06-19 | Discharge: 2017-06-19 | Disposition: A | Discharge status: Home / Self Care | Payer: 59 | Source: Ambulatory Visit | Attending: Internal Medicine | Admitting: Internal Medicine

## 2017-06-19 ENCOUNTER — Encounter: Payer: Self-pay | Admitting: Internal Medicine

## 2017-06-19 ENCOUNTER — Telehealth: Payer: Self-pay | Admitting: Family Medicine

## 2017-06-19 VITALS — BP 106/70 | HR 88 | Temp 98.0°F | Resp 14 | Ht 66.0 in | Wt 179.4 lb

## 2017-06-19 DIAGNOSIS — K589 Irritable bowel syndrome without diarrhea: Secondary | ICD-10-CM | POA: Diagnosis not present

## 2017-06-19 DIAGNOSIS — R079 Chest pain, unspecified: Secondary | ICD-10-CM

## 2017-06-19 DIAGNOSIS — R1012 Left upper quadrant pain: Secondary | ICD-10-CM

## 2017-06-19 NOTE — Telephone Encounter (Signed)
Just FYI - [facility]

## 2017-06-19 NOTE — Patient Instructions (Signed)
GO TO THE LAB : provide a urine sample   STOP BY THE FIRST FLOOR:  get the XR    IBUPROFEN (Advil or Motrin) 200 mg 2 tablets every 6 hours as needed for pain.  Always take it with food because may cause gastritis and ulcers.  If you notice nausea, stomach pain, change in the color of stools --->  Stop the medicine and let us know  We will schedule an abdominal ultrasound as well  Please see your primary doctor in 2 weeks if not  improving

## 2017-06-19 NOTE — Progress Notes (Signed)
Pre visit review using our clinic review tool, if applicable. No additional management support is needed unless otherwise documented below in the visit note. 

## 2017-06-19 NOTE — Telephone Encounter (Signed)
Patient Name: Crystal ShadowSHELLEY Belinsky DOB: June 21, 1976 Initial Comment Caller is having severe abdominal pain. Pain is 7/8-10. Nurse Assessment Nurse: Milana NaGilmartin, RN, Donnamarie PoagJeanne Date/Time (Eastern Time): 06/19/2017 12:37:43 PM Confirm and document reason for call. If symptomatic, describe symptoms. ---The patient states she is having pain LUQ under rib cage. She states she has a lump the size of a golf ball. She states the pain is radiating to LLQ. Denies fever, nausea or vomiting. States she had diarrhea intermittently for a week about 3 x / day. Took immodium yesterday. She states she was seen 1 month ago and was told it was swollen cartilage. Pain is between 5-7 constant today pain increases as day progresses. Does the patient have any new or worsening symptoms? ---Yes Will a triage be completed? ---Yes Related visit to physician within the last 2 weeks? ---No Does the PT have any chronic conditions? (i.e. diabetes, asthma, etc.) ---Yes List chronic conditions. ---Prediabetic on Metformin, Hypothyroid and fibromyalgia. Is the patient pregnant or possibly pregnant? (Ask all females between the ages of 712-55) ---No Is this a behavioral health or substance abuse call? ---No Guidelines Guideline Title Affirmed Question Affirmed Notes Abdominal Pain - Upper [1] MILD-MODERATE pain AND [2] constant AND [3] present > 2 hours Final Disposition User See Physician within 4 Hours (or PCP triage) Milana NaGilmartin, RN, Donnamarie PoagJeanne Comments attempted to reach the caller to schedule appt. Left voicemail. Spoke with caller. No appt available at primary office. [facility] 4pm [facility] Referrals REFERRED TO PCP OFFICE Caller Disagree/Comply Comply Caller Understands Yes PreDisposition Call Doctor

## 2017-06-19 NOTE — Progress Notes (Signed)
Subjective:    Patient ID: [name], female    DOB: 1976/06/08, 41 y.o.   MRN: [id number]  DOS:  06/19/2017 Type of visit - description : acute Interval history: 4 weeks history of left upper quadrant abdominal pain/chest pain. Several episodes a day, last few seconds, radiates down to the abdomen sometimes, chest wall  is TTP and feels a lump on self exam. Was seen by Daphine Deutscher, PA-C 05-27-17 d/t chronic LUQ pain:  sed rate, TSH were normal. Eating does not seem to trigger the pain, bending her torso possibly triggers it. No injury, no rash   Review of Systems  No fever or chills Has a history of IBS, occasional diarrhea occasional constipation.  occ sees RBPR w/ BMs, she thinks related to hemorrhoids No cough Have some mid to low back pain No dysuria or gross hematuria  Past Medical History:  Diagnosis Date  . Anxiety   . Arthritis   . Depression   . Fibromyalgia   . Hypothyroidism 1999   post PTU Rx for hyperthyroidism  . IBS (irritable bowel syndrome)     Past Surgical History:  Procedure Laterality Date  . CESAREAN SECTION     x 2    Social History   Social History  . Marital status: Married    Spouse name: N/A  . Number of children: N/A  . 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 Smoking status: Never Smoker  . Smokeless tobacco: Never Used  . Alcohol use Yes  . Drug use: No  . Sexual activity: Not on file   Other Topics Concern  . Not on file   Social History Narrative  . No narrative on file      Allergies as of 06/19/2017      Reactions   Methimazole Other (See Comments)   Body goes into arthritic shock      Medication List       Accurate as of 06/19/17 11:59 PM. Always use your most recent med list.          ALPRAZolam 0.5 MG tablet Commonly known as:  XANAX Take 1 tablet (0.5 mg total) by mouth 2 (two) times daily as needed for anxiety.   ARIPiprazole 5 MG  tablet Commonly known as:  ABILIFY TAKE 1 TABLET (5 MG TOTAL) BY MOUTH DAILY.   busPIRone 15 MG tablet Commonly known as:  BUSPAR TAKE 1 TABLET BY MOUTH 2 (TWO) TIMES DAILY.   cyclobenzaprine 10 MG tablet Commonly known as:  FLEXERIL Take 1 tablet (10 mg total) by mouth 3 (three) times daily as needed for muscle spasms.   levothyroxine 150 MCG tablet Commonly known as:  SYNTHROID, LEVOTHROID TAKE 1 TABLET (150 MCG TOTAL) BY MOUTH DAILY.   meloxicam 15 MG tablet Commonly known as:  MOBIC Take 1 tablet (15 mg total) by mouth daily.   metFORMIN 500 MG 24 hr tablet Commonly known as:  GLUCOPHAGE-XR TAKE 1 TABLET EVERY MORNING WITH BREAKFAST   sertraline 100 MG tablet Commonly known as:  ZOLOFT TAKE ONE TABLET BY MOUTH ONE TIME DAILY   SUMAtriptan 50 MG tablet Commonly known as:  IMITREX Take 1 tablet (50 mg total) by mouth every 2 (two) hours as needed for migraine. May repeat in 2 hours if headache persists or recurs.          Objective:   Physical Exam  Abdominal:     BP  106/70 (BP Location: Right Arm, Patient Position: Sitting, Cuff Size: Small)   Pulse 88   Temp 98 F (36.7 C) (Oral)   Resp 14   Ht 5\' 6"  (1.676 m)   Wt 179 lb 6 oz (81.4 kg)   LMP 06/15/2017 (Exact Date)   SpO2 97%   BMI 28.95 kg/m  General:   Well developed, well nourished . NAD.  HEENT:  Normocephalic . Face symmetric, atraumatic Neck: no LAD Lungs:  CTA B Normal respiratory effort, no intercostal retractions, no accessory muscle use. Heart: RRR,  no murmur.  no pretibial edema bilaterally  Abdomen:  Not distended, soft, non-tender. No rebound or rigidity.   Skin: Not pale. Not jaundice Neurologic:  alert & oriented X3.  Speech normal, gait appropriate for age and unassisted Psych--  Cognition and judgment appear intact.  Cooperative with normal attention span and concentration.  Behavior appropriate. No anxious or depressed appearing.     Assessment & Plan:   41 year old  lady with history of anxiety, fibromyalgia, hypothyroidism, IBS, PCO, contraception: husband vasectomy, LMP 4 days ago, presents w/:  Chest pain:  Pain and tenderness at L chest wall, no injury, some radiation to the left abdomen; clinical diagnosis is chondritis, recent CBC and sed rate normal.  We will get a rib series x-rays, treat with ibuprofen, GI precautions discussed.  If not better, she will call her PCP in 2 weeks for further eval. Left-sided abdominal pain: Patient is quite concerned about it, the primary problem seemed to be chondritis but she does have some radiation of the pain to the left abdomen.  We will get a UA, urine culture, given patient concerns with also get ultrasound. IBS:  she asked me how to treat them, recommend probiotics, gradual introduction of fiber supplements.  Multiple questions answered to the best of my ability, F2F > 25 minutes

## 2017-06-20 LAB — URINALYSIS, ROUTINE W REFLEX MICROSCOPIC
Bilirubin Urine: NEGATIVE
Glucose, UA: NEGATIVE
Hgb urine dipstick: NEGATIVE
Ketones, ur: NEGATIVE
Leukocytes, UA: NEGATIVE
Nitrite: NEGATIVE
Protein, ur: NEGATIVE
Specific Gravity, Urine: 1.011 (ref 1.001–1.03)
pH: 6 (ref 5.0–8.0)

## 2017-06-21 ENCOUNTER — Ambulatory Visit (HOSPITAL_BASED_OUTPATIENT_CLINIC_OR_DEPARTMENT_OTHER)
Admission: RE | Admit: 2017-06-21 | Discharge: 2017-06-21 | Disposition: A | Discharge status: Home / Self Care | Payer: 59 | Source: Ambulatory Visit | Attending: Internal Medicine | Admitting: Internal Medicine

## 2017-06-21 DIAGNOSIS — R1012 Left upper quadrant pain: Secondary | ICD-10-CM | POA: Insufficient documentation

## 2017-06-21 LAB — URINE CULTURE
MICRO NUMBER:: 81202560
SPECIMEN QUALITY:: ADEQUATE

## 2017-06-22 ENCOUNTER — Ambulatory Visit: Payer: 59 | Admitting: Family Medicine

## 2017-06-22 DIAGNOSIS — Z0289 Encounter for other administrative examinations: Secondary | ICD-10-CM

## 2017-07-20 ENCOUNTER — Encounter: Payer: Self-pay | Admitting: General Practice

## 2017-07-20 ENCOUNTER — Other Ambulatory Visit: Payer: Self-pay | Admitting: Family Medicine

## 2017-07-20 NOTE — Telephone Encounter (Signed)
Ok for #30 of each, no refills.  Will need at the very least f/u appt but also needs a CPE

## 2017-07-20 NOTE — Telephone Encounter (Signed)
Pt will need a thyroid follow up.    [facility] / PCP recommendations / Schedule patient.

## 2017-07-20 NOTE — Telephone Encounter (Signed)
Please advise last CPE 05/2016, pt saw you in February 2018 for plantar fasciitis

## 2017-07-23 ENCOUNTER — Ambulatory Visit: Payer: 59 | Admitting: Family Medicine

## 2017-07-23 ENCOUNTER — Encounter: Payer: Self-pay | Admitting: Family Medicine

## 2017-07-23 ENCOUNTER — Other Ambulatory Visit: Payer: Self-pay

## 2017-07-23 VITALS — BP 110/80 | HR 92 | Temp 98.1°F | Resp 16 | Ht 66.0 in | Wt 182.5 lb

## 2017-07-23 DIAGNOSIS — E282 Polycystic ovarian syndrome: Secondary | ICD-10-CM | POA: Diagnosis not present

## 2017-07-23 DIAGNOSIS — F411 Generalized anxiety disorder: Secondary | ICD-10-CM

## 2017-07-23 LAB — HEMOGLOBIN A1C: Hgb A1c MFr Bld: 6 % (ref 4.6–6.5)

## 2017-07-23 MED ORDER — ALPRAZOLAM 0.5 MG PO TABS: 0.5000 mg | ORAL_TABLET | Freq: Two times a day (BID) | ORAL | 1 refills | Status: DC | PRN

## 2017-07-23 MED ORDER — SERTRALINE HCL 100 MG PO TABS: 150.0000 mg | ORAL_TABLET | Freq: Every day | ORAL | 1 refills | Status: DC

## 2017-07-23 NOTE — Patient Instructions (Signed)
Follow up in 4-6 weeks to recheck anxiety We'll notify you of your lab results and make any changes if needed INCREASE the Sertraline (Zoloft) to 150mg  daily (1.5 tabs) DECREASE the Buspar to 1/2 tab twice daily x1 month and then stop Continue to work on healthy diet and regular exercise- you can do it! Use the Alprazolam as needed Call with any questions or concerns Happy Holidays!

## 2017-07-23 NOTE — Progress Notes (Signed)
   Subjective:    Patient ID: [name], female    DOB: [birth year], 41 y.o.   MRN: [id number]  HPI Mood- pt is on Buspar but this is on long term back order.  Pt doesn't feel that this is effective for her as her anxiety is worsening.  Also on Zoloft and Alprazolam prn.  Pt is feeling anxious '4-5 days per week'.    PCOS- pt was previously on Metformin per Endo but she weaned herself off this and wants to make sure that was ok.   Review of Systems For ROS see HPI     Objective:   Physical Exam  Constitutional: She is oriented to person, place, and time. She appears well-developed and well-nourished. No distress.  HENT:  Head: Normocephalic and atraumatic.  Eyes: Conjunctivae and EOM are normal. Pupils are equal, round, and reactive to light.  Cardiovascular: Normal rate, regular rhythm and normal heart sounds.  Pulmonary/Chest: Effort normal and breath sounds normal. No respiratory distress. She has no wheezes. She has no rales.  Neurological: She is alert and oriented to person, place, and time.  Skin: Skin is warm and dry.  Psychiatric: She has a normal mood and affect. Her behavior is normal. Thought content normal.  Vitals reviewed.         Assessment & Plan:

## 2017-07-24 ENCOUNTER — Encounter: Payer: Self-pay | Admitting: General Practice

## 2017-07-24 LAB — BASIC METABOLIC PANEL
BUN: 12 mg/dL (ref 6–23)
CO2: 29 mEq/L (ref 19–32)
Calcium: 9.2 mg/dL (ref 8.4–10.5)
Chloride: 102 mEq/L (ref 96–112)
Creatinine, Ser: 0.78 mg/dL (ref 0.40–1.20)
GFR: 86 mL/min (ref >60.00)
Glucose, Bld: 146 mg/dL — ABNORMAL HIGH (ref 70–99)
Potassium: 3.8 mEq/L (ref 3.5–5.1)
Sodium: 139 mEq/L (ref 135–145)

## 2017-07-27 NOTE — Assessment & Plan Note (Signed)
Deteriorated.  Pt doesn't feel that her anxiety is controlled as she is having breakthrough sxs 4-5x/week.  Since Buspar is on back order and she is not able to get it, we will attempt to wean this while increasing the Zoloft to 150mg  daily.  Encouraged counseling.  Will follow closely.

## 2017-07-27 NOTE — Assessment & Plan Note (Signed)
Ongoing issue for pt.  She was previously seeing Endo and on Metformin but she stopped seeing Endo and weaned herself off her Metformin.  She wants to check BMP and A1C to make sure she doesn't need to restart the Metformin. Stressed need for healthy diet and regular exercise.  Will follow closely.

## 2017-08-21 ENCOUNTER — Other Ambulatory Visit: Payer: Self-pay | Admitting: Family Medicine

## 2017-08-31 ENCOUNTER — Other Ambulatory Visit: Payer: Self-pay | Admitting: General Practice

## 2017-08-31 MED ORDER — LEVOTHYROXINE SODIUM 150 MCG PO TABS: 150.0000 ug | ORAL_TABLET | Freq: Every day | ORAL | 0 refills | Status: DC

## 2017-09-03 ENCOUNTER — Ambulatory Visit: Payer: 59 | Admitting: Family Medicine

## 2017-09-28 ENCOUNTER — Ambulatory Visit: Payer: 59 | Admitting: Family Medicine

## 2017-09-28 DIAGNOSIS — Z0289 Encounter for other administrative examinations: Secondary | ICD-10-CM

## 2017-10-02 ENCOUNTER — Other Ambulatory Visit: Payer: Self-pay

## 2017-10-02 ENCOUNTER — Encounter: Payer: Self-pay | Admitting: Family Medicine

## 2017-10-02 ENCOUNTER — Ambulatory Visit: Payer: 59 | Admitting: Family Medicine

## 2017-10-02 VITALS — BP 98/68 | HR 98 | Temp 98.7°F | Ht 66.0 in | Wt 181.2 lb

## 2017-10-02 DIAGNOSIS — A09 Infectious gastroenteritis and colitis, unspecified: Secondary | ICD-10-CM | POA: Diagnosis not present

## 2017-10-02 DIAGNOSIS — K589 Irritable bowel syndrome without diarrhea: Secondary | ICD-10-CM

## 2017-10-02 MED ORDER — ONDANSETRON HCL 4 MG PO TABS: 4.0000 mg | ORAL_TABLET | Freq: Three times a day (TID) | ORAL | 0 refills | Status: DC | PRN

## 2017-10-02 NOTE — Patient Instructions (Signed)
Please follow up if symptoms do not improve or as needed.    Viral Gastroenteritis, Adult Viral gastroenteritis is also known as the stomach flu. This condition is caused by certain germs (viruses). These germs can be passed from person to person very easily (are very contagious). This condition can cause sudden watery poop (diarrhea), fever, and throwing up (vomiting). Having watery poop and throwing up can make you feel weak and cause you to get dehydrated. Dehydration can make you tired and thirsty, make you have a dry mouth, and make it so you pee (urinate) less often. Older adults and people with other diseases or a weak defense system (immune system) are at higher risk for dehydration. It is important to replace the fluids that you lose from having watery poop and throwing up. Follow these instructions at home: Follow instructions from your doctor about how to care for yourself at home. Eating and drinking  Follow these instructions as told by your doctor:  Take an oral rehydration solution (ORS). This is a drink that is sold at pharmacies and stores.  Drink clear fluids in small amounts as you are able, such as: ? Water. ? Ice chips. ? Diluted fruit juice. ? Low-calorie sports drinks.  Eat bland, easy-to-digest foods in small amounts as you are able, such as: ? Bananas. ? Applesauce. ? Rice. ? Low-fat (lean) meats. ? Toast. ? Crackers.  Avoid fluids that have a lot of sugar or caffeine in them.  Avoid alcohol.  Avoid spicy or fatty foods.  General instructions  Drink enough fluid to keep your pee (urine) clear or pale yellow.  Wash your hands often. If you cannot use soap and water, use hand sanitizer.  Make sure that all people in your home wash their hands well and often.  Rest at home while you get better.  Take over-the-counter and prescription medicines only as told by your doctor.  Watch your condition for any changes.  Take a warm bath to help with any  burning or pain from having watery poop.  Keep all follow-up visits as told by your doctor. This is important. Contact a doctor if:  You cannot keep fluids down.  Your symptoms get worse.  You have new symptoms.  You feel light-headed or dizzy.  You have muscle cramps. Get help right away if:  You have chest pain.  You feel very weak or you pass out (faint).  You see blood in your throw-up.  Your throw-up looks like coffee grounds.  You have bloody or black poop (stools) or poop that look like tar.  You have a very bad headache, a stiff neck, or both.  You have a rash.  You have very bad pain, cramping, or bloating in your belly (abdomen).  You have trouble breathing.  You are breathing very quickly.  Your heart is beating very quickly.  Your skin feels cold and clammy.  You feel confused.  You have pain when you pee.  You have signs of dehydration, such as: ? Dark pee, hardly any pee, or no pee. ? Cracked lips. ? Dry mouth. ? Sunken eyes. ? Sleepiness. ? Weakness. This information is not intended to replace advice given to you by your health care provider. Make sure you discuss any questions you have with your health care provider. Document Released: 01/28/2008 Document Revised: 02/29/2016 Document Reviewed: 04/17/2015 Elsevier Interactive Patient Education  2017 Elsevier Inc.   

## 2017-10-02 NOTE — Progress Notes (Signed)
Subjective:      Subjective  CC:  Chief Complaint  Patient presents with  . Nausea    X 6 days     HPI: [name] is a 42 y.o. female who presents for evaluation of diarrhea several times per day and nausea. Symptoms have been present for 5 days. Patient denies blood in stool, dysuria, fever, hematemesis, melena and mucoid stool. Patient's oral intake has been decreased for liquids and decreased for solids. Patient's urine output has been adequate. No contacts with similar symptoms. Patient denies recent travel history. Patient has not had recent ingestion of possible contaminated food, toxic plants, or inappropriate medications/poisons. No recent use of antibiotics. She denies orthostatic symptoms. Diarrhea is watery. Also with ibs and abdominal cramping. immodium helped diarrhea. No stool yet today. No significant lightheadedness.  I reviewed the patients updated PMH, FH, and SocHx.    Patient Active Problem List   Diagnosis Date Noted  . 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 Obesity (BMI 30.0-34.9) 09/30/2016  . Dry skin dermatitis 10/15/2015  . Folliculitis 10/15/2015  . Poor concentration 10/15/2015  . Headache 06/20/2015  . PCO (polycystic ovaries) 03/02/2015  . Hair loss 01/31/2015  . Acne 01/31/2015  . Allergic rhinitis 01/05/2015  . High risk medications (not anticoagulants) long-term use 10/26/2013  . Nausea alone 12/30/2012  . General medical examination 05/20/2011  . Foot pain, right 07/15/2010  . KNEE PAIN, LEFT 05/17/2010  . Anxiety state 04/19/2008  . B12 DEFICIENCY 03/17/2008  . FATIGUE 02/14/2008  . Hypothyroidism 07/26/2007  . Fibromyalgia 07/26/2007  . IRRITABLE BOWEL SYNDROME, HX OF 07/26/2007   Current Meds  Medication Sig  . levothyroxine (SYNTHROID, LEVOTHROID) 150 MCG tablet Take 1 tablet (150 mcg total) by mouth daily.  . sertraline (ZOLOFT) 100 MG tablet Take 1.5 tablets (150 mg total) by mouth daily.  . SUMAtriptan (IMITREX)  50 MG tablet Take 1 tablet (50 mg total) by mouth every 2 (two) hours as needed for migraine. May repeat in 2 hours if headache persists or recurs.    Allergies: Patient is allergic to methimazole. Family History: Patient family history includes ADD / ADHD in her son; Cancer in her paternal grandfather; Diabetes in her father and paternal grandmother; Hyperlipidemia in her mother. Social History:  Patient  reports that  has never smoked. she has never used smokeless tobacco. She reports that she drinks alcohol. She reports that she does not use drugs.  Review of Systems: Constitutional: Negative for weight loss  Cardiovascular: negative for chest pain or palpitations Respiratory: negative for SOB or persistent cough Gastrointestinal: no severe pain, + cramping  Objective  Vitals: BP 98/68   Pulse 98   Temp 98.7 F (37.1 C)   Ht 5\' 6"  (1.676 m)   Wt 181 lb 3.2 oz (82.2 kg)   BMI 29.25 kg/m  General: no acute distress , A&Ox3 HEENT: PEERL, conjunctiva normal, Oropharynx dry mmm, neck is supple Cardiovascular:  RRR without murmur or gallop.  Respiratory:  Good breath sounds bilaterally, CTAB with normal respiratory effort Gastrointestinal: soft, quiet bowel sounds, minimally tender w/o masses rebound or guarding.  Skin:  Warm, dry, no rashes  Assessment  1. Gastroenteritis, infectious   2. Irritable bowel syndrome, unspecified type      Plan   Gastroenteritis:  Likely viral etiology given above hx and physical findings. Educated on prognosis and typical course of illness. Need to avoid dehydration. Ordered medications to assist in that if needed and discussed pushing PO  fluids. See AVS for GE information.   Push fluids. Clear liquid diet with increased fluids intake advancing ot BRATS diet and then regular as tolerated. Avoid dair products and caffeine until resolved.   Follow up: Return in 5-7 days if not resolved. Sooner if worsening.    Commons side effects, risks,  benefits, and alternatives for medications and treatment plan prescribed today were discussed, and the patient expressed understanding of the given instructions. Patient is instructed to call or message via MyChart if he/she has any questions or concerns regarding our treatment plan. No barriers to understanding were identified. We discussed Red Flag symptoms and signs in detail. Patient expressed understanding regarding what to do in case of urgent or emergency type symptoms.   Medication list was reconciled, printed and provided to the patient in AVS. Patient instructions and summary information was reviewed with the patient as documented in the AVS. This note was prepared with assistance of Dragon voice recognition software. Occasional wrong-word or sound-a-like substitutions may have occurred due to the inherent limitations of voice recognition software  No orders of the defined types were placed in this encounter.  Meds ordered this encounter  Medications  . ondansetron (ZOFRAN) 4 MG tablet    Sig: Take 1 tablet (4 mg total) by mouth every 8 (eight) hours as needed for nausea or vomiting.    Dispense:  20 tablet    Refill:  0

## 2017-10-19 ENCOUNTER — Other Ambulatory Visit: Payer: Self-pay | Admitting: Family Medicine

## 2017-11-22 ENCOUNTER — Other Ambulatory Visit: Payer: Self-pay | Admitting: Family Medicine

## 2018-01-18 ENCOUNTER — Other Ambulatory Visit: Payer: Self-pay | Admitting: Family Medicine

## 2018-01-22 ENCOUNTER — Encounter: Payer: Self-pay | Admitting: Family Medicine

## 2018-01-22 ENCOUNTER — Other Ambulatory Visit: Payer: Self-pay

## 2018-01-22 ENCOUNTER — Ambulatory Visit: Payer: 59 | Admitting: Family Medicine

## 2018-01-22 VITALS — BP 122/80 | HR 96 | Temp 98.6°F | Resp 16 | Ht 66.0 in | Wt 163.4 lb

## 2018-01-22 DIAGNOSIS — F411 Generalized anxiety disorder: Secondary | ICD-10-CM

## 2018-01-22 DIAGNOSIS — E669 Obesity, unspecified: Secondary | ICD-10-CM

## 2018-01-22 DIAGNOSIS — E039 Hypothyroidism, unspecified: Secondary | ICD-10-CM | POA: Diagnosis not present

## 2018-01-22 NOTE — Assessment & Plan Note (Signed)
Improved.  Feels sxs are well controlled.  No med changes at this time.  Will follow.

## 2018-01-22 NOTE — Progress Notes (Signed)
   Subjective:    Patient ID: [name], female    DOB: 08/19/1976, 42 y.o.   MRN: [id number]  HPI Obesity- pt has dropped nearly 20 lbs since February.  Has switched to Keto diet.  Feels good.  Pt plans to resume yoga and then bridge into exercise.  Hypothyroid- chronic problem, on Levothyroxine .  Denies fatigue, changes to skin/hair/nails.  Anxiety- better controlled recently.  Rarely using Xanax- only 1 since Feb.  On Abilify  and Sertraline  daily.  Review of Systems For ROS see HPI     Objective:   Physical Exam  Constitutional: She is oriented to person, place, and time. She appears well-developed and well-nourished. No distress.  HENT:  Head: Normocephalic and atraumatic.  Eyes: Pupils are equal, round, and reactive to light. Conjunctivae and EOM are normal.  Neck: Normal range of motion. Neck supple. No thyromegaly present.  Cardiovascular: Normal rate, regular rhythm, normal heart sounds and intact distal pulses.  No murmur heard. Pulmonary/Chest: Effort normal and breath sounds normal. No respiratory distress.  Abdominal: Soft. She exhibits no distension. There is no tenderness.  Musculoskeletal: She exhibits no edema.  Lymphadenopathy:    She has no cervical adenopathy.  Neurological: She is alert and oriented to person, place, and time.  Skin: Skin is warm and dry.  Psychiatric: She has a normal mood and affect. Her behavior is normal.  Vitals reviewed.         Assessment & Plan:

## 2018-01-22 NOTE — Patient Instructions (Signed)
Schedule your complete physical in 6 months We'll notify you of your lab results and make any changes if needed Continue to work on healthy diet and regular exercise- you're doing great!!! Call with any questions or concerns Have a great summer!!! 

## 2018-01-22 NOTE — Assessment & Plan Note (Signed)
Chronic problem.  Currently asymptomatic.  Check labs.  Adjust meds prn  

## 2018-01-22 NOTE — Assessment & Plan Note (Signed)
Much improved!  BMI is now down to 26.4 after a 20 lb weight loss.  Applauded her efforts.  Check labs to risk stratify.  Will continue to follow.

## 2018-01-26 ENCOUNTER — Encounter: Payer: Self-pay | Admitting: General Practice

## 2018-01-26 LAB — LIPID PANEL
Cholesterol: 196 mg/dL (ref <200)
HDL: 65 mg/dL (ref >50)
LDL Cholesterol (Calc): 113 mg/dL (calc) — ABNORMAL HIGH
Non-HDL Cholesterol (Calc): 131 mg/dL (calc) — ABNORMAL HIGH (ref <130)
Total CHOL/HDL Ratio: 3 (calc) (ref <5.0)
Triglycerides: 81 mg/dL (ref <150)

## 2018-01-26 LAB — BASIC METABOLIC PANEL
BUN: 15 mg/dL (ref 7–25)
CO2: 26 mmol/L (ref 20–32)
Calcium: 9.4 mg/dL (ref 8.6–10.2)
Chloride: 101 mmol/L (ref 98–110)
Creat: 0.75 mg/dL (ref 0.50–1.10)
Glucose, Bld: 132 mg/dL — ABNORMAL HIGH (ref 65–99)
Potassium: 3.8 mmol/L (ref 3.5–5.3)
Sodium: 136 mmol/L (ref 135–146)

## 2018-01-26 LAB — HEPATIC FUNCTION PANEL
AG Ratio: 1.7 (calc) (ref 1.0–2.5)
ALT: 12 U/L (ref 6–29)
AST: 21 U/L (ref 10–30)
Albumin: 4.5 g/dL (ref 3.6–5.1)
Alkaline phosphatase (APISO): 65 U/L (ref 33–115)
Bilirubin, Direct: 0.1 mg/dL (ref 0.0–0.2)
Globulin: 2.7 g/dL (calc) (ref 1.9–3.7)
Indirect Bilirubin: 0.3 mg/dL (calc) (ref 0.2–1.2)
Total Bilirubin: 0.4 mg/dL (ref 0.2–1.2)
Total Protein: 7.2 g/dL (ref 6.1–8.1)

## 2018-01-26 LAB — TEST AUTHORIZATION

## 2018-01-26 LAB — CBC WITH DIFFERENTIAL/PLATELET
Basophils Absolute: 49 cells/uL (ref 0–200)
Basophils Relative: 0.7 %
Eosinophils Absolute: 98 cells/uL (ref 15–500)
Eosinophils Relative: 1.4 %
HCT: 40.3 % (ref 35.0–45.0)
Hemoglobin: 13.3 g/dL (ref 11.7–15.5)
Lymphs Abs: 1106 cells/uL (ref 850–3900)
MCH: 29 pg (ref 27.0–33.0)
MCHC: 33 g/dL (ref 32.0–36.0)
MCV: 88 fL (ref 80.0–100.0)
MPV: 9.6 fL (ref 7.5–12.5)
Monocytes Relative: 6.5 %
Neutro Abs: 5292 cells/uL (ref 1500–7800)
Neutrophils Relative %: 75.6 %
Platelets: 304 10*3/uL (ref 140–400)
RBC: 4.58 10*6/uL (ref 3.80–5.10)
RDW: 14 % (ref 11.0–15.0)
Total Lymphocyte: 15.8 %
WBC mixed population: 455 cells/uL (ref 200–950)
WBC: 7 10*3/uL (ref 3.8–10.8)

## 2018-01-26 LAB — HEMOGLOBIN A1C W/OUT EAG: Hgb A1c MFr Bld: 5.3 % of total Hgb (ref <5.7)

## 2018-01-26 LAB — TSH: TSH: 2.08 mIU/L

## 2018-02-18 ENCOUNTER — Other Ambulatory Visit: Payer: Self-pay | Admitting: Family Medicine

## 2018-03-15 ENCOUNTER — Other Ambulatory Visit: Payer: Self-pay

## 2018-03-15 ENCOUNTER — Encounter: Payer: Self-pay | Admitting: Physician Assistant

## 2018-03-15 ENCOUNTER — Ambulatory Visit: Payer: 59 | Admitting: Physician Assistant

## 2018-03-15 VITALS — BP 116/72 | HR 88 | Temp 98.1°F | Resp 15 | Ht 66.0 in | Wt 158.4 lb

## 2018-03-15 DIAGNOSIS — H6981 Other specified disorders of Eustachian tube, right ear: Secondary | ICD-10-CM

## 2018-03-15 DIAGNOSIS — L710 Perioral dermatitis: Secondary | ICD-10-CM | POA: Diagnosis not present

## 2018-03-15 MED ORDER — FLUTICASONE PROPIONATE 50 MCG/ACT NA SUSP: 2.0000 | Freq: Every day | NASAL | 0 refills | Status: DC

## 2018-03-15 MED ORDER — DOXYCYCLINE HYCLATE 50 MG PO CAPS: 50.0000 mg | ORAL_CAPSULE | Freq: Two times a day (BID) | ORAL | 0 refills | Status: DC

## 2018-03-15 NOTE — Progress Notes (Signed)
Patient presents to clinic today c/o 1 month of intermittent R ear pressure and pain with clogged sensation. Denies fever, chills, URI symptoms. Denies tinniitus or drainage from the ear. Denies symptoms of L ear.    Patient also notes an itchy and burning rash of her face and now her eyelids, present for a few weeks. Has made sure to stop makeup. Denies any recent change in soaps, lotions or detergents. Notes the rash spares her lips. Denies recent travel or sick contact.   Past Medical History:  Diagnosis Date  . Anxiety   . Arthritis   . Depression   . Fibromyalgia   . Hypothyroidism 1999   post PTU Rx for hyperthyroidism  . IBS (irritable bowel syndrome)     Current Outpatient Medications on File Prior to Visit  Medication Sig Dispense Refill  . ALPRAZolam (XANAX) 0.5 MG tablet Take 1 tablet (0.5 mg total) by mouth 2 (two) times daily as needed for anxiety. 60 tablet 1  . ARIPiprazole (ABILIFY) 5 MG tablet TAKE 1 TABLET BY MOUTH EVERY DAY 90 tablet 1  . levothyroxine (SYNTHROID, LEVOTHROID) 150 MCG tablet TAKE 1 TABLET BY MOUTH EVERY DAY 90 tablet 0  . ondansetron (ZOFRAN) 4 MG tablet Take 1 tablet (4 mg total) by mouth every 8 (eight) hours as needed for nausea or vomiting. 20 tablet 0  . sertraline (ZOLOFT) 100 MG tablet TAKE 1.5 TABLETS BY MOUTH DAILY. 135 tablet 1  . SUMAtriptan (IMITREX) 50 MG tablet Take 1 tablet (50 mg total) by mouth every 2 (two) hours as needed for migraine. May repeat in 2 hours if headache persists or recurs. (Patient not taking: Reported on 01/22/2018) 10 tablet 6   No current facility-administered medications on file prior to visit.     Allergies  Allergen Reactions  . Methimazole Other (See Comments)    Body goes into arthritic shock    Family History  Problem Relation Age of Onset  . Diabetes Paternal Grandmother   . Hyperlipidemia Mother   . Diabetes Father   . Cancer Paternal Grandfather        lung  . ADD / ADHD Son   . Other Neg Hx         PCO    Social History   Socioeconomic History  . Marital status: Married    Spouse name: Not on file  . Number of children: Not on file  . Years of education: Not on file  . Highest education level: Not on file  Occupational History  . Occupation: Landscape architect: [employer]  Social Needs  . Financial resource strain: Not on file  . Food insecurity:    Worry: Not on file    Inability: Not on file  . Transportation needs:    Medical: Not on file    Non-medical: Not on file  Tobacco Use  . Smoking status: Never Smoker  . Smokeless tobacco: Never Used  Substance and Sexual Activity  . Alcohol use: Yes  . Drug use: No  . Sexual activity: Yes  Lifestyle  . Physical activity:    Days per week: Not on file    Minutes per session: Not on file  . Stress: Not on file  Relationships  . Social connections:    Talks on phone: Not on file    Gets together: Not on file    Attends religious service: Not on file    Active member of club or organization: Not on file  Attends meetings of clubs or organizations: Not on file    Relationship status: Not on file  Other Topics Concern  . Not on file  Social History Narrative  . Not on file    Review of Systems - See HPI.  All other ROS are negative.  BP 116/72   Pulse 88   Temp 98.1 F (36.7 C) (Oral)   Resp 15   Ht 5\' 6"  (1.676 m)   Wt 158 lb 6.4 oz (71.8 kg)   SpO2 99%   BMI 25.57 kg/m   Physical Exam  Constitutional: She appears well-developed and well-nourished.  HENT:  Head: Normocephalic and atraumatic.  Right Ear: External ear normal. A middle ear effusion (serous) is present.  Left Ear: External ear normal.  Nose: Nose normal.  Mouth/Throat: Oropharynx is clear and moist.  Eyes: Conjunctivae are normal.  Neck: Neck supple.  Cardiovascular: Normal rate, regular rhythm and normal heart sounds.  Pulmonary/Chest: Effort normal and breath sounds normal. No stridor. No respiratory distress. She  has no wheezes. She has no rales. She exhibits no tenderness.  Skin:     Vitals reviewed.   Recent Results (from the past 2160 hour(s))  TSH     Status: None   Collection Time: 01/22/18  3:46 PM  Result Value Ref Range   TSH 2.08 mIU/L    Comment:           Reference Range .           > or = 20 Years  0.40-4.50 .                Pregnancy Ranges           First trimester    0.26-2.66           Second trimester   0.55-2.73           Third trimester    0.43-2.91   Lipid panel     Status: Abnormal   Collection Time: 01/22/18  3:46 PM  Result Value Ref Range   Cholesterol 196 <200 mg/dL   HDL 65 >40 mg/dL   Triglycerides 81 <981 mg/dL   LDL Cholesterol (Calc) 113 (H) mg/dL (calc)    Comment: Reference range: <100 . Desirable range <100 mg/dL for primary prevention;   <70 mg/dL for patients with CHD or diabetic patients  with > or = 2 CHD risk factors. Marland Kitchen LDL-C is now calculated using the Teala Daffron-Hopkins  calculation, which is a validated novel method providing  better accuracy than the Friedewald equation in the  estimation of LDL-C.  Horald Pollen et al. Lenox Ahr. 1914;782(95): 2061-2068  (http://education.[facility].com/faq/FAQ164)    Total CHOL/HDL Ratio 3.0 <5.0 (calc)   Non-HDL Cholesterol (Calc) 131 (H) <130 mg/dL (calc)    Comment: For patients with diabetes plus 1 major ASCVD risk  factor, treating to a non-HDL-C goal of <100 mg/dL  (LDL-C of <62 mg/dL) is considered a therapeutic  option.   Basic metabolic panel     Status: Abnormal   Collection Time: 01/22/18  3:46 PM  Result Value Ref Range   Glucose, Bld 132 (H) 65 - 99 mg/dL    Comment: .            Fasting reference interval . For someone without known diabetes, a glucose value >125 mg/dL indicates that they may have diabetes and this should be confirmed with a follow-up test. .    BUN 15 7 - 25 mg/dL   Creat 1.30  0.50 - 1.10 mg/dL   BUN/Creatinine Ratio NOT APPLICABLE 6 - 22 (calc)   Sodium 136  135 - 146 mmol/L   Potassium 3.8 3.5 - 5.3 mmol/L   Chloride 101 98 - 110 mmol/L   CO2 26 20 - 32 mmol/L   Calcium 9.4 8.6 - 10.2 mg/dL  Hepatic function panel     Status: None   Collection Time: 01/22/18  3:46 PM  Result Value Ref Range   Total Protein 7.2 6.1 - 8.1 g/dL   Albumin 4.5 3.6 - 5.1 g/dL   Globulin 2.7 1.9 - 3.7 g/dL (calc)   AG Ratio 1.7 1.0 - 2.5 (calc)   Total Bilirubin 0.4 0.2 - 1.2 mg/dL   Bilirubin, Direct 0.1 0.0 - 0.2 mg/dL   Indirect Bilirubin 0.3 0.2 - 1.2 mg/dL (calc)   Alkaline phosphatase (APISO) 65 33 - 115 U/L   AST 21 10 - 30 U/L   ALT 12 6 - 29 U/L  CBC with Differential/Platelet     Status: None   Collection Time: 01/22/18  3:46 PM  Result Value Ref Range   WBC 7.0 3.8 - 10.8 Thousand/uL   RBC 4.58 3.80 - 5.10 Million/uL   Hemoglobin 13.3 11.7 - 15.5 g/dL   HCT 40.9 81.1 - 91.4 %   MCV 88.0 80.0 - 100.0 fL   MCH 29.0 27.0 - 33.0 pg   MCHC 33.0 32.0 - 36.0 g/dL   RDW 78.2 95.6 - 21.3 %   Platelets 304 140 - 400 Thousand/uL   MPV 9.6 7.5 - 12.5 fL   Neutro Abs 5,292 1,500 - 7,800 cells/uL   Lymphs Abs 1,106 850 - 3,900 cells/uL   WBC mixed population 455 200 - 950 cells/uL   Eosinophils Absolute 98 15 - 500 cells/uL   Basophils Absolute 49 0 - 200 cells/uL   Neutrophils Relative % 75.6 %   Total Lymphocyte 15.8 %   Monocytes Relative 6.5 %   Eosinophils Relative 1.4 %   Basophils Relative 0.7 %  Hemoglobin A1C w/out eAG     Status: None   Collection Time: 01/22/18  3:46 PM  Result Value Ref Range   Hgb A1c MFr Bld 5.3 <5.7 % of total Hgb    Comment: For the purpose of screening for the presence of diabetes: . <5.7%       Consistent with the absence of diabetes 5.7-6.4%    Consistent with increased risk for diabetes             (prediabetes) > or =6.5%  Consistent with diabetes . This assay result is consistent with a decreased risk of diabetes. . Currently, no consensus exists regarding use of hemoglobin A1c for diagnosis of  diabetes in children. . According to American Diabetes Association (ADA) guidelines, hemoglobin A1c <7.0% represents optimal control in non-pregnant diabetic patients. Different metrics may apply to specific patient populations.  Standards of Medical Care in Diabetes(ADA). .   TEST AUTHORIZATION     Status: None   Collection Time: 01/22/18  3:46 PM  Result Value Ref Range   TEST NAME: HEMOGLOBIN A1c    TEST CODE: [id number]    CLIENT CONTACT: TENET MCNEILL    REPORT ALWAYS MESSAGE SIGNATURE      Comment: . The laboratory testing on this patient was verbally requested or confirmed by the ordering physician or his or her authorized representative after contact with an employee of [employer]. Federal regulations require that we maintain on file written authorization for  all laboratory testing.  Accordingly we are asking that the ordering physician or his or her authorized representative sign a copy of this report and promptly return it to the client service representative. . . Signature:____________________________________________________ . Please fax this signed page to [phone] or return it via your [facility] courier.     Assessment/Plan: 1. Eustachian tube dysfunction, right Start Flonase daily. Supportive measures and OTC medications reviewed. Follow-up if not improving over the next 10-14 days.   2. Perioral dermatitis Start Doxycycline 50 mg BID. Will start for 2 weeks. Follow-up scheduled. Will extend at that time if symptoms are responding. Supportive measures reviewed with patient.    Piedad ClimesWilliam Cody Amine Adelson, PA-C

## 2018-03-15 NOTE — Patient Instructions (Signed)
Please keep well-hydrated and get plenty of rest.  Start the Flonase nasal spray, doing daily as directed. It will take several days to kick in and make a difference.  Start the Doxycycline as directed. Limit long periods of direct sun exposure. This should start to help the skin around the face and the eyelids. Limit makeup use. Keep skin clean and dry. Follow-up with me or Dr. Beverely Lowabori in 2 weeks for reassessment.

## 2018-04-06 ENCOUNTER — Other Ambulatory Visit: Payer: Self-pay | Admitting: Physician Assistant

## 2018-04-06 ENCOUNTER — Encounter: Payer: Self-pay | Admitting: Physician Assistant

## 2018-04-06 DIAGNOSIS — L309 Dermatitis, unspecified: Secondary | ICD-10-CM

## 2018-04-08 DIAGNOSIS — H01003 Unspecified blepharitis right eye, unspecified eyelid: Secondary | ICD-10-CM | POA: Diagnosis not present

## 2018-04-11 ENCOUNTER — Other Ambulatory Visit: Payer: Self-pay | Admitting: Family Medicine

## 2018-05-16 ENCOUNTER — Other Ambulatory Visit: Payer: Self-pay | Admitting: Family Medicine

## 2018-05-17 DIAGNOSIS — L718 Other rosacea: Secondary | ICD-10-CM | POA: Diagnosis not present

## 2018-05-18 DIAGNOSIS — R300 Dysuria: Secondary | ICD-10-CM | POA: Diagnosis not present

## 2018-05-18 DIAGNOSIS — N760 Acute vaginitis: Secondary | ICD-10-CM | POA: Diagnosis not present

## 2018-05-18 DIAGNOSIS — B373 Candidiasis of vulva and vagina: Secondary | ICD-10-CM | POA: Diagnosis not present

## 2018-05-24 DIAGNOSIS — Z01419 Encounter for gynecological examination (general) (routine) without abnormal findings: Secondary | ICD-10-CM | POA: Diagnosis not present

## 2018-05-24 DIAGNOSIS — Z6826 Body mass index (BMI) 26.0-26.9, adult: Secondary | ICD-10-CM | POA: Diagnosis not present

## 2018-05-24 DIAGNOSIS — Z1231 Encounter for screening mammogram for malignant neoplasm of breast: Secondary | ICD-10-CM | POA: Diagnosis not present

## 2018-05-24 LAB — HM MAMMOGRAPHY: HM Mammogram: NORMAL (ref 0–4)

## 2018-05-24 LAB — HM PAP SMEAR

## 2018-06-23 ENCOUNTER — Encounter: Payer: Self-pay | Admitting: Family Medicine

## 2018-06-23 ENCOUNTER — Ambulatory Visit (INDEPENDENT_AMBULATORY_CARE_PROVIDER_SITE_OTHER): Payer: 59 | Admitting: Family Medicine

## 2018-06-23 ENCOUNTER — Other Ambulatory Visit: Payer: Self-pay

## 2018-06-23 VITALS — BP 111/78 | HR 80 | Temp 98.1°F | Resp 16 | Ht 66.0 in | Wt 161.5 lb

## 2018-06-23 DIAGNOSIS — Z0000 Encounter for general adult medical examination without abnormal findings: Secondary | ICD-10-CM | POA: Diagnosis not present

## 2018-06-23 DIAGNOSIS — E039 Hypothyroidism, unspecified: Secondary | ICD-10-CM

## 2018-06-23 DIAGNOSIS — Z23 Encounter for immunization: Secondary | ICD-10-CM | POA: Diagnosis not present

## 2018-06-23 LAB — CBC WITH DIFFERENTIAL/PLATELET
Basophils Absolute: 0 10*3/uL (ref 0.0–0.1)
Basophils Relative: 0.5 % (ref 0.0–3.0)
Eosinophils Absolute: 0.1 10*3/uL (ref 0.0–0.7)
Eosinophils Relative: 1.6 % (ref 0.0–5.0)
HCT: 41.6 % (ref 36.0–46.0)
Hemoglobin: 13.7 g/dL (ref 12.0–15.0)
Lymphocytes Relative: 15.6 % (ref 12.0–46.0)
Lymphs Abs: 1.2 10*3/uL (ref 0.7–4.0)
MCHC: 33 g/dL (ref 30.0–36.0)
MCV: 92.3 fl (ref 78.0–100.0)
Monocytes Absolute: 0.5 10*3/uL (ref 0.1–1.0)
Monocytes Relative: 6.4 % (ref 3.0–12.0)
Neutro Abs: 5.7 10*3/uL (ref 1.4–7.7)
Neutrophils Relative %: 75.9 % (ref 43.0–77.0)
Platelets: 315 10*3/uL (ref 150.0–400.0)
RBC: 4.51 Mil/uL (ref 3.87–5.11)
RDW: 15 % (ref 11.5–15.5)
WBC: 7.5 10*3/uL (ref 4.0–10.5)

## 2018-06-23 LAB — LIPID PANEL
Cholesterol: 184 mg/dL (ref 0–200)
HDL: 71 mg/dL (ref >39.00)
LDL Cholesterol: 86 mg/dL (ref 0–99)
NonHDL: 113.36
Total CHOL/HDL Ratio: 3
Triglycerides: 136 mg/dL (ref 0.0–149.0)
VLDL: 27.2 mg/dL (ref 0.0–40.0)

## 2018-06-23 LAB — HEPATIC FUNCTION PANEL
ALT: 14 U/L (ref 0–35)
AST: 22 U/L (ref 0–37)
Albumin: 4.4 g/dL (ref 3.5–5.2)
Alkaline Phosphatase: 80 U/L (ref 39–117)
Bilirubin, Direct: 0.1 mg/dL (ref 0.0–0.3)
Total Bilirubin: 0.4 mg/dL (ref 0.2–1.2)
Total Protein: 7.4 g/dL (ref 6.0–8.3)

## 2018-06-23 LAB — BASIC METABOLIC PANEL
BUN: 15 mg/dL (ref 6–23)
CO2: 29 mEq/L (ref 19–32)
Calcium: 9.4 mg/dL (ref 8.4–10.5)
Chloride: 102 mEq/L (ref 96–112)
Creatinine, Ser: 0.79 mg/dL (ref 0.40–1.20)
GFR: 84.38 mL/min (ref >60.00)
Glucose, Bld: 117 mg/dL — ABNORMAL HIGH (ref 70–99)
Potassium: 3.9 mEq/L (ref 3.5–5.1)
Sodium: 139 mEq/L (ref 135–145)

## 2018-06-23 LAB — TSH: TSH: 5.8 u[IU]/mL — ABNORMAL HIGH (ref 0.35–4.50)

## 2018-06-23 NOTE — Assessment & Plan Note (Signed)
Pt's PE WNL.  UTD on GYN.  Flu shot given today.  Check labs.  Anticipatory guidance provided.  

## 2018-06-23 NOTE — Progress Notes (Signed)
   Subjective:    Patient ID: [name], female    DOB: December 21, 1975, 42 y.o.   MRN: [id number]  HPI CPE- UTD on pap, mammo, Tdap.   Review of Systems Patient reports no vision/ hearing changes, adenopathy,fever, weight change,  persistant/recurrent hoarseness , swallowing issues, chest pain, palpitations, edema, persistant/recurrent cough, hemoptysis, dyspnea (rest/exertional/paroxysmal nocturnal), gastrointestinal bleeding (melena, rectal bleeding), abdominal pain, significant heartburn, bowel changes, GU symptoms (dysuria, hematuria, incontinence), Gyn symptoms (abnormal  bleeding, pain),  syncope, focal weakness, memory loss, numbness & tingling, skin/hair/nail changes, abnormal bruising or bleeding, anxiety, or depression.     Objective:   Physical Exam General Appearance:    Alert, cooperative, no distress, appears stated age  Head:    Normocephalic, without obvious abnormality, atraumatic  Eyes:    PERRL, conjunctiva/corneas clear, EOM's intact, fundi    benign, both eyes  Ears:    Normal TM's and external ear canals, both ears  Nose:   Nares normal, septum midline, mucosa normal, no drainage    or sinus tenderness  Throat:   Lips, mucosa, and tongue normal; teeth and gums normal  Neck:   Supple, symmetrical, trachea midline, no adenopathy;    Thyroid: no enlargement/tenderness/nodules  Back:     Symmetric, no curvature, ROM normal, no CVA tenderness  Lungs:     Clear to auscultation bilaterally, respirations unlabored  Chest Wall:    No tenderness or deformity   Heart:    Regular rate and rhythm, S1 and S2 normal, no murmur, rub   or gallop  Breast Exam:    Deferred to GYN  Abdomen:     Soft, non-tender, bowel sounds active all four quadrants,    no masses, no organomegaly  Genitalia:    Deferred to GYN  Rectal:    Extremities:   Extremities normal, atraumatic, no cyanosis or edema  Pulses:   2+ and symmetric all extremities  Skin:   Skin color, texture, turgor normal, no  rashes or lesions  Lymph nodes:   Cervical, supraclavicular, and axillary nodes normal  Neurologic:   CNII-XII intact, normal strength, sensation and reflexes    throughout          Assessment & Plan:

## 2018-06-23 NOTE — Assessment & Plan Note (Signed)
Chronic problem.  Currently asymptomatic.  Check labs.  Adjust meds prn  

## 2018-06-23 NOTE — Patient Instructions (Signed)
Follow up in 1 year or as needed We'll notify you of your lab results and make any changes if needed Keep up the good work on healthy diet and regular exercise- you look great! Call with any questions or concerns Happy Fall!!! 

## 2018-06-24 ENCOUNTER — Other Ambulatory Visit (INDEPENDENT_AMBULATORY_CARE_PROVIDER_SITE_OTHER): Payer: 59

## 2018-06-24 DIAGNOSIS — R7309 Other abnormal glucose: Secondary | ICD-10-CM | POA: Diagnosis not present

## 2018-06-24 LAB — HEMOGLOBIN A1C: Hgb A1c MFr Bld: 5.9 % (ref 4.6–6.5)

## 2018-06-28 ENCOUNTER — Encounter: Payer: 59 | Admitting: Family Medicine

## 2018-07-13 ENCOUNTER — Ambulatory Visit: Payer: 59 | Admitting: Physician Assistant

## 2018-07-13 ENCOUNTER — Encounter: Payer: Self-pay | Admitting: Physician Assistant

## 2018-07-13 ENCOUNTER — Other Ambulatory Visit: Payer: Self-pay

## 2018-07-13 ENCOUNTER — Ambulatory Visit: Payer: Self-pay

## 2018-07-13 VITALS — BP 100/70 | HR 96 | Temp 98.6°F | Resp 14 | Ht 66.0 in | Wt 162.0 lb

## 2018-07-13 DIAGNOSIS — J019 Acute sinusitis, unspecified: Secondary | ICD-10-CM

## 2018-07-13 DIAGNOSIS — B9689 Other specified bacterial agents as the cause of diseases classified elsewhere: Secondary | ICD-10-CM

## 2018-07-13 MED ORDER — BENZONATATE 100 MG PO CAPS: 100.0000 mg | ORAL_CAPSULE | Freq: Three times a day (TID) | ORAL | 0 refills | Status: DC | PRN

## 2018-07-13 MED ORDER — AMOXICILLIN-POT CLAVULANATE 875-125 MG PO TABS: 1.0000 | ORAL_TABLET | Freq: Two times a day (BID) | ORAL | 0 refills | Status: DC

## 2018-07-13 NOTE — Progress Notes (Signed)
Acute Office Visit  Subjective:    Patient ID: [name], female    DOB: 08/04/1976, 42 y.o.   MRN: [id number]  Chief Complaint  Patient presents with  . URI    HPI Patient is in today for acute illness. Notes 7 days of sinus pressure, PND, sinus pain, headache, cough that is now productive of yellow sputum. Noted fever yesterday.  Denies ear pain, tooth pain. Notes maxillary sinus pain.  Denies recent travel or sick contact. Has taken Alka Seltzer for symptoms.  Past Medical History:  Diagnosis Date  . Anxiety   . Arthritis   . Depression   . Fibromyalgia   . Hypothyroidism 1999   post PTU Rx for hyperthyroidism  . IBS (irritable bowel syndrome)     Past Surgical History:  Procedure Laterality Date  . CESAREAN SECTION     x 2    Family History  Problem Relation Age of Onset  . Diabetes Paternal Grandmother   . Hyperlipidemia Mother   . Diabetes Father   . Cancer Paternal Grandfather        lung  . ADD / ADHD Son   . Other Neg Hx        PCO    Social History   Socioeconomic History  . Marital status: Married    Spouse name: Not on file  . Number of children: Not on file  . Years of education: Not on file  . Highest education level: Not on file  Occupational History  . Occupation: Landscape architect: [employer]  Social Needs  . Financial resource strain: Not on file  . Food insecurity:    Worry: Not on file    Inability: Not on file  . Transportation needs:    Medical: Not on file    Non-medical: Not on file  Tobacco Use  . Smoking status: Never Smoker  . Smokeless tobacco: Never Used  Substance and Sexual Activity  . Alcohol use: Yes  . Drug use: No  . Sexual activity: Yes  Lifestyle  . Physical activity:    Days per week: Not on file    Minutes per session: Not on file  . Stress: Not on file  Relationships  . Social connections:    Talks on phone: Not on file    Gets together: Not on file    Attends religious service: Not on  file    Active member of club or organization: Not on file    Attends meetings of clubs or organizations: Not on file    Relationship status: Not on file  . Intimate partner violence:    Fear of current or ex partner: Not on file    Emotionally abused: Not on file    Physically abused: Not on file    Forced sexual activity: Not on file  Other Topics Concern  . Not on file  Social History Narrative  . Not on file    Outpatient Medications Prior to Visit  Medication Sig Dispense Refill  . ALPRAZolam (XANAX) 0.5 MG tablet Take 1 tablet (0.5 mg total) by mouth 2 (two) times daily as needed for anxiety. 60 tablet 1  . ARIPiprazole (ABILIFY) 5 MG tablet TAKE 1 TABLET BY MOUTH EVERY DAY 90 tablet 1  . fluticasone (FLONASE) 50 MCG/ACT nasal spray SPRAY 2 SPRAYS INTO EACH NOSTRIL EVERY DAY 16 g 5  . levothyroxine (SYNTHROID, LEVOTHROID) 150 MCG tablet TAKE 1 TABLET BY MOUTH EVERY DAY 90 tablet 0  .  sertraline (ZOLOFT) 100 MG tablet TAKE 1.5 TABLETS BY MOUTH DAILY. 135 tablet 1  . SUMAtriptan (IMITREX) 50 MG tablet Take 1 tablet (50 mg total) by mouth every 2 (two) hours as needed for migraine. May repeat in 2 hours if headache persists or recurs. 10 tablet 6   No facility-administered medications prior to visit.     Allergies  Allergen Reactions  . Methimazole Other (See Comments)    Body goes into arthritic shock    ROS Pertinent ROS are listed in the HPI.    Objective:    Physical Exam  Constitutional: She is oriented to person, place, and time. She appears well-developed and well-nourished.  HENT:  Head: Normocephalic and atraumatic.  Right Ear: Tympanic membrane and external ear normal.  Left Ear: Tympanic membrane and external ear normal.  Nose: Mucosal edema and rhinorrhea present. Right sinus exhibits frontal sinus tenderness. Left sinus exhibits frontal sinus tenderness.  Mouth/Throat: Uvula is midline and oropharynx is clear and moist.  Eyes: Conjunctivae are normal.    Neck: Neck supple.  Cardiovascular: Normal rate, regular rhythm, normal heart sounds and intact distal pulses.  Pulmonary/Chest: Effort normal and breath sounds normal. No respiratory distress. She has no wheezes. She has no rales. She exhibits no tenderness.  Lymphadenopathy:    She has no cervical adenopathy.  Neurological: She is alert and oriented to person, place, and time.  Vitals reviewed.     BP 100/70   Pulse 96   Temp 98.6 F (37 C) (Oral)   Resp 14   Ht 5\' 6"  (1.676 m)   Wt 162 lb (73.5 kg)   SpO2 98%   BMI 26.15 kg/m  Wt Readings from Last 3 Encounters:  07/13/18 162 lb (73.5 kg)  06/23/18 161 lb 8 oz (73.3 kg)  03/15/18 158 lb 6.4 oz (71.8 kg)    There are no preventive care reminders to display for this patient.  There are no preventive care reminders to display for this patient.   Lab Results  Component Value Date   TSH 5.80 (H) 06/23/2018   Lab Results  Component Value Date   WBC 7.5 06/23/2018   HGB 13.7 06/23/2018   HCT 41.6 06/23/2018   MCV 92.3 06/23/2018   PLT 315.0 06/23/2018   Lab Results  Component Value Date   NA 139 06/23/2018   K 3.9 06/23/2018   CO2 29 06/23/2018   GLUCOSE 117 (H) 06/23/2018   BUN 15 06/23/2018   CREATININE 0.79 06/23/2018   BILITOT 0.4 06/23/2018   ALKPHOS 80 06/23/2018   AST 22 06/23/2018   ALT 14 06/23/2018   PROT 7.4 06/23/2018   ALBUMIN 4.4 06/23/2018   CALCIUM 9.4 06/23/2018   GFR 84.38 06/23/2018   Lab Results  Component Value Date   CHOL 184 06/23/2018   Lab Results  Component Value Date   HDL 71.00 06/23/2018   Lab Results  Component Value Date   LDLCALC 86 06/23/2018   Lab Results  Component Value Date   TRIG 136.0 06/23/2018   Lab Results  Component Value Date   CHOLHDL 3 06/23/2018   Lab Results  Component Value Date   HGBA1C 5.9 06/24/2018       Assessment & Plan:   1. Acute bacterial sinusitis Rx Augmentin, Tessalon.  Increase fluids.  Rest.  Saline nasal spray.   Probiotic.  Mucinex as directed.  Humidifier in bedroom. Continue Flonase.  Call or return to clinic if symptoms are not improving.  - amoxicillin-clavulanate (  AUGMENTIN) 875-125 MG tablet; Take 1 tablet by mouth 2 (two) times daily.  Dispense: 14 tablet; Refill: 0 - benzonatate (TESSALON) 100 MG capsule; Take 1 capsule (100 mg total) by mouth 3 (three) times daily as needed for cough.  Dispense: 30 capsule; Refill: 0    Piedad Climes, PA-C

## 2018-07-13 NOTE — Patient Instructions (Signed)
Please take antibiotic as directed.  Increase fluid intake.  Use Saline nasal spray.  Take a daily multivitamin. Continue Flonase. Use the cough medication as directed.  Place a humidifier in the bedroom.  Please call or return clinic if symptoms are not improving.  Sinusitis Sinusitis is redness, soreness, and swelling (inflammation) of the paranasal sinuses. Paranasal sinuses are air pockets within the bones of your face (beneath the eyes, the middle of the forehead, or above the eyes). In healthy paranasal sinuses, mucus is able to drain out, and air is able to circulate through them by way of your nose. However, when your paranasal sinuses are inflamed, mucus and air can become trapped. This can allow bacteria and other germs to grow and cause infection. Sinusitis can develop quickly and last only a short time (acute) or continue over a long period (chronic). Sinusitis that lasts for more than 12 weeks is considered chronic.  CAUSES  Causes of sinusitis include:  Allergies.  Structural abnormalities, such as displacement of the cartilage that separates your nostrils (deviated septum), which can decrease the air flow through your nose and sinuses and affect sinus drainage.  Functional abnormalities, such as when the small hairs (cilia) that line your sinuses and help remove mucus do not work properly or are not present. SYMPTOMS  Symptoms of acute and chronic sinusitis are the same. The primary symptoms are pain and pressure around the affected sinuses. Other symptoms include:  Upper toothache.  Earache.  Headache.  Bad breath.  Decreased sense of smell and taste.  A cough, which worsens when you are lying flat.  Fatigue.  Fever.  Thick drainage from your nose, which often is green and may contain pus (purulent).  Swelling and warmth over the affected sinuses. DIAGNOSIS  Your caregiver will perform a physical exam. During the exam, your caregiver may:  Look in your nose for  signs of abnormal growths in your nostrils (nasal polyps).  Tap over the affected sinus to check for signs of infection.  View the inside of your sinuses (endoscopy) with a special imaging device with a light attached (endoscope), which is inserted into your sinuses. If your caregiver suspects that you have chronic sinusitis, one or more of the following tests may be recommended:  Allergy tests.  Nasal culture A sample of mucus is taken from your nose and sent to a lab and screened for bacteria.  Nasal cytology A sample of mucus is taken from your nose and examined by your caregiver to determine if your sinusitis is related to an allergy. TREATMENT  Most cases of acute sinusitis are related to a viral infection and will resolve on their own within 10 days. Sometimes medicines are prescribed to help relieve symptoms (pain medicine, decongestants, nasal steroid sprays, or saline sprays).  However, for sinusitis related to a bacterial infection, your caregiver will prescribe antibiotic medicines. These are medicines that will help kill the bacteria causing the infection.  Rarely, sinusitis is caused by a fungal infection. In theses cases, your caregiver will prescribe antifungal medicine. For some cases of chronic sinusitis, surgery is needed. Generally, these are cases in which sinusitis recurs more than 3 times per year, despite other treatments. HOME CARE INSTRUCTIONS   Drink plenty of water. Water helps thin the mucus so your sinuses can drain more easily.  Use a humidifier.  Inhale steam 3 to 4 times a day (for example, sit in the bathroom with the shower running).  Apply a warm, moist washcloth to  your face 3 to 4 times a day, or as directed by your caregiver.  Use saline nasal sprays to help moisten and clean your sinuses.  Take over-the-counter or prescription medicines for pain, discomfort, or fever only as directed by your caregiver. SEEK IMMEDIATE MEDICAL CARE IF:  You have  increasing pain or severe headaches.  You have nausea, vomiting, or drowsiness.  You have swelling around your face.  You have vision problems.  You have a stiff neck.  You have difficulty breathing. MAKE SURE YOU:   Understand these instructions.  Will watch your condition.  Will get help right away if you are not doing well or get worse. Document Released: 08/11/2005 Document Revised: 11/03/2011 Document Reviewed: 08/26/2011 [facility] Patient Information 2014 [place], [place].

## 2018-07-13 NOTE — Telephone Encounter (Signed)
Patient called in with c/o "cough, wheezing." She says "it started Saturday with my chest feeling tight and heavy when I cough. I'm not always coughing up phlegm, but when I do it's yellowish-green, no blood." I asked about fever, she says "I don't think so, but yesterday I was so hot and couldn't get cool." I asked about other symptoms, she says "nasal congestion with yellowish-green, runny nose, sneezing, wheezing sounds, chest pain/tightness when coughing and pressure to my throat when coughing." According to protocol, see PCP within 24 hours, no availability with PCP, appointment scheduled for today at 1345 [facility], care advice given, patient verbalized understanding.  Reason for Disposition . [1] Continuous (nonstop) coughing interferes with work or school AND [2] no improvement using cough treatment per Care Advice  Answer Assessment - Initial Assessment Questions 1. ONSET: "When did the cough begin?"      Saturday 2. SEVERITY: "How bad is the cough today?"      Worse today 3. RESPIRATORY DISTRESS: "Describe your breathing."      Wheezing, heaviness on chest 4. FEVER: "Do you have a fever?" If so, ask: "What is your temperature, how was it measured, and when did it start?"     I don't think so 5. SPUTUM: "Describe the color of your sputum" (clear, white, yellow, green)     When it comes up, it's yellowish-green 6. HEMOPTYSIS: "Are you coughing up any blood?" If so ask: "How much?" (flecks, streaks, tablespoons, etc.)     No 7. CARDIAC HISTORY: "Do you have any history of heart disease?" (e.g., heart attack, congestive heart failure)      No 8. LUNG HISTORY: "Do you have any history of lung disease?"  (e.g., pulmonary embolus, asthma, emphysema)     No 9. PE RISK FACTORS: "Do you have a history of blood clots?" (or: recent major surgery, recent prolonged travel, bedridden)     No 10. OTHER SYMPTOMS: "Do you have any other symptoms?" (e.g., runny nose, wheezing, chest  pain)     Nasal congestion, runny nose, sneezing, chest pain/tight when cough, pressure to throat when cough 11. PREGNANCY: "Is there any chance you are pregnant?" "When was your last menstrual period?"       No 12. TRAVEL: "Have you traveled out of the country in the last month?" (e.g., travel history, exposures)      Yes, [place]  Protocols used: COUGH - ACUTE PRODUCTIVE-A-AH

## 2018-07-16 ENCOUNTER — Ambulatory Visit: Payer: Self-pay | Admitting: Hematology

## 2018-07-16 NOTE — Telephone Encounter (Signed)
Patient seen by Selena Battenody on 07-13-2018.  Patient states today she is coughing more.  Patient was encouraged to continue antibiotic course.  Patient was advised she could take tylenol or Ibuprofen for headache.  Patient encouraged to us cough drops to sooth throat, to blow her nose as needed.  Patient was instructed to take the tessalon as prescribed, and she was also told she could use OTC cough medication if needed.  Patient instructed to rest, and drink warm fluids over the weekend.  If no improvement call back on Monday Reason for Disposition . Cough with cold symptoms (e.g., runny nose, postnasal drip, throat clearing) . ALSO, mild central chest pain occurs only when coughing  Answer Assessment - Initial Assessment Questions 1. ONSET: "When did the cough begin?"      07-07-18 2. SEVERITY: "How bad is the cough today?"  Worse today 3. RESPIRATORY DISTRESS: "Describe your breathing."    Some shortness of breath 4. FEVER: "Do you have a fever?" If so, ask: "What is your temperature, how was it measured, and when did it start?" no 5. SPUTUM: "Describe the color of your sputum" (clear, white, yellow, green)     yellow 6. HEMOPTYSIS: "Are you coughing up any blood?" If so ask: "How much?" (flecks, streaks, tablespoons, etc.) no 7. CARDIAC HISTORY: "Do you have any history of heart disease?" (e.g., heart attack, congestive heart failure)      no 8. LUNG HISTORY: "Do you have any history of lung disease?"  (e.g., pulmonary embolus, asthma, emphysema) no  10. OTHER SYMPTOMS: "Do you have any other symptoms?" (e.g., runny nose, wheezing, chest pain)       Runny nose, chest soreness, sneezing.  Protocols used: COUGH - ACUTE PRODUCTIVE-A-AH

## 2018-07-17 ENCOUNTER — Other Ambulatory Visit: Payer: Self-pay | Admitting: Family Medicine

## 2018-07-20 ENCOUNTER — Other Ambulatory Visit: Payer: Self-pay | Admitting: General Practice

## 2018-07-20 ENCOUNTER — Other Ambulatory Visit: Payer: Self-pay | Admitting: Family Medicine

## 2018-07-20 ENCOUNTER — Ambulatory Visit: Payer: Self-pay | Admitting: *Deleted

## 2018-07-20 DIAGNOSIS — E039 Hypothyroidism, unspecified: Secondary | ICD-10-CM

## 2018-07-20 MED ORDER — LEVOTHYROXINE SODIUM 175 MCG PO TABS: 175.0000 ug | ORAL_TABLET | Freq: Every day | ORAL | 3 refills | Status: DC

## 2018-07-20 NOTE — Telephone Encounter (Signed)
Pt states her cough is getting worse and she only has 2 of the antibiotic pills left. Pt states she was told to call back if her symptoms did not get any better. Pt requests a call back from a nurse. Cb# [phone]  Patient states her cough is not better- nothing is moving in her chest. Appointment scheduled with PCP per patient request.  Reason for Disposition . [1] Continuous (nonstop) coughing interferes with work or school AND [2] no improvement using cough treatment per protocol  Answer Assessment - Initial Assessment Questions 1. ONSET: "When did the cough begin?"      Since 11/13 2. SEVERITY: "How bad is the cough today?"      Cough is not productive- not coughing up anything- very rare to cough mucus up. Abdomin and head is sore from coughing so much. 3. RESPIRATORY DISTRESS: "Describe your breathing."      Breathing in produces more coughing 4. FEVER: "Do you have a fever?" If so, ask: "What is your temperature, how was it measured, and when did it start?"     No fever 5. HEMOPTYSIS: "Are you coughing up any blood?" If so ask: "How much?" (flecks, streaks, tablespoons, etc.)     Yellow- when she does get something up 6. TREATMENT: "What have you done so far to treat the cough?" (e.g., meds, fluids, humidifier)     Rx cough medication, antibiotic, mucinex, cough drops, increased fliuds 7. CARDIAC HISTORY: "Do you have any history of heart disease?" (e.g., heart attack, congestive heart failure)      no 8. LUNG HISTORY: "Do you have any history of lung disease?"  (e.g., pulmonary embolus, asthma, emphysema)     no 9. PE RISK FACTORS: "Do you have a history of blood clots?" (or: recent major surgery, recent prolonged travel, bedridden)     no 10. OTHER SYMPTOMS: "Do you have any other symptoms? (e.g., runny nose, wheezing, chest pain)       Runny nose, chest congestion 11. PREGNANCY: "Is there any chance you are pregnant?" "When was your last menstrual period?"       n/a 12.  TRAVEL: "Have you traveled out of the country in the last month?" (e.g., travel history, exposures)       n/a  Protocols used: COUGH - ACUTE NON-PRODUCTIVE-A-AH

## 2018-07-20 NOTE — Telephone Encounter (Signed)
Medication filled to pharmacy as requested.   

## 2018-07-21 ENCOUNTER — Ambulatory Visit: Payer: 59 | Admitting: Family Medicine

## 2018-07-21 ENCOUNTER — Other Ambulatory Visit: Payer: Self-pay

## 2018-07-21 ENCOUNTER — Encounter: Payer: Self-pay | Admitting: Family Medicine

## 2018-07-21 VITALS — BP 126/82 | HR 86 | Temp 98.1°F | Resp 16 | Ht 66.0 in | Wt 162.4 lb

## 2018-07-21 DIAGNOSIS — J208 Acute bronchitis due to other specified organisms: Secondary | ICD-10-CM

## 2018-07-21 DIAGNOSIS — J329 Chronic sinusitis, unspecified: Secondary | ICD-10-CM | POA: Diagnosis not present

## 2018-07-21 DIAGNOSIS — B9689 Other specified bacterial agents as the cause of diseases classified elsewhere: Secondary | ICD-10-CM | POA: Diagnosis not present

## 2018-07-21 MED ORDER — DOXYCYCLINE HYCLATE 100 MG PO TABS: 100.0000 mg | ORAL_TABLET | Freq: Two times a day (BID) | ORAL | 0 refills | Status: DC

## 2018-07-21 MED ORDER — ALBUTEROL SULFATE HFA 108 (90 BASE) MCG/ACT IN AERS: 2.0000 | INHALATION_SPRAY | RESPIRATORY_TRACT | 6 refills | Status: DC | PRN

## 2018-07-21 MED ORDER — GUAIFENESIN-CODEINE 100-10 MG/5ML PO SYRP: 10.0000 mL | ORAL_SOLUTION | Freq: Three times a day (TID) | ORAL | 0 refills | Status: DC | PRN

## 2018-07-21 MED ORDER — ALBUTEROL SULFATE (2.5 MG/3ML) 0.083% IN NEBU
2.5000 mg | INHALATION_SOLUTION | Freq: Once | RESPIRATORY_TRACT | Status: AC
  Administered 2018-07-21: 2.5 mg via RESPIRATORY_TRACT

## 2018-07-21 NOTE — Progress Notes (Signed)
   Subjective:    Patient ID: [name], female    DOB: [birth year], 42 y.o.   MRN: [id number]  HPI Cough- pt was seen on 11/19 and started on Augmentin for bacterial sinusitis (completed 7 day course this AM).  She reports cough is worsening and chest feels tight.  Some SOB.  Cough is wet but not productive.  + nasal congestion/drainage.  No fevers.  Sinus pressure has not improved.     Review of Systems For ROS see HPI     Objective:   Physical Exam  Constitutional: She is oriented to person, place, and time. She appears well-developed and well-nourished. No distress.  HENT:  Head: Normocephalic and atraumatic.  Right Ear: Tympanic membrane normal.  Left Ear: Tympanic membrane normal.  Nose: Mucosal edema and rhinorrhea present. Right sinus exhibits maxillary sinus tenderness and frontal sinus tenderness. Left sinus exhibits maxillary sinus tenderness and frontal sinus tenderness.  Mouth/Throat: Uvula is midline and mucous membranes are normal. Posterior oropharyngeal erythema present. No oropharyngeal exudate.  Eyes: Pupils are equal, round, and reactive to light. Conjunctivae and EOM are normal.  Neck: Normal range of motion. Neck supple.  Cardiovascular: Normal rate, regular rhythm and normal heart sounds.  Pulmonary/Chest: Effort normal and breath sounds normal. No respiratory distress.  Decreased breath sounds throughout- improved s/p neb tx Wet, hacking cough- improved s/p neb tx  Lymphadenopathy:    She has no cervical adenopathy.  Neurological: She is alert and oriented to person, place, and time.  Skin: Skin is warm and dry.  Psychiatric: She has a normal mood and affect. Her behavior is normal. Thought content normal.          Assessment & Plan:  Sinusitis- no response to Augmentin.  Based on this, will switch to Doxy.  Reviewed supportive care and red flags that should prompt return.  Pt expressed understanding and is in agreement w/ plan.   Bronchitis- cough and  air movement improved s/p neb tx.  Albuterol inhaler given.  Start cough meds.  Reviewed supportive care and red flags that should prompt return.  Pt expressed understanding and is in agreement w/ plan.

## 2018-07-21 NOTE — Patient Instructions (Signed)
Follow up as needed or as scheduled START the Doxycycline twice daily- take w/ food USE the cough syrup as needed- may cause drowsiness USE the albuterol inhaler as needed for cough or shortness of breath- 2 puffs Drink plenty of fluids REST!! Call with any questions or concerns Hang in there! Happy Thanksgiving!!!

## 2018-08-20 ENCOUNTER — Ambulatory Visit: Payer: Self-pay | Admitting: *Deleted

## 2018-08-20 ENCOUNTER — Other Ambulatory Visit (INDEPENDENT_AMBULATORY_CARE_PROVIDER_SITE_OTHER): Payer: 59

## 2018-08-20 DIAGNOSIS — E039 Hypothyroidism, unspecified: Secondary | ICD-10-CM | POA: Diagnosis not present

## 2018-08-20 LAB — TSH: TSH: 3.37 u[IU]/mL (ref 0.35–4.50)

## 2018-08-20 MED ORDER — PREGABALIN 75 MG PO CAPS: 75.0000 mg | ORAL_CAPSULE | Freq: Two times a day (BID) | ORAL | 3 refills | Status: DC

## 2018-08-20 NOTE — Telephone Encounter (Signed)
Fibromyalgia pain primarily in her legs but having pain alll over. Has taken the Mobic but not relieving pain, not strong enough. Requesting another pain med for it.   Also would like to take something other than xanax because it makes her so sleepy when she takes it.    She would like for her provider to give her a call back today. [facility].

## 2018-08-20 NOTE — Telephone Encounter (Signed)
I am happy to send in Lyrica to take for her fibromyalgia pain but I am confused by asking for something other than Xanax.  This is not used to treat fibromyalgia.  I know she wants relief from the pain, so I can send in the medication (Lyrica 75mg  BID, #60, 1 refill), but ideally this is something that should be discussed at an office visit.  I recommend scheduling a visit in the next 2-3 weeks to discuss how the medications are working

## 2018-08-20 NOTE — Telephone Encounter (Signed)
Spoke with pt to inform. Pt states she is currently taking Xanax for her anxiety but it is making her sleepy. She would like to switch to something else. Would you like her to come in for OV for that next week or is it okay to wait until she follows up for the Lyrica. She would also like to know if its okay to take Lyrica with all the other medications shes on?

## 2018-08-20 NOTE — Telephone Encounter (Signed)
Message from Angela Nevinandice N Williams sent at 08/20/2018 10:28 AM EST   Summary: medication question- pain associated with fibromyalgia    Patient is requesting a call back to discuss pain patient states is associated with Fibromyalgia and would like to discuss what medications are typically prescribed. Patient states that she previously was on meloxicam (MOBIC) 15 MG tablet but does not feel it was "strong enough". Please advise.          Left message for pt to return call to office to discuss symptoms.

## 2018-08-20 NOTE — Addendum Note (Signed)
Addended by: Sheliah HatchABORI, Daphyne Miguez E on: 08/20/2018 03:07 PM   Modules accepted: Orders

## 2018-08-20 NOTE — Telephone Encounter (Signed)
Spoke with pt to inform. Pt scheduled appt to discuss anxiety.

## 2018-08-20 NOTE — Telephone Encounter (Signed)
I sent in the Lyrica for twice daily.  This is ok to use in combination w/ current meds.  In order to change her anxiety medication, I recommend she schedule an appt as this is hard to do over the phone.  We can see her next week for the Anxiety if she would like rather than waiting until her lyrica follow up.

## 2018-08-26 ENCOUNTER — Other Ambulatory Visit: Payer: Self-pay

## 2018-08-26 ENCOUNTER — Encounter: Payer: Self-pay | Admitting: Family Medicine

## 2018-08-26 ENCOUNTER — Ambulatory Visit: Payer: 59 | Admitting: Family Medicine

## 2018-08-26 VITALS — BP 110/70 | HR 78 | Temp 98.1°F | Resp 16 | Ht 66.0 in | Wt 171.4 lb

## 2018-08-26 DIAGNOSIS — M797 Fibromyalgia: Secondary | ICD-10-CM | POA: Diagnosis not present

## 2018-08-26 DIAGNOSIS — F411 Generalized anxiety disorder: Secondary | ICD-10-CM

## 2018-08-26 NOTE — Patient Instructions (Signed)
Follow up by phone or MyChart to let me know if the 1/2 tab works CONTINUE the Lyrica- being mindful of food choices and regular exercise For your next panicked moment, try a 1/2 tab of Xanax If the 1/2 tab doesn't work, we'll send in the Clonazepam Call with any questions or concerns Happy New Year!!

## 2018-08-26 NOTE — Assessment & Plan Note (Signed)
Deteriorated recently w/ holiday stress.  Reports this is improving as holidays are now over but she finds that she needs a new rescue medication b/c the Xanax causes excessive sedation.  She has never attempted just a half tab.  Encouraged her to try this and discussed the possibility of trying a low dose Clonazepam as this is less abrupt in onset.  She is not interested in changing her daily medications at this time.  Will continue to follow closely.  Pt expressed understanding and is in agreement w/ plan.

## 2018-08-26 NOTE — Progress Notes (Signed)
   Subjective:    Patient ID: [name], female    DOB: 11-28-1975, 43 y.o.   MRN: [id number]  HPI Anxiety- pt reports stress level has been really high since December started but things are improving now that holidays are over.  Pt reports she doesn't like taking the Alprazolam b/c it causes excessive sedation.  Is looking for another rescue medication w/o sedation.  Pt has never tried 1/2 tab.  Fibro- pt reports pain is improving since starting Lyrica.  'it literally helped w/in an hour of taking the 1st pill'.  Pt was limping prior to taking medication and stopped limping shortly after 1st dose.  Pt has started walking again.   Review of Systems For ROS see HPI     Objective:   Physical Exam Vitals signs reviewed.  Constitutional:      General: She is not in acute distress.    Appearance: Normal appearance.  HENT:     Head: Normocephalic and atraumatic.  Skin:    General: Skin is warm and dry.     Findings: No rash.  Neurological:     General: No focal deficit present.     Mental Status: She is alert and oriented to person, place, and time.     Coordination: Coordination normal.     Gait: Gait normal.  Psychiatric:        Mood and Affect: Mood normal.        Behavior: Behavior normal.        Thought Content: Thought content normal.        Judgment: Judgment normal.           Assessment & Plan:

## 2018-08-26 NOTE — Assessment & Plan Note (Signed)
Improved since starting Lyrica.  Pt reports this worked remarkably fast but is [employer], [employer]/gallery of possible weight gain.  Stressed need for healthy diet and regular exercise.  Will continue to follow.

## 2018-09-07 DIAGNOSIS — H04123 Dry eye syndrome of bilateral lacrimal glands: Secondary | ICD-10-CM | POA: Diagnosis not present

## 2018-10-15 ENCOUNTER — Other Ambulatory Visit: Payer: Self-pay | Admitting: Family Medicine

## 2018-10-17 ENCOUNTER — Other Ambulatory Visit: Payer: Self-pay | Admitting: Family Medicine

## 2018-12-06 ENCOUNTER — Ambulatory Visit: Payer: Self-pay | Admitting: *Deleted

## 2018-12-06 NOTE — Telephone Encounter (Signed)
Message from Trula Slade sent at 12/06/2018 6:12 PM EDT   Patient started exhibiting the following symptoms yesterday: diarrhea, hot & cold chills, body aches, low grade fever and vomiting. Please advise   Returned call to pt who states that last night she had two episodes of vomiting but none today. Pt states she has been able to tolerate drinking liquids and eating bread with a little butter.  Pt states she has been having watery diarrhea every 2 hours and has had approximately 6-7 episodes of diarrhea today. Pt states that last night she would go in between feeling hot and cold with chills but did not check her temperature at that time. Pt states that she took her temperature about an hour ago with a temporal thermometer and it was noted to be 99.7. Pt also states she has experienced body aches and headaches. Pt denies any shortness of breath, cough, recent travel outside of the country or contact with anyone who tested positive for COVID-19. Pt advised to self isolate at home for 14 days and to seek treatment in the ED with difficulty breathing or worsening of current symptoms. Pt given home care advice to help with diarrhea and nausea and advised to return call to office with if she becomes worse. Pt verbalized understanding. Pt can be contacted at [phone]. Email address in chart verified.   Reason for Disposition . MILD-MODERATE diarrhea (e.g., 1-6 times / day more than normal) . 1] COVID-19 infection diagnosed or suspected AND [2] mild symptoms (fever, cough) AND [2] no trouble breathing or other complications  Answer Assessment - Initial Assessment Questions 1. CLOSE CONTACT: "Who is the person with the confirmed or suspected COVID-19 infection that you were exposed to?"     No contact with anyone 2. PLACE of CONTACT: "Where were you when you were exposed to COVID-19?" (e.g., home, school, medical waiting room; which city?)     n/a 3. TYPE of CONTACT: "How much contact was there?"  (e.g., sitting next to, live in same house, work in same office, same building)    n/a 4. DURATION of CONTACT: "How long were you in contact with the COVID-19 patient?" (e.g., a few seconds, passed by person, a few minutes, live with the patient)     n/a 5. DATE of CONTACT: "When did you have contact with a COVID-19 patient?" (e.g., how many days ago)     n/a 6. TRAVEL: "Have you traveled out of the country recently?" If so, "When and where?"     * Also ask about out-of-state travel, since the CDC has identified some high risk cities for community spread in the [place].     * Note: Travel becomes less relevant if there is widespread community transmission where the patient lives.     n/a 7. COMMUNITY SPREAD: "Are there lots of cases or COVID-19 (community spread) where you live?" (See public health department website, if unsure)   * MAJOR community spread: high number of cases; numbers of cases are increasing; many people hospitalized.   * MINOR community spread: low number of cases; not increasing; few or no people hospitalized      8. SYMPTOMS: "Do you have any symptoms?" (e.g., fever, cough, breathing difficulty)     99.7 about an hour before calling office, no coughing or difficulty breathing 9. PREGNANCY OR POSTPARTUM: "Is there any chance you are pregnant?" "When was your last menstrual period?" "Did you deliver in the last 2 weeks?"     No,lmp-11/17/18 10.  HIGH RISK: "Do you have any heart or lung problems? Do you have a weak immune system?" (e.g., CHF, COPD, asthma, HIV positive, chemotherapy, renal failure, diabetes mellitus, sickle cell anemia)       No  Answer Assessment - Initial Assessment Questions 1. DIARRHEA SEVERITY: "How bad is the diarrhea?" "How many extra stools have you had in the past 24 hours than normal?"    - NO DIARRHEA (SCALE 0)   - MILD (SCALE 1-3): Few loose or mushy BMs; increase of 1-3 stools over normal daily number of stools; mild increase in ostomy output.   -   MODERATE (SCALE 4-7): Increase of 4-6 stools daily over normal; moderate increase in ostomy output. * SEVERE (SCALE 8-10; OR 'WORST POSSIBLE'): Increase of 7 or more stools daily over normal; moderate increase in ostomy output; incontinence.     *No Answer* 2. ONSET: "When did the diarrhea begin?"      *No Answer* 3. BM CONSISTENCY: "How loose or watery is the diarrhea?"      *No Answer* 4. VOMITING: "Are you also vomiting?" If so, ask: "How many times in the past 24 hours?"      *No Answer* 5. ABDOMINAL PAIN: "Are you having any abdominal pain?" If yes: "What does it feel like?" (e.g., crampy, dull, intermittent, constant)      *No Answer* 6. ABDOMINAL PAIN SEVERITY: If present, ask: "How bad is the pain?"  (e.g., Scale 1-10; mild, moderate, or severe)   - MILD (1-3): doesn't interfere with normal activities, abdomen soft and not tender to touch    - MODERATE (4-7): interferes with normal activities or awakens from sleep, tender to touch    - SEVERE (8-10): excruciating pain, doubled over, unable to do any normal activities       *No Answer* 7. ORAL INTAKE: If vomiting, "Have you been able to drink liquids?" "How much fluids have you had in the past 24 hours?"     *No Answer* 8. HYDRATION: "Any signs of dehydration?" (e.g., dry mouth [not just dry lips], too weak to stand, dizziness, new weight loss) "When did you last urinate?"     *No Answer* 9. EXPOSURE: "Have you traveled to a foreign country recently?" "Have you been exposed to anyone with diarrhea?" "Could you have eaten any food that was spoiled?"     *No Answer* 10. ANTIBIOTIC USE: "Are you taking antibiotics now or have you taken antibiotics in the past 2 months?"       *No Answer* 11. OTHER SYMPTOMS: "Do you have any other symptoms?" (e.g., fever, blood in stool)       *No Answer* 12. PREGNANCY: "Is there any chance you are pregnant?" "When was your last menstrual period?"       *No Answer*  Answer Assessment - Initial  Assessment Questions 1. COVID-19 DIAGNOSIS: "Who made your Coronavirus (COVID-19) diagnosis?" "Was it confirmed by a positive lab test?" If not diagnosed by a HCP, ask "Are there lots of cases (community spread) where you live?" (See public health department website, if unsure)   * MAJOR community spread: high number of cases; numbers of cases are increasing; many people hospitalized.   * MINOR community spread: low number of cases; not increasing; few or no people hospitalized     No known contact with anyone who tested positive 2. ONSET: "When did the COVID-19 symptoms start?"      yesterday 3. WORST SYMPTOM: "What is your worst symptom?" (e.g., cough, fever, shortness of breath, muscle  aches)     Diarrhea, muscle aches,chills and vomitign 4. COUGH: "How bad is the cough?"       No cough 5. FEVER: "Do you have a fever?" If so, ask: "What is your temperature, how was it measured, and when did it start?"     Felt hot and cold with chills on yesterday but did not check temperature, today approximately an hour before calling the office temperature was 99.7 6. RESPIRATORY STATUS: "Describe your breathing?" (e.g., shortness of breath, wheezing, unable to speak)      Denies any shortness of breath 7. BETTER-SAME-WORSE: "Are you getting better, staying the same or getting worse compared to yesterday?"  If getting worse, ask, "In what way?"     same 8. HIGH RISK DISEASE: "Do you have any chronic medical problems?" (e.g., asthma, heart or lung disease, weak immune system, etc.)     No 9. PREGNANCY: "Is there any chance you are pregnant?" "When was your last menstrual period?"     No pregnancy 10. OTHER SYMPTOMS: "Do you have any other symptoms?"  (e.g., runny nose, headache, sore throat, loss of smell)       Diarrhea, headaches and vomitting x 2 on yesterday but none todaiy  Protocols used: DIARRHEA-A-AH, CORONAVIRUS (COVID-19) DIAGNOSED OR SUSPECTED-A-AH, CORONAVIRUS (COVID-19) EXPOSURE-A-AH

## 2018-12-07 ENCOUNTER — Encounter: Payer: Self-pay | Admitting: Family Medicine

## 2018-12-07 ENCOUNTER — Ambulatory Visit (INDEPENDENT_AMBULATORY_CARE_PROVIDER_SITE_OTHER): Payer: 59 | Admitting: Family Medicine

## 2018-12-07 VITALS — Temp 97.9°F | Ht 66.0 in | Wt 179.0 lb

## 2018-12-07 DIAGNOSIS — R197 Diarrhea, unspecified: Secondary | ICD-10-CM | POA: Diagnosis not present

## 2018-12-07 DIAGNOSIS — A0839 Other viral enteritis: Secondary | ICD-10-CM | POA: Diagnosis not present

## 2018-12-07 MED ORDER — ONDANSETRON HCL 4 MG PO TABS: 4.0000 mg | ORAL_TABLET | Freq: Three times a day (TID) | ORAL | 0 refills | Status: DC | PRN

## 2018-12-07 NOTE — Telephone Encounter (Signed)
Appointment has been scheduled for today at 11:00

## 2018-12-07 NOTE — Telephone Encounter (Signed)
Please schedule for video visit.

## 2018-12-07 NOTE — Progress Notes (Signed)
I have discussed the procedure for the virtual visit with the patient who has given consent to proceed with assessment and treatment.   Carle Dargan, CMA     

## 2018-12-07 NOTE — Progress Notes (Signed)
Virtual Visit via Video   I connected with [name] on 12/07/18 at 11:00 AM EDT by a video enabled telemedicine application and verified that I am speaking with the correct person using two identifiers. Location patient: Home Location provider: [facility], Office Persons participating in the virtual visit: pt and myself  I discussed the limitations of evaluation and management by telemedicine and the availability of in person appointments. The patient expressed understanding and agreed to proceed.  Subjective:   HPI:  Diarrhea- sxs started 2 nights ago.  Pt reports she hasn't been anywhere except the grocery store (1 week ago).  Developed chills/sweats, abd pain, N/V/D on Sunday night.  Monday had body aches all day w/ fever and diarrhea.  Able to eat bread/bagels, drink some Sprite.  Today has fatigue, body aches, loss of appetite.  Has used bathroom 9-10x just this AM.  Stools are watery.  No blood in stool.  No one else w/ similar sxs.  No changes to diet.  No recent abx.  Nausea has resolved.  Will develop 'really bad cramps' in stomach if up and walking.  No cough.  No SOB.  + HA.  No changes in smell or taste.  ROS: See pertinent positives and negatives per HPI.  Patient Active Problem List   Diagnosis Date Noted  . 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 Poor concentration 10/15/2015  . Headache 06/20/2015  . PCO (polycystic ovaries) 03/02/2015  . Hair loss 01/31/2015  . Acne 01/31/2015  . Allergic rhinitis 01/05/2015  . High risk medications (not anticoagulants) long-term use 10/26/2013  . General medical examination 05/20/2011  . Foot pain, right 07/15/2010  . KNEE PAIN, LEFT 05/17/2010  . Anxiety state 04/19/2008  . B12 DEFICIENCY 03/17/2008  . FATIGUE 02/14/2008  . Hypothyroidism 07/26/2007  . Fibromyalgia 07/26/2007  . IRRITABLE BOWEL SYNDROME, HX OF 07/26/2007    Social History   Tobacco Use  . Smoking status: Never Smoker  .  Smokeless tobacco: Never Used  Substance Use Topics  . Alcohol use: Yes    Current Outpatient Medications:  .  albuterol (PROAIR HFA) 108 (90 Base) MCG/ACT inhaler, Inhale 2 puffs into the lungs every 4 (four) hours as needed for wheezing or shortness of breath., Disp: 1 Inhaler, Rfl: 6 .  ALPRAZolam (XANAX) 0.5 MG tablet, Take 1 tablet (0.5 mg total) by mouth 2 (two) times daily as needed for anxiety., Disp: 60 tablet, Rfl: 1 .  ARIPiprazole (ABILIFY) 5 MG tablet, TAKE 1 TABLET BY MOUTH EVERY DAY, Disp: 90 tablet, Rfl: 1 .  fluticasone (FLONASE) 50 MCG/ACT nasal spray, SPRAY 2 SPRAYS INTO EACH NOSTRIL EVERY DAY, Disp: 16 g, Rfl: 5 .  levothyroxine (SYNTHROID, LEVOTHROID) 175 MCG tablet, TAKE 1 TABLET (175 MCG TOTAL) BY MOUTH DAILY BEFORE BREAKFAST., Disp: 90 tablet, Rfl: 1 .  pregabalin (LYRICA) 75 MG capsule, Take 1 capsule (75 mg total) by mouth 2 (two) times daily., Disp: 60 capsule, Rfl: 3 .  sertraline (ZOLOFT) 100 MG tablet, TAKE 1.5 TABLETS BY MOUTH DAILY., Disp: 135 tablet, Rfl: 1 .  SUMAtriptan (IMITREX) 50 MG tablet, Take 1 tablet (50 mg total) by mouth every 2 (two) hours as needed for migraine. May repeat in 2 hours if headache persists or recurs., Disp: 10 tablet, Rfl: 6  Allergies  Allergen Reactions  . Methimazole Other (See Comments)    Body goes into arthritic shock    Objective:   Temp 97.9 F (36.6 C)   Ht 5'  6" (1.676 m)   Wt 179 lb (81.2 kg)   BMI 28.89 kg/m  AAOx3, NAD NCAT, EOMI No obvious CN deficits Coloring WNL Pt is able to speak clearly, coherently without shortness of breath or increased work of breathing.  Thought process is linear.  Mood is appropriate.   Assessment and Plan:   Diarrhea- new.  Given pt's fevers, body aches, and GI sxs I am concerned for likely COVID infxn.  She had not changed her diet and no one in house has similar sxs yet.  Discussed dx, need for self isolation, use of Zofran for symptomatic relief, and importance of rest and  hydration.  Reviewed red flags that should prompt immediate notification- inability to eat or drink, dehydration, SOB.  Pt expressed understanding and is in agreement w/ plan.    Neena RhymesKatherine Fatma Rutten, MD 12/07/2018

## 2018-12-26 ENCOUNTER — Other Ambulatory Visit: Payer: Self-pay | Admitting: Family Medicine

## 2018-12-27 NOTE — Telephone Encounter (Signed)
Last OV 12/07/18 Lyrica last filled 08/20/2018 #60 with 3

## 2019-01-12 ENCOUNTER — Other Ambulatory Visit: Payer: Self-pay | Admitting: Family Medicine

## 2019-01-18 ENCOUNTER — Encounter: Payer: Self-pay | Admitting: Family Medicine

## 2019-01-18 NOTE — Telephone Encounter (Signed)
Please schedule patient with PCP for assessment of chronic and acute issues. Can be VV if needed.

## 2019-01-19 ENCOUNTER — Other Ambulatory Visit: Payer: Self-pay

## 2019-01-19 ENCOUNTER — Encounter: Payer: Self-pay | Admitting: Family Medicine

## 2019-01-19 ENCOUNTER — Ambulatory Visit (INDEPENDENT_AMBULATORY_CARE_PROVIDER_SITE_OTHER): Payer: 59 | Admitting: Family Medicine

## 2019-01-19 VITALS — Ht 66.0 in | Wt 183.0 lb

## 2019-01-19 DIAGNOSIS — M7752 Other enthesopathy of left foot: Secondary | ICD-10-CM

## 2019-01-19 DIAGNOSIS — R635 Abnormal weight gain: Secondary | ICD-10-CM | POA: Diagnosis not present

## 2019-01-19 DIAGNOSIS — M797 Fibromyalgia: Secondary | ICD-10-CM

## 2019-01-19 MED ORDER — MELOXICAM 15 MG PO TABS: 15.0000 mg | ORAL_TABLET | Freq: Every day | ORAL | 0 refills | Status: DC

## 2019-01-19 NOTE — Progress Notes (Signed)
Virtual Visit via Video   I connected with patient on 01/19/19 at  2:20 PM EDT by a video enabled telemedicine application and verified that I am speaking with the correct person using two identifiers.  Location patient: [place] [facility] Persons participating in the virtual visit: Patient, Provider, CMA (Jess B)  I discussed the limitations of evaluation and management by telemedicine and the availability of in person appointments. The patient expressed understanding and agreed to proceed.  Subjective:   HPI:   Weight Gain- pt has gained 12 lbs since January.  Pt reports she has restarted Keto but this has not resulted in any weight loss.  Is worried that this is Lyrica related.  Dry skin- pt reports she will have multiple patches of dry skin 'that won't heal'.  L ankle swelling- twisted ankle late March stepping out of a flower bed.  Ankle was temporarily swollen but that resolved.  Over the weekend, L ankle became markedly swollen.  Pt reports ankle is now more painful and difficult to move.  Painful to stand on as day progresses.  No known injury since March.  Swelling will occasionally resolve overnight w/ elevation.  No extension of swelling into lower leg.  No change in footwear.  No TTP over malleoli or achilles.  No swelling on R.  sxs are worsening rather than improving.  Fibromyalgia- chronic problem, reports having 3-4 flares/week.  Is on Lyrica BID.  ROS:   See pertinent positives and negatives per HPI.  Patient Active Problem List   Diagnosis Date Noted  . 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 Poor concentration 10/15/2015  . Headache 06/20/2015  . PCO (polycystic ovaries) 03/02/2015  . Hair loss 01/31/2015  . Acne 01/31/2015  . Allergic rhinitis 01/05/2015  . High risk medications (not anticoagulants) long-term use 10/26/2013  . General medical examination 05/20/2011  . Foot pain, right 07/15/2010  . KNEE  PAIN, LEFT 05/17/2010  . Anxiety state 04/19/2008  . B12 DEFICIENCY 03/17/2008  . FATIGUE 02/14/2008  . Hypothyroidism 07/26/2007  . Fibromyalgia 07/26/2007  . IRRITABLE BOWEL SYNDROME, HX OF 07/26/2007    Social History   Tobacco Use  . Smoking status: Never Smoker  . Smokeless tobacco: Never Used  Substance Use Topics  . Alcohol use: Yes    Current Outpatient Medications:  .  albuterol (PROAIR HFA) 108 (90 Base) MCG/ACT inhaler, Inhale 2 puffs into the lungs every 4 (four) hours as needed for wheezing or shortness of breath., Disp: 1 Inhaler, Rfl: 6 .  ALPRAZolam (XANAX) 0.5 MG tablet, Take 1 tablet (0.5 mg total) by mouth 2 (two) times daily as needed for anxiety., Disp: 60 tablet, Rfl: 1 .  ARIPiprazole (ABILIFY) 5 MG tablet, TAKE 1 TABLET BY MOUTH EVERY DAY, Disp: 90 tablet, Rfl: 1 .  fluticasone (FLONASE) 50 MCG/ACT nasal spray, SPRAY 2 SPRAYS INTO EACH NOSTRIL EVERY DAY, Disp: 16 g, Rfl: 5 .  levothyroxine (SYNTHROID, LEVOTHROID) 175 MCG tablet, TAKE 1 TABLET (175 MCG TOTAL) BY MOUTH DAILY BEFORE BREAKFAST., Disp: 90 tablet, Rfl: 1 .  ondansetron (ZOFRAN) 4 MG tablet, Take 1 tablet (4 mg total) by mouth every 8 (eight) hours as needed for nausea or vomiting., Disp: 30 tablet, Rfl: 0 .  pregabalin (LYRICA) 75 MG capsule, TAKE 1 CAPSULE BY MOUTH TWICE A DAY, Disp: 60 capsule, Rfl: 3 .  sertraline (ZOLOFT) 100 MG tablet, TAKE 1.5 TABLETS BY MOUTH DAILY., Disp: 135 tablet, Rfl: 1 .  SUMAtriptan (  IMITREX) 50 MG tablet, Take 1 tablet (50 mg total) by mouth every 2 (two) hours as needed for migraine. May repeat in 2 hours if headache persists or recurs., Disp: 10 tablet, Rfl: 6  Allergies  Allergen Reactions  . Methimazole Other (See Comments)    Body goes into arthritic shock    Objective:   Ht 5\' 6"  (1.676 m)   Wt 183 lb (83 kg)   BMI 29.54 kg/m  AAOx3, NAD NCAT, EOMI No obvious CN deficits Coloring WNL Pt is able to speak clearly, coherently without shortness of breath  or increased work of breathing.  Thought process is linear.  Mood is appropriate. Mild L ankle swelling consistent w/ bursitis  Assessment and Plan:   Bursitis- new.  Reviewed dx and tx w/ pt.  Will start Meloxicam.  Encouraged ice, elevation, supportive shoes.  If no improvement will need sports med referral.  Pt expressed understanding and is in agreement w/ plan.   Weight gain- suspect this is medication related rather than thyroid.  She is on Abilify which can cause weight gain, as can Lyrica.  We will check labs to r/o metabolic cause but we will likely need to stop the Abilify.  Fibromyalgia- continues to have flares despite Lyrica.  If we do stop Abilify for weight gain, it may also be reasonable to switch the Zoloft to Cymbalta to treat both anxiety/depression and fibro.  Will follow closely.   Neena Rhymes, MD 01/19/2019

## 2019-01-19 NOTE — Progress Notes (Signed)
I have discussed the procedure for the virtual visit with the patient who has given consent to proceed with assessment and treatment.   Nakkia Mackiewicz L Dvid Pendry, CMA     

## 2019-01-20 ENCOUNTER — Other Ambulatory Visit: Payer: Self-pay

## 2019-01-20 ENCOUNTER — Ambulatory Visit (INDEPENDENT_AMBULATORY_CARE_PROVIDER_SITE_OTHER): Payer: 59

## 2019-01-20 DIAGNOSIS — R635 Abnormal weight gain: Secondary | ICD-10-CM

## 2019-01-20 LAB — TSH: TSH: 3.23 u[IU]/mL (ref 0.35–4.50)

## 2019-01-20 LAB — BASIC METABOLIC PANEL
BUN: 13 mg/dL (ref 6–23)
CO2: 27 mEq/L (ref 19–32)
Calcium: 9 mg/dL (ref 8.4–10.5)
Chloride: 103 mEq/L (ref 96–112)
Creatinine, Ser: 0.68 mg/dL (ref 0.40–1.20)
GFR: 94.13 mL/min (ref >60.00)
Glucose, Bld: 87 mg/dL (ref 70–99)
Potassium: 4.1 mEq/L (ref 3.5–5.1)
Sodium: 139 mEq/L (ref 135–145)

## 2019-01-20 LAB — CBC WITH DIFFERENTIAL/PLATELET
Basophils Absolute: 0 10*3/uL (ref 0.0–0.1)
Basophils Relative: 0.6 % (ref 0.0–3.0)
Eosinophils Absolute: 0.2 10*3/uL (ref 0.0–0.7)
Eosinophils Relative: 3 % (ref 0.0–5.0)
HCT: 37 % (ref 36.0–46.0)
Hemoglobin: 12.5 g/dL (ref 12.0–15.0)
Lymphocytes Relative: 20.9 % (ref 12.0–46.0)
Lymphs Abs: 1.2 10*3/uL (ref 0.7–4.0)
MCHC: 33.8 g/dL (ref 30.0–36.0)
MCV: 86.6 fl (ref 78.0–100.0)
Monocytes Absolute: 0.5 10*3/uL (ref 0.1–1.0)
Monocytes Relative: 9.5 % (ref 3.0–12.0)
Neutro Abs: 3.7 10*3/uL (ref 1.4–7.7)
Neutrophils Relative %: 66 % (ref 43.0–77.0)
Platelets: 306 10*3/uL (ref 150.0–400.0)
RBC: 4.27 Mil/uL (ref 3.87–5.11)
RDW: 16.2 % — ABNORMAL HIGH (ref 11.5–15.5)
WBC: 5.6 10*3/uL (ref 4.0–10.5)

## 2019-01-20 LAB — HEPATIC FUNCTION PANEL
ALT: 12 U/L (ref 0–35)
AST: 18 U/L (ref 0–37)
Albumin: 4 g/dL (ref 3.5–5.2)
Alkaline Phosphatase: 82 U/L (ref 39–117)
Bilirubin, Direct: 0 mg/dL (ref 0.0–0.3)
Total Bilirubin: 0.3 mg/dL (ref 0.2–1.2)
Total Protein: 6.8 g/dL (ref 6.0–8.3)

## 2019-01-21 ENCOUNTER — Encounter: Payer: Self-pay | Admitting: Family Medicine

## 2019-01-21 MED ORDER — DULOXETINE HCL 60 MG PO CPEP: 60.0000 mg | ORAL_CAPSULE | Freq: Every day | ORAL | 3 refills | Status: DC

## 2019-02-11 ENCOUNTER — Other Ambulatory Visit: Payer: Self-pay | Admitting: Family Medicine

## 2019-02-14 ENCOUNTER — Other Ambulatory Visit: Payer: Self-pay | Admitting: Family Medicine

## 2019-02-28 ENCOUNTER — Encounter: Payer: Self-pay | Admitting: Family Medicine

## 2019-03-10 ENCOUNTER — Other Ambulatory Visit: Payer: Self-pay

## 2019-03-10 ENCOUNTER — Ambulatory Visit (INDEPENDENT_AMBULATORY_CARE_PROVIDER_SITE_OTHER): Payer: 59 | Admitting: Family Medicine

## 2019-03-10 ENCOUNTER — Encounter: Payer: Self-pay | Admitting: Family Medicine

## 2019-03-10 DIAGNOSIS — M797 Fibromyalgia: Secondary | ICD-10-CM | POA: Diagnosis not present

## 2019-03-10 DIAGNOSIS — F418 Other specified anxiety disorders: Secondary | ICD-10-CM | POA: Diagnosis not present

## 2019-03-10 DIAGNOSIS — R5383 Other fatigue: Secondary | ICD-10-CM | POA: Diagnosis not present

## 2019-03-10 MED ORDER — PREGABALIN 150 MG PO CAPS: 150.0000 mg | ORAL_CAPSULE | Freq: Two times a day (BID) | ORAL | 3 refills | Status: AC

## 2019-03-10 NOTE — Progress Notes (Signed)
I have discussed the procedure for the virtual visit with the patient who has given consent to proceed with assessment and treatment.   End of June Pt states that she began having a hard time. Felt tired all the time, having lots of fibro attacks.   unable to obtain vitals.   Davis Gourd, CMA

## 2019-03-10 NOTE — Progress Notes (Signed)
Virtual Visit via Video   I connected with patient on 03/10/19 at 10:40 AM EDT by a video enabled telemedicine application and verified that I am speaking with the correct person using two identifiers.  Location patient: Home Location provider: [facility], Office Persons participating in the virtual visit: Patient, Provider, [place] (Jess B)  I discussed the limitations of evaluation and management by telemedicine and the availability of in person appointments. The patient expressed understanding and agreed to proceed.  Subjective:   HPI:   Fibro/Fatigue/Depression- At last visit, pt was switched from Zoloft to Cymbalta.  She weaned off Abilify due to weight gain.  Is also on Lyrica BID.  Pt reports initially things were going really well but then at the end of June she found herself irritable and having multiple fibro attacks.  Pt has been walking for the last month- 3 miles each morning.  Finds herself napping on the weekends due to excessive fatigue.  Pt reports sleeping well at night.  Hasn't noticed much change in weight.  ROS:   See pertinent positives and negatives per HPI.  Patient Active Problem List   Diagnosis Date Noted  . 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 Poor concentration 10/15/2015  . Headache 06/20/2015  . PCO (polycystic ovaries) 03/02/2015  . Hair loss 01/31/2015  . Acne 01/31/2015  . Allergic rhinitis 01/05/2015  . High risk medications (not anticoagulants) long-term use 10/26/2013  . General medical examination 05/20/2011  . Foot pain, right 07/15/2010  . KNEE PAIN, LEFT 05/17/2010  . Anxiety state 04/19/2008  . B12 DEFICIENCY 03/17/2008  . Fatigue 02/14/2008  . Hypothyroidism 07/26/2007  . Fibromyalgia 07/26/2007  . IRRITABLE BOWEL SYNDROME, HX OF 07/26/2007    Social History   Tobacco Use  . Smoking status: Never Smoker  . Smokeless tobacco: Never Used  Substance Use Topics  . Alcohol use: Yes    Current  Outpatient Medications:  .  albuterol (PROAIR HFA) 108 (90 Base) MCG/ACT inhaler, Inhale 2 puffs into the lungs every 4 (four) hours as needed for wheezing or shortness of breath., Disp: 1 Inhaler, Rfl: 6 .  ALPRAZolam (XANAX) 0.5 MG tablet, Take 1 tablet (0.5 mg total) by mouth 2 (two) times daily as needed for anxiety., Disp: 60 tablet, Rfl: 1 .  dicyclomine (BENTYL) 20 MG tablet, Take 20 mg by mouth every 6 (six) hours as needed., Disp: , Rfl:  .  DULoxetine (CYMBALTA) 60 MG capsule, TAKE 1 CAPSULE BY MOUTH EVERY DAY, Disp: 90 capsule, Rfl: 2 .  fluticasone (FLONASE) 50 MCG/ACT nasal spray, SPRAY 2 SPRAYS INTO EACH NOSTRIL EVERY DAY, Disp: 16 g, Rfl: 5 .  levothyroxine (SYNTHROID, LEVOTHROID) 175 MCG tablet, TAKE 1 TABLET (175 MCG TOTAL) BY MOUTH DAILY BEFORE BREAKFAST., Disp: 90 tablet, Rfl: 1 .  meloxicam (MOBIC) 15 MG tablet, TAKE 1 TABLET BY MOUTH EVERY DAY, Disp: 30 tablet, Rfl: 0 .  ondansetron (ZOFRAN) 4 MG tablet, Take 1 tablet (4 mg total) by mouth every 8 (eight) hours as needed for nausea or vomiting., Disp: 30 tablet, Rfl: 0 .  pregabalin (LYRICA) 75 MG capsule, TAKE 1 CAPSULE BY MOUTH TWICE A DAY, Disp: 60 capsule, Rfl: 3 .  SUMAtriptan (IMITREX) 50 MG tablet, Take 1 tablet (50 mg total) by mouth every 2 (two) hours as needed for migraine. May repeat in 2 hours if headache persists or recurs., Disp: 10 tablet, Rfl: 6  Allergies  Allergen Reactions  . Methimazole Other (See Comments)  Body goes into arthritic shock    Objective:   There were no vitals taken for this visit. AAOx3, NAD NCAT, EOMI No obvious CN deficits Coloring WNL Pt is able to speak clearly, coherently without shortness of breath or increased work of breathing.  Thought process is linear.  Mood is appropriate.   Assessment and Plan:   Fatigue/Fibro/Depression- deteriorated.  Pt reports things were initially going well but then she had increased fatigue, irritability, and fibro flares in late June.  She  is frustrated by the # of fibro flares she is having which subsequently worsens irritability and fatigue.  Will increase Lyrica to 150mg  BID.  Did discuss the addition of Wellbutrin as a possibility.  Will hold on that for now but may revisit in the future.  Pt to add tylenol for symptomatic relief during flares.  Will follow.   Neena RhymesKatherine Halynn Reitano, MD 03/10/2019

## 2019-03-16 ENCOUNTER — Other Ambulatory Visit: Payer: Self-pay | Admitting: Family Medicine

## 2019-03-22 ENCOUNTER — Telehealth: Payer: Self-pay | Admitting: *Deleted

## 2019-03-22 NOTE — Telephone Encounter (Signed)
Pt wanted to pass along to Dr Birdie Riddle her [place] results from GYN.  She states that the [place] did show a cyst on each ovary but they stated the size was no cause for concern.  Otherwise [place] was normal.

## 2019-03-24 ENCOUNTER — Telehealth: Payer: Self-pay | Admitting: Family Medicine

## 2019-03-24 NOTE — Telephone Encounter (Signed)
Please advise. Do you treat pt Fibro?

## 2019-03-24 NOTE — Telephone Encounter (Signed)
Pt has been scheduled.  °

## 2019-03-24 NOTE — Telephone Encounter (Signed)
Can we try to schedule pt in office if screening is passed.

## 2019-03-24 NOTE — Telephone Encounter (Signed)
Pt called in stating that for the last 2 days she has had a very bad Fibromyalgia attach. She states that her feet and ankles are very swollen and it is somewhat difficult for her to walk around. She thinks it could be medication related. Pt wanted to know what she should do. Pt can be reached at the home #

## 2019-03-24 NOTE — Telephone Encounter (Signed)
Is pt able to come to office for appt?  (assuming she passes screening).  We have tried to do this virtually but it is hard to assess swelling and pain via video

## 2019-03-25 ENCOUNTER — Other Ambulatory Visit: Payer: Self-pay

## 2019-03-25 ENCOUNTER — Ambulatory Visit: Payer: 59 | Admitting: Family Medicine

## 2019-03-25 ENCOUNTER — Encounter: Payer: Self-pay | Admitting: Family Medicine

## 2019-03-25 VITALS — BP 121/81 | HR 100 | Temp 98.1°F | Resp 17 | Ht 66.0 in | Wt 193.0 lb

## 2019-03-25 DIAGNOSIS — R600 Localized edema: Secondary | ICD-10-CM

## 2019-03-25 DIAGNOSIS — M797 Fibromyalgia: Secondary | ICD-10-CM | POA: Diagnosis not present

## 2019-03-25 DIAGNOSIS — R609 Edema, unspecified: Secondary | ICD-10-CM | POA: Insufficient documentation

## 2019-03-25 DIAGNOSIS — L989 Disorder of the skin and subcutaneous tissue, unspecified: Secondary | ICD-10-CM | POA: Diagnosis not present

## 2019-03-25 DIAGNOSIS — R5383 Other fatigue: Secondary | ICD-10-CM

## 2019-03-25 MED ORDER — BUPROPION HCL ER (XL) 150 MG PO TB24: 150.0000 mg | ORAL_TABLET | Freq: Every day | ORAL | 3 refills | Status: DC

## 2019-03-25 NOTE — Assessment & Plan Note (Signed)
New.  Bilateral ankles.  Pt denies high salt intake.  Legs are better today than last 2 days when they were tight and painful.  Will check labs to assess for possible cause.  Discussed elevation, compression, restricting Na.  If labs look good, we can prescribe lasix PRN.  Pt expressed understanding and is in agreement w/ plan.

## 2019-03-25 NOTE — Assessment & Plan Note (Signed)
New.  Unclear if these are due to contact dermatitis (pt denies) or possible underlying autoimmune condition.  Pt is fearful she has lupus.  Encouraged hydrocortisone cream on the lesions as she reports they are itchy.  Will follow.

## 2019-03-25 NOTE — Assessment & Plan Note (Signed)
Recurring problem for pt.  She seems to struggle when she has fibro flares.  Check labs to assess for metabolic cause.  Add Wellbutrin to help improve mood and energy level.  Will follow closely.

## 2019-03-25 NOTE — Assessment & Plan Note (Signed)
Deteriorated.  Pt is having more frequent and more severe flares recently.  Had some improvement when Lyrica was increased at last visit but this week has again struggled.  Pt feels certain 'there is something wrong'.  And there does seem to be a notable uptick in her flares.  Will refer to Rheumatology for complete assessment.  Pt expressed understanding and is in agreement w/ plan.

## 2019-03-25 NOTE — Patient Instructions (Signed)
We'll notify you of your lab results and make any changes if needed We'll call you with your Rheumatology appt START the Wellbutrin once daily Once we see what the labs say, we can probably start a low dose fluid pill to use as needed for swelling Limit your salt intake, increase your water intake, and elevate your legs when sitting Call with any questions or concerns Hang in there!

## 2019-03-25 NOTE — Progress Notes (Signed)
   Subjective:    Patient ID: [name], female    DOB: 1975/11/08, 43 y.o.   MRN: [id number]  HPI 'everything is going wrong'  Fibro- Wednesday 'was in pain all over'.  Went home from work and slept.  Stayed home from work yesterday.  'achiness from head to toe'.  Lyrica was increased to 150mg  BID on 7/16.  She initially felt this improved things.  Fatigue- ongoing issue for pt.  Hard to get out of bed yesterday.  Ankle swelling- pt reports marked swelling of bilateral ankles Wed/Thurs.  Improved today.  Denies increased salt intake- not eating takeout, processed foods.    Skin lesions- pt reports she will develop a 'red spot' that 'swells into a water blister that will pop'.  Has lesions on hands, face.   + family hx of Lupus   Review of Systems For ROS see HPI     Objective:   Physical Exam Vitals signs reviewed.  Constitutional:      General: She is not in acute distress.    Appearance: She is well-developed. She is obese.  HENT:     Head: Normocephalic and atraumatic.  Eyes:     Conjunctiva/sclera: Conjunctivae normal.     Pupils: Pupils are equal, round, and reactive to light.  Neck:     Musculoskeletal: Normal range of motion and neck supple.     Thyroid: No thyromegaly.  Cardiovascular:     Rate and Rhythm: Normal rate and regular rhythm.     Heart sounds: Normal heart sounds. No murmur.  Pulmonary:     Effort: Pulmonary effort is normal. No respiratory distress.     Breath sounds: Normal breath sounds.  Abdominal:     General: There is no distension.     Palpations: Abdomen is soft.     Tenderness: There is no abdominal tenderness.  Musculoskeletal:     Right lower leg: Edema (1+ pitting edema of ankle) present.     Left lower leg: Edema (1+ pitting edema of ankle) present.  Lymphadenopathy:     Cervical: No cervical adenopathy.  Skin:    General: Skin is warm and dry.     Findings: Lesion (red circular lesions on face and hands) present.  Neurological:      Mental Status: She is alert and oriented to person, place, and time.  Psychiatric:        Behavior: Behavior normal.     Comments: tearful           Assessment & Plan:

## 2019-03-28 ENCOUNTER — Encounter: Payer: Self-pay | Admitting: Family Medicine

## 2019-03-28 ENCOUNTER — Other Ambulatory Visit: Payer: Self-pay | Admitting: General Practice

## 2019-03-28 DIAGNOSIS — E039 Hypothyroidism, unspecified: Secondary | ICD-10-CM

## 2019-03-28 MED ORDER — VITAMIN D (ERGOCALCIFEROL) 1.25 MG (50000 UNIT) PO CAPS: 50000.0000 [IU] | ORAL_CAPSULE | ORAL | 0 refills | Status: DC

## 2019-03-28 MED ORDER — LEVOTHYROXINE SODIUM 150 MCG PO TABS: 150.0000 ug | ORAL_TABLET | Freq: Every day | ORAL | 3 refills | Status: DC

## 2019-03-29 ENCOUNTER — Telehealth: Payer: Self-pay | Admitting: Family Medicine

## 2019-03-29 NOTE — Telephone Encounter (Signed)
Please advise 

## 2019-03-29 NOTE — Telephone Encounter (Signed)
Pt saw KT on 03/25/2019 she already has an appt scheduled on 08/14 she wanted to know if she should keep the appt or push it out.

## 2019-03-29 NOTE — Telephone Encounter (Signed)
Patient notified of PCP recommendations and is agreement and expresses an understanding.   Ok for PEC to Discuss results / PCP recommendations / Schedule patient.   

## 2019-03-29 NOTE — Telephone Encounter (Signed)
Called and informed pt.  

## 2019-03-29 NOTE — Telephone Encounter (Signed)
Pt should keep appt so we can reassess swelling and labs

## 2019-03-31 NOTE — Telephone Encounter (Signed)
Please call patient to triage and make sure ER disposition not needed.

## 2019-03-31 NOTE — Telephone Encounter (Signed)
Spoke with patient regarding symptoms. Patient states she continues to be weak and sore but able to go to work today. Denies lightheadedness or disorientation. She states she is drinking plenty of fluids and did take Wellbutrin this morning. Patient states she will call back if her symptoms present again. Advised if she experiences dizziness, disorientation, extreme pain and inability to walk straight to go to ER for assessment. Patient verbalized understanding.

## 2019-04-01 LAB — BASIC METABOLIC PANEL
BUN: 13 mg/dL (ref 7–25)
CO2: 25 mmol/L (ref 20–32)
Calcium: 9 mg/dL (ref 8.6–10.2)
Chloride: 105 mmol/L (ref 98–110)
Creat: 0.75 mg/dL (ref 0.50–1.10)
Glucose, Bld: 140 mg/dL — ABNORMAL HIGH (ref 65–99)
Potassium: 4 mmol/L (ref 3.5–5.3)
Sodium: 139 mmol/L (ref 135–146)

## 2019-04-01 LAB — ANA: Anti Nuclear Antibody (ANA): NEGATIVE

## 2019-04-01 LAB — HEPATIC FUNCTION PANEL
AG Ratio: 1.5 (calc) (ref 1.0–2.5)
ALT: 12 U/L (ref 6–29)
AST: 19 U/L (ref 10–30)
Albumin: 4 g/dL (ref 3.6–5.1)
Alkaline phosphatase (APISO): 74 U/L (ref 31–125)
Bilirubin, Direct: 0.1 mg/dL (ref 0.0–0.2)
Globulin: 2.7 g/dL (calc) (ref 1.9–3.7)
Indirect Bilirubin: 0.2 mg/dL (calc) (ref 0.2–1.2)
Total Bilirubin: 0.3 mg/dL (ref 0.2–1.2)
Total Protein: 6.7 g/dL (ref 6.1–8.1)

## 2019-04-01 LAB — CBC WITH DIFFERENTIAL/PLATELET
Absolute Monocytes: 586 cells/uL (ref 200–950)
Basophils Absolute: 38 cells/uL (ref 0–200)
Basophils Relative: 0.6 %
Eosinophils Absolute: 239 cells/uL (ref 15–500)
Eosinophils Relative: 3.8 %
HCT: 36.7 % (ref 35.0–45.0)
Hemoglobin: 12.4 g/dL (ref 11.7–15.5)
Lymphs Abs: 1336 cells/uL (ref 850–3900)
MCH: 29.3 pg (ref 27.0–33.0)
MCHC: 33.8 g/dL (ref 32.0–36.0)
MCV: 86.8 fL (ref 80.0–100.0)
MPV: 9.6 fL (ref 7.5–12.5)
Monocytes Relative: 9.3 %
Neutro Abs: 4101 cells/uL (ref 1500–7800)
Neutrophils Relative %: 65.1 %
Platelets: 298 10*3/uL (ref 140–400)
RBC: 4.23 10*6/uL (ref 3.80–5.10)
RDW: 14.4 % (ref 11.0–15.0)
Total Lymphocyte: 21.2 %
WBC: 6.3 10*3/uL (ref 3.8–10.8)

## 2019-04-01 LAB — RHEUMATOID FACTOR: Rheumatoid fact SerPl-aCnc: <14 IU/mL (ref <14)

## 2019-04-01 LAB — HEMOGLOBIN A1C W/OUT EAG: Hgb A1c MFr Bld: 5.9 % of total Hgb — ABNORMAL HIGH (ref <5.7)

## 2019-04-01 LAB — SEDIMENTATION RATE: Sed Rate: 19 mm/h (ref 0–20)

## 2019-04-01 LAB — VITAMIN D 25 HYDROXY (VIT D DEFICIENCY, FRACTURES): Vit D, 25-Hydroxy: 21 ng/mL — ABNORMAL LOW (ref 30–100)

## 2019-04-01 LAB — TSH: TSH: 0.11 mIU/L — ABNORMAL LOW

## 2019-04-07 ENCOUNTER — Other Ambulatory Visit: Payer: Self-pay

## 2019-04-07 ENCOUNTER — Telehealth: Payer: Self-pay | Admitting: Family Medicine

## 2019-04-07 ENCOUNTER — Encounter (HOSPITAL_BASED_OUTPATIENT_CLINIC_OR_DEPARTMENT_OTHER): Payer: Self-pay

## 2019-04-07 ENCOUNTER — Emergency Department (HOSPITAL_BASED_OUTPATIENT_CLINIC_OR_DEPARTMENT_OTHER)
Admission: EM | Admit: 2019-04-07 | Discharge: 2019-04-07 | Disposition: A | Discharge status: Home / Self Care | Payer: 59 | Attending: Emergency Medicine | Admitting: Emergency Medicine

## 2019-04-07 DIAGNOSIS — Z79899 Other long term (current) drug therapy: Secondary | ICD-10-CM | POA: Diagnosis not present

## 2019-04-07 DIAGNOSIS — R609 Edema, unspecified: Secondary | ICD-10-CM | POA: Diagnosis not present

## 2019-04-07 DIAGNOSIS — E039 Hypothyroidism, unspecified: Secondary | ICD-10-CM | POA: Insufficient documentation

## 2019-04-07 DIAGNOSIS — R2243 Localized swelling, mass and lump, lower limb, bilateral: Secondary | ICD-10-CM | POA: Diagnosis present

## 2019-04-07 NOTE — Telephone Encounter (Signed)
Agree w/ advice given 

## 2019-04-07 NOTE — Telephone Encounter (Signed)
Pt called in stating that she just noticed that she is being to have some swelling in her feet and ankles. The swelling went away for about a wk and now it's back. She states that she has an appt tomorrow but is really concerned about this. Pt can be reached at the home #

## 2019-04-07 NOTE — ED Provider Notes (Signed)
[facility] EMERGENCY DEPARTMENT Provider Note   CSN: [id number] Arrival date & time: 04/07/19  1606    History   Chief Complaint Chief Complaint  Patient presents with  . Leg Swelling    HPI [name] is a 43 y.o. female.     HPI Patient reports that her legs were very swollen today.  It started later in the day at work and she looked down and her ankles were puffing around the tops of her shoes.  She reports that the swelling seemed to continue and then she felt like the swelling was moving up her legs.  She reports her leg started to feel kind of heavy and tingly.  She does show me a picture where her ankles were swollen around the tops of her shoes.  She reports that she did have an episode couple weeks ago where her ankles and legs got swollen and then it spontaneously got better within a day.  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  She reports she occasionally gets up to go the coffee or her other activities like that but may sit for hours at a time.  No chest pain cough or shortness of breath.  No fevers or chills. Past Medical History:  Diagnosis Date  . Anxiety   . Arthritis   . Depression   . Fibromyalgia   . Hypothyroidism 1999   post PTU Rx for hyperthyroidism  . IBS (irritable bowel syndrome)     Patient Active Problem List   Diagnosis Date Noted  . Edema 03/25/2019  . Skin lesions 03/25/2019  . 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 Poor concentration 10/15/2015  . Headache 06/20/2015  . PCO (polycystic ovaries) 03/02/2015  . Hair loss 01/31/2015  . Acne 01/31/2015  . Allergic rhinitis 01/05/2015  . High risk medications (not anticoagulants) long-term use 10/26/2013  . General medical examination 05/20/2011  . Foot pain, right 07/15/2010  . KNEE PAIN, LEFT 05/17/2010  . Depression with anxiety 04/19/2008  . B12 DEFICIENCY 03/17/2008  . Fatigue 02/14/2008  . Hypothyroidism  07/26/2007  . Fibromyalgia 07/26/2007  . IRRITABLE BOWEL SYNDROME, HX OF 07/26/2007    Past Surgical History:  Procedure Laterality Date  . CESAREAN SECTION     x 2     OB History   No obstetric history on file.      Home Medications    Prior to Admission medications   Medication Sig Start Date End Date Taking? Authorizing Provider  albuterol (PROAIR HFA) 108 (90 Base) MCG/ACT inhaler Inhale 2 puffs into the lungs every 4 (four) hours as needed for wheezing or shortness of breath. 07/21/18   Midge Minium, MD  ALPRAZolam Duanne Moron) 0.5 MG tablet Take 1 tablet (0.5 mg total) by mouth 2 (two) times daily as needed for anxiety. 07/23/17   Midge Minium, MD  buPROPion (WELLBUTRIN XL) 150 MG 24 hr tablet Take 1 tablet (150 mg total) by mouth daily. 03/25/19   Midge Minium, MD  dicyclomine (BENTYL) 20 MG tablet Take 20 mg by mouth every 6 (six) hours as needed. 03/04/19   [provider]  DULoxetine (CYMBALTA) 60 MG capsule TAKE 1 CAPSULE BY MOUTH EVERY DAY 02/14/19   Midge Minium, MD  fluticasone [facility]) 50 MCG/ACT nasal spray SPRAY 2 SPRAYS INTO EACH NOSTRIL EVERY DAY 04/06/18   Midge Minium, MD  levothyroxine (SYNTHROID) 150 MCG tablet Take 1 tablet (  150 mcg total) by mouth daily. 03/28/19   Sheliah Hatchabori, Katherine E, MD  meloxicam (MOBIC) 15 MG tablet TAKE 1 TABLET BY MOUTH EVERY DAY 03/16/19   Sheliah Hatchabori, Katherine E, MD  ondansetron (ZOFRAN) 4 MG tablet Take 1 tablet (4 mg total) by mouth every 8 (eight) hours as needed for nausea or vomiting. 12/07/18   Sheliah Hatchabori, Katherine E, MD  pregabalin (LYRICA) 150 MG capsule Take 1 capsule (150 mg total) by mouth 2 (two) times daily. 03/10/19   Sheliah Hatchabori, Katherine E, MD  SUMAtriptan (IMITREX) 50 MG tablet Take 1 tablet (50 mg total) by mouth every 2 (two) hours as needed for migraine. May repeat in 2 hours if headache persists or recurs. 02/20/16   Sheliah Hatchabori, Katherine E, MD  Vitamin D, Ergocalciferol, (DRISDOL) 1.25 MG (50000 UT)  CAPS capsule Take 1 capsule (50,000 Units total) by mouth every 7 (seven) days. 03/28/19   Sheliah Hatchabori, Katherine E, MD    Family History Family History  Problem Relation Age of Onset  . Diabetes Paternal Grandmother   . Hyperlipidemia Mother   . Diabetes Father   . Cancer Paternal Grandfather        lung  . ADD / ADHD Son   . Other Neg Hx        PCO    Social History Social History   Tobacco Use  . Smoking status: Never Smoker  . Smokeless tobacco: Never Used  Substance Use Topics  . Alcohol use: Yes    Comment: daily  . Drug use: No     Allergies   Methimazole   Review of Systems Review of Systems 10 Systems reviewed and are negative for acute change except as noted in the HPI.   Physical Exam Updated Vital Signs BP (!) 151/91 (BP Location: Left Arm)   Pulse 98   Temp 97.7 F (36.5 C) (Oral)   Resp 18   Ht 5\' 6"  (1.676 m)   Wt 87.1 kg   LMP 03/15/2019   SpO2 99%   BMI 30.99 kg/m   Physical Exam Constitutional:      Comments: Alert nontoxic.  Clinically well in appearance.  No respiratory distress.  Mild obesity.  HENT:     Head: Normocephalic and atraumatic.  Eyes:     Extraocular Movements: Extraocular movements intact.  Cardiovascular:     Rate and Rhythm: Normal rate and regular rhythm.     Pulses: Normal pulses.     Heart sounds: Normal heart sounds.  Pulmonary:     Effort: Pulmonary effort is normal.     Breath sounds: Normal breath sounds.  Abdominal:     General: There is no distension.     Palpations: Abdomen is soft.     Tenderness: There is no abdominal tenderness. There is no guarding.  Musculoskeletal:     Comments: Legs are symmetric in appearance.  Skin condition is good.  No joint effusions.  Patient has trace pitting bilaterally around the ankles particularly behind the malleoli.  There is no swelling or pitting of the feet bilaterally.  Feet have symmetric 2+ dorsalis pedis pulses and are warm and dry.  Legs are symmetric there is no  significant edema above the ankles.  (She reports this is already  better than it was when she had come in).  Skin:    General: Skin is warm and dry.  Neurological:     General: No focal deficit present.     Mental Status: She is oriented to person, place, and  time.     Coordination: Coordination normal.  Psychiatric:        Mood and Affect: Mood normal.      ED Treatments / Results  Labs (all labs ordered are listed, but only abnormal results are displayed) Labs Reviewed - No data to display  EKG None  Radiology No results found.  Procedures Procedures (including critical care time)  Medications Ordered in ED Medications - No data to display   Initial Impression / Assessment and Plan / ED Course  I have reviewed the triage vital signs and the nursing notes.  Pertinent labs & imaging results that were available during my care of the patient were reviewed by me and considered in my medical decision making (see chart for details).        Findings are highly suggestive of venous stasis.  Patient does sit for prolonged periods of time.  On physical exam she has symmetric small amount of pitting just around the ankles.  Malleoli are still visible and there is no pitting of the feet.  No pitting up the calves no joint effusions.  Given how quickly this is resolving and that this is symmetric I have high suspicion for venous insufficiency.  Reports there was a similar episode a couple weeks ago where she suddenly had swelling of the legs and then it resolved.  No signs of CHF.  Her lungs are clear heart is regular without murmurs.  Patient is otherwise clinically well in appearance.  I reviewed labs from 2 weeks ago and 2 months ago.  There is been no renal insufficiency.  CBC has been normal without any anemia.  Patient is working with her family physician for referrals to rheumatology for multiple systemic related symptoms.  She is also concerned for being "prediabetic".  She is  getting her A1c drawn tomorrow.  Patient is stable to continue working with her PCP for further evaluation.  At this time, no immediate indication for additional diagnostic evaluation in the emergency department.  Final Clinical Impressions(s) / ED Diagnoses   Final diagnoses:  Peripheral edema    ED Discharge Orders    None       Arby BarrettePfeiffer, Betti Goodenow, MD 04/07/19 1750

## 2019-04-07 NOTE — Telephone Encounter (Signed)
Called and spoke with pt. She advised that she had been drinking iced coffee until lunch time when she switched to sparkling water. Pt states she went to restroom and looked at her ankles and noticed that he ankles were swollen so much they went over the tops of her shoes. She immediatley went into her office and elevated her legs. Pt states that this was about 40 minutes ago.   She advised that at the current moment the swelling has traveled up her legs and her calves are "hard as rocks, with tingling, and pain" pt denies any redness. Advised pt that since she is having such drastic changes she needed to start drinking regular water and she is going right now to the [facility] for evaluation.   I did not cancel the appt tomorrow just in case she needs a follow up from ER.

## 2019-04-07 NOTE — ED Triage Notes (Addendum)
Pt c/o onset of bilat ankle swelling and tingling/numbness to bilat LE x 45 min-pt reports recent elevated glucose with plans to have Hgb A1C drawn tomorrow-NAD-steady gait

## 2019-04-07 NOTE — Discharge Instructions (Signed)
1.  Follow instructions in the information on venous insufficiency.  Follow-up with Dr. Birdie Riddle as soon as possible to continue your evaluation.

## 2019-04-08 ENCOUNTER — Encounter: Payer: Self-pay | Admitting: Family Medicine

## 2019-04-08 ENCOUNTER — Ambulatory Visit: Payer: 59 | Admitting: Family Medicine

## 2019-04-08 ENCOUNTER — Other Ambulatory Visit: Payer: Self-pay | Admitting: Family Medicine

## 2019-04-08 VITALS — BP 122/82 | HR 99 | Temp 97.6°F | Resp 16 | Ht 66.0 in | Wt 190.4 lb

## 2019-04-08 DIAGNOSIS — M797 Fibromyalgia: Secondary | ICD-10-CM | POA: Diagnosis not present

## 2019-04-08 DIAGNOSIS — N951 Menopausal and female climacteric states: Secondary | ICD-10-CM | POA: Diagnosis not present

## 2019-04-08 DIAGNOSIS — M7989 Other specified soft tissue disorders: Secondary | ICD-10-CM

## 2019-04-08 DIAGNOSIS — F418 Other specified anxiety disorders: Secondary | ICD-10-CM

## 2019-04-08 NOTE — Patient Instructions (Addendum)
Schedule your complete physical for October CONTINUE the Lyrica as directed WEAR compression socks when sitting Try and get up throughout the day to improve circulation and swelling of the lower legs ADD [place] to see if that improves the perimenopausal symptoms Call with any questions or concerns Hang in there!!!

## 2019-04-08 NOTE — Progress Notes (Signed)
   Subjective:    Patient ID: [name], female    DOB: 1975/11/26, 43 y.o.   MRN: [id number]  HPI ER f/u- pt went to ER yesterday for LE edema.  EDP felt that she was suffering from venous insufficiency/stasis.  ER noted symmetric, small amount of pitting around the ankles.  No swelling of feet or calves.  Today pt has no swelling.  Walked a 1/2 mile this AM w/ dog and after walk, shower, and getting ready- 'I was just wiped out'.  Was recommended to get compression socks and increase activity.  Depression/anxiety- pt was started on Wellbutrin at last visit.  She stopped this after a few days due to dizziness, HAs.  Fibromyalgia- pt saw Rheum last week, 'all tests were negative'.    Perimenopausal- pt reports irritability, fatigue, weight gain, hot flashes.  Periods are becoming irregular but still present.   Review of Systems For ROS see HPI     Objective:   Physical Exam Constitutional:      General: She is not in acute distress.    Appearance: Normal appearance. She is well-developed. She is obese.  HENT:     Head: Normocephalic and atraumatic.  Eyes:     Conjunctiva/sclera: Conjunctivae normal.     Pupils: Pupils are equal, round, and reactive to light.  Neck:     Musculoskeletal: Normal range of motion and neck supple.     Thyroid: No thyromegaly.  Cardiovascular:     Rate and Rhythm: Normal rate and regular rhythm.     Heart sounds: Normal heart sounds. No murmur.  Pulmonary:     Effort: Pulmonary effort is normal. No respiratory distress.     Breath sounds: Normal breath sounds.  Abdominal:     General: There is no distension.     Palpations: Abdomen is soft.     Tenderness: There is no abdominal tenderness.  Musculoskeletal:     Right lower leg: No edema.     Left lower leg: No edema.  Lymphadenopathy:     Cervical: No cervical adenopathy.  Skin:    General: Skin is warm and dry.  Neurological:     Mental Status: She is alert and oriented to person, place,  and time.  Psychiatric:        Behavior: Behavior normal.           Assessment & Plan:  Leg swelling- not apparent today.  Agree w/ ER that this is likely venous insufficiency due to sedentary job and weight gain.  Again discussed compression socks, increased activity, and elevating legs when possible.  Will follow.  Fibro- given that her recent Rheum workup was negative, Fibro is again the most likely dx (given the exclusion of other conditions).  Continue Lyrica at current dose.  Will follow.  Depression/anxiety- unable to tolerate Wellbutrin due to side effects.  Rather than adding additional medication at this time will focus on perimenopausal sxs w/ hope of improving mood.  Pt expressed understanding and is in agreement w/ plan.   Perimenopausal sxs- new.  Pt's cluster of sxs are consistent w/ perimenopause.  Will start [place].  Encouraged low carb diet.  If no improvement, pt to discuss w/ GYN.  Pt expressed understanding and is in agreement w/ plan.

## 2019-04-18 ENCOUNTER — Other Ambulatory Visit: Payer: Self-pay | Admitting: Family Medicine

## 2019-04-25 ENCOUNTER — Other Ambulatory Visit: Payer: Self-pay

## 2019-04-25 ENCOUNTER — Ambulatory Visit (INDEPENDENT_AMBULATORY_CARE_PROVIDER_SITE_OTHER): Payer: 59

## 2019-04-25 DIAGNOSIS — E039 Hypothyroidism, unspecified: Secondary | ICD-10-CM | POA: Diagnosis not present

## 2019-04-25 LAB — TSH: TSH: 0.2 u[IU]/mL — ABNORMAL LOW (ref 0.35–4.50)

## 2019-04-26 ENCOUNTER — Other Ambulatory Visit: Payer: Self-pay | Admitting: General Practice

## 2019-04-26 ENCOUNTER — Encounter: Payer: Self-pay | Admitting: Family Medicine

## 2019-04-26 DIAGNOSIS — E039 Hypothyroidism, unspecified: Secondary | ICD-10-CM

## 2019-04-26 MED ORDER — LEVOTHYROXINE SODIUM 137 MCG PO TABS: 137.0000 ug | ORAL_TABLET | Freq: Every day | ORAL | 3 refills | Status: DC

## 2019-05-03 NOTE — Telephone Encounter (Signed)
Please advise on how you want to proceed with medication. Referral was just placed on 04/27/19. I advised pt that it may take a week for her to hear from endo.

## 2019-05-04 MED ORDER — IBUPROFEN 800 MG PO TABS: 800.0000 mg | ORAL_TABLET | Freq: Three times a day (TID) | ORAL | 0 refills | Status: DC | PRN

## 2019-05-04 NOTE — Addendum Note (Signed)
Addended by: Davis Gourd on: 05/04/2019 08:26 AM   Modules accepted: Orders

## 2019-05-25 ENCOUNTER — Other Ambulatory Visit: Payer: Self-pay

## 2019-05-25 ENCOUNTER — Ambulatory Visit (INDEPENDENT_AMBULATORY_CARE_PROVIDER_SITE_OTHER): Payer: 59

## 2019-05-25 DIAGNOSIS — E039 Hypothyroidism, unspecified: Secondary | ICD-10-CM

## 2019-05-25 DIAGNOSIS — Z23 Encounter for immunization: Secondary | ICD-10-CM | POA: Diagnosis not present

## 2019-05-25 LAB — TSH: TSH: 1.78 u[IU]/mL (ref 0.35–4.50)

## 2019-05-30 ENCOUNTER — Ambulatory Visit: Payer: 59 | Admitting: Internal Medicine

## 2019-05-30 ENCOUNTER — Other Ambulatory Visit: Payer: Self-pay

## 2019-05-30 ENCOUNTER — Encounter: Payer: Self-pay | Admitting: Internal Medicine

## 2019-05-30 ENCOUNTER — Telehealth: Payer: Self-pay | Admitting: Family Medicine

## 2019-05-30 VITALS — BP 120/72 | HR 108 | Temp 98.6°F | Ht 66.0 in | Wt 198.6 lb

## 2019-05-30 DIAGNOSIS — E039 Hypothyroidism, unspecified: Secondary | ICD-10-CM | POA: Diagnosis not present

## 2019-05-30 DIAGNOSIS — M797 Fibromyalgia: Secondary | ICD-10-CM

## 2019-05-30 NOTE — Telephone Encounter (Signed)
Called and spoke with pt and advised that we could do a referral to pain clinic for fibromyalgia. Referral placed.

## 2019-05-30 NOTE — Telephone Encounter (Signed)
Pt called in stating that she went to see Dr. Kelton Pillar today she was told that she doesn't treat Fibromyalgia and nor does Dr. Amil Amen. Dr. Kelton Pillar told her today that she may want to go to a pain management provider due to being in pain. Pt states that she is very frustrated and confused about what her next steps should be. Pt can be reached at the home # and would like a call back asap.

## 2019-05-30 NOTE — Telephone Encounter (Signed)
Please advise 

## 2019-05-30 NOTE — Telephone Encounter (Signed)
She was supposed to see Dr Kelton Pillar for her thyroid- not her fibro. If Dr Amil Amen doesn't treat fibro either and pain is still the biggest issue, we may need to consider pain management

## 2019-05-30 NOTE — Progress Notes (Signed)
Name: [name]  MRN/ DOB: [id number], 25-Feb-1976    Age/ Sex: 43 y.o., female    PCP: Midge Minium, MD   Reason for Endocrinology Evaluation: Hypothyroidism      Date of Initial Endocrinology Evaluation: 05/30/2019     HPI: [name] is a 43 y.o. female with a past medical history of hypothyroidism , PCOS and depression.  The patient presented for initial endocrinology clinic visit on 05/30/2019 for consultative assistance with her hypothyroidism.   Pt with hypothyroidism since her 20's. Has been on LT- replacement since her diagnosis .    She has been stable on 175 but over the past few months her dose had been gradually reduced due to low TSH.  She is currently on 137 mcg daily     Was recently started on PPI that she has been taking it with LT-4 replacement   She is on MVI that she takes in the afternoon   No Biotin  She works for an [employer]:  Past Medical History:  Past Medical History:  Diagnosis Date  . Anxiety   . Arthritis   . Depression   . Fibromyalgia   . Hypothyroidism 1999   post PTU Rx for hyperthyroidism  . IBS (irritable bowel syndrome)    Past Surgical History:  Past Surgical History:  Procedure Laterality Date  . CESAREAN SECTION     x 2      Social History:  reports that she has never smoked. She has never used smokeless tobacco. She reports current alcohol use. She reports that she does not use drugs.  Family History: family history includes ADD / ADHD in her son; Cancer in her paternal grandfather; Diabetes in her father and paternal grandmother; Hyperlipidemia in her mother.   HOME MEDICATIONS: Allergies as of 05/30/2019      Reactions   Methimazole Other (See Comments)   Body goes into arthritic shock      Medication List       Accurate as of May 30, 2019  2:22 PM. If you have any questions, ask your nurse or doctor.        albuterol 108 (90 Base) MCG/ACT inhaler Commonly known  as: ProAir HFA Inhale 2 puffs into the lungs every 4 (four) hours as needed for wheezing or shortness of breath.   ALPRAZolam 0.5 MG tablet Commonly known as: XANAX Take 1 tablet (0.5 mg total) by mouth 2 (two) times daily as needed for anxiety.   cholecalciferol 25 MCG (1000 UT) tablet Commonly known as: VITAMIN D3 Take 1,000 Units by mouth daily.   dicyclomine 20 MG tablet Commonly known as: BENTYL Take 20 mg by mouth every 6 (six) hours as needed.   DULoxetine 60 MG capsule Commonly known as: CYMBALTA TAKE 1 CAPSULE BY MOUTH EVERY DAY   fluticasone 50 MCG/ACT nasal spray Commonly known as: FLONASE SPRAY 2 SPRAYS INTO EACH NOSTRIL EVERY DAY   ibuprofen 800 MG tablet Commonly known as: ADVIL Take 1 tablet (800 mg total) by mouth every 8 (eight) hours as needed.   levothyroxine 137 MCG tablet Commonly known as: SYNTHROID Take 1 tablet (137 mcg total) by mouth daily before breakfast.   meloxicam 15 MG tablet Commonly known as: MOBIC TAKE 1 TABLET BY MOUTH EVERY DAY   NEXIUM PO Take by mouth.   ondansetron 4 MG tablet Commonly known as: Zofran Take 1 tablet (4 mg total) by mouth every 8 (eight) hours  as needed for nausea or vomiting.   pregabalin 150 MG capsule Commonly known as: Lyrica Take 1 capsule (150 mg total) by mouth 2 (two) times daily.   SUMAtriptan 50 MG tablet Commonly known as: Imitrex Take 1 tablet (50 mg total) by mouth every 2 (two) hours as needed for migraine. May repeat in 2 hours if headache persists or recurs.   Vitamin D (Ergocalciferol) 1.25 MG (50000 UT) Caps capsule Commonly known as: DRISDOL Take 1 capsule (50,000 Units total) by mouth every 7 (seven) days.         REVIEW OF SYSTEMS: A comprehensive ROS was conducted with the patient and is negative except as per HPI and below:  Review of Systems  Constitutional: Positive for malaise/fatigue. Negative for weight loss.  HENT: Negative for congestion and sore throat.   Respiratory:  Negative for cough and shortness of breath.   Cardiovascular: Negative for chest pain and palpitations.  Gastrointestinal: Positive for constipation, diarrhea and nausea.  Musculoskeletal: Positive for back pain and joint pain.  Neurological: Positive for tingling. Negative for tremors.       OBJECTIVE:  VS: BP 120/72 (BP Location: Left Arm, Patient Position: Sitting, Cuff Size: Large)   Pulse (!) 108   Temp 98.6 F (37 C)   Ht _0  (1.676 m)   Wt 198 lb 9.6 oz (90.1 kg)   SpO2 96%   BMI 32.05 kg/m    Wt Readings from Last 3 Encounters:  05/30/19 198 lb 9.6 oz (90.1 kg)  04/08/19 190 lb 6 oz (86.4 kg)  04/07/19 192 lb (87.1 kg)     EXAM: General: Pt appears well and is in NAD  Hydration: Well-hydrated with moist mucous membranes and good skin turgor  Eyes: External eye exam normal without stare, lid lag or exophthalmos.  EOM intact.    Ears, Nose, Throat: Hearing: Grossly intact bilaterally Dental: Good dentition  Throat: Clear without mass, erythema or exudate  Neck: General: Supple without adenopathy. Thyroid: Thyroid size normal.  No goiter or nodules appreciated. No thyroid bruit.  Lungs: Clear with good BS bilat with no rales, rhonchi, or wheezes  Heart: Auscultation: RRR.  Abdomen: Normoactive bowel sounds, soft, nontender, without masses or organomegaly palpable  Extremities:  BL LE: No pretibial edema normal ROM and strength.  Skin: Hair: Texture and amount normal with gender appropriate distribution Skin Inspection: No rashes Skin Palpation: Skin temperature, texture, and thickness normal to palpation  Neuro: Cranial nerves: II - XII grossly intact  Motor: Normal strength throughout DTRs: 2+ and symmetric in UE without delay in relaxation phase  Mental Status: Judgment, insight: Intact Orientation: Oriented to time, place, and person Mood and affect: No depression, anxiety, or agitation     DATA REVIEWED: Results for MCKYNZI, CAMMON (MRN [id number]) as of  05/31/2019 09:39  Ref. Range 05/25/2019 13:45  TSH Latest Ref Range: 0.35 - 4.50 uIU/mL 1.78      ASSESSMENT/PLAN/RECOMMENDATIONS:   1. Hypothyroidism:   - Clinically and biochemically euthyroid  - Pt educated extensively on the correct way to take levothyroxine (first thing in the morning with water, 30 minutes before eating or taking other medications). - Pt encouraged to double dose the following day if she were to miss a dose given long half-life of levothyroxine. - Will repeat TFT's in 6 weeks   Medications : Continue Levothyroxine 137 mcg daily    2. Fibromyalgia:  - She is under the impression that this would be discussed today, I explained to her  fibromyalgia is a rheumatology issue and not an endocrine issue. Pt tells me the rheumatologist that she was referred to does not manage fibromyalgia.  - She was on TCA per patient and now on Lyrica.  - I have advised her to try [place] or physical therapy , if not then she will have to discuss with her PCP a referral to pain management.   F/U in 6 weeks     Signed electronically by: Mack Guise, MD  [facility] Endocrinology  [address] [place], [place] [address] Phone: [phone] FAX: [phone]   CC: Midge Minium, MD [address] [place]  [address] Phone: [phone] Fax: [phone]   Return to Endocrinology clinic as below: Future Appointments  Date Time Provider [place]  07/07/2019  3:00 PM Midge Minium, MD [id number]

## 2019-05-30 NOTE — Patient Instructions (Signed)

## 2019-05-31 ENCOUNTER — Encounter: Payer: Self-pay | Admitting: Internal Medicine

## 2019-06-01 ENCOUNTER — Encounter: Payer: 59 | Admitting: Family Medicine

## 2019-06-08 LAB — HM MAMMOGRAPHY

## 2019-06-13 ENCOUNTER — Other Ambulatory Visit: Payer: Self-pay | Admitting: Family Medicine

## 2019-06-15 ENCOUNTER — Telehealth (HOSPITAL_COMMUNITY): Payer: Self-pay | Admitting: *Deleted

## 2019-06-15 ENCOUNTER — Ambulatory Visit (HOSPITAL_COMMUNITY): Payer: 59

## 2019-06-15 ENCOUNTER — Ambulatory Visit (HOSPITAL_COMMUNITY)
Admission: RE | Admit: 2019-06-15 | Discharge: 2019-06-15 | Disposition: A | Discharge status: Home / Self Care | Payer: 59 | Source: Ambulatory Visit | Attending: Internal Medicine | Admitting: Internal Medicine

## 2019-06-15 ENCOUNTER — Other Ambulatory Visit (HOSPITAL_COMMUNITY): Payer: Self-pay | Admitting: Internal Medicine

## 2019-06-15 DIAGNOSIS — R079 Chest pain, unspecified: Secondary | ICD-10-CM

## 2019-06-15 DIAGNOSIS — R0602 Shortness of breath: Secondary | ICD-10-CM

## 2019-06-15 MED ORDER — IOHEXOL 350 MG/ML SOLN
100.0000 mL | Freq: Once | INTRAVENOUS | Status: AC | PRN
  Administered 2019-06-15: 100 mL via INTRAVENOUS

## 2019-06-15 NOTE — Telephone Encounter (Signed)
The above patient or their representative was contacted and gave the following answers to these questions:         Do you have any of the following symptoms?    NO  Fever                    Cough                   Shortness of breath  Do  you have any of the following other symptoms?    muscle pain         vomiting,        diarrhea        rash         weakness        red eye        abdominal pain         bruising          bruising or bleeding              joint pain           severe headache    Have you been in contact with someone who was or has been sick in the past 2 weeks?  NO  Yes                 Unsure                         Unable to assess   Does the person that you were in contact with have any of the following symptoms?   Cough         shortness of breath           muscle pain         vomiting,            diarrhea            rash            weakness           fever            red eye           abdominal pain           bruising  or  bleeding                joint pain                severe headache                 COMMENTS OR ACTION PLAN FOR THIS PATIENT:    NEGATIVE FOR COVID PER JENNIFER

## 2019-06-16 ENCOUNTER — Encounter (HOSPITAL_COMMUNITY): Payer: 59

## 2019-06-21 ENCOUNTER — Encounter: Payer: Self-pay | Admitting: General Practice

## 2019-06-22 ENCOUNTER — Other Ambulatory Visit (HOSPITAL_COMMUNITY): Payer: Self-pay | Admitting: Internal Medicine

## 2019-06-22 DIAGNOSIS — J180 Bronchopneumonia, unspecified organism: Secondary | ICD-10-CM

## 2019-06-22 DIAGNOSIS — R9431 Abnormal electrocardiogram [ECG] [EKG]: Secondary | ICD-10-CM

## 2019-06-22 DIAGNOSIS — R0602 Shortness of breath: Secondary | ICD-10-CM

## 2019-06-26 DIAGNOSIS — J189 Pneumonia, unspecified organism: Secondary | ICD-10-CM

## 2019-06-26 HISTORY — DX: Pneumonia, unspecified organism: J18.9

## 2019-07-01 ENCOUNTER — Encounter: Payer: Self-pay | Admitting: Pulmonary Disease

## 2019-07-01 ENCOUNTER — Ambulatory Visit: Payer: 59 | Admitting: Pulmonary Disease

## 2019-07-01 ENCOUNTER — Ambulatory Visit (HOSPITAL_BASED_OUTPATIENT_CLINIC_OR_DEPARTMENT_OTHER)
Admission: RE | Admit: 2019-07-01 | Discharge: 2019-07-01 | Disposition: A | Discharge status: Home / Self Care | Payer: 59 | Source: Ambulatory Visit | Attending: Pulmonary Disease | Admitting: Pulmonary Disease

## 2019-07-01 ENCOUNTER — Other Ambulatory Visit: Payer: Self-pay

## 2019-07-01 DIAGNOSIS — J849 Interstitial pulmonary disease, unspecified: Secondary | ICD-10-CM

## 2019-07-01 LAB — CBC WITH DIFFERENTIAL/PLATELET
Basophils Absolute: 0.1 10*3/uL (ref 0.0–0.1)
Basophils Relative: 0.7 % (ref 0.0–3.0)
Eosinophils Absolute: 0.3 10*3/uL (ref 0.0–0.7)
Eosinophils Relative: 3.9 % (ref 0.0–5.0)
HCT: 40.3 % (ref 36.0–46.0)
Hemoglobin: 13.2 g/dL (ref 12.0–15.0)
Lymphocytes Relative: 16.3 % (ref 12.0–46.0)
Lymphs Abs: 1.2 10*3/uL (ref 0.7–4.0)
MCHC: 32.9 g/dL (ref 30.0–36.0)
MCV: 89.1 fl (ref 78.0–100.0)
Monocytes Absolute: 0.7 10*3/uL (ref 0.1–1.0)
Monocytes Relative: 9.3 % (ref 3.0–12.0)
Neutro Abs: 5.3 10*3/uL (ref 1.4–7.7)
Neutrophils Relative %: 69.8 % (ref 43.0–77.0)
Platelets: 287 10*3/uL (ref 150.0–400.0)
RBC: 4.52 Mil/uL (ref 3.87–5.11)
RDW: 14.6 % (ref 11.5–15.5)
WBC: 7.6 10*3/uL (ref 4.0–10.5)

## 2019-07-01 LAB — PROTIME-INR
INR: 1.1 ratio — ABNORMAL HIGH (ref 0.8–1.0)
Prothrombin Time: 12.9 s (ref 9.6–13.1)

## 2019-07-01 NOTE — Patient Instructions (Signed)
We will check some labs today including CBC differential, IgE, hypersensitivity panel, PT/INR  We will schedule you for high-resolution CT and a bronchoscope next week.  We will get in touch with you regarding the time and date Please get rid of the down pillows or comforter  Follow-up in 2 weeks

## 2019-07-01 NOTE — Progress Notes (Signed)
[name]    [id number]    1976-08-20  Primary Care Physician:Skakle, Liane Comber DO  Referring Physician: Sueanne Margarita, [address] [place],  [place] [address]  Chief complaint:  Consult for abnormal CT  HPI: 43 year old with history of fibromyalgia, depression, anxiety Referred for evaluation of abnormal CT scan  Complains of increasing dyspnea with chest pressure since mid August 2020.  She had a CTA done by her primary care recently which did not show any embolism but showed bilateral diffuse pulmonary infiltrates.  COVID-19 test was negative.  She has been treated with doxycycline and azithromycin with no improvement in symptoms Follow-up chest x-ray at her primary care yesterday apparently showed persistent interstitial abnormalities and she was given Symbicort, nebs and started on levofloxacin.  Referred to pulmonary for further evaluation.  She has an echo with shunt study and HIV test pending at primary care.  Per the patient she has unspecified autoimmune disease and has been referred to Dr. Trudie Reed, rheumatology.  She had a work-up done including labs which were reportedly negative.  Pets: Has dogs.  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  She has it down comforter for many years and got a down pillow 1 year ago Smoking history: No significant smoking history Travel history: Previously lived in [place], [place].  Has been living in [place] for the past 18 years.  No significant recent travel Relevant family history: Grandfather died of lung cancer.  No other significant family history of lung disease  Outpatient Encounter Medications as of 07/01/2019  Medication Sig  . albuterol (PROAIR HFA) 108 (90 Base) MCG/ACT inhaler Inhale 2 puffs into the lungs every 4 (four) hours as needed for wheezing or shortness of breath.  . cholecalciferol (VITAMIN D3) 25 MCG (1000  UT) tablet Take 1,000 Units by mouth daily.  . DULoxetine (CYMBALTA) 60 MG capsule TAKE 1 CAPSULE BY MOUTH EVERY DAY  . ibuprofen (ADVIL) 800 MG tablet Take 1 tablet (800 mg total) by mouth every 8 (eight) hours as needed.  Marland Kitchen ipratropium-albuterol (DUONEB) 0.5-2.5 (3) MG/3ML SOLN Take 3 mLs by nebulization every 4 (four) hours as needed.  Marland Kitchen levofloxacin (LEVAQUIN) 500 MG tablet Take 500 mg by mouth daily.  Marland Kitchen levothyroxine (SYNTHROID) 137 MCG tablet Take 1 tablet (137 mcg total) by mouth daily before breakfast.  . pregabalin (LYRICA) 150 MG capsule Take 1 capsule (150 mg total) by mouth 2 (two) times daily.  Marland Kitchen ALPRAZolam (XANAX) 0.5 MG tablet Take 1 tablet (0.5 mg total) by mouth 2 (two) times daily as needed for anxiety. (Patient not taking: Reported on 07/01/2019)  . dicyclomine (BENTYL) 20 MG tablet Take 20 mg by mouth every 6 (six) hours as needed.  . meloxicam (MOBIC) 15 MG tablet TAKE 1 TABLET BY MOUTH EVERY DAY (Patient not taking: Reported on 05/30/2019)  . SUMAtriptan (IMITREX) 50 MG tablet Take 1 tablet (50 mg total) by mouth every 2 (two) hours as needed for migraine. May repeat in 2 hours if headache persists or recurs. (Patient not taking: Reported on 07/01/2019)  . Vitamin D, Ergocalciferol, (DRISDOL) 1.25 MG (50000 UT) CAPS capsule TAKE 1 CAPSULE (50,000 UNITS TOTAL) BY MOUTH EVERY 7 (SEVEN) DAYS. (Patient not taking: Reported on 07/01/2019)  . [DISCONTINUED] Esomeprazole Magnesium (NEXIUM PO) Take by mouth.  . [DISCONTINUED] fluticasone (FLONASE) 50 MCG/ACT nasal spray SPRAY 2 SPRAYS INTO EACH NOSTRIL EVERY DAY  . [  DISCONTINUED] ondansetron (ZOFRAN) 4 MG tablet Take 1 tablet (4 mg total) by mouth every 8 (eight) hours as needed for nausea or vomiting. (Patient not taking: Reported on 05/30/2019)   No facility-administered encounter medications on file as of 07/01/2019.     Allergies as of 07/01/2019 - Review Complete 07/01/2019  Allergen Reaction Noted  . Methimazole Other (See  Comments) 07/26/2007    Past Medical History:  Diagnosis Date  . Anxiety   . Arthritis   . Depression   . Fibromyalgia   . Hypothyroidism 1999   post PTU Rx for hyperthyroidism  . IBS (irritable bowel syndrome)     Past Surgical History:  Procedure Laterality Date  . CESAREAN SECTION     x 2    Family History  Problem Relation Age of Onset  . Diabetes Paternal Grandmother   . Hyperlipidemia Mother   . Diabetes Father   . Cancer Paternal Grandfather        lung  . ADD / ADHD Son   . Other Neg Hx        PCO    Social History   Socioeconomic History  . Marital status: Married    Spouse name: Not on file  . Number of children: Not on file  . Years of education: Not on file  . Highest education level: Not on file  Occupational History  . Occupation: Animator: [employer]  Social Needs  . Financial resource strain: Not on file  . Food insecurity    Worry: Not on file    Inability: Not on file  . Transportation needs    Medical: Not on file    Non-medical: Not on file  Tobacco Use  . Smoking status: Never Smoker  . Smokeless tobacco: Never Used  Substance and Sexual Activity  . Alcohol use: Yes    Comment: daily  . Drug use: No  . Sexual activity: Not on file  Lifestyle  . Physical activity    Days per week: Not on file    Minutes per session: Not on file  . Stress: Not on file  Relationships  . Social Herbalist on phone: Not on file    Gets together: Not on file    Attends religious service: Not on file    Active member of club or organization: Not on file    Attends meetings of clubs or organizations: Not on file    Relationship status: Not on file  . Intimate partner violence    Fear of current or ex partner: Not on file    Emotionally abused: Not on file    Physically abused: Not on file    Forced sexual activity: Not on file  Other Topics Concern  . Not on file  Social History Narrative  . Not on file     Review of systems: Review of Systems  Constitutional: Negative for fever and chills.  HENT: Negative.   Eyes: Negative for blurred vision.  Respiratory: as per HPI  Cardiovascular: Negative for chest pain and palpitations.  Gastrointestinal: Negative for vomiting, diarrhea, blood per rectum. Genitourinary: Negative for dysuria, urgency, frequency and hematuria.  Musculoskeletal: Negative for myalgias, back pain and joint pain.  Skin: Negative for itching and rash.  Neurological: Negative for dizziness, tremors, focal weakness, seizures and loss of consciousness.  Endo/Heme/Allergies: Negative for environmental allergies.  Psychiatric/Behavioral: Negative for depression, suicidal ideas and hallucinations.  All other systems reviewed and are negative.  Physical Exam: Blood pressure 118/82, pulse (!) 105, height _0  (1.676 m), weight 192 lb 6.4 oz (87.3 kg), last menstrual period 06/09/2019, SpO2 99 %. Gen:      No acute distress HEENT:  EOMI, sclera anicteric Neck:     No masses; no thyromegaly Lungs:    Clear to auscultation bilaterally; normal respiratory effort CV:         Regular rate and rhythm; no murmurs Abd:      + bowel sounds; soft, non-tender; no palpable masses, no distension Ext:    No edema; adequate peripheral perfusion Skin:      Warm and dry; no rash Neuro: alert and oriented x 3 Psych: normal mood and affect  Data Reviewed: Imaging: CTA 06/15/2019-no pulmonary embolism, mild diffuse interstitial prominence with bilateral groundglass and nodular airspace opacities.  I have reviewed the images personally.  Labs: ANA 03/25/2019-negative,  Rheumatoid factor 03/25/2019- < 14  From primary care Covid 19 testing 06/18/2019-negative  CBC 06/15/2019-WBC 5.87, eos 6.4%, absolute eosinophil count 053 Metabolic panel [id number] normal limits D-dimer 06/30/2019-0.41 BNP 06/30/2019-10.1  Assessment:  Abnormal CT CT scan reviewed with bilateral interstitial  abnormalities, nodular airspace opacities.  She is already being treated adequately for pneumonia with persistent symptoms and abnormal x-ray We will need evaluation for hypersensitivity pneumonia or eosinophilic pneumonia given mildly elevated peripheral eosinophils and exposure to down pillows and comforter  Records from Dr. Trudie Reed, rheumatology to review any autoimmune work-up Get high-res CT.  Schedule bronchoscopy with BAL and lung biopsy Check hypersensitivity panel, repeat CBC, PT/INR and IgE Advised her to get rid of her down pillows and comforters  Risks of the procedure reviewed in detail with patient and her husband over telephone and they are have agreed to proceed  More then 1/2 the time of the 40 min visit was spent in counseling and/or coordination of care with the patient and family.  Plan/Recommendations: - Repeat labs including CBC, IgE, hypersensitivity panel - High-res CT - Schedule bronchoscope - Get rid of down pillows and comforters  Marshell Garfinkel MD [facility] 07/01/2019, 10:35 AM  CC: Sueanne Margarita, DO

## 2019-07-04 ENCOUNTER — Ambulatory Visit (HOSPITAL_COMMUNITY): Payer: 59 | Attending: Internal Medicine

## 2019-07-04 ENCOUNTER — Other Ambulatory Visit: Payer: Self-pay

## 2019-07-04 ENCOUNTER — Other Ambulatory Visit (HOSPITAL_BASED_OUTPATIENT_CLINIC_OR_DEPARTMENT_OTHER): Payer: 59

## 2019-07-04 DIAGNOSIS — R0602 Shortness of breath: Secondary | ICD-10-CM | POA: Insufficient documentation

## 2019-07-04 DIAGNOSIS — R9431 Abnormal electrocardiogram [ECG] [EKG]: Secondary | ICD-10-CM | POA: Diagnosis present

## 2019-07-04 DIAGNOSIS — J180 Bronchopneumonia, unspecified organism: Secondary | ICD-10-CM | POA: Insufficient documentation

## 2019-07-04 LAB — IGE: IgE (Immunoglobulin E), Serum: 162 kU/L — ABNORMAL HIGH (ref <=114)

## 2019-07-05 ENCOUNTER — Telehealth: Payer: Self-pay | Admitting: Pulmonary Disease

## 2019-07-05 ENCOUNTER — Encounter (HOSPITAL_COMMUNITY): Payer: 59

## 2019-07-05 ENCOUNTER — Institutional Professional Consult (permissible substitution): Payer: 59 | Admitting: Pulmonary Disease

## 2019-07-05 SURGERY — BRONCHOSCOPY, WITH FLUOROSCOPY
Anesthesia: Moderate Sedation | Laterality: Bilateral

## 2019-07-05 NOTE — Progress Notes (Signed)
Spoke with pt and notified of results per Dr. Janne Lab. Pt verbalized understanding and denied any questions.

## 2019-07-05 NOTE — Telephone Encounter (Signed)
Spoke with pt and advised her of her results. She would like Dr. Vaughan Browner to call her to explain more in detail. She read the report on mychart and she has questions before she has the bronchoscopy. She is worried about ILD, UIP, but I advised her that the radiologist recommended another Ct in 3-6 months because it could be caused by the inflammation. Dr. Vaughan Browner please call pt as soon as possible to discuss.   Result Notes for CT Chest High Resolution  Notes recorded by Marshell Garfinkel, MD on 07/05/2019 at 1:23 PM EST  Please let patient know CT shows mild inflammation in the lung. This is improved compared to her prior CT scan  Labs look okay  Will await bronchoscopy for further evaluation.

## 2019-07-06 LAB — HYPERSENSITIVITY PNEUMONITIS
A. Pullulans Abs: NEGATIVE
A.Fumigatus #1 Abs: NEGATIVE
Micropolyspora faeni, IgG: NEGATIVE
Pigeon Serum Abs: NEGATIVE
Thermoact. Saccharii: NEGATIVE
Thermoactinomyces vulgaris, IgG: NEGATIVE

## 2019-07-06 NOTE — Telephone Encounter (Signed)
I called and discussed the results in detail with patient. Nothing further needed.

## 2019-07-07 ENCOUNTER — Encounter: Payer: 59 | Admitting: Family Medicine

## 2019-07-09 ENCOUNTER — Other Ambulatory Visit (HOSPITAL_COMMUNITY)
Admission: RE | Admit: 2019-07-09 | Discharge: 2019-07-09 | Disposition: A | Discharge status: Home / Self Care | Payer: 59 | Source: Ambulatory Visit | Attending: Pulmonary Disease | Admitting: Pulmonary Disease

## 2019-07-09 DIAGNOSIS — Z20828 Contact with and (suspected) exposure to other viral communicable diseases: Secondary | ICD-10-CM | POA: Diagnosis not present

## 2019-07-09 DIAGNOSIS — Z01812 Encounter for preprocedural laboratory examination: Secondary | ICD-10-CM | POA: Diagnosis not present

## 2019-07-10 LAB — NOVEL CORONAVIRUS, NAA (HOSP ORDER, SEND-OUT TO REF LAB; TAT 18-24 HRS): SARS-CoV-2, NAA: NOT DETECTED

## 2019-07-11 ENCOUNTER — Telehealth: Payer: Self-pay | Admitting: Pulmonary Disease

## 2019-07-11 NOTE — Telephone Encounter (Signed)
Had to leave message [name] #[phone] [name]

## 2019-07-12 ENCOUNTER — Ambulatory Visit (HOSPITAL_COMMUNITY): Payer: 59

## 2019-07-12 ENCOUNTER — Ambulatory Visit (HOSPITAL_COMMUNITY)
Admission: RE | Admit: 2019-07-12 | Discharge: 2019-07-12 | Disposition: A | Discharge status: Home / Self Care | Payer: 59 | Attending: Pulmonary Disease | Admitting: Pulmonary Disease

## 2019-07-12 ENCOUNTER — Telehealth: Payer: Self-pay | Admitting: Pulmonary Disease

## 2019-07-12 ENCOUNTER — Ambulatory Visit (HOSPITAL_COMMUNITY)
Admission: RE | Admit: 2019-07-12 | Discharge: 2019-07-12 | Disposition: A | Discharge status: Home / Self Care | Payer: 59 | Source: Ambulatory Visit | Attending: Pulmonary Disease | Admitting: Pulmonary Disease

## 2019-07-12 ENCOUNTER — Encounter (HOSPITAL_COMMUNITY)
Admission: RE | Disposition: A | Discharge status: Home / Self Care | Payer: Self-pay | Source: Home / Self Care | Attending: Pulmonary Disease

## 2019-07-12 DIAGNOSIS — Z20828 Contact with and (suspected) exposure to other viral communicable diseases: Secondary | ICD-10-CM | POA: Diagnosis not present

## 2019-07-12 DIAGNOSIS — F419 Anxiety disorder, unspecified: Secondary | ICD-10-CM | POA: Diagnosis not present

## 2019-07-12 DIAGNOSIS — Z79890 Hormone replacement therapy (postmenopausal): Secondary | ICD-10-CM | POA: Insufficient documentation

## 2019-07-12 DIAGNOSIS — M797 Fibromyalgia: Secondary | ICD-10-CM | POA: Insufficient documentation

## 2019-07-12 DIAGNOSIS — J984 Other disorders of lung: Secondary | ICD-10-CM | POA: Insufficient documentation

## 2019-07-12 DIAGNOSIS — J849 Interstitial pulmonary disease, unspecified: Secondary | ICD-10-CM

## 2019-07-12 DIAGNOSIS — Z801 Family history of malignant neoplasm of trachea, bronchus and lung: Secondary | ICD-10-CM | POA: Insufficient documentation

## 2019-07-12 DIAGNOSIS — F329 Major depressive disorder, single episode, unspecified: Secondary | ICD-10-CM | POA: Diagnosis not present

## 2019-07-12 DIAGNOSIS — Z9889 Other specified postprocedural states: Secondary | ICD-10-CM

## 2019-07-12 DIAGNOSIS — Z79899 Other long term (current) drug therapy: Secondary | ICD-10-CM | POA: Diagnosis not present

## 2019-07-12 HISTORY — PX: VIDEO BRONCHOSCOPY: SHX5072

## 2019-07-12 LAB — BODY FLUID CELL COUNT WITH DIFFERENTIAL
Eos, Fluid: 12 %
Lymphs, Fluid: 38 %
Monocyte-Macrophage-Serous Fluid: 37 % — ABNORMAL LOW (ref 50–90)
Neutrophil Count, Fluid: 13 % (ref 0–25)
Total Nucleated Cell Count, Fluid: 510 cu mm (ref 0–1000)

## 2019-07-12 SURGERY — BRONCHOSCOPY, WITH FLUOROSCOPY
Anesthesia: Moderate Sedation | Laterality: Bilateral

## 2019-07-12 MED ORDER — MIDAZOLAM HCL (PF) 5 MG/ML IJ SOLN
INTRAMUSCULAR | Status: AC
  Filled 2019-07-12: qty 2

## 2019-07-12 MED ORDER — FENTANYL CITRATE (PF) 100 MCG/2ML IJ SOLN
INTRAMUSCULAR | Status: DC | PRN
  Administered 2019-07-12 (×5): 25 ug via INTRAVENOUS

## 2019-07-12 MED ORDER — LIDOCAINE HCL URETHRAL/MUCOSAL 2 % EX GEL: 1.0000 "application " | Freq: Once | CUTANEOUS | Status: DC

## 2019-07-12 MED ORDER — SODIUM CHLORIDE 0.9 % IV SOLN
INTRAVENOUS | Status: DC
  Administered 2019-07-12: 08:00:00 via INTRAVENOUS

## 2019-07-12 MED ORDER — BUTAMBEN-TETRACAINE-BENZOCAINE 2-2-14 % EX AERO: 1.0000 | INHALATION_SPRAY | Freq: Once | CUTANEOUS | Status: DC

## 2019-07-12 MED ORDER — LIDOCAINE HCL 1 % IJ SOLN
INTRAMUSCULAR | Status: DC | PRN
  Administered 2019-07-12: 6 mL via RESPIRATORY_TRACT

## 2019-07-12 MED ORDER — PHENYLEPHRINE HCL 0.25 % NA SOLN
NASAL | Status: DC | PRN
  Administered 2019-07-12: 2 via NASAL

## 2019-07-12 MED ORDER — FENTANYL CITRATE (PF) 100 MCG/2ML IJ SOLN
INTRAMUSCULAR | Status: AC
  Filled 2019-07-12: qty 4

## 2019-07-12 MED ORDER — LIDOCAINE HCL URETHRAL/MUCOSAL 2 % EX GEL
CUTANEOUS | Status: DC | PRN
  Administered 2019-07-12: 1

## 2019-07-12 MED ORDER — PHENYLEPHRINE HCL 0.25 % NA SOLN: 1.0000 | Freq: Four times a day (QID) | NASAL | Status: DC | PRN

## 2019-07-12 MED ORDER — MIDAZOLAM HCL (PF) 10 MG/2ML IJ SOLN
INTRAMUSCULAR | Status: DC | PRN
  Administered 2019-07-12: 1 mg via INTRAVENOUS
  Administered 2019-07-12: 2 mg via INTRAVENOUS
  Administered 2019-07-12 (×3): 1 mg via INTRAVENOUS

## 2019-07-12 NOTE — Discharge Instructions (Signed)
Flexible Bronchoscopy, Care After This sheet gives you information about how to care for yourself after your test. Your doctor may also give you more specific instructions. If you have problems or questions, contact your doctor. Follow these instructions at home: Eating and drinking  Do not eat or drink anything (not even water) for 2 hours after your test, or until your numbing medicine (local anesthetic) wears off.  When your numbness is gone and your cough and gag reflexes have come back, you may: ? Eat only soft foods. ? Slowly drink liquids.  The day after the test, go back to your normal diet. Driving  Do not drive for 24 hours if you were given a medicine to help you relax (sedative).  Do not drive or use heavy machinery while taking prescription pain medicine. General instructions   Take over-the-counter and prescription medicines only as told by your doctor.  Return to your normal activities as told. Ask what activities are safe for you.  Do not use any products that have nicotine or tobacco in them. This includes cigarettes and e-cigarettes. If you need help quitting, ask your doctor.  Keep all follow-up visits as told by your doctor. This is important. It is very important if you had a tissue sample (biopsy) taken. Get help right away if:  You have shortness of breath that gets worse.  You get light-headed.  You feel like you are going to pass out (faint).  You have chest pain.  You cough up: ? More than a little blood. ? More blood than before. Summary  Do not eat or drink anything (not even water) for 2 hours after your test, or until your numbing medicine wears off.  Do not use cigarettes. Do not use e-cigarettes.  Get help right away if you have chest pain. This information is not intended to replace advice given to you by your health care provider. Make sure you discuss any questions you have with your health care provider. Document Released: 06/08/2009  Document Revised: 07/24/2017 Document Reviewed: 08/29/2016 Elsevier Patient Education  2020 [employer].  Nothing to eat or drink until 10:30 am today 07/12/2019 Any questions or conderns please call the office at [phone]

## 2019-07-12 NOTE — Progress Notes (Signed)
Video Bronchoscopy Performed  Intervention bronchial biopsy  Intervention bronchial washing

## 2019-07-12 NOTE — Op Note (Signed)
[facility] Cardiopulmonary Patient Name: [name] Date: 07/12/2019 MRN: [id number] Attending MD: Marshell Garfinkel , MD Date of Birth: 1975/12/27 CSN: Finalized Age: 43 Admit Type: Ambulatory Gender: Female Procedure:             Bronchoscopy Indications:           Diffuse infiltrate, Diffuse lung disease, Interstitial                         lung disease Providers:             Marshell Garfinkel, MD, Andre Lefort RRT,RCP, Ciro Backer RRT, RCP Referring MD:           Medicines:             Midazolam 6 mg IV, Fentanyl 035 mcg IV Complications:         No immediate complications Estimated Blood Loss:  Estimated blood loss: none. Procedure:             Pre-Anesthesia Assessment:                        - A History and Physical has been performed. Patient                         meds and allergies have been reviewed. The risks and                         benefits of the procedure and the sedation options and                         risks were discussed with the patient. All questions                         were answered and informed consent was obtained.                         Patient identification and proposed procedure were                         verified prior to the procedure by the physician in                         the procedure room. Mental Status Examination: alert                         and oriented. Airway Examination: normal oropharyngeal                         airway. Respiratory Examination: clear to                         auscultation. CV Examination: RRR, no murmurs, no S3                         or S4. ASA Grade Assessment: II - A patient with mild  systemic disease. After reviewing the risks and                         benefits, the patient was deemed in satisfactory                         condition to undergo the procedure. The anesthesia                         plan was to use  moderate sedation / analgesia                         (conscious sedation). Immediately prior to                         administration of medications, the patient was                         re-assessed for adequacy to receive sedatives. The                         heart rate, respiratory rate, oxygen saturations,                         blood pressure, adequacy of pulmonary ventilation, and                         response to care were monitored throughout the                         procedure. The physical status of the patient was                         re-assessed after the procedure.                        After obtaining informed consent, the bronchoscope was                         passed under direct vision. Throughout the procedure,                         the patient's blood pressure, pulse, and oxygen                         saturations were monitored continuously. the BF-H190                         ([id number]) Olympus Diagnostic Bronchoscope was                         introduced through the right nostril and advanced to                         the tracheobronchial tree. Scope In: 8:15:23 AM Scope Out: 8:32:18 AM Findings:      The nasopharynx/oropharynx appears normal. The larynx appears normal.       The vocal cords appear normal. The subglottic space is normal. The       trachea  is of normal caliber. The carina is sharp. The tracheobronchial       tree was examined to at least the first subsegmental level. Bronchial       mucosa and anatomy are normal; there are no endobronchial lesions. White       seceretions noted in the bronchial tree.      Bronchoalveolar lavage was performed in the RML lateral segment (B4) of       the lung and sent for cell count, bacterial culture, viral smears &       culture, and fungal & AFB analysis and cytology. 180 mL of fluid were       instilled. 120 mL were returned. The return was cloudy. There were no       mucoid plugs in the return  fluid. Multiple specimens were obtained and       pooled into one specimen, which was sent for analysis.      Transbronchial biopsies of an area of infiltration were performed in the       posterior segment of the right upper lobe, in the anterior segment of       the right upper lobe, in the lateral segment of the right middle lobe,       in the medial segment of the right middle lobe, in the medial basal       segment of the right lower lobe and in the lateral basal segment of the       right lower lobe using alligator forceps and sent for histopathology       examination. The procedure was guided by fluoroscopy. Transbronchial       biopsy technique was selected because the sampling site was not visible       endoscopically. Eight biopsy passes were performed. Eight biopsy samples       were obtained. Impression:            - Interstitial lung disease                        - The airway examination was normal except for white                         secretions                        - Bronchoalveolar lavage was performed.                        - Transbronchial lung biopsies were performed. Moderate Sedation:      Moderate (conscious) sedation was administered by the endoscopy nurse       and supervised by the endoscopist. The following parameters were       monitored: oxygen saturation, heart rate, blood pressure, respiratory       rate, EKG, adequacy of pulmonary ventilation, and response to care.       Total physician intraservice time was 30 minutes. Recommendation:        - Await BAL, biopsy and culture results. Procedure Code(s):     --- Professional ---                        [phone], Bronchoscopy, rigid or flexible, including  fluoroscopic guidance, when performed; with                         transbronchial lung biopsy(s), single lobe                        [id number], Bronchoscopy, rigid or flexible, including                         fluoroscopic guidance,  when performed; with bronchial                         alveolar lavage                        [phone], Bronchoscopy, rigid or flexible, including                         fluoroscopic guidance, when performed; with                         transbronchial lung biopsy(s), each additional lobe                         (List separately in addition to code for primary                         procedure)                        [phone], Bronchoscopy, rigid or flexible, including                         fluoroscopic guidance, when performed; with                         transbronchial lung biopsy(s), each additional lobe                         (List separately in addition to code for primary                         procedure)                        99152, Moderate sedation services provided by the same                         physician or other qualified health care professional                         performing the diagnostic or therapeutic service that                         the sedation supports, requiring the presence of an                         independent trained observer to assist in the                         monitoring of the patient's level of consciousness and  physiological status; initial 15 minutes of                         intraservice time, patient age 62 years or older                        [phone], Moderate sedation; each additional 15 minutes                         intraservice time Diagnosis Code(s):     --- Professional ---                        R91.8, Other nonspecific abnormal finding of lung field                        J84.9, Interstitial pulmonary disease, unspecified CPT copyright 2019 [employer]. All rights reserved. The codes documented in this report are preliminary and upon coder review may  be revised to meet current compliance requirements. Marshell Garfinkel, MD 07/12/2019 8:53:11 AM Number of Addenda: 0

## 2019-07-12 NOTE — H&P (Addendum)
[name]    [id number]    01-03-1976  Primary Care Physician:Skakle, Liane Comber, DO  Referring Physician: No referring provider defined for this encounter.  Chief complaint:  Follow up for abnormal CT  HPI: 43 year old with history of fibromyalgia, depression, anxiety Referred for evaluation of abnormal CT scan  Complains of increasing dyspnea with chest pressure since mid August 2020.  She had a CTA done by her primary care recently which did not show any embolism but showed bilateral diffuse pulmonary infiltrates.  COVID-19 test was negative.  She has been treated with doxycycline and azithromycin with no improvement in symptoms Follow-up chest x-ray at her primary care yesterday apparently showed persistent interstitial abnormalities and she was given Symbicort, nebs and started on levofloxacin.  Referred to pulmonary for further evaluation.  She has an echo with shunt study and HIV test pending at primary care.  Per the patient she has unspecified autoimmune disease and has been referred to Dr. Trudie Reed, rheumatology.  She had a work-up done including labs which were reportedly negative.  Pets: Has dogs.  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  She has it down comforter for many years and got a down pillow 1 year ago Smoking history: No significant smoking history Travel history: Previously lived in [place], [place].  Has been living in [place] for the past 18 years.  No significant recent travel Relevant family history: Grandfather died of lung cancer.  No other significant family history of lung disease  Interim history: Patient is here for bronchoscopy CT scan reviewed showing diffuse pulmonary infiltrates in alternative UIP pattern Patient has gotten great of her down pillows and comforters Reports that her breathing is slightly better but still has problems with  cough and ongoing dyspnea on exertion.  Outpatient Encounter Medications as of 07/01/2019  Medication Sig  . albuterol (PROAIR HFA) 108 (90 Base) MCG/ACT inhaler Inhale 2 puffs into the lungs every 4 (four) hours as needed for wheezing or shortness of breath.  . cholecalciferol (VITAMIN D3) 25 MCG (1000 UT) tablet Take 1,000 Units by mouth daily.  . DULoxetine (CYMBALTA) 60 MG capsule TAKE 1 CAPSULE BY MOUTH EVERY DAY  . ibuprofen (ADVIL) 800 MG tablet Take 1 tablet (800 mg total) by mouth every 8 (eight) hours as needed.  Marland Kitchen ipratropium-albuterol (DUONEB) 0.5-2.5 (3) MG/3ML SOLN Take 3 mLs by nebulization every 4 (four) hours as needed.  Marland Kitchen levofloxacin (LEVAQUIN) 500 MG tablet Take 500 mg by mouth daily.  Marland Kitchen levothyroxine (SYNTHROID) 137 MCG tablet Take 1 tablet (137 mcg total) by mouth daily before breakfast.  . pregabalin (LYRICA) 150 MG capsule Take 1 capsule (150 mg total) by mouth 2 (two) times daily.  Marland Kitchen ALPRAZolam (XANAX) 0.5 MG tablet Take 1 tablet (0.5 mg total) by mouth 2 (two) times daily as needed for anxiety. (Patient not taking: Reported on 07/01/2019)  . dicyclomine (BENTYL) 20 MG tablet Take 20 mg by mouth every 6 (six) hours as needed.  . meloxicam (MOBIC) 15 MG tablet TAKE 1 TABLET BY MOUTH EVERY DAY (Patient not taking: Reported on 05/30/2019)  . SUMAtriptan (IMITREX) 50 MG tablet Take 1 tablet (50 mg total) by mouth every 2 (two) hours as needed for migraine. May repeat in 2 hours if headache persists or recurs. (Patient not taking: Reported on 07/01/2019)  . Vitamin D, Ergocalciferol, (DRISDOL) 1.25 MG (50000 UT) CAPS capsule  TAKE 1 CAPSULE (50,000 UNITS TOTAL) BY MOUTH EVERY 7 (SEVEN) DAYS. (Patient not taking: Reported on 07/01/2019)  . [DISCONTINUED] Esomeprazole Magnesium (NEXIUM PO) Take by mouth.  . [DISCONTINUED] fluticasone (FLONASE) 50 MCG/ACT nasal spray SPRAY 2 SPRAYS INTO EACH NOSTRIL EVERY DAY  . [DISCONTINUED] ondansetron (ZOFRAN) 4 MG tablet Take 1 tablet (4 mg total)  by mouth every 8 (eight) hours as needed for nausea or vomiting. (Patient not taking: Reported on 05/30/2019)   No facility-administered encounter medications on file as of 07/01/2019.     Physical Exam: Blood pressure (!) 147/96, pulse (!) 101, temperature 98 F (36.7 C), temperature source Oral, resp. rate (!) 28, SpO2 100 %. Gen:      No acute distress HEENT:  EOMI, sclera anicteric Neck:     No masses; no thyromegaly Lungs:    Clear to auscultation bilaterally; normal respiratory effort CV:         Regular rate and rhythm; no murmurs Abd:      + bowel sounds; soft, non-tender; no palpable masses, no distension Ext:    No edema; adequate peripheral perfusion Skin:      Warm and dry; no rash Neuro: alert and oriented x 3 Psych: normal mood and affect  Data Reviewed: Imaging: CTA 06/15/2019-no pulmonary embolism, mild diffuse interstitial prominence with bilateral groundglass and nodular airspace opacities.    High-resolution CT 07/01/19-irregular reticulation with nodularity, groundglass opacity in the peripheries with no basal gradient.  No significant air trapping.  Alternate pattern. I have reviewed the images personally.  Labs: ANA 03/25/2019-negative,  Rheumatoid factor 03/25/2019- < 14 Hypersensitivity panel 07/01/2019-negative  From primary care Covid 19 testing 06/18/2019-negative  CBC 06/15/2019-WBC 5.87, eos 6.4%, absolute eosinophil count 263 Metabolic panel [id number] normal limits D-dimer 06/30/2019-0.41 BNP 06/30/2019-10.1  Assessment:  Abnormal CT CT scan reviewed with bilateral interstitial abnormalities, nodular airspace opacities.  She is already being treated adequately for pneumonia with persistent symptoms and abnormal x-ray  Differential diagnosis includes eosinophilic pneumonia, hypersensitivity pneumonitis, ongoing infection Risk benefits reviewed in detail with patient.  She is agreeable to the procedure Proceed with bronchoscopy with BAL with  BAL and biopsy.  Plan/Recommendations: - Bronchoscopy with BAL, biopsy  Marshell Garfinkel MD [facility] 07/12/2019, 8:56 AM  CC: No ref. provider found

## 2019-07-13 ENCOUNTER — Ambulatory Visit: Payer: 59 | Admitting: Pulmonary Disease

## 2019-07-13 ENCOUNTER — Encounter (HOSPITAL_COMMUNITY): Payer: Self-pay | Admitting: Pulmonary Disease

## 2019-07-13 LAB — MISC LABCORP TEST (SEND OUT)
LabCorp test name: 2019
Labcorp test code: 139900

## 2019-07-13 LAB — ACID FAST SMEAR (AFB, MYCOBACTERIA): Acid Fast Smear: NEGATIVE

## 2019-07-13 LAB — CYTOLOGY - NON PAP

## 2019-07-13 NOTE — Telephone Encounter (Signed)
[name] has been straightened out [name]

## 2019-07-14 LAB — CULTURE, BAL-QUANTITATIVE W GRAM STAIN
Culture: NO GROWTH
Special Requests: NORMAL

## 2019-07-14 LAB — PNEUMOCYSTIS JIROVECI SMEAR BY DFA

## 2019-07-14 MED ORDER — PREDNISONE 10 MG PO TABS: 40.0000 mg | ORAL_TABLET | Freq: Every day | ORAL | 0 refills | Status: DC

## 2019-07-14 NOTE — Telephone Encounter (Addendum)
I called and spoke with the patient. This looks like hypersensitivity pneumonitis based on down exposure, lymphocytes in BAL and granulomas on path I have called in prednisone 40 mg/day to be continued till she sees me back in clinic in early jan.  We will also check ACE levels and ILD panel (no need to do hypersensitivity panel). I have asked her to come on Monday to get the labs drawn  Marshell Garfinkel MD [facility] 07/14/2019, 6:16 PM

## 2019-07-14 NOTE — Addendum Note (Signed)
Addended byMarshell Garfinkel on: 07/14/2019 06:17 PM   Modules accepted: Orders

## 2019-07-14 NOTE — Telephone Encounter (Signed)
Pt sent the following email:  I know we're waiting on test results at this time. But treatments by the nebulizer and symbicort don't seem to be helping. I still feel shortness of breath. Anytime I try to do anything, I end up with chest pains. Even taking a shower is difficult and I'm completely winded after. Is there anything else I can do? Anything I am doing wrong?? I'd feel after 3 rounds of antibiotics and these treatments, I should be better and be able to do more. I literally have slept since the broncoscopy.  Please advise. Or call me direct. [phone] Thank you [name]  I called and spoke with her  She states these symptoms are not new for her and are unchanged since the last visit here with Dr Vaughan Browner  She describes the discomfort in her chest as more of a tightness rather than a pain She states no prod cough, chills, fever   She is asking what to do about her symptoms and Dr. Vaughan Browner is out of the office for the remainder of the wk. Please advise, thanks!

## 2019-07-14 NOTE — Telephone Encounter (Signed)
07/14/2019 1712  Sorry to hear the patient is not feeling well.  Recent results from bronchoscopy are listed below.  There is still multiple results that are pending are still growing out.  Sputum culture, AFB as well as fungal will grow out for a total of 6 weeks.  07/12/2019-bronchoscopy Cytology-no malignant cells  FINAL MICROSCOPIC DIAGNOSIS:   A. LUNG, RIGHT UPPER LOBE, BIOPSY:  - Benign bronchial mucosa with no specific histopathologic changes   B. LUNG, RIGHT MIDDLE LOBE, BIOPSY:  - Scant bronchial wall and mucosa with no specific histopathologic  changes   C. LUNG, RIGHT LOWER LOBE, BIOPSY:  - Bronchial mucosa and adjacent lung parenchyma with reactive changes  and a couple of nonnecrotizing epithelioid cell granulomas. See comment    COMMENT:   Though not diagnostic, the findings can be compatible with the clinical  suspicion of subacute hypersensitivity pneumonitis. Differential  diagnosis includes sarcoidosis, aspiration and infection. AFB and GMS stains to rule out acid-fast bacilli and fungal organisms are pending and will be reported in an addendum.  07/01/2019-hypersensitivity pneumonitis-negative  We will follow-up with Dr. Vaughan Browner regarding your concerns as well as acute symptoms.  We can also get the patient scheduled for a virtual visit to review once we hear back from Dr. Vaughan Browner.  Wyn Quaker, FNP

## 2019-07-15 ENCOUNTER — Other Ambulatory Visit: Payer: Self-pay

## 2019-07-15 ENCOUNTER — Telehealth: Payer: Self-pay

## 2019-07-15 DIAGNOSIS — J849 Interstitial pulmonary disease, unspecified: Secondary | ICD-10-CM

## 2019-07-15 LAB — SURGICAL PATHOLOGY

## 2019-07-15 NOTE — Telephone Encounter (Signed)
Labs for ILD Panel(minus HP) and ACE level have been placed so that the patient can come have them drawn next week.

## 2019-07-17 LAB — MISC LABCORP TEST (SEND OUT): Labcorp test code: 186296

## 2019-07-18 ENCOUNTER — Other Ambulatory Visit: Payer: 59

## 2019-07-18 DIAGNOSIS — J849 Interstitial pulmonary disease, unspecified: Secondary | ICD-10-CM

## 2019-07-20 LAB — ANA,IFA RA DIAG PNL W/RFLX TIT/PATN
Anti Nuclear Antibody (ANA): NEGATIVE
Cyclic Citrullin Peptide Ab: <16 UNITS
Rheumatoid fact SerPl-aCnc: <14 IU/mL (ref <14)

## 2019-07-20 LAB — SJOGREN'S SYNDROME ANTIBODS(SSA + SSB)
SSA (Ro) (ENA) Antibody, IgG: <1 AI
SSB (La) (ENA) Antibody, IgG: <1 AI

## 2019-07-20 LAB — ANCA SCREEN W REFLEX TITER: ANCA Screen: NEGATIVE

## 2019-07-20 LAB — ANGIOTENSIN CONVERTING ENZYME: Angiotensin-Converting Enzyme: 32 U/L (ref 9–67)

## 2019-07-20 LAB — ANTI-SCLERODERMA ANTIBODY: Scleroderma (Scl-70) (ENA) Antibody, IgG: <1 AI

## 2019-07-27 ENCOUNTER — Encounter: Payer: Self-pay | Admitting: Pulmonary Disease

## 2019-07-27 ENCOUNTER — Other Ambulatory Visit: Payer: Self-pay

## 2019-07-27 ENCOUNTER — Ambulatory Visit: Payer: 59 | Admitting: Pulmonary Disease

## 2019-07-27 VITALS — BP 120/82 | HR 100 | Temp 97.7°F | Ht 66.0 in | Wt 191.6 lb

## 2019-07-27 DIAGNOSIS — J849 Interstitial pulmonary disease, unspecified: Secondary | ICD-10-CM | POA: Diagnosis not present

## 2019-07-27 MED ORDER — BUDESONIDE-FORMOTEROL FUMARATE 160-4.5 MCG/ACT IN AERO: 1.0000 | INHALATION_SPRAY | Freq: Three times a day (TID) | RESPIRATORY_TRACT | 3 refills | Status: DC | PRN

## 2019-07-27 NOTE — Progress Notes (Signed)
[name]    [id number]    1976/02/23  Primary Care Physician:Skakle, Liane Comber, DO  Referring Physician: Sueanne Margarita, DO [address],  [place] [address]  Chief complaint: Follow-up for interstitial lung disease, hypersensitivity pneumonitis.  HPI: 43 year old with history of fibromyalgia, depression, anxiety Referred for evaluation of abnormal CT scan  Complains of increasing dyspnea with chest pressure since mid August 2020.  She had a CTA done by her primary care recently which did not show any embolism but showed bilateral diffuse pulmonary infiltrates.  COVID-19 test was negative.  She has been treated with doxycycline and azithromycin with no improvement in symptoms Follow-up chest x-ray at her primary care yesterday apparently showed persistent interstitial abnormalities and she was given Symbicort, nebs and started on levofloxacin.  Referred to pulmonary for further evaluation.  She has an echo with shunt study and HIV test pending at primary care.  Per the patient she has unspecified autoimmune disease and has been referred to Dr. Trudie Reed, rheumatology.  She had a work-up done including labs which were reportedly negative.  Pets: Has dogs.  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  She has a down comforter for many years and got a down pillow in 2019.   Smoking history: No significant smoking history Travel history: Previously lived in [place], [place].  Has been living in [place] for the past 18 years.  No significant recent travel Relevant family history: Grandfather died of lung cancer.  No other significant family history of lung disease.  Interim history:  Here for follow-up of interstitial lung disease She underwent bronchoscopy on 07/12/2019.  Started on 40 mg of prednisone on 11/19 for findings suggestive of hypersensitivity pneumonitis  She  is tolerating this dose well.  States that some of the chest tightness is improved but not completely gone away. She is overall anxious about her diagnosis of interstitial lung disease and possibility of pulmonary fibrosis.  Outpatient Encounter Medications as of 07/27/2019  Medication Sig  . acetaminophen (TYLENOL) 650 MG CR tablet Take 650 mg by mouth every 8 (eight) hours as needed for pain.  . budesonide-formoterol (SYMBICORT) 160-4.5 MCG/ACT inhaler Inhale 1 puff into the lungs 3 (three) times daily as needed (shortness of breath).  . Cholecalciferol (VITAMIN D) 50 MCG (2000 UT) tablet Take 2,000 Units by mouth daily in the afternoon.  . DULoxetine (CYMBALTA) 60 MG capsule TAKE 1 CAPSULE BY MOUTH EVERY DAY (Patient taking differently: Take 60 mg by mouth daily. )  . ibuprofen (ADVIL) 800 MG tablet Take 1 tablet (800 mg total) by mouth every 8 (eight) hours as needed. (Patient not taking: Reported on 07/05/2019)  . ipratropium-albuterol (DUONEB) 0.5-2.5 (3) MG/3ML SOLN Take 3 mLs by nebulization every 4 (four) hours as needed (shortness of breath).   Marland Kitchen levofloxacin (LEVAQUIN) 500 MG tablet Take 500 mg by mouth daily.  Marland Kitchen levothyroxine (SYNTHROID) 137 MCG tablet Take 1 tablet (137 mcg total) by mouth daily before breakfast.  . meloxicam (MOBIC) 15 MG tablet TAKE 1 TABLET BY MOUTH EVERY DAY (Patient not taking: Reported on 05/30/2019)  . Polyethyl Glycol-Propyl Glycol (SYSTANE OP) Place 1 drop into both eyes daily as needed (ocular rosacea).  . predniSONE (DELTASONE) 10 MG tablet Take 4 tablets (40 mg total) by mouth daily with breakfast.  . pregabalin (LYRICA) 150 MG capsule Take 1 capsule (150 mg total)  by mouth 2 (two) times daily.  . Vitamin D, Ergocalciferol, (DRISDOL) 1.25 MG (50000 UT) CAPS capsule TAKE 1 CAPSULE (50,000 UNITS TOTAL) BY MOUTH EVERY 7 (SEVEN) DAYS. (Patient not taking: Reported on 07/01/2019)   No facility-administered encounter medications on file as of 07/27/2019.     Physical Exam: Blood pressure 120/82, pulse 100, temperature 97.7 F (36.5 C), temperature source Oral, height _0  (1.676 m), weight 191 lb 9.6 oz (86.9 kg), SpO2 97 %. Gen:      No acute distress HEENT:  EOMI, sclera anicteric Neck:     No masses; no thyromegaly Lungs:    Clear to auscultation bilaterally; normal respiratory effort CV:         Regular rate and rhythm; no murmurs Abd:      + bowel sounds; soft, non-tender; no palpable masses, no distension Ext:    No edema; adequate peripheral perfusion Skin:      Warm and dry; no rash Neuro: alert and oriented x 3 Psych: normal mood and affect  Data Reviewed: Imaging: CTA 06/15/2019-no pulmonary embolism, mild diffuse interstitial prominence with bilateral groundglass and nodular airspace opacities.  I have reviewed the images personally.  Labs: ANA 03/25/2019-negative,  Rheumatoid factor 03/25/2019- < 14 Repeat CTD serologies 07/18/2019-negative, hypersensitivity panel-negative  CBC 06/15/2019-WBC 5.87, eos 6.4%, absolute eosinophil count 458 Metabolic panel [id number] normal limits D-dimer 06/30/2019-0.41 BNP 06/30/2019-10.   Bronchoscopy 07/12/2019 WBC 510, lymphocytes 38%, eos 12%, Microbiology-negative Cytology -no malignancy Transbronchial biopsy-nonnecrotizing epithelioid granulomas SARS-CoV-2, viral PCR-negative  Assessment:  Interstitial lung disease, hypersensitivity pneumonitis CT scan reviewed with bilateral interstitial abnormalities, nodular airspace opacities which have persisted after multiple rounds of antibiotics. Reviewed bronchoscopy results.  This looks like hypersensitivity pneumonitis based on down exposure, lymphocytes in BAL and granulomas on path  She has been on prednisone at 40 mg a day for the past 2 weeks and disposed of her down pillows and comforters. We will reduce steroids to 20 mg a day and keep at this dose for the next month Schedule pulmonary function Reassured the patient that  there is no evidence of pulmonary fibrosis or IPF which is her main fear.  Records from Dr. Trudie Reed, rheumatology to review any autoimmune work-up.  So far her work-up with CTD serologies has been negative.  More then 1/2 the time of the 40 min visit was spent in counseling and/or coordination of care with the patient and family.  Plan/Recommendations: - Reduce prednisone to 20 mg a day - Continue Symbicort inhaler, nebulizer - Pulmonary function test  Marshell Garfinkel MD [facility] 07/27/2019, 9:07 AM  CC: Sueanne Margarita, DO

## 2019-07-27 NOTE — Patient Instructions (Addendum)
Continue Symbicort inhaler.  We will call in a refill for this Give you a spacer to use with the use with the inhaler Use nebulizers as needed  Reduce prednisone to 20 mg a day We will schedule you for pulmonary function test Return to clinic in 1 month

## 2019-07-28 NOTE — Telephone Encounter (Signed)
Received the following message from patient:  "This morning while at work I started to feel very tired, my body was extremely exhausted and my head was foggy. Very dizzy and got disoriented so that I had to go home. A co-worker had to drive me home. Almost fell sideways when bending down for example. Last night I coughed a lot and this morning some. I think I pulled a muscle in chest- above right breast area. Tried to sleep / rest but just feel numb like. Should I be concerned or do something? I know we reduced the prednisone to 20mg  yesterday. Side effects?  Please advise. Thank you. [phone]"  I sent a message back to patient to see if she had taken any vital signs during this episode. Also asked if she had started any new medications. Advised her that if she does not feel better by tonight, to report to the nearest urgent care or ED.   Dr. Vaughan Browner, please advise if you have any recommendations.

## 2019-07-28 NOTE — Telephone Encounter (Signed)
Increase prednisone to 30 mg/day. Perhaps you are not tolerating the reduction from 40mg  to 20mg  and we will need to do a slower taper.  If symptoms persist then you may need to go to the emergency room.   Marshell Garfinkel MD [facility] 07/28/2019, 4:43 PM

## 2019-07-28 NOTE — Telephone Encounter (Signed)
Update from patient:   "No vitals were taken unfortunately. Checking blood pressure now on my iWatch. Spouse home with me so yes I have someone here to help.  Also to note are several spots on face, near hairlines that are sore and raided. Now scanning and peeling. Have had for 2 weeks. Side effect of prednisone? I have aprox #7-10 areas.  Thank you. Lollie Marrow"

## 2019-07-31 LAB — MYOMARKER 3 PLUS PROFILE (RDL)

## 2019-08-02 ENCOUNTER — Ambulatory Visit: Payer: Self-pay | Admitting: Pulmonary Disease

## 2019-08-11 LAB — FUNGUS CULTURE WITH STAIN

## 2019-08-11 LAB — FUNGUS CULTURE RESULT

## 2019-08-11 LAB — FUNGAL ORGANISM REFLEX

## 2019-08-12 ENCOUNTER — Encounter: Payer: Self-pay | Admitting: Pulmonary Disease

## 2019-08-12 ENCOUNTER — Other Ambulatory Visit: Payer: Self-pay

## 2019-08-12 ENCOUNTER — Ambulatory Visit (INDEPENDENT_AMBULATORY_CARE_PROVIDER_SITE_OTHER): Payer: 59 | Admitting: Pulmonary Disease

## 2019-08-12 DIAGNOSIS — J849 Interstitial pulmonary disease, unspecified: Secondary | ICD-10-CM

## 2019-08-12 DIAGNOSIS — J679 Hypersensitivity pneumonitis due to unspecified organic dust: Secondary | ICD-10-CM | POA: Diagnosis not present

## 2019-08-12 MED ORDER — PREDNISONE 10 MG PO TABS: ORAL_TABLET | ORAL | 0 refills | Status: DC

## 2019-08-12 NOTE — Progress Notes (Signed)
Virtual Visit via Telephone Note  I connected with [name] on 08/12/19 at  9:15 AM EST by telephone and verified that I am speaking with the correct person using two identifiers.  Location: Patient: Home Provider: [facility], [place], [place]   I discussed the limitations, risks, security and privacy concerns of performing an evaluation and management service by telephone and the availability of in person appointments. I also discussed with the patient that there may be a patient responsible charge related to this service. The patient expressed understanding and agreed to proceed.  History of Present Illness: Chief complaint: Follow-up for interstitial lung disease, hypersensitivity pneumonitis.  HPI: 43 year old with history of fibromyalgia, depression, anxiety Referred for evaluation of abnormal CT scan  Complains of increasing dyspnea with chest pressure since mid August 2020.  She had a CTA done by her primary care recently which did not show any embolism but showed bilateral diffuse pulmonary infiltrates.  COVID-19 test was negative.  She has been treated with doxycycline and azithromycin with no improvement in symptoms Follow-up chest x-ray at her primary care yesterday apparently showed persistent interstitial abnormalities and she was given Symbicort, nebs and started on levofloxacin.  Referred to pulmonary for further evaluation.  She has an echo with shunt study and HIV test pending at primary care.  Per the patient she has unspecified autoimmune disease and has been referred to Dr. Trudie Reed, rheumatology.  She had a work-up done including labs which were reportedly negative.  Interim history:  She underwent bronchoscopy on 07/12/2019.  Started on 40 mg of prednisone on 11/19 for findings suggestive of hypersensitivity pneumonitis Dose reduced to 30 mg in early December  She is tolerating this dose well.    Overall her breathing is improving.  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  Hold at this dose until she can be reevaluated in clinic in 4 weeks PFTs scheduled for January  Skin rash Not sure if this is related to the prednisone She has history of rosacea and will follow up with her dermatologist to get this evaluated.  Assessment and Plan: Prednisone taper Follow-up with dermatology PFTs  Follow Up Instructions: Follow-up in 4 weeks   I discussed the assessment and treatment plan with the patient. The patient was provided an opportunity to ask questions and all were answered. The patient agreed with the plan and demonstrated an understanding of the instructions.   The patient was advised to call back or seek an in-person evaluation if the symptoms worsen or if the condition fails to improve as anticipated.  I provided 25 minutes of non-face-to-face time during this encounter.   Marshell Garfinkel MD [facility] 08/12/2019, 9:31 AM

## 2019-08-12 NOTE — Patient Instructions (Signed)
Reduce the prednisone to 20 mg a day for 2 weeks and then to 10 mg a day Please check with your dermatologist to get the rash evaluated Continue at this dose until you can have a follow-up visit again We will schedule a follow-up visit after you have pulmonary function test on 1/25

## 2019-08-15 ENCOUNTER — Other Ambulatory Visit: Payer: Self-pay | Admitting: Family Medicine

## 2019-08-25 LAB — ACID FAST CULTURE WITH REFLEXED SENSITIVITIES (MYCOBACTERIA): Acid Fast Culture: NEGATIVE

## 2019-08-29 ENCOUNTER — Encounter: Payer: Self-pay | Admitting: Primary Care

## 2019-08-29 ENCOUNTER — Telehealth (INDEPENDENT_AMBULATORY_CARE_PROVIDER_SITE_OTHER): Payer: 59 | Admitting: Primary Care

## 2019-08-29 DIAGNOSIS — J209 Acute bronchitis, unspecified: Secondary | ICD-10-CM

## 2019-08-29 DIAGNOSIS — J849 Interstitial pulmonary disease, unspecified: Secondary | ICD-10-CM | POA: Diagnosis not present

## 2019-08-29 MED ORDER — BENZONATATE 200 MG PO CAPS: 200.0000 mg | ORAL_CAPSULE | Freq: Three times a day (TID) | ORAL | 1 refills | Status: DC | PRN

## 2019-08-29 MED ORDER — ALBUTEROL SULFATE HFA 108 (90 BASE) MCG/ACT IN AERS: 2.0000 | INHALATION_SPRAY | Freq: Four times a day (QID) | RESPIRATORY_TRACT | 2 refills | Status: DC | PRN

## 2019-08-29 MED ORDER — BUDESONIDE-FORMOTEROL FUMARATE 160-4.5 MCG/ACT IN AERO: INHALATION_SPRAY | RESPIRATORY_TRACT | 6 refills | Status: DC

## 2019-08-29 NOTE — Patient Instructions (Signed)
 -   Recommend using Symbicort 160 two puffs twice daily scheduled with spacer (rinse mouth after use) - Use Albuterol rescue inhaler 2 puffs every 6 hours as needed for breakthrough shortness of breath/wheezing  - Continue prednisone 10mg  daily until follow-up with Dr. - Take delsym every 12 hours and tessalon perles up to three times a day as needed for cough suppression   Follow-up - As scheduled Jan 25/26; or sooner if needed

## 2019-08-29 NOTE — Progress Notes (Signed)
Virtual Visit via Video Note  I connected with [name] on 08/29/19 at  3:30 PM EST by a video enabled telemedicine application and verified that I am speaking with the correct person using two identifiers.  Location: Patient: Home Provider: Office   I discussed the limitations of evaluation and management by telemedicine and the availability of in person appointments. The patient expressed understanding and agreed to proceed.  History of Present Illness: 44 year old female, never smoked.  Past medical history significant for bilateral pneumonia, ILD, allergic rhinitis, hypothyroidism, fatigue, depression with anxiety.  Patient of [name], last seen 08/12/19.  She is being treated for hypersensitivity pneumonitis or eosinophilic pneumonia given mildly elevated peripheral eosinophils and exposure to down pillows and comforter.  IgE 162. No PFTs on file.   Originally referred back in November 2020 for abnormal CT and increasing dyspnea since August 2020.  She had a CTA done by her primary care which showed no evidence of the pulmonary embolism however did show bilateral diffuse pulmonary infiltrates.  She was treated with course of doxycycline and azithromycin with no improvement.  COVID-19 was negative.  Follow-up chest x-ray showed persistent interstitial abnormalities.  She was given Levaquin and started on Symbicort 160 and nebulizers.  Per patient she has unspecific autoimmune disease and referred to Dr. Nickola Major.  She has had negative lab work-up.  She had bronchoscopy done on 06/1719, pathology potential compatible with subacute hypersensitivity pneumonitis.  Differential diagnosis includes sarcoidosis, aspiration and infection. She was started on 40 mg of prednisone on 11/19.  During her last visit with [name] her prednisone was decreased to 20 mg for 2 weeks and then patient was to remain on 10 mg daily until next follow-up at the end of January where she will also have full pulmonary  function tests.    08/29/2019 Patient contacted today for acute visit with complaints of ongoing wheezing and nonproductive cough.  Her prednisone was tapered to 10 mg daily which she started on December 1st. She has been using Symbicort inhaler irregularly, more like a rescue inhaler.  She typically takes 1 puff 3 times a day.  She reports dry coughing spells which started in December.  She does use a spacer with her Symbicort.  She frequently has coughing fits when speaking.  She has not tried taking anything over-the-counter for cough.  She did see a dermatologist for facial rash which is felt to be related to prednisone.  She was prescribed doxycycline 100 mg twice daily for 1 month and she will follow-up with them as needed. Denies fever, chest pain, hemoptysis or weight loss.    Observations/Objective:  -Appears well, able to speak in full sentences -No overt shortness of breath, wheezing -Occasional dry cough while talking   Data Reviewed: Imaging: CTA 06/15/2019-no pulmonary embolism, mild diffuse interstitial prominence with bilateral groundglass and nodular airspace opacities.  I have reviewed the images personally.  Labs: ANA 03/25/2019-negative,  Rheumatoid factor 03/25/2019- < 14  From primary care Covid 19 testing 06/18/2019-negative  CBC 06/15/2019-WBC 5.87, eos 6.4%, absolute eosinophil count 376 Metabolic panel 06/15/2019-within normal limits D-dimer 06/30/2019-0.41 BNP 06/30/2019-10.1 IgE 07/01/19 - 162  Surgical path 07/12/19- LUNG, RIGHT LOWER LOBE, BIOPSY:  - Bronchial mucosa and adjacent lung parenchyma with reactive changes  and a couple of nonnecrotizing epithelioid cell granulomas.  Though not diagnostic, the findings can be compatible with the clinical  suspicion of subacute hypersensitivity pneumonitis. Differential  diagnosis includes sarcoidosis, aspiration and infection  Exposure history: pets: Has dogs.  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  She has it down comforter for many years and got a down pillow 1 year ago Smoking history: No significant smoking history Travel history: Previously lived in [place], [place].  Has been living in [place] for the past 18 years.  No significant recent travel Relevant family history: Grandfather died of lung cancer.  No other significant family history of lung disease  Assessment and Plan:  Interstitial lung disease, hypersensitivity pneumonitis: -CT showed bilateral interstitial abnormalities with nodular airspace opacities which have persisted after multiple rounds of antibiotics -Surgical pathology from bronchoscopy on 07/12/2019 appear consistent with hypersensitivity pneumonitis -She was treated with 40 mg prednisone daily for 2 weeks; then 20 mg x 2 weeks; and now remains on 10 mg daily until next follow-up in January with Dr. Vaughan Browner - Likely will need repeat imaging after completing prednisone course, recommend patient discuss with Dr. Vaughan Browner at her next office visit   Cough with bronchospasm: - Possible underlying asthma, elevated eosinophils and IgE -She was started on Symbicort 160 by her PCP which Dr. Vaughan Browner recommended to continue   -Recommend patient consistently use Symbicort 160 two puffs twice daily with spacer (rinse mouth after use) -RX albuterol rescue inhaler to have on hand for breakthrough shortness of breath, wheezing or chest tightness -Recommend Delsym cough syrup twice daily and Tessalon Perles 200 mg 3 times daily for cough suppression -Awaiting full PFTs to be completed in January  Follow Up Instructions:   -Patient has an appointment for PFTs scheduled on January 25 and a follow-up office visit on January 26 with Dr. Vaughan Browner .  Contact office sooner if symptoms worsen.   I discussed the assessment and treatment plan with the patient. The patient was provided an  opportunity to ask questions and all were answered. The patient agreed with the plan and demonstrated an understanding of the instructions.   The patient was advised to call back or seek an in-person evaluation if the symptoms worsen or if the condition fails to improve as anticipated.  I provided 25 minutes of non-face-to-face time during this encounter.   Martyn Ehrich, NP

## 2019-09-04 ENCOUNTER — Other Ambulatory Visit: Payer: Self-pay | Admitting: Pulmonary Disease

## 2019-09-06 NOTE — Progress Notes (Signed)
   Interstitial Lung Disease Multidisciplinary Conference   [name]    MRN [id number]    DOB 1976/02/28  Primary Care Physician:Skakle, Liane Comber, DO  Referring Physician:   Time of Conference: 7.30am- 8.30am Date of conference: 08/02/2019 Location of Conference: -  Virtual  Participating Pulmonary: Dr. Brand Males, MD,  Dr Marshell Garfinkel, MD Pathology: Dr Jaquita Folds, MD Radiology: Dr Eddie Candle Others:   Brief History: 44 year old with history of fibromyalgia, depression, anxiety Referred for evaluation of abnormal CT scan She underwent bronchoscopy on 07/12/2019. Started on 40 mg of prednisone on 11/19 for findings suggestive of hypersensitivity pneumonitis  Serology:  ANA 03/25/2019-negative,  Rheumatoid factor 03/25/2019- < 14 Repeat CTD serologies 07/18/2019-negative, hypersensitivity panel-negative  MDD discussion of CT scan   07/01/2019 Bilateral fine nodularity with groundglass with no significant air trapping.  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  Pathology discussion of biopsy: Transbronchial biopsy 07/12/2019-reactive changes in lung parenchyma with nonnecrotizing epithelioid cell granulomas.  Labs:  Bronchoscopy 07/12/2019 WBC 510, lymphocytes 38%, eos 12%, Microbiology-negative Cytology -no malignancy Transbronchial biopsy-nonnecrotizing epithelioid granulomas SARS-CoV-2, viral PCR-negative  MDD Impression/Recs:  CT scan is not very typical of hypersensitivity pneumonitis, with BAL lymphocytosis, granulomas and known exposure to down pillows we can make a diagnosis of hypersensitivity pneumonitis with moderate confidence.  Time Spent in preparation and discussion:  > 30 min    SIGNATURE   Marshell Garfinkel MD [facility] 09/06/2019, 3:20 PM ...................................................................................................................Marland Kitchen References: Diagnosis of Hypersensitivity Pneumonitis in Adults.  An Official ATS/JRS/ALAT Clinical Practice Guideline. Ragu G et al, [place] Aug 1;202(3):e36-e69.       Diagnosis of Idiopathic Pulmonary Fibrosis. An Official ATS/ERS/JRS/ALAT Clinical Practice Guideline. Raghu G et al, [place]. 2018 Sep 1;198(5):e44-e68.   IPF Suspected   Histopath ology Pattern      UIP  Probable UIP  Indeterminate for  UIP  Alternative  diagnosis    UIP  IPF  IPF  IPF  Non-IPF dx   HRCT   Probabe UIP  IPF  IPF  IPF (Likely)**  Non-IPF dx  Pattern  Indeterminate for UIP  IPF  IPF (Likely)**  Indeterminate  for IPF**  Non-IPF dx    Alternative diagnosis  IPF (Likely)**/ non-IPF dx  Non-IPF dx  Non-IPF dx  Non-IPF dx     Idiopathic pulmonary fibrosis diagnosis based upon HRCT and Biopsy paterns.  ** IPF is the likely diagnosis when any of following features are present:  . Moderate-to-severe traction bronchiectasis/bronchiolectasis (defined as mild traction bronchiectasis/bronchiolectasis in four or more lobes including the lingual as a lobe, or moderate to severe traction bronchiectasis in two or more lobes) in a man over age 56 years or in a woman over age 7 years . Extensive (>30%) reticulation on HRCT and an age >70 years  . Increased neutrophils and/or absence of lymphocytosis in BAL fluid  . Multidisciplinary discussion reaches a confident diagnosis of IPF.   **Indeterminate for IPF  . Without an adequate biopsy is unlikely to be IPF  . With an adequate biopsy may be reclassified to a more specific diagnosis after multidisciplinary discussion and/or additional consultation.   dx = diagnosis; HRCT = high-resolution computed tomography; IPF = idiopathic pulmonary fibrosis; UIP = usual interstitial pneumonia.

## 2019-09-12 ENCOUNTER — Encounter: Payer: Self-pay | Admitting: Internal Medicine

## 2019-09-12 ENCOUNTER — Ambulatory Visit (INDEPENDENT_AMBULATORY_CARE_PROVIDER_SITE_OTHER): Payer: 59 | Admitting: Medical

## 2019-09-12 ENCOUNTER — Telehealth: Payer: Self-pay | Admitting: *Deleted

## 2019-09-12 ENCOUNTER — Other Ambulatory Visit: Payer: Self-pay | Admitting: Nurse Practitioner

## 2019-09-12 ENCOUNTER — Encounter: Payer: Self-pay | Admitting: Primary Care

## 2019-09-12 ENCOUNTER — Telehealth (INDEPENDENT_AMBULATORY_CARE_PROVIDER_SITE_OTHER): Payer: 59 | Admitting: Primary Care

## 2019-09-12 VITALS — BP 132/86 | HR 114 | Temp 98.3°F | Wt 191.6 lb

## 2019-09-12 DIAGNOSIS — E039 Hypothyroidism, unspecified: Secondary | ICD-10-CM | POA: Diagnosis not present

## 2019-09-12 DIAGNOSIS — J849 Interstitial pulmonary disease, unspecified: Secondary | ICD-10-CM | POA: Diagnosis not present

## 2019-09-12 DIAGNOSIS — E860 Dehydration: Secondary | ICD-10-CM | POA: Diagnosis not present

## 2019-09-12 DIAGNOSIS — U071 COVID-19: Secondary | ICD-10-CM

## 2019-09-12 DIAGNOSIS — Z79899 Other long term (current) drug therapy: Secondary | ICD-10-CM

## 2019-09-12 DIAGNOSIS — R0602 Shortness of breath: Secondary | ICD-10-CM

## 2019-09-12 DIAGNOSIS — R5383 Other fatigue: Secondary | ICD-10-CM

## 2019-09-12 NOTE — Progress Notes (Addendum)
Virtual Visit via Video Note  I connected with [name] on 09/12/19 at 12:00 PM EST by a video enabled telemedicine application and verified that I am speaking with the correct person using two identifiers.  Location: Patient: Home Provider: Office    I discussed the limitations of evaluation and management by telemedicine and the availability of in person appointments. The patient expressed understanding and agreed to proceed.  History of Present Illness: 44 year old female, never smoked.  Past medical history significant for bilateral pneumonia, ILD, allergic rhinitis, hypothyroidism, fatigue, depression with anxiety.  Patient of [name] (seen 08/12/19).  She is being treated for hypersensitivity pneumonitis or eosinophilic pneumonia given mildly elevated peripheral eosinophils and exposure to down pillows and comforter.  IgE 162. No PFTs on file. Maintained on Prednisone 10mg  daily. On doxycycline 100mg  twice daily per dermatology.   Originally referred back in November 2020 for abnormal CT and increasing dyspnea since August 2020.  She had a CTA done by her primary care which showed no evidence of the pulmonary embolism however did show bilateral diffuse pulmonary infiltrates.  She was treated with course of doxycycline and azithromycin with no improvement.  COVID-19 was negative.  Follow-up chest x-ray showed persistent interstitial abnormalities.  She was given Levaquin and started on Symbicort 160 and nebulizers.  Per patient she has unspecific autoimmune disease and referred to Dr. December 2020.  She has had negative lab work-up.  She had bronchoscopy done on 06/1719, pathology potential compatible with subacute hypersensitivity pneumonitis.  Differential diagnosis includes sarcoidosis, aspiration and infection. She was started on 40 mg of prednisone on 11/19.  During her last visit with Dr. 07/1719 her prednisone was decreased to 20 mg for 2 weeks and then patient was to remain on 10 mg daily  until next follow-up at the end of January where she will also have full pulmonary function tests.    Previous [facility] pulmonary encounter: 08/29/2019- Acute visit, NP cough and wheezing Patient contacted today for acute video visit with complaints of ongoing wheezing and nonproductive cough.  Her prednisone was tapered to 10 mg daily which she started on December 1st. She has been using Symbicort inhaler irregularly, more like a rescue inhaler.  She typically takes 1 puff 3 times a day.  She reports dry coughing spells which started in December.  She does use a spacer with her Symbicort.  She frequently has coughing fits when speaking.  She has not tried taking anything over-the-counter for cough.  She did see a dermatologist for facial rash which is felt to be related to prednisone.  She was prescribed doxycycline 100 mg twice daily for 1 month and she will follow-up with them as needed. Denies fever, chest pain, hemoptysis or weight loss.   09/12/2019 Patient contacted today for acute video visit. Patient recently tested positive for Covid on 09/07/19 at a [facility] in [place] after known exposure. She reports that her her husband tested positive on 1/11 after having a sore throat. She developed headache, body aches, sinus pressure and runny nose 4 days ago on Thursday 1/14 and lost her smell/taste the following day. She has new moderate exertional dyspnea. Difficulty taking full deep breath, more labored breathing per her husband. Using Symbicort 160 two puffs twice daily and is on prednisone 10mg  daily for HP which is being treated by Dr. 3/11. She has used her albuterol rescue inhaler twice since last visit. She does not have acute shortness of breath at rest. She does not have a pulse oximeter  at home to check her oxygen level. Denies fever.    Observations/Objective:  - Appears well on video visit, able to speak in full sentences - Some mild dyspnea with associated anxiety   Assessment and  Plan:  COVID-19 - DX with Covid on 09/07/19 after exposure to husband, symptoms started on 09/08/19  - Reports headache, body aches, sinus pressure, runny nose and dyspnea - Will discuss positive covid results with Covid team member to see if she qualified for monoclonal antibody infusion - She has an apt with respiratory clinic this evening to ensure patient can be managed outpatient (recommend CXR- may need additional prednisone taper or decadron) - RX prednisone taper sent 09/12/2019   ILD: - She is at higher risk for complication d/t underlying lung diease/ILD and hypersensitivity pneumonitis  - Maintained on Symbicort 160 2 bid and currently on 10mg  prednisone daily - CT showed bilateral interstitial abnormalities with nodular airspace opacities which have persisted after multiple rounds of antibiotics. Surgical pathology from bronchoscopy on 07/12/2019 appear consistent with hypersensitivity pneumonitis -She was treated with 40 mg prednisone daily for 2 weeks; then 20 mg x 2 weeks; and now remains on 10 mg daily until next follow-up in January with Dr. Vaughan Browner - Likely will need repeat imaging after completing prednisone course   Follow Up Instructions:   - FU in 1 week virtual visit   I discussed the assessment and treatment plan with the patient. The patient was provided an opportunity to ask questions and all were answered. The patient agreed with the plan and demonstrated an understanding of the instructions.   The patient was advised to call back or seek an in-person evaluation if the symptoms worsen or if the condition fails to improve as anticipated.  I provided 25 minutes of non-face-to-face time during this encounter.   Martyn Ehrich, NP

## 2019-09-12 NOTE — Patient Instructions (Signed)
Recommendations: Continue Symbicort two puffs twice daily Use albuterol rescue inhaler 2 puffs every 4-6 hours for shortness of breath/chest tightness Tylenol around the clock for headache Monitor O2 level and if <90 present to ED/call office  Follow-up: Scheduled for apt with respiratory clinic this evening Follow up in 1 week virtual visit with Beth NP     COVID-19 COVID-19 is a respiratory infection that is caused by a virus called severe acute respiratory syndrome coronavirus 2 (SARS-CoV-2). The disease is also known as coronavirus disease or novel coronavirus. In some people, the virus may not cause any symptoms. In others, it may cause a serious infection. The infection can get worse quickly and can lead to complications, such as:  Pneumonia, or infection of the lungs.  Acute respiratory distress syndrome or ARDS. This is a condition in which fluid build-up in the lungs prevents the lungs from filling with air and passing oxygen into the blood.  Acute respiratory failure. This is a condition in which there is not enough oxygen passing from the lungs to the body or when carbon dioxide is not passing from the lungs out of the body.  Sepsis or septic shock. This is a serious bodily reaction to an infection.  Blood clotting problems.  Secondary infections due to bacteria or fungus.  Organ failure. This is when your body's organs stop working. The virus that causes COVID-19 is contagious. This means that it can spread from person to person through droplets from coughs and sneezes (respiratory secretions). What are the causes? This illness is caused by a virus. You may catch the virus by:  Breathing in droplets from an infected person. Droplets can be spread by a person breathing, speaking, singing, coughing, or sneezing.  Touching something, like a table or a doorknob, that was exposed to the virus (contaminated) and then touching your mouth, nose, or eyes. What increases the  risk? Risk for infection You are more likely to be infected with this virus if you:  Are within 6 feet (2 meters) of a person with COVID-19.  Provide care for or live with a person who is infected with COVID-19.  Spend time in crowded indoor spaces or live in shared housing. Risk for serious illness You are more likely to become seriously ill from the virus if you:  Are 34 years of age or older. The higher your age, the more you are at risk for serious illness.  Live in a nursing home or long-term care facility.  Have cancer.  Have a long-term (chronic) disease such as: ? Chronic lung disease, including chronic obstructive pulmonary disease or asthma. ? A long-term disease that lowers your body's ability to fight infection (immunocompromised). ? Heart disease, including heart failure, a condition in which the arteries that lead to the heart become narrow or blocked (coronary artery disease), a disease which makes the heart muscle thick, weak, or stiff (cardiomyopathy). ? Diabetes. ? Chronic kidney disease. ? Sickle cell disease, a condition in which red blood cells have an abnormal "sickle" shape. ? Liver disease.  Are obese. What are the signs or symptoms? Symptoms of this condition can range from mild to severe. Symptoms may appear any time from 2 to 14 days after being exposed to the virus. They include:  A fever or chills.  A cough.  Difficulty breathing.  Headaches, body aches, or muscle aches.  Runny or stuffy (congested) nose.  A sore throat.  New loss of taste or smell. Some people may also have  stomach problems, such as nausea, vomiting, or diarrhea. Other people may not have any symptoms of COVID-19. How is this diagnosed? This condition may be diagnosed based on:  Your signs and symptoms, especially if: ? You live in an area with a COVID-19 outbreak. ? You recently traveled to or from an area where the virus is common. ? You provide care for or live with  a person who was diagnosed with COVID-19. ? You were exposed to a person who was diagnosed with COVID-19.  A physical exam.  Lab tests, which may include: ? Taking a sample of fluid from the back of your nose and throat (nasopharyngeal fluid), your nose, or your throat using a swab. ? A sample of mucus from your lungs (sputum). ? Blood tests.  Imaging tests, which may include, X-rays, CT scan, or ultrasound. How is this treated? At present, there is no medicine to treat COVID-19. Medicines that treat other diseases are being used on a trial basis to see if they are effective against COVID-19. Your health care provider will talk with you about ways to treat your symptoms. For most people, the infection is mild and can be managed at home with rest, fluids, and over-the-counter medicines. Treatment for a serious infection usually takes places in a hospital intensive care unit (ICU). It may include one or more of the following treatments. These treatments are given until your symptoms improve.  Receiving fluids and medicines through an IV.  Supplemental oxygen. Extra oxygen is given through a tube in the nose, a face mask, or a hood.  Positioning you to lie on your stomach (prone position). This makes it easier for oxygen to get into the lungs.  Continuous positive airway pressure (CPAP) or bi-level positive airway pressure (BPAP) machine. This treatment uses mild air pressure to keep the airways open. A tube that is connected to a motor delivers oxygen to the body.  Ventilator. This treatment moves air into and out of the lungs by using a tube that is placed in your windpipe.  Tracheostomy. This is a procedure to create a hole in the neck so that a breathing tube can be inserted.  Extracorporeal membrane oxygenation (ECMO). This procedure gives the lungs a chance to recover by taking over the functions of the heart and lungs. It supplies oxygen to the body and removes carbon dioxide. Follow  these instructions at home: Lifestyle  If you are sick, stay home except to get medical care. Your health care provider will tell you how long to stay home. Call your health care provider before you go for medical care.  Rest at home as told by your health care provider.  Do not use any products that contain nicotine or tobacco, such as cigarettes, e-cigarettes, and chewing tobacco. If you need help quitting, ask your health care provider.  Return to your normal activities as told by your health care provider. Ask your health care provider what activities are safe for you. General instructions  Take over-the-counter and prescription medicines only as told by your health care provider.  Drink enough fluid to keep your urine pale yellow.  Keep all follow-up visits as told by your health care provider. This is important. How is this prevented?  There is no vaccine to help prevent COVID-19 infection. However, there are steps you can take to protect yourself and others from this virus. To protect yourself:   Do not travel to areas where COVID-19 is a risk. The areas where COVID-19 is  reported change often. To identify high-risk areas and travel restrictions, check the CDC travel website: StageSync.si  If you live in, or must travel to, an area where COVID-19 is a risk, take precautions to avoid infection. ? Stay away from people who are sick. ? Wash your hands often with soap and water for 20 seconds. If soap and water are not available, use an alcohol-based hand sanitizer. ? Avoid touching your mouth, face, eyes, or nose. ? Avoid going out in public, follow guidance from your state and local health authorities. ? If you must go out in public, wear a cloth face covering or face mask. Make sure your mask covers your nose and mouth. ? Avoid crowded indoor spaces. Stay at least 6 feet (2 meters) away from others. ? Disinfect objects and surfaces that are frequently touched  every day. This may include:  Counters and tables.  Doorknobs and light switches.  Sinks and faucets.  Electronics, such as phones, remote controls, keyboards, computers, and tablets. To protect others: If you have symptoms of COVID-19, take steps to prevent the virus from spreading to others.  If you think you have a COVID-19 infection, contact your health care provider right away. Tell your health care team that you think you may have a COVID-19 infection.  Stay home. Leave your house only to seek medical care. Do not use public transport.  Do not travel while you are sick.  Wash your hands often with soap and water for 20 seconds. If soap and water are not available, use alcohol-based hand sanitizer.  Stay away from other members of your household. Let healthy household members care for children and pets, if possible. If you have to care for children or pets, wash your hands often and wear a mask. If possible, stay in your own room, separate from others. Use a different bathroom.  Make sure that all people in your household wash their hands well and often.  Cough or sneeze into a tissue or your sleeve or elbow. Do not cough or sneeze into your hand or into the air.  Wear a cloth face covering or face mask. Make sure your mask covers your nose and mouth. Where to find more information  Centers for Disease Control and Prevention: StickerEmporium.tn  World Health Organization: https://thompson-craig.com/ Contact a health care provider if:  You live in or have traveled to an area where COVID-19 is a risk and you have symptoms of the infection.  You have had contact with someone who has COVID-19 and you have symptoms of the infection. Get help right away if:  You have trouble breathing.  You have pain or pressure in your chest.  You have confusion.  You have bluish lips and fingernails.  You have difficulty waking from sleep.  You  have symptoms that get worse. These symptoms may represent a serious problem that is an emergency. Do not wait to see if the symptoms will go away. Get medical help right away. Call your local emergency services (911 in the [place] Do not drive yourself to the hospital. Let the emergency medical personnel know if you think you have COVID-19. Summary  COVID-19 is a respiratory infection that is caused by a virus. It is also known as coronavirus disease or novel coronavirus. It can cause serious infections, such as pneumonia, acute respiratory distress syndrome, acute respiratory failure, or sepsis.  The virus that causes COVID-19 is contagious. This means that it can spread from person to person through droplets from  breathing, speaking, singing, coughing, or sneezing.  You are more likely to develop a serious illness if you are 64 years of age or older, have a weak immune system, live in a nursing home, or have chronic disease.  There is no medicine to treat COVID-19. Your health care provider will talk with you about ways to treat your symptoms.  Take steps to protect yourself and others from infection. Wash your hands often and disinfect objects and surfaces that are frequently touched every day. Stay away from people who are sick and wear a mask if you are sick. This information is not intended to replace advice given to you by your health care provider. Make sure you discuss any questions you have with your health care provider. Document Revised: 06/10/2019 Document Reviewed: 09/16/2018 Elsevier Patient Education  2020 [employer].

## 2019-09-12 NOTE — Telephone Encounter (Signed)
error 

## 2019-09-12 NOTE — Telephone Encounter (Signed)
Thanks, needed for documentation for infusion

## 2019-09-12 NOTE — Progress Notes (Signed)
Subjective: Chief Complaint  Patient presents with  . Follow-up    +covid,sob,no taste/smell, chest pressure migraine, fatigue    PCP is Dr. Charlane Ferretti, but referred by pulmonology [name]  Here for covid symptoms.   +covid test last week.  She was already recuperating from pneumonia (June 16, 2019) and interstitial lung disease (06/2019) prior when she was diagnosed with covid 09/07/2019 last week.   This past week has had fatigue, headaches constantly, chest pain has come back, labored breathing, runny nose, lost sense of taste and smell 3 days ago, had some aches and pains but that subsided a bit.   Has continued to feel SOB.  No fever.   Has tested twice daily and no fever.   No nausea, no vomiting, no diarrhea.   No sore throat.   Has pressure in back of throat.   Currently using daily medications, plus prednisone 10mg  once daily, but pulmonologist was going to consider increasing prednisone to 40mg  daily for the next week or 2.    On Sybmiciort 2 puffs BID, rescue inhaler, hasn't had to use a lot since last Wednesday.  On Doxycycline for skin issues on face per dermatology.    Using home pulse ox for first time today.  97% on room air at home today.   She is not on oxygen.  She is a lifelong nonsmoker.     Prior to August this past year did not have a history of asthma or lung disease.    No hx/o DVT, PE.   No calve pain, no long travel.  No recent injury or trauma.  No other aggravating or relieving factors. No other complaint.  Past Medical History:  Diagnosis Date  . Anxiety   . Arthritis   . Depression   . Fibromyalgia   . Hypothyroidism 1999   post PTU Rx for hyperthyroidism  . IBS (irritable bowel syndrome)    Current Outpatient Medications on File Prior to Visit  Medication Sig Dispense Refill  . acetaminophen (TYLENOL) 650 MG CR tablet Take 650 mg by mouth every 8 (eight) hours as needed for pain.    Friday albuterol (VENTOLIN HFA) 108 (90 Base) MCG/ACT inhaler Inhale  2 puffs into the lungs every 6 (six) hours as needed for wheezing or shortness of breath. 8 g 2  . benzonatate (TESSALON) 200 MG capsule Take 1 capsule (200 mg total) by mouth 3 (three) times daily as needed for cough. 30 capsule 1  . budesonide-formoterol (SYMBICORT) 160-4.5 MCG/ACT inhaler Take one to two puffs twice daily. 1 Inhaler 6  . Cholecalciferol (VITAMIN D) 50 MCG (2000 UT) tablet Take 2,000 Units by mouth daily in the afternoon.    . DULoxetine (CYMBALTA) 60 MG capsule TAKE 1 CAPSULE BY MOUTH EVERY DAY (Patient taking differently: Take 60 mg by mouth daily. ) 90 capsule 2  . levothyroxine (SYNTHROID) 137 MCG tablet TAKE 1 TABLET (137 MCG TOTAL) BY MOUTH DAILY BEFORE BREAKFAST. 90 tablet 1  . predniSONE (DELTASONE) 10 MG tablet Please take 20mg  daily for 2 weeks, then 10mg  daily 42 tablet 0  . pregabalin (LYRICA) 150 MG capsule Take 1 capsule (150 mg total) by mouth 2 (two) times daily. 60 capsule 3  . ipratropium-albuterol (DUONEB) 0.5-2.5 (3) MG/3ML SOLN Take 3 mLs by nebulization every 4 (four) hours as needed (shortness of breath).      No current facility-administered medications on file prior to visit.     Objective: BP 132/86   Pulse (!) 114  Temp 98.3 F (36.8 C)   Wt 191 lb 9.6 oz (86.9 kg)   SpO2 97%   BMI 30.93 kg/m   Wt Readings from Last 3 Encounters:  09/12/19 191 lb 9.6 oz (86.9 kg)  07/27/19 191 lb 9.6 oz (86.9 kg)  07/01/19 192 lb 6.4 oz (87.3 kg)    General Appearance:  White female in no acute distress, pleasant, talking in complete sentences Pulses 2+ UE and LE, cap refill normal Ext: no edema, no calve tendnerss, no asymmetry, neg homans Oral: somewhat dry, no lesions, pharynx normal  Eyes:    PERRL, conjunctiva/corneas pink and somewhat pale, EOM's intact       Lungs:     Clear to auscultation bilaterally, respirations unlabored, no wheezes, no rhonchi  Heart:    Tachycardic. Normal rhythm. No murmurs, rubs, or gallops.   MS:   All extremities  are intact.   Neurologic:   Awake, alert, oriented x 3. No apparent focal neurological           defect.        Assessment: Encounter Diagnoses  Name Primary?  . COVID-19 virus infection Yes  . Dehydration   . ILD (interstitial lung disease) ([place])   . Hypothyroidism, unspecified type   . SOB (shortness of breath)   . Fatigue, unspecified type     Plan: I reviewed her pulmonology note from earlier today.    She appears to be tachycardic from some dehydration. She doesn't appear to be so severe to go to the ED.   She will go for chest xray tomorrow morning first thing.     advised of the importance of hydration!   She will work on this, rest, continue current medications but increase prednisone to 40mg  daily the next 7 days.  She has enough prednisone at home for this increase currently she states.    She is set up for infusion for covid therapy in 2 days already  Discussed quarantine.  Discussed that fatigue and recuperation from covid will take weeks.  Discussed symptoms that would prompt visit to the emergency dept. Discussed signs/symptoms of DVT/PE.    F/u pending xray tomorrow and f/u with pulmonology in a few days by phone.     [name] was seen today for follow-up.  Diagnoses and all orders for this visit:  COVID-19 virus infection -     DG Chest 2 View; Future -     Temperature monitoring; Future  Dehydration -     DG Chest 2 View; Future  ILD (interstitial lung disease) ([place]) -     DG Chest 2 View; Future  Hypothyroidism, unspecified type -     DG Chest 2 View; Future  SOB (shortness of breath) -     DG Chest 2 View; Future  Fatigue, unspecified type -     DG Chest 2 View; Future  Other orders -     [place]

## 2019-09-12 NOTE — Patient Instructions (Signed)
Currently your pulse oxygen is ok, and your other vitals signs are ok except pulse rate elevated.  I believe this is related to some dehydration.  I am less concerned about blood clot.   Recommendations:  Go tomorrow morning after 8am to [facility] for [facility] xray.  Wear your mask  significantly increase your water intake over the next several days  Try to get 64 oz + clear liquid intake daily  REST  Continue your current medications  Increase Prednisone to 40mg  daily for the next week, and follow up by phone with pulmonology in a few days.  We will call with xray results   Covid symptoms can include fever, tiredness, body aches, cough, sore throat, diarrhea, headache, loss of taste or smell, shortness of breath, rash, and discoloration of fingers or toes.    Risk factors that may put someone at risk for worse outcome with covid infection includes older than 45 years old, underlying health conditions like COPD, heart failure, asthma, diabetes, hypertension, kidney disease, obesity, and history of heart disease or stroke.  Currently your symptoms seem moderate   General recommendations: I recommend you rest, hydrate well with water and clear fluids throughout the day.   Drink enough water and clear fluids so that your urine is clear.   You can use Tylenol for pain or fever every 4-6 hours. You can use over the counter Emetrol for nausea.     If you need any medications from the pharmacy, have a friend or family member pick them up from the pharmacy for you.  Have them drop off the medications at your home to keep you from having to go into the pharmacy and potentially exposure others.   If this is not possible, see if your pharmacy does home delivery.  Or worse case scenario, go through the drive through at your pharmacy, wear your mask, use hand sanitizer before touching anything in the drive through transaction, and limit interaction with the store personally, particularly  staying > 6 feet apart.  Over the next few days if you are having worse trouble breathing, if you are very weak, have persistent fever 101 or higher consistently despite Tylenol, uncontrollable nausea and vomiting, or feel very dehydrated, then call or go to the emergency department.    If you have other questions or have other symptoms or questions you are concerned about then please make a virtual visit with your primary care provider.  Covid symptoms such as fatigue and cough can linger over 2 weeks, even after the initial fever, aches, chills, and other initial symptoms.     Self Quarantine: The CDC, Centers for Disease Control has recommended a self quarantine of 10 -14 days from the start of your illness until you are symptom-free including at least 24 hours of no symptoms including no fever, no shortness of breath, and no body aches and chills, by day 10 before returning to work or general contact with the public.  What does self quarantine mean: avoiding contact with people as much as possible.   Particularly in your house, isolate your self from others in a separate room, wear a mask when possible in the room, particularly if coughing a lot.   Have others bring food, water, medications, etc., to your door, but avoid direct contact with your household contacts during this time to avoid spreading the infection to them.   If you have a separate bathroom and living quarters during the next 2 weeks away from  others, that would be preferable.    If you can't completely isolate, then wear a mask, wash hands frequently with soap and water for at least 15 seconds, minimize close contact with others, and have a friend or family member check regularly from a distance to make sure you are not getting seriously worse.     You should not be going out in public, should not be going to stores, to work or other public places until all your symptoms have resolved and at least 10 days + 24 hours of no symptoms  at all have transpired.   Ideally you should avoid contact with others for a full 10 days if possible.  One of the goals is to limit spread to high risk people; people that are older and elderly, people with multiple health issues like diabetes, heart disease, lung disease, and anybody that has weakened immune systems such as people with cancer or on immunosuppressive therapy.

## 2019-09-12 NOTE — Telephone Encounter (Signed)
Pt has attached her covid test results in the review media tab of the mychart message. Routing to [employer] as an Financial planner.

## 2019-09-12 NOTE — Progress Notes (Signed)
  I connected by phone with [name] on 09/12/2019 at 1:26 PM to discuss the potential use of an new treatment for mild to moderate COVID-19 viral infection in non-hospitalized patients.  This patient is a 44 y.o. female that meets the FDA criteria for Emergency Use Authorization of bamlanivimab or casirivimab\imdevimab.  Has a (+) direct SARS-CoV-2 viral test result  Has mild or moderate COVID-19   Is ? 45 years of age and weighs ? 40 kg  Is NOT hospitalized due to COVID-19  Is NOT requiring oxygen therapy or requiring an increase in baseline oxygen flow rate due to COVID-19  Is within 10 days of symptom onset  Has at least one of the high risk factor(s) for progression to severe COVID-19 and/or hospitalization as defined in EUA.  Specific high risk criteria : Currently receiving immunosuppressive treatment   Patient is currently being treated for ILD with long term steroids/prednisone/immunosupressive therapy Patient Active Problem List   Diagnosis Date Noted  . ILD (interstitial lung disease) (HCC)   . Edema 03/25/2019  . Skin lesions 03/25/2019  . 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 Poor concentration 10/15/2015  . Headache 06/20/2015  . PCO (polycystic ovaries) 03/02/2015  . Acne 01/31/2015  . Allergic rhinitis 01/05/2015  . High risk medications (not anticoagulants) long-term use 10/26/2013  . General medical examination 05/20/2011  . Foot pain, right 07/15/2010  . KNEE PAIN, LEFT 05/17/2010  . Depression with anxiety 04/19/2008  . B12 DEFICIENCY 03/17/2008  . Fatigue 02/14/2008  . Hypothyroidism 07/26/2007  . Fibromyalgia 07/26/2007  . IRRITABLE BOWEL SYNDROME, HX OF 07/26/2007     I have spoken and communicated the following to the patient or parent/caregiver:  1. FDA has authorized the emergency use of bamlanivimab and casirivimab\imdevimab for the treatment of mild to moderate COVID-19 in adults and pediatric patients with positive  results of direct SARS-CoV-2 viral testing who are 36 years of age and older weighing at least 40 kg, and who are at high risk for progressing to severe COVID-19 and/or hospitalization.  2. The significant known and potential risks and benefits of bamlanivimab and casirivimab\imdevimab, and the extent to which such potential risks and benefits are unknown.  3. Information on available alternative treatments and the risks and benefits of those alternatives, including clinical trials.  4. Patients treated with bamlanivimab and casirivimab\imdevimab should continue to self-isolate and use infection control measures (e.g., wear mask, isolate, social distance, avoid sharing personal items, clean and disinfect "high touch" surfaces, and frequent handwashing) according to CDC guidelines.   5. The patient or parent/caregiver has the option to accept or refuse bamlanivimab or casirivimab\imdevimab .  After reviewing this information with the patient, The patient agreed to proceed with receiving the bamlanimivab infusion and will be provided a copy of the Fact sheet prior to receiving the infusion.[name] 09/12/2019 1:26 PM   Note: Patient will need copy of positive COVID test scanned into chart.

## 2019-09-13 ENCOUNTER — Ambulatory Visit (HOSPITAL_COMMUNITY)
Admission: RE | Admit: 2019-09-13 | Discharge: 2019-09-13 | Disposition: A | Discharge status: Home / Self Care | Payer: 59 | Source: Ambulatory Visit | Attending: Medical | Admitting: Medical

## 2019-09-13 ENCOUNTER — Other Ambulatory Visit: Payer: Self-pay

## 2019-09-13 DIAGNOSIS — Z79899 Other long term (current) drug therapy: Secondary | ICD-10-CM | POA: Diagnosis not present

## 2019-09-13 DIAGNOSIS — R0602 Shortness of breath: Secondary | ICD-10-CM

## 2019-09-13 DIAGNOSIS — R5383 Other fatigue: Secondary | ICD-10-CM

## 2019-09-13 DIAGNOSIS — E039 Hypothyroidism, unspecified: Secondary | ICD-10-CM | POA: Insufficient documentation

## 2019-09-13 DIAGNOSIS — J849 Interstitial pulmonary disease, unspecified: Secondary | ICD-10-CM | POA: Diagnosis not present

## 2019-09-13 DIAGNOSIS — U071 COVID-19: Secondary | ICD-10-CM | POA: Diagnosis present

## 2019-09-13 DIAGNOSIS — E860 Dehydration: Secondary | ICD-10-CM

## 2019-09-13 MED ORDER — PREDNISONE 10 MG PO TABS: ORAL_TABLET | ORAL | 0 refills | Status: DC

## 2019-09-13 NOTE — Telephone Encounter (Signed)
Sent in prednisone taper. Glad CXR looked ok. You are getting monoclonal antibody infusion correct?

## 2019-09-13 NOTE — Addendum Note (Signed)
Addended by: Glenford Bayley on: 09/13/2019 10:35 AM   Modules accepted: Orders

## 2019-09-14 ENCOUNTER — Ambulatory Visit (HOSPITAL_COMMUNITY)
Admission: RE | Admit: 2019-09-14 | Discharge: 2019-09-14 | Disposition: A | Discharge status: Home / Self Care | Payer: 59 | Source: Ambulatory Visit | Attending: Pulmonary Disease | Admitting: Pulmonary Disease

## 2019-09-14 DIAGNOSIS — U071 COVID-19: Secondary | ICD-10-CM | POA: Diagnosis not present

## 2019-09-14 DIAGNOSIS — J849 Interstitial pulmonary disease, unspecified: Secondary | ICD-10-CM

## 2019-09-14 DIAGNOSIS — Z79899 Other long term (current) drug therapy: Secondary | ICD-10-CM

## 2019-09-14 MED ORDER — SODIUM CHLORIDE 0.9 % IV SOLN
700.0000 mg | Freq: Once | INTRAVENOUS | Status: AC
  Administered 2019-09-14: 700 mg via INTRAVENOUS
  Filled 2019-09-14: qty 20

## 2019-09-14 MED ORDER — SODIUM CHLORIDE 0.9 % IV SOLN
INTRAVENOUS | Status: DC | PRN
  Administered 2019-09-14: 250 mL via INTRAVENOUS

## 2019-09-14 MED ORDER — EPINEPHRINE 0.3 MG/0.3ML IJ SOAJ: 0.3000 mg | Freq: Once | INTRAMUSCULAR | Status: DC | PRN

## 2019-09-14 MED ORDER — FAMOTIDINE IN NACL 20-0.9 MG/50ML-% IV SOLN: 20.0000 mg | Freq: Once | INTRAVENOUS | Status: DC | PRN

## 2019-09-14 MED ORDER — METHYLPREDNISOLONE SODIUM SUCC 125 MG IJ SOLR: 125.0000 mg | Freq: Once | INTRAMUSCULAR | Status: DC | PRN

## 2019-09-14 MED ORDER — DIPHENHYDRAMINE HCL 50 MG/ML IJ SOLN: 50.0000 mg | Freq: Once | INTRAMUSCULAR | Status: DC | PRN

## 2019-09-14 MED ORDER — ALBUTEROL SULFATE HFA 108 (90 BASE) MCG/ACT IN AERS: 2.0000 | INHALATION_SPRAY | Freq: Once | RESPIRATORY_TRACT | Status: DC | PRN

## 2019-09-14 NOTE — Discharge Instructions (Signed)

## 2019-09-14 NOTE — Progress Notes (Signed)
  Diagnosis: COVID-19  Physician: Dr. Wright  Procedure: [facility] Med: bamlanivimab infusion - Provided patient with bamlanimivab fact sheet for patients, parents and caregivers prior to infusion.  Complications: No immediate complications noted.  Discharge: Discharged home   Behr Cislo A 09/14/2019  

## 2019-09-15 NOTE — Telephone Encounter (Signed)
TP please advise on pt email:   Infusion completed. Chills/ sweats and flu like symptoms all day/ night yesterday through this morning. No fever.  Pulse Ox readings vary from 95/117 to current at 96/114.  Drinking almost 50-60 oz water daily with Gatorade also. Started prednisone 40mg  today.  I'm concerned about my pulse Ox readings, my chest pains. Also headaches not as bad- but still there. Still very tired, disoriented and dizzy. Rapid breathing and shortness of breath.   Let me know please.  Thanks. .  [phone]

## 2019-09-15 NOTE — Telephone Encounter (Signed)
Called and spoke to patient says that she had chills body aches sweats flulike symptoms after infusion last night this morning seems to be improving as the day has gone on.  Still has some headache but is only mild now.  Still feels fatigued.  Oxygen levels have been 95 to 96% on room air.  Heart rate has been 1 14-1 17.  No palpitations.  Chart review shows heart rate 100-108 over the last couple months prior to her infusion Patient is eating and drinking.  Patient is also on steroids. Advised patient to continue with fluids.  Supportive care.  Rest Tylenol as needed Advised if symptoms worsen she is unable to take in fluids increased shortness of breath hypoxia , declining O2 saturations, high fevers, confusion she is to seek emergency care immediately. She denies any lip swelling rash trouble swallowing syncope.  Please contact office for sooner follow up if symptoms do not improve or worsen or seek emergency care

## 2019-09-16 ENCOUNTER — Other Ambulatory Visit (HOSPITAL_COMMUNITY): Payer: 59

## 2019-09-20 ENCOUNTER — Encounter: Payer: Self-pay | Admitting: Pulmonary Disease

## 2019-09-20 ENCOUNTER — Other Ambulatory Visit: Payer: Self-pay

## 2019-09-20 ENCOUNTER — Ambulatory Visit (INDEPENDENT_AMBULATORY_CARE_PROVIDER_SITE_OTHER): Payer: 59 | Admitting: Pulmonary Disease

## 2019-09-20 DIAGNOSIS — J849 Interstitial pulmonary disease, unspecified: Secondary | ICD-10-CM

## 2019-09-20 DIAGNOSIS — U071 COVID-19: Secondary | ICD-10-CM

## 2019-09-20 MED ORDER — PREDNISONE 10 MG PO TABS: ORAL_TABLET | ORAL | 1 refills | Status: DC

## 2019-09-20 MED ORDER — BUDESONIDE-FORMOTEROL FUMARATE 160-4.5 MCG/ACT IN AERO: 2.0000 | INHALATION_SPRAY | Freq: Two times a day (BID) | RESPIRATORY_TRACT | 6 refills | Status: DC

## 2019-09-20 NOTE — Patient Instructions (Signed)
Reduce prednisone to 10 mg/day for 2 weeks and then 5 mg/day for that Will make a follow-up visit in 4 weeks to reassess We will reschedule PFTs as they were canceled due to COVID-19 infection  We will call in refills for prednisone 10 mg tablets and Symbicort inhaler

## 2019-09-20 NOTE — Progress Notes (Signed)
Virtual Visit via Telephone Note  I connected with [name] on 09/20/19 at  9:00 AM EST by telephone and verified that I am speaking with the correct person using two identifiers.  Location: Patient: Home Provider: [facility], [address], [place], [place]   I discussed the limitations, risks, security and privacy concerns of performing an evaluation and management service by telephone and the availability of in person appointments. I also discussed with the patient that there may be a patient responsible charge related to this service. The patient expressed understanding and agreed to proceed.  History of Present Illness: Chief complaint: Follow-up for interstitial lung disease, hypersensitivity pneumonitis.  HPI: 44 year old with history of fibromyalgia, depression, anxiety Referred for evaluation of abnormal CT scan, ILD  Complains of increasing dyspnea with chest pressure since mid August 2020.  She had a CTA done by her primary care recently which did not show any embolism but showed bilateral diffuse pulmonary infiltrates.  COVID-19 test was negative.  Treated with multiple rounds of antibiotics without improvement and referred to pulmonary for further evaluation.  She has an echo with shunt study and HIV test pending at primary care.  Per the patient she has unspecified autoimmune disease and has been referred to Dr. Nickola Major, rheumatology in past. She had a work-up done there including labs which were reportedly negative.  She underwent bronchoscopy on 07/12/2019.  Started on 40 mg of prednisone on 11/19 for findings suggestive of hypersensitivity pneumonitis.  Case discussed at the disciplinary conference on 12/8 and diagnosis of hypersensitivity pneumonitis made with moderate confidence.  Exposure for down pillows and comforter which she got rid of  Interim history:  Seen by dermatologist for skin rash which may be secondary to prednisone.  She is also given  doxycycline with improvement in skin symptoms Diagnosed with COVID-19 on 1/13 and received monoclonal antibody therapy in 1/20.  Her acute symptoms have resolved but she still has cough with baseline dyspnea, fatigue, low energy  Continues on was increased to 40 mg during this.  And she is now on a tapering dose.   Observations/Objective: Hypersensitivity pneumonia, down exposure Continues on prolonged prednisone taper When she reaches 10 mg/day of prednisone continue for 2 more weeks and then 5 mg/day Reassess in 4 weeks PFTs canceled due to Covid infection.  We will try to reschedule these  Post COVID-19.  Received monoclonal antibody therapy. Overall improving.  Continue close monitoring  Assessment and Plan: Prednisone taper PFTs  Follow Up Instructions: Follow-up in 4 weeks   I discussed the assessment and treatment plan with the patient. The patient was provided an opportunity to ask questions and all were answered. The patient agreed with the plan and demonstrated an understanding of the instructions.   The patient was advised to call back or seek an in-person evaluation if the symptoms worsen or if the condition fails to improve as anticipated.  I provided 25 minutes of non-face-to-face time during this encounter.   Chilton Greathouse MD [facility] 09/20/2019, 9:03 AM

## 2019-09-20 NOTE — Addendum Note (Signed)
Addended by: Edwina Barth I on: 09/20/2019 09:43 AM   Modules accepted: Orders

## 2019-09-28 ENCOUNTER — Encounter (HOSPITAL_BASED_OUTPATIENT_CLINIC_OR_DEPARTMENT_OTHER): Payer: Self-pay | Admitting: *Deleted

## 2019-09-28 ENCOUNTER — Emergency Department (HOSPITAL_BASED_OUTPATIENT_CLINIC_OR_DEPARTMENT_OTHER): Payer: 59

## 2019-09-28 ENCOUNTER — Other Ambulatory Visit: Payer: Self-pay

## 2019-09-28 ENCOUNTER — Emergency Department (HOSPITAL_BASED_OUTPATIENT_CLINIC_OR_DEPARTMENT_OTHER)
Admission: EM | Admit: 2019-09-28 | Discharge: 2019-09-28 | Disposition: A | Discharge status: Home / Self Care | Payer: 59 | Attending: Emergency Medicine | Admitting: Emergency Medicine

## 2019-09-28 DIAGNOSIS — W07XXXA Fall from chair, initial encounter: Secondary | ICD-10-CM | POA: Insufficient documentation

## 2019-09-28 DIAGNOSIS — M797 Fibromyalgia: Secondary | ICD-10-CM | POA: Insufficient documentation

## 2019-09-28 DIAGNOSIS — Y999 Unspecified external cause status: Secondary | ICD-10-CM | POA: Insufficient documentation

## 2019-09-28 DIAGNOSIS — Y92008 Other place in unspecified non-institutional (private) residence as the place of occurrence of the external cause: Secondary | ICD-10-CM | POA: Diagnosis not present

## 2019-09-28 DIAGNOSIS — Y9389 Activity, other specified: Secondary | ICD-10-CM | POA: Diagnosis not present

## 2019-09-28 DIAGNOSIS — E039 Hypothyroidism, unspecified: Secondary | ICD-10-CM | POA: Diagnosis not present

## 2019-09-28 DIAGNOSIS — Z888 Allergy status to other drugs, medicaments and biological substances status: Secondary | ICD-10-CM | POA: Insufficient documentation

## 2019-09-28 DIAGNOSIS — S9032XA Contusion of left foot, initial encounter: Secondary | ICD-10-CM | POA: Diagnosis present

## 2019-09-28 DIAGNOSIS — Z79899 Other long term (current) drug therapy: Secondary | ICD-10-CM | POA: Insufficient documentation

## 2019-09-28 NOTE — ED Triage Notes (Signed)
C/o left foot injury x 2 hrs ago  °

## 2019-09-28 NOTE — Discharge Instructions (Signed)
It was my pleasure taking care of you today!   Ice affected area at least twice daily to help with pain or swelling.  Tylenol as needed for pain.  Rest! Stay off the foot for a few days.   Call the sports medicine doctor listed if symptoms are not improving in 1 week to schedule a follow up appointment.   Return to ER for new or worsening symptoms, any additional concerns.

## 2019-09-28 NOTE — ED Provider Notes (Signed)
[facility] EMERGENCY DEPARTMENT Provider Note   CSN: [id number] Arrival date & time: 09/28/19  1627     History Chief Complaint  Patient presents with  . Foot Injury    [name] is a 44 y.o. female.  The history is provided by the patient and medical records. No language interpreter was used.  Foot Injury  [name] is a 44 y.o. female  with a PMH as listed below who presents to the Emergency Department complaining of left foot pain after injury about 3 hours ago. Patient was trying to get something out of her closet while standing on a chair when she fell. She does not know if she hit her foot or turned it oddly. She is having pain to the foot associated with swelling and mild bruising. She put on a compression sock to help with the swelling. No open wounds. Pain worse with ambulation. No numbness or weakness.      Past Medical History:  Diagnosis Date  . Anxiety   . Arthritis   . Depression   . Fibromyalgia   . Hypothyroidism 1999   post PTU Rx for hyperthyroidism  . IBS (irritable bowel syndrome)     Patient Active Problem List   Diagnosis Date Noted  . SOB (shortness of breath) 09/12/2019  . Dehydration 09/12/2019  . COVID-19 virus infection 09/12/2019  . ILD (interstitial lung disease) (HCC)   . Edema 03/25/2019  . Skin lesions 03/25/2019  . 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 Poor concentration 10/15/2015  . Headache 06/20/2015  . PCO (polycystic ovaries) 03/02/2015  . Acne 01/31/2015  . Allergic rhinitis 01/05/2015  . High risk medications (not anticoagulants) long-term use 10/26/2013  . General medical examination 05/20/2011  . Foot pain, right 07/15/2010  . KNEE PAIN, LEFT 05/17/2010  . Depression with anxiety 04/19/2008  . B12 DEFICIENCY 03/17/2008  . Fatigue 02/14/2008  . Hypothyroidism 07/26/2007  . Fibromyalgia 07/26/2007  . IRRITABLE BOWEL SYNDROME, HX OF 07/26/2007    Past Surgical History:  Procedure  Laterality Date  . CESAREAN SECTION     x 2  . VIDEO BRONCHOSCOPY Bilateral 07/12/2019   Procedure: VIDEO BRONCHOSCOPY WITH FLUORO;  Surgeon: Chilton Greathouse, MD;  Location: [facility];  Service: Cardiopulmonary;  Laterality: Bilateral;     OB History   No obstetric history on file.     Family History  Problem Relation Age of Onset  . Diabetes Paternal Grandmother   . Hyperlipidemia Mother   . Diabetes Father   . Cancer Paternal Grandfather        lung  . ADD / ADHD Son   . Other Neg Hx        PCO    Social History   Tobacco Use  . Smoking status: Never Smoker  . Smokeless tobacco: Never Used  Substance Use Topics  . Alcohol use: Yes    Comment: daily  . Drug use: No    Home Medications Prior to Admission medications   Medication Sig Start Date End Date Taking? Authorizing Provider  acetaminophen (TYLENOL) 650 MG CR tablet Take 650 mg by mouth every 8 (eight) hours as needed for pain.    [provider]  albuterol (VENTOLIN HFA) 108 (90 Base) MCG/ACT inhaler Inhale 2 puffs into the lungs every 6 (six) hours as needed for wheezing or shortness of breath. 08/29/19   Glenford Bayley, NP  benzonatate (TESSALON) 200 MG capsule Take 1 capsule (200 mg total) by mouth  3 (three) times daily as needed for cough. 08/29/19   Martyn Ehrich, NP  budesonide-formoterol [facility]) 160-4.5 MCG/ACT inhaler Inhale 2 puffs into the lungs 2 (two) times daily. 09/20/19   Mannam, Hart Robinsons, MD  Cholecalciferol (VITAMIN D) 50 MCG (2000 UT) tablet Take 2,000 Units by mouth daily in the afternoon.    [provider]  DULoxetine (CYMBALTA) 60 MG capsule TAKE 1 CAPSULE BY MOUTH EVERY DAY Patient taking differently: Take 60 mg by mouth daily.  02/14/19   Midge Minium, MD  ipratropium-albuterol (DUONEB) 0.5-2.5 (3) MG/3ML SOLN Take 3 mLs by nebulization every 4 (four) hours as needed (shortness of breath).     [provider]  levothyroxine (SYNTHROID) 137 MCG  tablet TAKE 1 TABLET (137 MCG TOTAL) BY MOUTH DAILY BEFORE BREAKFAST. 08/15/19   Midge Minium, MD  predniSONE (DELTASONE) 10 MG tablet Take 4 tabs po daily x 3 days; then 3 tabs daily x3 days; then 2 tabs daily x3 days; then 1 tab daily x 3 days; then stop 09/13/19   Martyn Ehrich, NP  predniSONE (DELTASONE) 10 MG tablet Please take 10mg  daily for 2 weeks, then 5mg  daily 09/20/19   Mannam, Hart Robinsons, MD  pregabalin (LYRICA) 150 MG capsule Take 1 capsule (150 mg total) by mouth 2 (two) times daily. 03/10/19   Midge Minium, MD    Allergies    Methimazole and Bupropion  Review of Systems   Review of Systems  Musculoskeletal: Positive for arthralgias and myalgias.  Skin: Positive for color change. Negative for wound.  Neurological: Negative for weakness and numbness.    Physical Exam Updated Vital Signs BP (!) 153/99   Pulse (!) 119   Temp 98.7 F (37.1 C) (Oral)   Resp 18   Ht 5\' 5"  (1.651 m)   Wt 86.2 kg   LMP 09/09/2019   SpO2 100%   BMI 31.62 kg/m   Physical Exam Vitals and nursing note reviewed.  Constitutional:      General: She is not in acute distress.    Appearance: She is well-developed.  HENT:     Head: Normocephalic and atraumatic.  Cardiovascular:     Rate and Rhythm: Normal rate and regular rhythm.     Heart sounds: Normal heart sounds. No murmur.  Pulmonary:     Effort: Pulmonary effort is normal. No respiratory distress.     Breath sounds: Normal breath sounds. No wheezing or rales.  Musculoskeletal:     Cervical back: Neck supple.       Feet:     Comments: Tender to palpation as depicted in image. Mild overlying swelling and bruising. No open wounds. 2+ DP. Wiggles toes without difficulty. Sensation intact.   Skin:    General: Skin is warm and dry.  Neurological:     Mental Status: She is alert.     ED Results / Procedures / Treatments   Labs (all labs ordered are listed, but only abnormal results are displayed) Labs Reviewed - No  data to display  EKG None  Radiology DG Foot Complete Left  Result Date: 09/28/2019 CLINICAL DATA:  Injury EXAM: LEFT FOOT - COMPLETE 3+ VIEW COMPARISON:  None. FINDINGS: There is no evidence of fracture or dislocation. There is no evidence of arthropathy or other focal bone abnormality. Soft tissues are unremarkable. IMPRESSION: Negative. Electronically Signed   By: Prudencio Pair M.D.   On: 09/28/2019 16:44    Procedures Procedures (including critical care time)  Medications Ordered in  ED Medications - No data to display  ED Course  I have reviewed the triage vital signs and the nursing notes.  Pertinent labs & imaging results that were available during my care of the patient were reviewed by me and considered in my medical decision making (see chart for details).    MDM Rules/Calculators/A&P                       [name] is a 44 y.o. female who presents to ED for left foot pain after fall just prior to arrival. NVI. X-ray without acute findings. Evaluation does not show pathology that would require ongoing emergent intervention or inpatient treatment. Symptomatic home care instructions discussed. Sports med follow up if no improvement. Reasons to return to ER discussed and all questions answered.   Final Clinical Impression(s) / ED Diagnoses Final diagnoses:  Contusion of left foot, initial encounter    Rx / DC Orders ED Discharge Orders    None       Erique Kaser, Chase Picket, PA-C 09/28/19 1734    Milagros Loll, MD 09/30/19 [phone]

## 2019-10-10 ENCOUNTER — Telehealth (INDEPENDENT_AMBULATORY_CARE_PROVIDER_SITE_OTHER): Payer: 59 | Admitting: Pulmonary Disease

## 2019-10-10 ENCOUNTER — Other Ambulatory Visit: Payer: Self-pay | Admitting: Pulmonary Disease

## 2019-10-10 ENCOUNTER — Encounter: Payer: Self-pay | Admitting: Pulmonary Disease

## 2019-10-10 DIAGNOSIS — J849 Interstitial pulmonary disease, unspecified: Secondary | ICD-10-CM

## 2019-10-10 DIAGNOSIS — U071 COVID-19: Secondary | ICD-10-CM

## 2019-10-10 NOTE — Patient Instructions (Addendum)
You were seen today by Coral Ceo, NP  for:     1. ILD (interstitial lung disease) (HCC)  We will work to get you rescheduled for your pulmonary function test  For right now take 10 mg of prednisone daily until we physically see you in office to further evaluate  2. COVID-19 virus infection  I believe some of the symptoms that you are dealing with her symptoms of long Covid  Your upcoming pulmonary function test will be able to help [place] further evaluate your breathing  Continue to monitor your oxygen levels and ensure they remain above 92% on room air  Notify our office if symptoms worsen     I do want you to schedule an in person office visit with primary care to further investigate your generalized numbness.  If you start to have unilateral weakness please present to an emergency room.  If symptoms are persistently worsening please present to an emergency room to further evaluate.  As discussed in our video visit today.   Follow Up:    Return in about 4 weeks (around 11/07/2019), or if symptoms worsen or fail to improve, for Follow up with [name], With walk in office.   Please do your part to reduce the spread of COVID-19:      Reduce your risk of any infection  and COVID19 by using the similar precautions used for avoiding the common cold or flu:  Marland Kitchen Wash your hands often with soap and warm water for at least 20 seconds.  If soap and water are not readily available, use an alcohol-based hand sanitizer with at least 60% alcohol.  . If coughing or sneezing, cover your mouth and nose by coughing or sneezing into the elbow areas of your shirt or coat, into a tissue or into your sleeve (not your hands). Drinda Butts A MASK when in public  . Avoid shaking hands with others and consider head nods or verbal greetings only. . Avoid touching your eyes, nose, or mouth with unwashed hands.  . Avoid close contact with people who are sick. . Avoid places or events with large numbers  of people in one location, like concerts or sporting events. . If you have some symptoms but not all symptoms, continue to monitor at home and seek medical attention if your symptoms worsen. . If you are having a medical emergency, call 911.   ADDITIONAL HEALTHCARE OPTIONS FOR PATIENTS  [place] [facility] / e-Visit: [email or web address]         [facility]: [phone]  [facility]: [phone]                   [facility]: [phone]     It is flu season:   >>> Best ways to protect herself from the flu: Receive the yearly flu vaccine, practice good hand hygiene washing with soap and also using hand sanitizer when available, eat a nutritious meals, get adequate rest, hydrate appropriately   Please contact the office if your symptoms worsen or you have concerns that you are not improving.   Thank you for choosing [facility] for your healthcare, and for allowing [place] to partner with you on your healthcare journey. I am thankful to be able to provide care to you today.   Elisha Headland FNP-C

## 2019-10-10 NOTE — Assessment & Plan Note (Signed)
Bronchoscopy favoring hypersensitivity pneumonitis panel Patient currently tapering down from 40 mg of prednisone Unfortunately tested positive for Covid in January/2021  Discussion: Believe majority of patient symptoms that she is dealing with her symptoms of long Covid.  She does need an in office physical exam to further evaluate her symptoms.  Also we need to obtain pulmonary function testing to further evaluate her dyspnea and fatigue.  Given the fact that some of her symptoms seem to be responsive to prednisone I will have her maintain on 10 mg of prednisone at this time until she can have an in person physical exam.  Plan: Continue prednisone 10 mg daily 2 to 4-week follow-up with our office Reschedule pulmonary function test

## 2019-10-10 NOTE — Assessment & Plan Note (Addendum)
Tested positive for SARS-CoV-2 on 09/07/2019, no longer infectious Received monoclonal antibody infusion Continues to have ongoing symptoms of fatigue, cough, weakness  Discussion: Patient continues to have symptoms of long Covid.  Vital signs are stable today.  I recommended that she be scheduled for a follow-up with our office for physical exam as well as that she follows up with primary care for an in person physical exam.  Plan: We will reschedule pulmonary function testing Patient needs 2 to 4-week follow-up with [name] in office with walk

## 2019-10-10 NOTE — Progress Notes (Signed)
Virtual Visit via Video Note  I connected with [name] on 10/10/19 at  2:00 PM EST by a video enabled telemedicine application and verified that I am speaking with the correct person using two identifiers.  Location: Patient: Home Provider: Office - [facility] - [address] [place], [address], [place], [place] [address]  I discussed the limitations of evaluation and management by telemedicine and the availability of in person appointments. The patient expressed understanding and agreed to proceed. I also discussed with the patient that there may be a patient responsible charge related to this service. The patient expressed understanding and agreed to proceed.  Patient consented to consult via telephone: Yes People present and their role in pt care: Pt   History of Present Illness:  44 year old female never smoker followed in our office for interstitial lung disease -hypersensitivity pneumonitis  Past medical history: Allergic rhinitis: Hypothyroidism, fibromyalgia, IBS, history of COVID-19 (received monoclonal antibody infusion, January/2021) Smoking history: Never smoker Maintenance: Prednisone 40 mg Patient of [name]  Chief complaint: Shortness of breath, chest pain for 52 days  44 year old female never smoker followed in our office for interstitial lung disease/believed to be hypersensitivity pneumonitis.  She last completed follow-up with our office with a virtual visit in January/2021.  At this time [name] was working to decrease patient's chronic prednisone.  Her pulmonary function tests were canceled and needed to be rescheduled due to her testing positive for COVID-19 in January/2021.  She did receive the monoclonal antibody infusion.  Continues to struggle with ongoing symptoms of cough, dyspnea, fatigue and low energy.  Patient contacted our office on 10/10/2019 reporting that she does not feel she is getting better.  She is having intermittent chest pains.  Getting  tingly all over her body.  She is very weak.  No fever.  Nonproductive cough.  Usually mornings only.  Very hard to talk at length.  Patient completing video visit with our office today.  Patient is under no visible distress during video visit at this point time.  She is reporting that she feels that her exhaustion continues to and may have even progressively worsened.  She continues to have labored breathing especially after speaking.  An ongoing dry cough.  She noticed 3 to 4 days ago that she was having intermittent chest pain again.  This was a symptom that she has been dealing with since October/2020.  Initially resolved with the higher doses of steroids.  She has been working on titrating down on her steroids.  When she was on 10 mg her symptoms felt better manage.  She did miss a dose with her 10 mg and she felt extremely fatigued and had worsening symptoms.  She transition down to 5 mg of prednisone today.  She is unsure if part of her symptoms are directly related to this.  She is very concerned about chronic management especially when she comes off of steroids.  Oxygen levels are stable at 97 to 100% on room air.  Heart rate is stable at 97.  Last in office note in December/2020 shows patient's baseline heart rate at 100.  No sudden increased shortness of breath.  No unilateral leg swelling.  Observations/Objective:  No acute distress on video chat    09/07/2019 - SARSCOV2 - positive   10/10/19 - SPO2 - 97 - 100 10/10/19 - HR - 97  06/30/2020-CT chest high-res-pattern of mild irregular peripheral interstitial opacity, fine nodularity and groundglass without a clear apical to basilar gradient there  is no significant bronchiectasis or bronchiolectasis.  Findings are consistent with mild pulmonary fibrosis.  An alternative diagnosis pattern of ATS criteria.  Primary differential consideration of interstitial lung diseases fibrosing NSIP.  Labs: ANA 03/25/2019-negative,  Rheumatoid factor  03/25/2019- < 14 Repeat CTD serologies 07/18/2019-negative, hypersensitivity panel-negative  CBC 06/15/2019-WBC 5.87, eos 6.4%, absolute eosinophil count 314 Metabolic panel [id number] normal limits D-dimer 06/30/2019-0.41 BNP 06/30/2019-10.   Bronchoscopy 07/12/2019 WBC 510, lymphocytes 38%, eos 12%, Microbiology-negative Cytology -no malignancy Transbronchial biopsy-nonnecrotizing epithelioid granulomas  Social History   Tobacco Use  Smoking Status Never Smoker  Smokeless Tobacco Never Used   Immunization History  Administered Date(s) Administered  . Influenza Split 05/19/2012  . Influenza Whole 06/19/2008  . Influenza,inj,Quad PF,6+ Mos 04/30/2015, 04/12/2016, 06/23/2018, 05/25/2019  . Influenza-Unspecified 04/27/2015, 04/30/2015, 04/25/2016  . Td 07/13/2009    Assessment and Plan:  COVID-19 virus infection Tested positive for SARS-CoV-2 on 09/07/2019, no longer infectious Received monoclonal antibody infusion Continues to have ongoing symptoms of fatigue, cough, weakness  Discussion: Patient continues to have symptoms of long Covid.  Vital signs are stable today.  I recommended that she be scheduled for a follow-up with our office for physical exam as well as that she follows up with primary care for an in person physical exam.  Plan: We will reschedule pulmonary function testing Patient needs 2 to 4-week follow-up with Dr. Vaughan Browner in office with walk   ILD (interstitial lung disease) ([place]) Bronchoscopy favoring hypersensitivity pneumonitis panel Patient currently tapering down from 40 mg of prednisone Unfortunately tested positive for Covid in January/2021  Discussion: Believe majority of patient symptoms that she is dealing with her symptoms of long Covid.  She does need an in office physical exam to further evaluate her symptoms.  Also we need to obtain pulmonary function testing to further evaluate her dyspnea and fatigue.  Given the fact that some of her  symptoms seem to be responsive to prednisone I will have her maintain on 10 mg of prednisone at this time until she can have an in person physical exam.  Plan: Continue prednisone 10 mg daily 2 to 4-week follow-up with our office Reschedule pulmonary function test   Follow Up Instructions:  Return in about 4 weeks (around 11/07/2019), or if symptoms worsen or fail to improve, for Follow up with Dr. Vaughan Browner, With walk in office.   We will also work to get you back on the schedule for pulmonary function testing.    I discussed the assessment and treatment plan with the patient. The patient was provided an opportunity to ask questions and all were answered. The patient agreed with the plan and demonstrated an understanding of the instructions.   The patient was advised to call back or seek an in-person evaluation if the symptoms worsen or if the condition fails to improve as anticipated.  I provided 32 minutes of non-face-to-face time during this encounter.   Lauraine Rinne, NP

## 2019-10-10 NOTE — Telephone Encounter (Signed)
Called and spoke with pt. Pt stated she has had increased SOB and chest pain for about 4 days now. Pt described the pain in her chest similar to when she had pna in the past. Pt notified her PCP about her symptoms and they stated to her that she should go to the ER but pt stated that she did not want to go there unless if she had to. Pt said her breathing is worse when she is trying to talk.  I have scheduled pt for a video visit at 2pm with Arlys John. Nothing further needed.

## 2019-10-17 NOTE — Progress Notes (Signed)
Referring-Crystal Skakle DO Reason for referral-chest pain and dyspnea  HPI: 44 year old female for evaluation of chest pain and dyspnea at request of Charlane Ferretti DO.  She is followed by pulmonary for hypersensitivity pneumonitis.  Had Covid January 2021.  Echocardiogram November 2020 showed normal LV function, mild left atrial enlargement.  CTA October 2020 showed no pulmonary embolus. High-resolution chest CT November 2020 showed interstitial lung disease.  Since August the patient has had dyspnea on exertion and some dyspnea at rest as well.  No orthopnea, PND or pedal edema.  No syncope.  She also describes intermittent chest pain.  It can be in the mid chest area, right breast or left breast area.  No radiation.  Increases with cough and some improvement with inhalers.  Typically last 5 minutes.  No associated nausea or diaphoresis.  She does not clearly have exertional chest pain.  Cardiology now asked to evaluate.  Current Outpatient Medications  Medication Sig Dispense Refill  . acetaminophen (TYLENOL) 650 MG CR tablet Take 650 mg by mouth every 8 (eight) hours as needed for pain.    Marland Kitchen albuterol (VENTOLIN HFA) 108 (90 Base) MCG/ACT inhaler Inhale 2 puffs into the lungs every 6 (six) hours as needed for wheezing or shortness of breath. 8 g 2  . benzonatate (TESSALON) 200 MG capsule Take 1 capsule (200 mg total) by mouth 3 (three) times daily as needed for cough. 30 capsule 1  . budesonide-formoterol (SYMBICORT) 160-4.5 MCG/ACT inhaler Inhale 2 puffs into the lungs 2 (two) times daily. 1 Inhaler 6  . Cholecalciferol (VITAMIN D) 50 MCG (2000 UT) tablet Take 2,000 Units by mouth daily in the afternoon.    . DULoxetine (CYMBALTA) 60 MG capsule TAKE 1 CAPSULE BY MOUTH EVERY DAY (Patient taking differently: Take 60 mg by mouth daily. ) 90 capsule 2  . ipratropium-albuterol (DUONEB) 0.5-2.5 (3) MG/3ML SOLN Take 3 mLs by nebulization every 4 (four) hours as needed (shortness of breath).     Marland Kitchen  levothyroxine (SYNTHROID) 137 MCG tablet TAKE 1 TABLET (137 MCG TOTAL) BY MOUTH DAILY BEFORE BREAKFAST. 90 tablet 1  . predniSONE (DELTASONE) 10 MG tablet Take 4 tabs po daily x 3 days; then 3 tabs daily x3 days; then 2 tabs daily x3 days; then 1 tab daily x 3 days; then stop 30 tablet 0  . predniSONE (DELTASONE) 10 MG tablet Please take 10mg  daily for 2 weeks, then 5mg  daily 30 tablet 1  . predniSONE (DELTASONE) 10 MG tablet TAKE 1 TABLET (10MG ) BY MOUTH DAILY FOR 2 WEEKS, THEN DECREASE TO 0.5 TABLET (5MG ) DAILY 42 tablet 0  . pregabalin (LYRICA) 150 MG capsule Take 1 capsule (150 mg total) by mouth 2 (two) times daily. 60 capsule 3   No current facility-administered medications for this visit.    Allergies  Allergen Reactions  . Methimazole Other (See Comments)    Body goes into arthritic shock  . Bupropion     Body aches / headaches      Past Medical History:  Diagnosis Date  . Anxiety   . Arthritis   . Depression   . Fibromyalgia   . Hypothyroidism 1999   post PTU Rx for hyperthyroidism  . IBS (irritable bowel syndrome)   . Interstitial lung disease [facility])     Past Surgical History:  Procedure Laterality Date  . CESAREAN SECTION     x 2  . VIDEO BRONCHOSCOPY Bilateral 07/12/2019   Procedure: VIDEO BRONCHOSCOPY WITH FLUORO;  Surgeon: ,  MD;  Location: [facility];  Service: Cardiopulmonary;  Laterality: Bilateral;    Social History   Socioeconomic History  . Marital status: Married    Spouse name: Not on file  . Number of children: 2  . Years of education: Not on file  . Highest education level: Not on file  Occupational History  . Occupation: Landscape architect: [employer]  Tobacco Use  . Smoking status: Never Smoker  . Smokeless tobacco: Never Used  Substance and Sexual Activity  . Alcohol use: Yes    Comment: occasional  . Drug use: No  . Sexual activity: Not on file  Other Topics Concern  . Not on file  Social History Narrative    . Not on file   Social Determinants of Health   Financial Resource Strain:   . Difficulty of Paying Living Expenses: Not on file  Food Insecurity:   . Worried About Programme researcher, broadcasting/film/video in the Last Year: Not on file  . Ran Out of Food in the Last Year: Not on file  Transportation Needs:   . Lack of Transportation (Medical): Not on file  . Lack of Transportation (Non-Medical): Not on file  Physical Activity:   . Days of Exercise per Week: Not on file  . Minutes of Exercise per Session: Not on file  Stress:   . Feeling of Stress : Not on file  Social Connections:   . Frequency of Communication with Friends and Family: Not on file  . Frequency of Social Gatherings with Friends and Family: Not on file  . Attends Religious Services: Not on file  . Active Member of Clubs or Organizations: Not on file  . Attends Banker Meetings: Not on file  . Marital Status: Not on file  Intimate Partner Violence:   . Fear of Current or Ex-Partner: Not on file  . Emotionally Abused: Not on file  . Physically Abused: Not on file  . Sexually Abused: Not on file    Family History  Problem Relation Age of Onset  . Diabetes Paternal Grandmother   . Hyperlipidemia Mother   . Diabetes Father   . Cancer Paternal Grandfather        lung  . ADD / ADHD Son   . Other Neg Hx        PCO    ROS: no fevers or chills, productive cough, hemoptysis, dysphasia, odynophagia, melena, hematochezia, dysuria, hematuria, rash, seizure activity, orthopnea, PND, pedal edema, claudication. Remaining systems are negative.  Physical Exam:   Blood pressure 124/76, pulse (!) 107, height 5\' 5"  (1.651 m), weight 197 lb 12.8 oz (89.7 kg).  General:  Well developed/well nourished in NAD Skin warm/dry Patient not depressed No peripheral clubbing Back-normal HEENT-normal/normal eyelids Neck supple/normal carotid upstroke bilaterally; no bruits; no JVD; no thyromegaly chest - CTA/ normal expansion CV -  RRR/normal S1 and S2; no murmurs, rubs or gallops;  PMI nondisplaced Abdomen -NT/ND, no HSM, no mass, + bowel sounds, no bruit 2+ femoral pulses, no bruits Ext-no edema, chords, 2+ DP Neuro-grossly nonfocal  ECG -sinus tachycardia at a rate of 107, nonspecific ST changes.  Personally reviewed  A/P  1 chest pain-symptoms are extremely atypical.  Electrocardiogram shows no ST changes.  Her symptoms worsen with cough and also improved with bronchodilators and nebulizers.  Likely pulmonary in etiology.  We discussed further evaluation including cardiac CT today.  However she would prefer to be conservative at this point.  She will contact  us if her symptoms worsen or she would like further evaluation in the future.  2 dyspnea-this is felt secondary to interstitial lung disease.  She is not volume overloaded on examination and previous echocardiogram showed normal LV function.  Kirk Ruths, MD

## 2019-10-18 ENCOUNTER — Other Ambulatory Visit: Payer: Self-pay

## 2019-10-18 ENCOUNTER — Encounter: Payer: Self-pay | Admitting: Cardiology

## 2019-10-18 ENCOUNTER — Ambulatory Visit: Payer: 59 | Admitting: Cardiology

## 2019-10-18 VITALS — BP 124/76 | HR 107 | Ht 65.0 in | Wt 197.8 lb

## 2019-10-18 DIAGNOSIS — R072 Precordial pain: Secondary | ICD-10-CM | POA: Diagnosis not present

## 2019-10-18 DIAGNOSIS — R0602 Shortness of breath: Secondary | ICD-10-CM | POA: Diagnosis not present

## 2019-10-18 NOTE — Patient Instructions (Signed)
Medication Instructions:  NO CHANGE *If you need a refill on your cardiac medications before your next appointment, please call your pharmacy*  Lab Work: If you have labs (blood work) drawn today and your tests are completely normal, you will receive your results only by: . MyChart Message (if you have MyChart) OR . A paper copy in the mail If you have any lab test that is abnormal or we need to change your treatment, we will call you to review the results.  Follow-Up: At [facility], you and your health needs are our priority.  As part of our continuing mission to provide you with exceptional heart care, we have created designated Provider Care Teams.  These Care Teams include your primary Cardiologist (physician) and Advanced Practice Providers (APPs -  Physician Assistants and Nurse Practitioners) who all work together to provide you with the care you need, when you need it.  Your next appointment:   AS NEEDED 

## 2019-10-26 ENCOUNTER — Encounter: Payer: Self-pay | Admitting: Pulmonary Disease

## 2019-10-26 ENCOUNTER — Other Ambulatory Visit: Payer: Self-pay

## 2019-10-26 ENCOUNTER — Ambulatory Visit: Payer: 59 | Admitting: Pulmonary Disease

## 2019-10-26 DIAGNOSIS — J849 Interstitial pulmonary disease, unspecified: Secondary | ICD-10-CM | POA: Diagnosis not present

## 2019-10-26 LAB — COMPREHENSIVE METABOLIC PANEL
ALT: 15 U/L (ref 0–35)
AST: 20 U/L (ref 0–37)
Albumin: 3.7 g/dL (ref 3.5–5.2)
Alkaline Phosphatase: 80 U/L (ref 39–117)
BUN: 15 mg/dL (ref 6–23)
CO2: 29 mEq/L (ref 19–32)
Calcium: 9.2 mg/dL (ref 8.4–10.5)
Chloride: 101 mEq/L (ref 96–112)
Creatinine, Ser: 0.75 mg/dL (ref 0.40–1.20)
GFR: 83.77 mL/min (ref >60.00)
Glucose, Bld: 191 mg/dL — ABNORMAL HIGH (ref 70–99)
Potassium: 3.7 mEq/L (ref 3.5–5.1)
Sodium: 137 mEq/L (ref 135–145)
Total Bilirubin: 0.4 mg/dL (ref 0.2–1.2)
Total Protein: 6.7 g/dL (ref 6.0–8.3)

## 2019-10-26 LAB — CBC WITH DIFFERENTIAL/PLATELET
Basophils Absolute: 0 10*3/uL (ref 0.0–0.1)
Basophils Relative: 0.4 % (ref 0.0–3.0)
Eosinophils Absolute: 0.1 10*3/uL (ref 0.0–0.7)
Eosinophils Relative: 1.5 % (ref 0.0–5.0)
HCT: 37.7 % (ref 36.0–46.0)
Hemoglobin: 12.4 g/dL (ref 12.0–15.0)
Lymphocytes Relative: 21.2 % (ref 12.0–46.0)
Lymphs Abs: 1.5 10*3/uL (ref 0.7–4.0)
MCHC: 32.9 g/dL (ref 30.0–36.0)
MCV: 87.9 fl (ref 78.0–100.0)
Monocytes Absolute: 0.7 10*3/uL (ref 0.1–1.0)
Monocytes Relative: 10 % (ref 3.0–12.0)
Neutro Abs: 4.6 10*3/uL (ref 1.4–7.7)
Neutrophils Relative %: 66.9 % (ref 43.0–77.0)
Platelets: 281 10*3/uL (ref 150.0–400.0)
RBC: 4.29 Mil/uL (ref 3.87–5.11)
RDW: 16.2 % — ABNORMAL HIGH (ref 11.5–15.5)
WBC: 6.9 10*3/uL (ref 4.0–10.5)

## 2019-10-26 NOTE — Patient Instructions (Signed)
We will schedule you for high-resolution CT Check CBC with differential, IgE, CMP today Reduce prednisone to 5 mg a day Follow-up in 4 weeks after PFTs

## 2019-10-26 NOTE — Progress Notes (Addendum)
[name]    [id number]    31-May-1976  Primary Care Physician:Skakle, Liane Comber, DO  Referring Physician: Sueanne Margarita, DO [place],  [place] [id number]  Chief complaint: Follow-up for interstitial lung disease, hypersensitivity pneumonitis.  HPI: 44 year old with history of fibromyalgia, depression, anxiety Referred for evaluation of abnormal CT scan  Complains of increasing dyspnea with chest pressure since mid August 2020.  She had a CTA done by her primary care recently which did not show any embolism but showed bilateral diffuse pulmonary infiltrates.  COVID-19 test was negative.  She has been treated with doxycycline and azithromycin with no improvement in symptoms Follow-up chest x-ray at her primary care yesterday apparently showed persistent interstitial abnormalities and she was given Symbicort, nebs and started on levofloxacin.  Referred to pulmonary for further evaluation.  She has an echo with shunt study and HIV test pending at primary care.  Per the patient she has unspecified autoimmune disease and has been referred to Dr. Trudie Reed, rheumatology.  She had a work-up done including labs which were reportedly negative.  She underwent bronchoscopy on 07/12/2019 with findings of lymphocytosis on BAL and granulomas. Discussed at multidisciplinary conference on 08/02/2019 with diagnosis of hypersensitivity pneumonitis and started on prednisone at 40 mg  Pets: Has dogs.  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  She has a down comforter for many years and got a down pillow in 2019. She got rid of the down after bronchoscopy in late 2020 Smoking history: No significant smoking history Travel history: Previously lived in [place], [place].  Has been living in [place] for the past 18 years.  No significant recent travel Relevant family history: Grandfather  died of lung cancer.  No other significant family history of lung disease.  Interim history:  Continues on prednisone with a slow taper.  Currently at 10 mg. Diagnosed with COVID-19 on 1/13 and received monoclonal antibody therapy in 1/20.  Her acute symptoms have resolved but she still has cough with baseline dyspnea, fatigue, low energy  Has seen cardiology for atypical chest pain which was felt to be secondary to lung issues.  Outpatient Encounter Medications as of 10/26/2019  Medication Sig  . acetaminophen (TYLENOL) 650 MG CR tablet Take 650 mg by mouth every 8 (eight) hours as needed for pain.  Marland Kitchen albuterol (VENTOLIN HFA) 108 (90 Base) MCG/ACT inhaler Inhale 2 puffs into the lungs every 6 (six) hours as needed for wheezing or shortness of breath.  . benzonatate (TESSALON) 200 MG capsule Take 1 capsule (200 mg total) by mouth 3 (three) times daily as needed for cough.  . budesonide-formoterol (SYMBICORT) 160-4.5 MCG/ACT inhaler Inhale 2 puffs into the lungs 2 (two) times daily.  . Cholecalciferol (VITAMIN D) 50 MCG (2000 UT) tablet Take 2,000 Units by mouth daily in the afternoon.  . DULoxetine (CYMBALTA) 60 MG capsule TAKE 1 CAPSULE BY MOUTH EVERY DAY (Patient taking differently: Take 60 mg by mouth daily. )  . ipratropium-albuterol (DUONEB) 0.5-2.5 (3) MG/3ML SOLN Take 3 mLs by nebulization every 4 (four) hours as needed (shortness of breath).   Marland Kitchen levothyroxine (SYNTHROID) 137 MCG tablet TAKE 1 TABLET (137 MCG TOTAL) BY MOUTH DAILY BEFORE BREAKFAST.  Marland Kitchen predniSONE (DELTASONE) 10 MG tablet TAKE 1 TABLET (10MG) BY MOUTH DAILY FOR 2 WEEKS, THEN DECREASE TO 0.5 TABLET (5MG) DAILY  . pregabalin (LYRICA) 150  MG capsule Take 1 capsule (150 mg total) by mouth 2 (two) times daily.  . [DISCONTINUED] predniSONE (DELTASONE) 10 MG tablet Take 4 tabs po daily x 3 days; then 3 tabs daily x3 days; then 2 tabs daily x3 days; then 1 tab daily x 3 days; then stop  . [DISCONTINUED] predniSONE (DELTASONE) 10 MG  tablet Please take 20m daily for 2 weeks, then 574mdaily   No facility-administered encounter medications on file as of 10/26/2019.   Physical Exam: Blood pressure 124/82, pulse 89, temperature 97.7 F (36.5 C), temperature source Temporal, height _0  (1.651 m), weight 195 lb 6.4 oz (88.6 kg), SpO2 98 %. Gen:      No acute distress HEENT:  EOMI, sclera anicteric Neck:     No masses; no thyromegaly Lungs:    Clear to auscultation bilaterally; normal respiratory effort CV:         Regular rate and rhythm; no murmurs Abd:      + bowel sounds; soft, non-tender; no palpable masses, no distension Ext:    No edema; adequate peripheral perfusion Skin:      Warm and dry; no rash Neuro: alert and oriented x 3 Psych: normal mood and affect  Data Reviewed: Imaging: CTA 06/15/2019-no pulmonary embolism, mild diffuse interstitial prominence with bilateral groundglass and nodular airspace opacities.   High-res CT 07/11/2019-Bilateral fine nodularity with groundglass with no significant air trapping.  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  Labs: ANA 03/25/2019-negative,  Rheumatoid factor 03/25/2019- < 14 Repeat CTD serologies 07/18/2019-negative, hypersensitivity panel-negative  CBC 06/15/2019-WBC 5.87, eos 6.4%, absolute eosinophil count 37048etabolic panel 1088/[id number] limits D-dimer 06/30/2019-0.41 BNP 06/30/2019-10.   Bronchoscopy 07/12/2019 WBC 510, lymphocytes 38%, eos 12%, Microbiology-negative Cytology -no malignancy Transbronchial biopsy-nonnecrotizing epithelioid granulomas SARS-CoV-2, viral PCR-negative  Assessment:  Interstitial lung disease, hypersensitivity pneumonitis Post COVID-19 CT scan reviewed with bilateral interstitial abnormalities, nodular airspace opacities which have persisted after multiple rounds of antibiotics. Reviewed bronchoscopy results and discussed at ILD conference dec 2020.  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  Suspect that she may also have reactive airways disease/asthma Has elevated peripheral eosinophilia and IgE the past  Based on review of CT and PFTs we may consider increasing the prednisone dose or initiation of biologic therapy for asthma  This appointment required 40 minutes of patient care (this includes precharting, chart review, review of results, face-to-face care, etc.).  Plan/Recommendations: - Prednisone 5 mg a day - Continue Symbicort inhaler, nebulizer - Pulmonary function test, high res CT - Repeat CBC, IgE  PrMarshell GarfinkelD [facility] 10/26/2019, 1:37 PM  CC: SkSueanne MargaritaDO

## 2019-10-26 NOTE — Addendum Note (Signed)
Addended by: Edwina Barth I on: 10/26/2019 03:31 PM   Modules accepted: Orders

## 2019-10-27 LAB — IGE: IgE (Immunoglobulin E), Serum: 66 kU/L (ref <=114)

## 2019-10-28 DIAGNOSIS — R7309 Other abnormal glucose: Secondary | ICD-10-CM

## 2019-10-28 NOTE — Telephone Encounter (Signed)
[name], please see pt's mychart message and advise on it for her. Thanks!

## 2019-10-29 NOTE — Telephone Encounter (Signed)
Yes. The glucose on last lab was higher then normal. We will order the HbA1c test to be done.

## 2019-10-31 ENCOUNTER — Telehealth: Payer: Self-pay | Admitting: Pulmonary Disease

## 2019-10-31 NOTE — Telephone Encounter (Signed)
Tech called back and states she is still unable to see lab. Call was attempted but could not reach anyone. WCB.  May need to fax the requisition to [facility].

## 2019-10-31 NOTE — Addendum Note (Signed)
Addended by: Sheran Luz on: 10/31/2019 04:45 PM   Modules accepted: Orders

## 2019-10-31 NOTE — Telephone Encounter (Signed)
Spoke with [facility] tech, pt is currently at [facility] getting her a1c drawn and was advised the tech cannot see the orders and wanted to be sure the resulting agency was [facility]. It was not [facility] but [facility]. I have cancelled the order and placed and new order to result to [facility].

## 2019-10-31 NOTE — Telephone Encounter (Signed)
I attempted to call [facility] twice and did not get an answer. It just rang.

## 2019-11-01 ENCOUNTER — Other Ambulatory Visit: Payer: Self-pay | Admitting: Pulmonary Disease

## 2019-11-01 NOTE — Telephone Encounter (Signed)
[name] from [facility] needs order for A1C lab order.  Phone number is [phone].  Fax number is [phone].

## 2019-11-01 NOTE — Telephone Encounter (Signed)
I have printed the lab order and faxed it to [facility]. I will touch base with them to make sure that they have everything that is needed to run the test.

## 2019-11-02 ENCOUNTER — Ambulatory Visit (HOSPITAL_BASED_OUTPATIENT_CLINIC_OR_DEPARTMENT_OTHER)
Admission: RE | Admit: 2019-11-02 | Discharge: 2019-11-02 | Disposition: A | Discharge status: Home / Self Care | Payer: 59 | Source: Ambulatory Visit | Attending: Pulmonary Disease | Admitting: Pulmonary Disease

## 2019-11-02 ENCOUNTER — Other Ambulatory Visit: Payer: Self-pay

## 2019-11-02 DIAGNOSIS — J849 Interstitial pulmonary disease, unspecified: Secondary | ICD-10-CM

## 2019-11-02 LAB — HGB A1C W/O EAG: Hgb A1c MFr Bld: 7.7 % — ABNORMAL HIGH (ref 4.8–5.6)

## 2019-11-03 ENCOUNTER — Telehealth: Payer: Self-pay | Admitting: Pulmonary Disease

## 2019-11-03 NOTE — Telephone Encounter (Signed)
Called and discussed CT scan results with patient.  High-res CT 3/10 shows increase in subpleural reticulation with groundglass, traction bronchiectasis.  In indeterminate for UIP fibrosis.  Patient was previously being treated for hypersensitivity pneumonitis (down exposure) with a steroid taper Suspect that she has an increase in ILD due to recent COVID-19 pneumonia. Difficult to exclude underlying interstitial process such as NSIP fibrosis unless we get surgical lung biopsy. She has PFTs and follow-up at [facility] pulmonary scheduled for next week.  We will reevaluate case after she gets a second opinion.  In the meantime increase prednisone to 40 mg a day. We will also call in Bactrim double strength 3 times a day for pneumocystis prophylaxis She will need to follow-up with primary care as she has elevated sugars on the prednisone with increase in hemoglobin A1c to 7.7 She will need to be started on hypoglycemic agent while on prednisone  Chilton Greathouse MD [facility] Please see [email or web address] for pager details.  11/03/2019, 2:04 PM

## 2019-11-04 LAB — PULMONARY FUNCTION TEST

## 2019-11-04 MED ORDER — SULFAMETHOXAZOLE-TRIMETHOPRIM 800-160 MG PO TABS: 1.0000 | ORAL_TABLET | ORAL | 3 refills | Status: DC

## 2019-11-04 MED ORDER — PREDNISONE 20 MG PO TABS: 40.0000 mg | ORAL_TABLET | Freq: Every day | ORAL | 2 refills | Status: DC

## 2019-11-04 NOTE — Telephone Encounter (Signed)
Per [name] I am sending in Prednisone 40mg  and Bactrim.

## 2019-11-11 NOTE — Telephone Encounter (Signed)
[name] please advise on patient email:  My primary care doctor Thornell Mule)- has forwarded the [facility] PFT results (from 11/04/19) to you for review. I am requesting to have you handle the diagnosis/ review/ future visits with you [name]- MOVING FORWARD, as [facility] is unable to get me in to their facility for the established patient/ 2nd opinion until 01/09/20). Time is not what I have right now & you have been on this case with me since the beginning, so I would appreciate your review & let me know what we need to do from here. In addition, I am curious as to my inhalers. Can I possibly/ or should I "switch" my inhalers for a better response to symptoms? I have been looking at the following choices: Spiriva (Tiotropium Bromide)..? Let me know. Also have availability for a telemed visit today/Monday if possible/ needed- or you may phone me [phone]). I will have my phone on @ all times. This week alone I have taken 1.5 days away from work -due to being unable to breathe. You're help is appreciated in the next necessary steps.

## 2019-11-14 ENCOUNTER — Other Ambulatory Visit: Payer: Self-pay | Admitting: Family Medicine

## 2019-11-14 ENCOUNTER — Other Ambulatory Visit: Payer: 59

## 2019-11-14 NOTE — Telephone Encounter (Signed)
Virtual visit scheduled with Buelah Manis, NP, for 11/15/2019. Pt's PFT results are in care everywhere.

## 2019-11-15 ENCOUNTER — Telehealth (INDEPENDENT_AMBULATORY_CARE_PROVIDER_SITE_OTHER): Payer: 59 | Admitting: Primary Care

## 2019-11-15 DIAGNOSIS — R7309 Other abnormal glucose: Secondary | ICD-10-CM

## 2019-11-15 DIAGNOSIS — J849 Interstitial pulmonary disease, unspecified: Secondary | ICD-10-CM | POA: Diagnosis not present

## 2019-11-15 NOTE — Progress Notes (Signed)
Virtual Visit via Video Note  I connected with [name] on 11/15/19 at 10:30 AM EDT by a video enabled telemedicine application and verified that I am speaking with the correct person using two identifiers.  Location: Patient: Home Provider: Home   I discussed the limitations of evaluation and management by telemedicine and the availability of in person appointments. The patient expressed understanding and agreed to proceed.  History of Present Illness: 44 year old female, never smoked. PMH ILD, allergic rhinitis, COVID-19, fibromyalgia. Patient of Dr. Vaughan Browner, last seen on March 3rd.   Developed dyspnea and chest pressure in mid-August 2020. CTA done by PCP showed bilateral diffuse pulmonary infiltrates. Covid at that time was negative. She was treated with doxycycline and azithromycin with no improvement. Follow-up CXR showed persistent interstitial abnormalities and she was given levofloxacin and started on Symbicort. Referred to pulmonary for further evaluation.   Patient had unspecified autoimmune disease and has been referred to Dr. Trudie Reed with rheumatology. She underwent bronchoscopy on 07/12/19 with findings of lymphocytosis on BAL and granulomas. Discussed at multidisciplinary conference on 08/02/19 with diagnosis of hypersensitivity pneumonitis and started on prednisone 96m daily. She was diagnosed with COVID-19 on January 13th and received monoclonal antibody therapy on January 20th.   11/15/2019 Patient contacted today for acute video visit to review PFTs. She recently was able to [facility] She continues to have dyspnea, cough, fatigue, HA and low energy. States that she helped a friend move two days ago and was around cat and dust. She had to go home and slept for 7 hours. She is taking the last day or two off from work d/t shortness of breath. She continues to use Symbicort twice daily and has been using her albuterol rescue inhaler 2-3 times a day with  no noticeable improvement.   During last visit on March 3rd her prednisone was weaned to 583mdaily. HRCT on March 10th showed increase in subpleural reticulation with ground glass, traction bronchiectasis (indeterminate for UIP fibrosis). She was previously treated for HP, possible down exposure, with steroid taper. Suspect increased ILD is d/t COVID-19 pneumonia. Prednisone increased back to 4034maily and started on Bactrim DS TID. May need to get surgical biopsy to exclude underlying interstitial process such as NSIP fibrosis. She has been started on metformin for elevated glucose readings while on oral prednisone. She has an apt this Friday with Dr. ManVaughan Browner  EXPOSURE HX: Pets: Has dogs.  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  She has a down comforter for many years and got a down pillow in 2019. She got rid of the down after bronchoscopy in late 2020 Smoking history: No significant smoking history [place]  No significant recent travel Relevant family history: Grandfather died of lung cancer.  No other significant family history of lung disease.    Observations/Objective:  - Appears well, able to speak in full sentences - No obvious respiratory distress   Assessment and Plan:  ILD: - Previously treated for hypersensitivity pneumonitis with steroid taper - HRCT 11/02/19 showed increase in subpleural reticulation with ground glass, traction bronchiectasis (indeterminate for UIP fibrosis) - Increased ILD felt to be d/t COVID-19 pneumonia in January 2021 - Prednisone resumed at 1m71mily and started on Bactrim DS tid on 3/11 - PFT's completed  at [facility] showing mild restrictive lung disease with mild diffusion defect. NO obstruction or BD response - Continue Symbicort 160 two puffs twice daily  for now  Increase glucose level: - Started on metformin per PCP  Follow Up Instructions:   - Follow up scheduled for Friday March 26th with Dr. Vaughan Browner  I discussed the assessment and treatment plan with the patient. The patient was provided an opportunity to ask questions and all were answered. The patient agreed with the plan and demonstrated an understanding of the instructions.   The patient was advised to call back or seek an in-person evaluation if the symptoms worsen or if the condition fails to improve as anticipated.  I provided 22 minutes of non-face-to-face time during this encounter.   Martyn Ehrich, NP

## 2019-11-15 NOTE — Patient Instructions (Signed)
Follow with [name] on 3/26th Continue Symbicort 160 two puffs twice daily  Continue prednisone 40mg  daily and Bactrim three times daily

## 2019-11-15 NOTE — Telephone Encounter (Signed)
Can we get the actual scan or print out of the PFTs from [facility]? As it is difficult to interpret in care everywhere without seeing the complete study, Perhaps she can bring to the upcoming visit with me on 3/26

## 2019-11-15 NOTE — Telephone Encounter (Signed)
I have faxed request to [facility] for them to fax [place] pt's full PFT report.

## 2019-11-18 ENCOUNTER — Ambulatory Visit: Payer: 59 | Admitting: Pulmonary Disease

## 2019-11-18 ENCOUNTER — Other Ambulatory Visit: Payer: Self-pay

## 2019-11-18 ENCOUNTER — Encounter: Payer: Self-pay | Admitting: Pulmonary Disease

## 2019-11-18 VITALS — BP 130/90 | HR 119 | Temp 98.0°F | Ht 66.0 in | Wt 199.4 lb

## 2019-11-18 DIAGNOSIS — J849 Interstitial pulmonary disease, unspecified: Secondary | ICD-10-CM | POA: Diagnosis not present

## 2019-11-18 NOTE — Patient Instructions (Signed)
We will check your oxygen levels on exertion today We will make referral to cardiothoracic surgery for evaluation of interstitial lung disease Given your shortness shortness of breath I would recommend that you work from home He can stop using the Symbicort on regular basis and just use it as needed going forward Follow-up in 1 to 2 months.

## 2019-11-18 NOTE — Progress Notes (Signed)
[name]    [id number]    01/17/1976  Primary Care Physician:Skakle, Liane Comber, DO  Referring Physician: Sueanne Margarita, DO [place],  [place] [id number]  Chief complaint: Follow-up for interstitial lung disease, hypersensitivity pneumonitis.  HPI: 44 year old with history of fibromyalgia, depression, anxiety Referred for evaluation of abnormal CT scan  Complains of increasing dyspnea with chest pressure since mid August 2020.  She had a CTA done by her primary care which did not show any embolism but showed bilateral diffuse pulmonary infiltrates.  COVID-19 test was negative.  She was treated with doxycycline and azithromycin with no improvement in symptoms Follow-up chest x-ray at her primary care yesterday showed persistent interstitial abnormalities and she was given Symbicort, nebs and started on levofloxacin. Negative HIV test at primary care.  Per the patient she has unspecified autoimmune disease and has been referred to Dr. Trudie Reed, rheumatology.  She had a work-up done including labs which were reportedly negative.  She underwent bronchoscopy on 07/12/2019 with findings of lymphocytosis on BAL and granulomas. Discussed at multidisciplinary conference on 08/02/2019 with diagnosis of hypersensitivity pneumonitis due to down exposure and started on prednisone at 40 mg with slow taper Diagnosed with COVID-19 on 09/07/19 and received monoclonal antibody therapy in 09/14/19.  Has seen cardiology for atypical chest pain which was felt to be secondary to lung issues.  Pets: Has dogs.  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  She has a down comforter for many years and got a down pillow in 2019. She got rid of the down after bronchoscopy in late 2020 Smoking history: No significant smoking history Travel history: Previously lived in [place], [place].  Has been living  in [place] for the past 18 years.  No significant recent travel Relevant family history: Grandfather died of lung cancer.  No other significant family history of lung disease.  Interim history:  At last visit on 3/3 prednisone was down to 5 mg but continued to have persistent symptoms with CT scan showing worsened interstitial lung disease.  Prednisone increased to 40 mg a day and continued on Symbicort  She continues to have persistent symptoms in spite of increasing prednisone dose She had PFTs at [facility] and is here for review.  Outpatient Encounter Medications as of 11/18/2019  Medication Sig  . acetaminophen (TYLENOL) 650 MG CR tablet Take 650 mg by mouth every 8 (eight) hours as needed for pain.  Marland Kitchen albuterol (VENTOLIN HFA) 108 (90 Base) MCG/ACT inhaler Inhale 2 puffs into the lungs every 6 (six) hours as needed for wheezing or shortness of breath.  . benzonatate (TESSALON) 200 MG capsule Take 1 capsule (200 mg total) by mouth 3 (three) times daily as needed for cough.  . budesonide-formoterol (SYMBICORT) 160-4.5 MCG/ACT inhaler Inhale 2 puffs into the lungs 2 (two) times daily.  . Cholecalciferol (VITAMIN D) 50 MCG (2000 UT) tablet Take 2,000 Units by mouth daily in the afternoon.  . DULoxetine (CYMBALTA) 60 MG capsule TAKE 1 CAPSULE BY MOUTH EVERY DAY  . ipratropium-albuterol (DUONEB) 0.5-2.5 (3) MG/3ML SOLN Take 3 mLs by nebulization every 4 (four) hours as needed (shortness of breath).   Marland Kitchen levothyroxine (SYNTHROID) 137 MCG tablet TAKE 1 TABLET (137 MCG TOTAL) BY MOUTH DAILY BEFORE BREAKFAST.  Marland Kitchen predniSONE (DELTASONE) 20 MG tablet Take 2 tablets (40 mg total) by mouth daily with breakfast.  .  pregabalin (LYRICA) 150 MG capsule Take 1 capsule (150 mg total) by mouth 2 (two) times daily.  Marland Kitchen sulfamethoxazole-trimethoprim (BACTRIM DS) 800-160 MG tablet Take 1 tablet by mouth 3 (three) times a week.   No facility-administered encounter medications on file as of 11/18/2019.    Physical Exam: Blood pressure 130/90, pulse (!) 119, temperature 98 F (36.7 C), temperature source Temporal, height _0  (1.676 m), weight 199 lb 6.4 oz (90.4 kg), SpO2 97 %. Gen:      No acute distress HEENT:  EOMI, sclera anicteric Neck:     No masses; no thyromegaly Lungs:    Clear to auscultation bilaterally; normal respiratory effort CV:         Regular rate and rhythm; no murmurs Abd:      + bowel sounds; soft, non-tender; no palpable masses, no distension Ext:    No edema; adequate peripheral perfusion Skin:      Warm and dry; no rash Neuro: alert and oriented x 3 Psych: normal mood and affect  Data Reviewed: Imaging: CTA 06/15/2019-no pulmonary embolism, mild diffuse interstitial prominence with bilateral groundglass and nodular airspace opacities.   High-res CT 07/11/2019-Bilateral fine nodularity with groundglass with no significant air trapping.  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 High-res CT 11/02/2019-subpleural reticulation with groundglass, traction bronchiectasis no basal gradient.  Indeterminate for UIP. I have reviewed the images personally.  PFTs: [facility] 11/04/2019 FVC 2.53 [68%], FEV1 2.26 [75%], F/F 89, SVC 1.75 [50%], DLCO 14.61 [65%] Moderate restriction, diffusion defect.  Labs: ANA 03/25/2019-negative,  Rheumatoid factor 03/25/2019- < 14 Repeat CTD serologies 07/18/2019-negative, hypersensitivity panel-negative  CBC 06/15/2019-WBC 5.87, eos 6.4%, absolute eosinophil count 867 Metabolic panel 61/[id number] normal limits D-dimer 06/30/2019-0.41 BNP 06/30/2019-10.   Bronchoscopy 07/12/2019 WBC 510, lymphocytes 38%, eos 12%, Microbiology-negative Cytology -no malignancy Transbronchial biopsy-nonnecrotizing epithelioid granulomas SARS-CoV-2, viral PCR-negative  Assessment:  Interstitial lung disease, hypersensitivity pneumonitis Post COVID-19 She had initial diagnosis of hypersensitivity pneumonitis based on bronchoscopy which showed lymphocytes,  granulomas on transbronchial biopsies and exposure to down, discussed at ILD conference in December 2020  Initially improved on prednisone but symptoms worsened after Covid infection in January 2021 It is possible that COVID-19 pneumonia may have worsened her ILD Difficult to exclude underlying interstitial process such as NSIP fibrosis unless we get surgical lung biopsy.  Currently at prednisone 40 mg a day and Bactrim 3 times a week for pneumocystis prophylaxis She has worsening symptoms in spite of increased prednisone dose PFTs from [facility] reviewed with moderate restriction or diffusion defect. She is concerned that she may have IPF and the biopsy will help [place] make diagnosis Refer to cardiothoracic surgery.  Plan discussed in detail with patient and her husband over telephone.  All questions answered.  Mild airway disease No obstruction on PFTs.  She does have improvement in mid flow rates after bronchodilator response suggestive of small airway disease but I do not see any compelling evidence of asthma She has not improved with Symbicort and we will stop this.  Hyperglycemia, elevated hemoglobin A1c This is a new problem after she has been started on prednisone Currently on Metformin per primary care with improvement  This appointment required 40 minutes of patient care (this includes precharting, chart review, review of results, face-to-face care, etc.).  Plan/Recommendations: - Continue prednisone, Bactrim - Stop Symbicort inhaler - Referral to surgery for lung biopsy  Marshell Garfinkel MD  Pulmonary and Critical Care 11/18/2019, 2:25 PM  CC: Sueanne Margarita, DO

## 2019-11-21 NOTE — Telephone Encounter (Signed)
Patient called back and I went over the recs from [facility] with her. Told her to ask cardiothoracic for their opinion as well when she gets her appointment from the referral we sent in on 3/26.   Patient expressed understanding. Nothing further needed at this time.

## 2019-11-21 NOTE — Telephone Encounter (Signed)
  Arlys John please advise...  Follow-up to my 3/26 visit: Do I (or) should I be scheduled to get the COVID-vaccination? I was tested positive for COVID back on 09/07/2019 so I should be past the #90 days I think. Thoughts on this, due to our most recent visit discussion on biopsy/ etc. not sure if I need this "after" biopsy or should schedule prior to be certain I don't get it again...  #2- Also should I get a phone call soon regarding the referral on the biopsy apt.?  Thanks- Vance Gather L. [name] [phone]

## 2019-11-21 NOTE — Telephone Encounter (Signed)
I called [facility] and spoke to [facility].  She states she was just getting ready to put pt on schedule on 4/6 at 3:00 with Dr Tyrone Sage.  She will be calling pt within the next 30 minutes to give her appt.  Nothing further needed.

## 2019-11-21 NOTE — Telephone Encounter (Signed)
11/21/2019  You still currently likely have some aspect of protection from your recent diagnosis.  We would recommend that you receive the Covid vaccine.  I would wait until you complete your follow-up with cardiothoracic surgery and potentially have your lung biopsy.  You can discuss getting the vaccine with them.  Once you are status post lung biopsy I absolutely would recommend getting the Covid vaccine.  We will route back to triage for follow-up.  We will route to [facility] to see if we can ensure the patient get scheduled with cardiothoracic surgery I see this referral was placed on 11/18/2019.  Does not look like the patient has been scheduled yet.  Will route to [name] as Lorain Childes.   Elisha Headland, FNP

## 2019-11-21 NOTE — Telephone Encounter (Signed)
ATC patient LMTCB to go over [employer]. Will wait to hear back from patient.

## 2019-11-29 ENCOUNTER — Institutional Professional Consult (permissible substitution): Payer: 59 | Admitting: Cardiothoracic Surgery

## 2019-11-29 ENCOUNTER — Ambulatory Visit: Payer: 59 | Admitting: Internal Medicine

## 2019-11-29 ENCOUNTER — Other Ambulatory Visit: Payer: Self-pay

## 2019-11-29 VITALS — BP 150/86 | HR 100 | Temp 97.6°F | Resp 20 | Ht 66.0 in | Wt 200.0 lb

## 2019-11-29 DIAGNOSIS — R0600 Dyspnea, unspecified: Secondary | ICD-10-CM

## 2019-11-29 DIAGNOSIS — R609 Edema, unspecified: Secondary | ICD-10-CM

## 2019-11-29 DIAGNOSIS — J849 Interstitial pulmonary disease, unspecified: Secondary | ICD-10-CM

## 2019-11-29 NOTE — Progress Notes (Signed)
[address]       [place] [address]             [phone]                    [facility] Medical Record #[id number] Date of Birth: Nov 19, 1975  Referring: Marshell Garfinkel, MD Primary Care: Sueanne Margarita, DO Primary Cardiologist: No primary care provider on file.  Chief Complaint:    Chief Complaint  Patient presents with  . Interstitial Lung Disease    Surgical eval,     History of Present Illness:    [name] 44 y.o. female is seen in the office  today for evaluation of open lung biopsy.  She notes at least a 67-month history of progressive dry unproductive cough with increasing shortness of breath.  In the fall 2020 a CT scan was done to rule out pulmonary embolus, this study ruled out embolus bilateral pneumonic process.  She was treated with inhalers and antibiotics for several minutes but without improvement ultimately she was referred to Dr. Hillery Jacks service.  High-definition CT scans bronchoscopy with washings suggested hypersensitivity pneumonitis-she has been started on steroids with intermittent improvement.  She notes that when the dose has been decreased her symptoms have worsened.   During the progress of the evaluation of her interstitial lung disease she notes that on January 13 of this year she was diagnosed with Covid infection.   She is now referred after follow-up definition CT scan to consider lung biopsy.   She notes that over this time she has been careful about environmental exposures-she is never a smoker, notes it is installed air filters in her house.  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  She denies hypertension hyperlipidemia no history of previous stroke peripheral vascular disease renal insufficiency, she is not known to be diabetic but recently with steroids has been started on Metformin.  Patient did have pulmonary function  studies done [facility] several weeks ago-no response to bronchodilator therapy.  Current Activity/ Functional Status:  Patient is independent with mobility/ambulation, transfers, ADL's, IADL's.   Zubrod Score: At the time of surgery this patient's most appropriate activity status/level should be described as: []     0    Normal activity, no symptoms [x]     1    Restricted in physical strenuous activity but ambulatory, able to do out light work []     2    Ambulatory and capable of self care, unable to do work activities, up and about               >50 % of waking hours                              []     3    Only limited self care, in bed greater than 50% of waking hours []     4    Completely disabled, no self care, confined to bed or chair []     5    Moribund   Past Medical History:  Diagnosis Date  . Anxiety   . Arthritis   . Depression   . Fibromyalgia   . Hypothyroidism 1999   post PTU Rx for hyperthyroidism  . IBS (irritable bowel syndrome)   . Interstitial lung disease [facility])     Past Surgical  History:  Procedure Laterality Date  . CESAREAN SECTION     x 2  . VIDEO BRONCHOSCOPY Bilateral 07/12/2019   Procedure: VIDEO BRONCHOSCOPY WITH FLUORO;  Surgeon: Chilton Greathouse, MD;  Location: [facility];  Service: Cardiopulmonary;  Laterality: Bilateral;    Family History  Problem Relation Age of Onset  . Diabetes Paternal Grandmother   . Hyperlipidemia Mother   . Diabetes Father   . Cancer Paternal Grandfather        lung  . ADD / ADHD Son   . Other Neg Hx        PCO   She notes that her paternal grandfather died of lung cancer maternal grandmother died of leukemia.  Her parents are alive father has diabetes mother has hypothyroidism  Social History   Tobacco Use  Smoking Status Never Smoker  Smokeless Tobacco Never Used    Social History   Substance and Sexual Activity  Alcohol Use Yes   Comment: occasional     Allergies  Allergen Reactions  .  Methimazole Other (See Comments)    Body goes into arthritic shock  . Bupropion     Body aches / headaches     Current Outpatient Medications  Medication Sig Dispense Refill  . acetaminophen (TYLENOL) 650 MG CR tablet Take 650 mg by mouth every 8 (eight) hours as needed for pain.    . budesonide-formoterol (SYMBICORT) 160-4.5 MCG/ACT inhaler Inhale 2 puffs into the lungs 2 (two) times daily. (Patient taking differently: Inhale 2 puffs into the lungs 2 (two) times daily as needed. ) 1 Inhaler 6  . Cholecalciferol (VITAMIN D) 50 MCG (2000 UT) tablet Take 1,000 Units by mouth daily in the afternoon.     . DULoxetine (CYMBALTA) 60 MG capsule TAKE 1 CAPSULE BY MOUTH EVERY DAY 90 capsule 2  . levothyroxine (SYNTHROID) 137 MCG tablet TAKE 1 TABLET (137 MCG TOTAL) BY MOUTH DAILY BEFORE BREAKFAST. 90 tablet 1  . metFORMIN (GLUCOPHAGE-XR) 500 MG 24 hr tablet Take 500 mg by mouth daily.    . predniSONE (DELTASONE) 20 MG tablet Take 2 tablets (40 mg total) by mouth daily with breakfast. 60 tablet 2  . pregabalin (LYRICA) 150 MG capsule Take 1 capsule (150 mg total) by mouth 2 (two) times daily. 60 capsule 3  . sulfamethoxazole-trimethoprim (BACTRIM DS) 800-160 MG tablet Take 1 tablet by mouth 3 (three) times a week. 12 tablet 3   No current facility-administered medications for this visit.    Pertinent items are noted in HPI.   Review of Systems:     Cardiac Review of Systems: [Y] = yes  or   [ N ] = no   Chest Pain [  n  ]  Resting SOB [ y  ] Exertional SOB  y[  ]  Orthopnea [n  ]   Pedal Edema [ n  ]    Palpitations [n  ] Syncope  [ n ]   Presyncope [ n  ]   General Review of Systems: [Y] = yes [  ]=no Constitional: recent weight change [  ];  Wt loss over the last 3 months [   ] anorexia [  ]; fatigue [  ]; nausea [  ]; night sweats [  ]; fever [  ]; or chills [  ];           Eye : blurred vision [  ]; diplopia [   ]; vision changes [  ];  Amaurosis fugax[  ]; Resp:  cough [  ];  wheezing[  ];   hemoptysis[  ]; shortness of breath[  ]; paroxysmal nocturnal dyspnea[  ]; dyspnea on exertion[  ]; or orthopnea[  ];  GI:  gallstones[  ], vomiting[  ];  dysphagia[  ]; melena[  ];  hematochezia [  ]; heartburn[  ];   Hx of  Colonoscopy[  ]; GU: kidney stones [  ]; hematuria[  ];   dysuria [  ];  nocturia[  ];  history of     obstruction [  ]; urinary frequency [  ]             Skin: rash, swelling[  ];, hair loss[  ];  peripheral edema[  ];  or itching[  ]; Musculosketetal: myalgias[  ];  joint swelling[  ];  joint erythema[  ];  joint pain[  ];  back pain[  ];  Heme/Lymph: bruising[  ];  bleeding[  ];  anemia[  ];  Neuro: TIA[  ];  headaches[  ];  stroke[  ];  vertigo[  ];  seizures[  ];   paresthesias[  ];  difficulty walking[  ];  Psych:depression[  ]; anxiety[  ];  Endocrine: diabetes[  ];  thyroid dysfunction[  ];  Immunizations: Flu up to date [  ]; Pneumococcal up to date [  ];  Other:     PHYSICAL EXAMINATION: BP (!) 150/86   Pulse 100   Temp 97.6 F (36.4 C)   Resp 20   Ht 5\' 6"  (1.676 m)   Wt 200 lb (90.7 kg)   SpO2 97%   BMI 32.28 kg/m  General appearance: alert, cooperative and no distress Head: Normocephalic, without obvious abnormality, atraumatic Neck: no adenopathy, no carotid bruit, no JVD, supple, symmetrical, trachea midline and thyroid not enlarged, symmetric, no tenderness/mass/nodules Resp: clear to auscultation bilaterally Cardio: regular rate and rhythm, S1, S2 normal, no murmur, click, rub or gallop GI: soft, non-tender; bowel sounds normal; no masses,  no organomegaly Extremities: extremities normal, atraumatic, no cyanosis or edema and Homans sign is negative, no sign of DVT Neurologic: Grossly normal  Diagnostic Studies & Laboratory data:     Recent Radiology Findings:   CT Chest High Resolution  Result Date: 11/02/2019 CLINICAL DATA:  Interstitial lung disease. COVID in January. Worsening symptoms. EXAM: CT CHEST WITHOUT CONTRAST TECHNIQUE:  Multidetector CT imaging of the chest was performed following the standard protocol without intravenous contrast. High resolution imaging of the lungs, as well as inspiratory and expiratory imaging, was performed. COMPARISON:  07/01/2019 and 06/15/2019. FINDINGS: Cardiovascular: Heart size normal.  No pericardial effusion. Mediastinum/Nodes: Low right paratracheal lymph node measures 1.3 cm but has a fatty hilum and is unchanged from 07/01/2019. No pathologically enlarged mediastinal or axillary lymph nodes. Hilar regions are difficult to definitively evaluate without IV contrast. Esophagus is grossly unremarkable. Lungs/Pleura: Subpleural reticulation and ground-glass with traction bronchiectasis/bronchiolectasis and without a definite zonal predominance. If anything, there is sparing of the costophrenic angles. Findings appears slightly more organized than on 07/01/2019 and certainly more so than on 06/15/2019. Possible single layer honeycombing. No pleural fluid. Airway is unremarkable. There may be minimal air trapping. Upper Abdomen: Visualized portions of the liver, gallbladder, adrenal glands, kidneys, spleen, pancreas, stomach and bowel are grossly unremarkable. Musculoskeletal: Degenerative changes in the spine. No worrisome lytic or sclerotic lesions. IMPRESSION: Pulmonary parenchymal pattern of fibrosis appears slightly more organized than on 07/01/2019 and certainly from 06/15/2019. Findings may be due to nonspecific interstitial pneumonitis. Findings  are indeterminate for UIP per consensus guidelines: Diagnosis of Idiopathic Pulmonary Fibrosis: An Official ATS/ERS/JRS/ALAT Clinical Practice Guideline. Am Rosezetta Schlatter Crit Care Med Vol 198, Iss 5, [phone], Apr 25 2017. Electronically Signed   By: Leanna Battles M.D.   On: 11/02/2019 13:38     I have independently reviewed the above radiology studies  and reviewed the findings with the patient.   Recent Lab Findings: Lab Results  Component Value Date    WBC 6.9 10/26/2019   HGB 12.4 10/26/2019   HCT 37.7 10/26/2019   PLT 281.0 10/26/2019   GLUCOSE 191 (H) 10/26/2019   CHOL 184 06/23/2018   TRIG 136.0 06/23/2018   HDL 71.00 06/23/2018   LDLCALC 86 06/23/2018   ALT 15 10/26/2019   AST 20 10/26/2019   NA 137 10/26/2019   K 3.7 10/26/2019   CL 101 10/26/2019   CREATININE 0.75 10/26/2019   BUN 15 10/26/2019   CO2 29 10/26/2019   TSH 1.78 05/25/2019   INR 1.1 (H) 07/01/2019   HGBA1C 7.7 (H) 11/01/2019      Assessment / Plan:   #1 interstitial lung disease-previous examination with bronchoscopy suggested hypersensitivity pneumonitis-with progression of symptoms and dependence on steroids patient is referred for open lung biopsy.  This is a reasonable next step in her evaluation.  I discussed pros and cons of biopsy with her and her husband.  We discussed proceeding with robotic assisted lung biopsies-each side appears relatively similar into the degree of radiographic changes-I have recommended to him proceeding with right lung biopsy.   Patient would like to proceed with lung biopsy tentatively the week of April 19-we will make arrangements accordingly     Delight Ovens MD      [address] [place] [place] [address] Office [phone]     11/29/2019 4:37 PM

## 2019-11-30 ENCOUNTER — Telehealth: Payer: Self-pay | Admitting: Family Medicine

## 2019-11-30 ENCOUNTER — Encounter: Payer: Self-pay | Admitting: *Deleted

## 2019-11-30 ENCOUNTER — Other Ambulatory Visit: Payer: Self-pay | Admitting: *Deleted

## 2019-11-30 DIAGNOSIS — J849 Interstitial pulmonary disease, unspecified: Secondary | ICD-10-CM

## 2019-11-30 NOTE — Telephone Encounter (Signed)
Please make sure my name is removed from all prescriptions as this seems to be terribly upsetting for pt.  I wish her the best

## 2019-11-30 NOTE — Telephone Encounter (Signed)
Called pharmacy and they removed your name from all prescriptions and canceled the cymbalta that was filled 11/14/19.

## 2019-11-30 NOTE — Telephone Encounter (Signed)
Pt called in stating that she is no longer a pt of Dr. Beverely Low. She states that on 2 separate occasions her Cymbalta and her levothyroxine has been refilled. She states that she doesn't want anyone from our office to be on her chart and she will take it to upper management if she needs to. I have made pt aware that we more then likely got a request from the pharmacy and it was an over sight on our end that the medication was filled.

## 2019-11-30 NOTE — Telephone Encounter (Signed)
We last checked kidney function on 3/3 and they were normal. Sometimes the prednisone can cause swelling Lets try some lasix to get the swelling down. It think we should check labs again and also a chest x ray before your upcoming biopsy to make sure everything is ok.  Erica- Please order CMP, CBC, BNP, chest x ray And lasix 20 mg/day. Follow up with me or APP after the labs.  Chilton Greathouse MD [facility] Please see [email or web address] for pager details.  11/30/2019, 4:59 PM

## 2019-11-30 NOTE — Telephone Encounter (Signed)
Received the following message from patient:   "Saw cardiac thoracic surgeon this afternoon. Going to schedule the biopsy soon. Have several questions regarding "when" to essentially take a leave of absence from work during this process and the process of my ILD. Possibly we could discuss this week.  *In addition, this evening my ankles swelled up really bad. Images attached. Not usual for me but has occurred prior to ILD diagnosis. Have we checked my kidney function by chance? Might be a question for primary/ unsure. Thanks! [name] [phone]"  She has attached pictures of her ankles and feet. I asked her if she was having any shortness of breath associated with the swelling. Will await her response.   [name], please advise. Thanks!

## 2019-12-01 ENCOUNTER — Ambulatory Visit: Payer: 59 | Admitting: Pulmonary Disease

## 2019-12-01 MED ORDER — FUROSEMIDE 20 MG PO TABS: 20.0000 mg | ORAL_TABLET | Freq: Every day | ORAL | 1 refills | Status: DC

## 2019-12-01 NOTE — Telephone Encounter (Signed)
Have we received any short term paperwork on this patient from her employer?

## 2019-12-02 ENCOUNTER — Ambulatory Visit (INDEPENDENT_AMBULATORY_CARE_PROVIDER_SITE_OTHER): Payer: 59

## 2019-12-02 ENCOUNTER — Other Ambulatory Visit (INDEPENDENT_AMBULATORY_CARE_PROVIDER_SITE_OTHER): Payer: 59

## 2019-12-02 DIAGNOSIS — R0600 Dyspnea, unspecified: Secondary | ICD-10-CM

## 2019-12-02 DIAGNOSIS — R609 Edema, unspecified: Secondary | ICD-10-CM | POA: Diagnosis not present

## 2019-12-02 DIAGNOSIS — R7309 Other abnormal glucose: Secondary | ICD-10-CM

## 2019-12-02 LAB — COMPREHENSIVE METABOLIC PANEL
ALT: 15 U/L (ref 0–35)
AST: 16 U/L (ref 0–37)
Albumin: 3.8 g/dL (ref 3.5–5.2)
Alkaline Phosphatase: 97 U/L (ref 39–117)
BUN: 13 mg/dL (ref 6–23)
CO2: 26 mEq/L (ref 19–32)
Calcium: 8.9 mg/dL (ref 8.4–10.5)
Chloride: 100 mEq/L (ref 96–112)
Creatinine, Ser: 0.76 mg/dL (ref 0.40–1.20)
GFR: 82.46 mL/min (ref >60.00)
Glucose, Bld: 148 mg/dL — ABNORMAL HIGH (ref 70–99)
Potassium: 3.5 mEq/L (ref 3.5–5.1)
Sodium: 138 mEq/L (ref 135–145)
Total Bilirubin: 0.4 mg/dL (ref 0.2–1.2)
Total Protein: 6.2 g/dL (ref 6.0–8.3)

## 2019-12-02 LAB — CBC WITH DIFFERENTIAL/PLATELET
Basophils Absolute: 0 10*3/uL (ref 0.0–0.1)
Basophils Relative: 0.2 % (ref 0.0–3.0)
Eosinophils Absolute: 0 10*3/uL (ref 0.0–0.7)
Eosinophils Relative: 0.4 % (ref 0.0–5.0)
HCT: 38.3 % (ref 36.0–46.0)
Hemoglobin: 12.5 g/dL (ref 12.0–15.0)
Lymphocytes Relative: 15.4 % (ref 12.0–46.0)
Lymphs Abs: 1.6 10*3/uL (ref 0.7–4.0)
MCHC: 32.7 g/dL (ref 30.0–36.0)
MCV: 87.8 fl (ref 78.0–100.0)
Monocytes Absolute: 0.9 10*3/uL (ref 0.1–1.0)
Monocytes Relative: 8.2 % (ref 3.0–12.0)
Neutro Abs: 7.9 10*3/uL — ABNORMAL HIGH (ref 1.4–7.7)
Neutrophils Relative %: 75.8 % (ref 43.0–77.0)
Platelets: 308 10*3/uL (ref 150.0–400.0)
RBC: 4.36 Mil/uL (ref 3.87–5.11)
RDW: 15.5 % (ref 11.5–15.5)
WBC: 10.5 10*3/uL (ref 4.0–10.5)

## 2019-12-02 LAB — BRAIN NATRIURETIC PEPTIDE: Pro B Natriuretic peptide (BNP): 24 pg/mL (ref 0.0–100.0)

## 2019-12-02 LAB — HEMOGLOBIN A1C: Hgb A1c MFr Bld: 8.6 % — ABNORMAL HIGH (ref 4.6–6.5)

## 2019-12-02 NOTE — Addendum Note (Signed)
Addended by: Demetrio Lapping E on: 12/02/2019 08:31 AM   Modules accepted: Orders

## 2019-12-07 NOTE — Pre-Procedure Instructions (Signed)
[id number] [name], [place] - [address] [place] [address] Phone: [phone] Fax: [phone]    Your procedure is scheduled on Mon., December 12, 2019 from 7:30AM-10:30AM  Report to [facility] Entrance "A" at 5:30AM  Call this number if you have problems the morning of surgery:  [phone]   Remember:  Do not eat or drink after midnight on April 18th    Take these medicines the morning of surgery with A SIP OF WATER: Duloxetine (CYMBALTA)      Levothyroxine (SYNTHROID)   PredniSONE (DELTASONE) Pregabalin (LYRICA)  If Needed: Acetaminophen (TYLENOL) Budesonide-formoterol (SYMBICORT)-bring with you the day of surgery  As of today, STOP taking all Aspirin (unless instructed by your doctor) and Other Aspirin containing products, Vitamins, Fish oils, and Herbal medications. Also stop all NSAIDS i.e. Advil, Ibuprofen, Motrin, Aleve, Anaprox, Naproxen, BC, Goody Powders, and all Supplements.   How to Manage Your Diabetes Before and After Surgery  Why is it important to control my blood sugar before and after surgery? . Improving blood sugar levels before and after surgery helps healing and can limit problems. . A way of improving blood sugar control is eating a healthy diet by: o  Eating less sugar and carbohydrates o  Increasing activity/exercise o  Talking with your doctor about reaching your blood sugar goals . High blood sugars (greater than 180 mg/dL) can raise your risk of infections and slow your recovery, so you will need to focus on controlling your diabetes during the weeks before surgery. . Make sure that the doctor who takes care of your diabetes knows about your planned surgery including the date and location.  How do I manage my blood sugar before surgery? . Check your blood sugar at least 4 times a day, starting 2 days before surgery, to make sure that the level is not too high or low. o Check your blood sugar  the morning of your surgery when you wake up and every 2 hours until you get to the Short Stay unit. . If your blood sugar is less than 70 mg/dL, you will need to treat for low blood sugar: o Do not take insulin. o Treat a low blood sugar (less than 70 mg/dL) with  cup of clear juice (cranberry or apple), 4 glucose tablets, OR glucose gel. Recheck blood sugar in 15 minutes after treatment (to make sure it is greater than 70 mg/dL). If your blood sugar is not greater than 70 mg/dL on recheck, call [phone] o  for further instructions.                                    . If your CBG is greater than 220 mg/dL, inform the staff upon arrival to Short Stay.   . If you are admitted to the hospital after surgery: o Your blood sugar will be checked by the staff and you will probably be given insulin after surgery (instead of oral diabetes medicines) to make sure you have good blood sugar levels. o The goal for blood sugar control after surgery is 80-180 mg/dL.  Reviewed and Endorsed by [facility] Patient Education Committee, August 2015   No Smoking of any kind, Tobacco, or Alcohol products 24 hours prior to your procedure. If you use a Cpap at night, you may bring all equipment for your overnight stay.   Special instructions:   [facility]- Preparing For Surgery  Before surgery, you can play an important role. Because skin is not sterile, your skin needs to be as free of germs as possible. You can reduce the number of germs on your skin by washing with CHG (chlorahexidine gluconate) Soap before surgery.  CHG is an antiseptic cleaner which kills germs and bonds with the skin to continue killing germs even after washing.    Please do not use if you have an allergy to CHG or antibacterial soaps. If your skin becomes reddened/irritated stop using the CHG.  Do not shave (including legs and underarms) for at least 48 hours prior to first CHG shower. It is OK to shave your face.  Please follow  these instructions carefully.   1. Shower the NIGHT BEFORE SURGERY and the MORNING OF SURGERY with CHG.   2. If you chose to wash your hair, wash your hair first as usual with your normal shampoo.  3. After you shampoo, rinse your hair and body thoroughly to remove the shampoo.  4. Use CHG as you would any other liquid soap. You can apply CHG directly to the skin and wash gently with a scrungie or a clean washcloth.   5. Apply the CHG Soap to your body ONLY FROM THE NECK DOWN.  Do not use on open wounds or open sores. Avoid contact with your eyes, ears, mouth and genitals (private parts). Wash Face and genitals (private parts)  with your normal soap.  6. Wash thoroughly, paying special attention to the area where your surgery will be performed.  7. Thoroughly rinse your body with warm water from the neck down.  8. DO NOT shower/wash with your normal soap after using and rinsing off the CHG Soap.  9. Pat yourself dry with a CLEAN TOWEL.  10. Wear CLEAN PAJAMAS to bed the night before surgery, wear comfortable clothes the morning of surgery  11. Place CLEAN SHEETS on your bed the night of your first shower and DO NOT SLEEP WITH PETS.  Day of Surgery:            Remember to brush your teeth WITH YOUR REGULAR TOOTHPASTE.  Do not wear jewelry, make-up or nail polish.  Do not wear lotions, powders, or perfumes, or deodorant.  Do not shave 48 hours prior to surgery.    Do not bring valuables to the hospital.  [facility] is not responsible for any belongings or valuables.  Contacts, dentures or bridgework may not be worn into surgery.  For patients admitted to the hospital, discharge time will be determined by your treatment team.  Patients discharged the day of surgery will not be allowed to drive home, and someone age 32 and over needs to stay with them for 24 hours.  Please wear clean clothes to the hospital/surgery center.    Please read over the following fact sheets that you  were given.

## 2019-12-08 ENCOUNTER — Other Ambulatory Visit: Payer: Self-pay

## 2019-12-08 ENCOUNTER — Encounter (HOSPITAL_COMMUNITY): Payer: Self-pay

## 2019-12-08 ENCOUNTER — Other Ambulatory Visit (HOSPITAL_COMMUNITY)
Admission: RE | Admit: 2019-12-08 | Discharge: 2019-12-08 | Disposition: A | Discharge status: Home / Self Care | Payer: 59 | Source: Ambulatory Visit | Attending: Cardiothoracic Surgery | Admitting: Cardiothoracic Surgery

## 2019-12-08 ENCOUNTER — Encounter (HOSPITAL_COMMUNITY)
Admission: RE | Admit: 2019-12-08 | Discharge: 2019-12-08 | Disposition: A | Discharge status: Home / Self Care | Payer: 59 | Source: Ambulatory Visit | Attending: Cardiothoracic Surgery | Admitting: Cardiothoracic Surgery

## 2019-12-08 DIAGNOSIS — Z01818 Encounter for other preprocedural examination: Secondary | ICD-10-CM | POA: Diagnosis present

## 2019-12-08 DIAGNOSIS — Z20822 Contact with and (suspected) exposure to covid-19: Secondary | ICD-10-CM | POA: Diagnosis not present

## 2019-12-08 DIAGNOSIS — J849 Interstitial pulmonary disease, unspecified: Secondary | ICD-10-CM

## 2019-12-08 HISTORY — DX: Gastro-esophageal reflux disease without esophagitis: K21.9

## 2019-12-08 HISTORY — DX: Type 2 diabetes mellitus without complications: E11.9

## 2019-12-08 HISTORY — DX: Dyspnea, unspecified: R06.00

## 2019-12-08 HISTORY — DX: Headache, unspecified: R51.9

## 2019-12-08 LAB — URINALYSIS, ROUTINE W REFLEX MICROSCOPIC
Bilirubin Urine: NEGATIVE
Glucose, UA: NEGATIVE mg/dL
Hgb urine dipstick: NEGATIVE
Ketones, ur: NEGATIVE mg/dL
Leukocytes,Ua: NEGATIVE
Nitrite: NEGATIVE
Protein, ur: NEGATIVE mg/dL
Specific Gravity, Urine: 1.008 (ref 1.005–1.030)
pH: 7 (ref 5.0–8.0)

## 2019-12-08 LAB — CBC
HCT: 42.7 % (ref 36.0–46.0)
Hemoglobin: 13.6 g/dL (ref 12.0–15.0)
MCH: 28.1 pg (ref 26.0–34.0)
MCHC: 31.9 g/dL (ref 30.0–36.0)
MCV: 88.2 fL (ref 80.0–100.0)
Platelets: 313 10*3/uL (ref 150–400)
RBC: 4.84 MIL/uL (ref 3.87–5.11)
RDW: 14 % (ref 11.5–15.5)
WBC: 10.4 10*3/uL (ref 4.0–10.5)
nRBC: 0 % (ref 0.0–0.2)

## 2019-12-08 LAB — BLOOD GAS, ARTERIAL
Acid-Base Excess: 4 mmol/L — ABNORMAL HIGH (ref 0.0–2.0)
Bicarbonate: 27.1 mmol/L (ref 20.0–28.0)
FIO2: 21
O2 Saturation: 96.5 %
Patient temperature: 37
pCO2 arterial: 33.9 mmHg (ref 32.0–48.0)
pH, Arterial: 7.513 — ABNORMAL HIGH (ref 7.350–7.450)
pO2, Arterial: 120 mmHg — ABNORMAL HIGH (ref 83.0–108.0)

## 2019-12-08 LAB — TYPE AND SCREEN
ABO/RH(D): O POS
Antibody Screen: NEGATIVE

## 2019-12-08 LAB — COMPREHENSIVE METABOLIC PANEL
ALT: 21 U/L (ref 0–44)
AST: 28 U/L (ref 15–41)
Albumin: 3.8 g/dL (ref 3.5–5.0)
Alkaline Phosphatase: 90 U/L (ref 38–126)
Anion gap: 19 — ABNORMAL HIGH (ref 5–15)
BUN: 14 mg/dL (ref 6–20)
CO2: 22 mmol/L (ref 22–32)
Calcium: 8.5 mg/dL — ABNORMAL LOW (ref 8.9–10.3)
Chloride: 100 mmol/L (ref 98–111)
Creatinine, Ser: 0.87 mg/dL (ref 0.44–1.00)
GFR calc Af Amer: >60 mL/min (ref >60)
GFR calc non Af Amer: >60 mL/min (ref >60)
Glucose, Bld: 137 mg/dL — ABNORMAL HIGH (ref 70–99)
Potassium: 3.4 mmol/L — ABNORMAL LOW (ref 3.5–5.1)
Sodium: 141 mmol/L (ref 135–145)
Total Bilirubin: 0.6 mg/dL (ref 0.3–1.2)
Total Protein: 6.9 g/dL (ref 6.5–8.1)

## 2019-12-08 LAB — SURGICAL PCR SCREEN
MRSA, PCR: NEGATIVE
Staphylococcus aureus: NEGATIVE

## 2019-12-08 LAB — APTT: aPTT: 23 seconds — ABNORMAL LOW (ref 24–36)

## 2019-12-08 LAB — ABO/RH: ABO/RH(D): O POS

## 2019-12-08 LAB — PROTIME-INR
INR: 0.8 (ref 0.8–1.2)
Prothrombin Time: 11.4 seconds (ref 11.4–15.2)

## 2019-12-08 LAB — GLUCOSE, CAPILLARY: Glucose-Capillary: 136 mg/dL — ABNORMAL HIGH (ref 70–99)

## 2019-12-08 LAB — SARS CORONAVIRUS 2 (TAT 6-24 HRS): SARS Coronavirus 2: NEGATIVE

## 2019-12-08 NOTE — Progress Notes (Signed)
PCP - Charlane Ferretti, MD Cardiologist - Denies Pulmonologist- Chilton Greathouse, MD  PPM/ICD - Denies  Chest x-ray - DOS EKG - 12/08/19 Stress Test -  Denies ECHO - 07/04/19 Cardiac Cath - Denies  Sleep Study - Denies  Fasting Blood Sugar: 150-153 Checks Blood Sugar  daily, before breakfast.  CBG at PAT appointment was  136 Last HgbA1c was 8.6 on 12/02/19.   Blood Thinner Instructions: N/A Aspirin Instructions: N/A  ERAS Protcol - N/A PRE-SURGERY Ensure or G2- N/A  COVID TEST- 12/08/19 @ 1035 @ [facility]   Anesthesia review: Yes, elevated Hgb A1C 8.6 on 12/02/19.  Patient denies shortness of breath, fever, cough and chest pain at PAT appointment.   All instructions explained to the patient, with a verbal understanding of the material. Patient agrees to go over the instructions while at home for a better understanding. Patient also instructed to self quarantine after being tested for COVID-19. The opportunity to ask questions was provided.

## 2019-12-09 NOTE — Anesthesia Preprocedure Evaluation (Addendum)
Anesthesia Evaluation  Patient identified by MRN, date of birth, ID band Patient awake    Reviewed: Allergy & Precautions, NPO status , Patient's Chart, lab work & pertinent test results  Airway Mallampati: II  TM Distance: >3 FB Neck ROM: Full    Dental no notable dental hx.    Pulmonary shortness of breath, with exertion and at rest,    Pulmonary exam normal breath sounds clear to auscultation       Cardiovascular negative cardio ROS Normal cardiovascular exam Rhythm:Regular Rate:Normal  ECG: NSR, rate 93  ECHO: 1. Left ventricular ejection fraction, by visual estimation, is 65 to 70%. The left ventricle has normal function. There is no left ventricular hypertrophy. 2. Global right ventricle has normal systolic function.The right ventricular size is normal. No increase in right ventricular wall thickness. 3. Left atrial size was mildly dilated. 4. Right atrial size was normal. 5. The mitral valve is normal in structure. Trace mitral valve regurgitation. 6. The tricuspid valve is normal in structure. Tricuspid valve regurgitation is trivial. 7. The aortic valve is normal in structure. Aortic valve regurgitation is not visualized. 8. The pulmonic valve was not well visualized. Pulmonic valve regurgitation is not visualized. 9. Normal pulmonary artery systolic pressure.   Neuro/Psych  Headaches, PSYCHIATRIC DISORDERS Anxiety Depression    GI/Hepatic Neg liver ROS, GERD  Medicated and Controlled,IBS (irritable bowel syndrome)   Endo/Other  diabetes, Oral Hypoglycemic AgentsHypothyroidism   Renal/GU negative Renal ROS     Musculoskeletal  (+) Fibromyalgia -  Abdominal (+) + obese,   Peds  Hematology negative hematology ROS (+)   Anesthesia Other Findings ILD  Reproductive/Obstetrics                           Anesthesia Physical Anesthesia Plan  ASA: III  Anesthesia Plan: General    Post-op Pain Management:    Induction: Intravenous  PONV Risk Score and Plan: 3 and Ondansetron, Dexamethasone, Midazolam and Treatment may vary due to age or medical condition  Airway Management Planned: Double Lumen EBT  Additional Equipment: Arterial line  Intra-op Plan:   Post-operative Plan: Possible Post-op intubation/ventilation and Extubation in OR  Informed Consent: I have reviewed the patients History and Physical, chart, labs and discussed the procedure including the risks, benefits and alternatives for the proposed anesthesia with the patient or authorized representative who has indicated his/her understanding and acceptance.     Dental advisory given  Plan Discussed with: CRNA  Anesthesia Plan Comments: (Per PA-C: Pt is followed by pulmonology for ILD/hypersensitivity pneumonitis.  She had initial diagnosis of hypersensitivity pneumonitis based on bronchoscopy which showed lymphocytes, granulomas on transbronchial biopsies and exposure to down, discussed at ILD conference in December 2020. Initially improved on prednisone but symptoms worsened after Covid infection in January 2021. Currently at prednisone 40 mg a day and Bactrim 3 times a week for pneumocystis prophylaxis. Worsening symptoms in spite of increased prednisone dose.  She also describes intermittent chest pain and was seen by cardiologist Dr. Jens Som 10/18/19. Per his note, her symptoms of chest pain were extremely atypical, EKG without ST changes - felt likely pulmonary etiology, no cardiac testing at this time.   Recent diagnosis of DMII felt due to prednisone. On metformin. Last A1c 8.6 on 12/02/19 (up from 7.7 on 11/01/19). [name] commented on increased A1c and advised pt to followup with PCP for further management. I sent a message to Darius Bump, RN in Dr. Dennie Maizes office  to make him aware of poor DMII control.   EKG 12/09/19: NSR. Rate 93.  PFT 11/04/19 (care everywhere): Component Name Value Ref  Range FVC Pre-Actual 2.53 L FVC %Pred-Pre 67.9 % FVC Post 2.53 L FVC %Pred-Post 67.9 % FVC LLN 2.95 L FEV1 Pre-Actual 2.26 L FEV1 %Pred-Pre 75.0 % FEV1 Post 2.33 L FEV1 %Pred-Post 77.2 % FEV1 LLN 2.38 L FEV1FVC Pre-Actual 89 % FEV1FVC Post 92 % FEV1FVC LLN 70 % SVC Pre-Actual 1.75 L SVC %Pred-Pre 50.0 % DLCOUNC Pre-Actual 14.61 ml/min/mmHg DLCOUNC %Pred-Pre 65.5 % DLCOUNC LLN 17.25 ml/min/mmHg DLVA %Pred-Pre 95.4 % FEV1SVC Pre-Actual 130 %  TTE 07/04/19: 1. Left ventricular ejection fraction, by visual estimation, is 65 to  70%. The left ventricle has normal function. There is no left ventricular  hypertrophy.  2. Global right ventricle has normal systolic function.The right  ventricular size is normal. No increase in right ventricular wall  thickness.  3. Left atrial size was mildly dilated.  4. Right atrial size was normal.  5. The mitral valve is normal in structure. Trace mitral valve  regurgitation.  6. The tricuspid valve is normal in structure. Tricuspid valve  regurgitation is trivial.  7. The aortic valve is normal in structure. Aortic valve regurgitation is  not visualized.  8. The pulmonic valve was not well visualized. Pulmonic valve  regurgitation is not visualized.  9. Normal pulmonary artery systolic pressure.  )      Anesthesia Quick Evaluation

## 2019-12-09 NOTE — Telephone Encounter (Signed)
Rec'd disability paperwork via interoffice mail from [employer] - Will fwd to [name] on 12/13/2019 when he returns to the office -pr

## 2019-12-09 NOTE — Progress Notes (Signed)
Anesthesia Chart Review:  Pt is followed by pulmonology for ILD/hypersensitivity pneumonitis.  She had initial diagnosis of hypersensitivity pneumonitis based on bronchoscopy which showed lymphocytes, granulomas on transbronchial biopsies and exposure to down, discussed at ILD conference in December 2020. Initially improved on prednisone but symptoms worsened after Covid infection in January 2021. Currently at prednisone 40 mg a day and Bactrim 3 times a week for pneumocystis prophylaxis. Worsening symptoms in spite of increased prednisone dose.  She also describes intermittent chest pain and was seen by cardiologist Dr. Jens Som 10/18/19. Per his note, her symptoms of chest pain were extremely atypical, EKG without ST changes - felt likely pulmonary etiology, no cardiac testing at this time.   Recent diagnosis of DMII felt due to prednisone. On metformin. Last A1c 8.6 on 12/02/19 (up from 7.7 on 11/01/19). [name] commented on increased A1c and advised pt to followup with PCP for further management. I sent a message to Darius Bump, RN in Dr. Dennie Maizes office to make him aware of poor DMII control.   EKG 12/09/19: NSR. Rate 93.  PFT 11/04/19 (care everywhere): Component Name Value Ref Range  FVC Pre-Actual 2.53 L  FVC %Pred-Pre 67.9 %  FVC Post 2.53 L  FVC %Pred-Post 67.9 %  FVC LLN 2.95 L  FEV1 Pre-Actual 2.26 L  FEV1 %Pred-Pre 75.0 %  FEV1 Post 2.33 L  FEV1 %Pred-Post 77.2 %  FEV1 LLN 2.38 L  FEV1FVC Pre-Actual 89 %  FEV1FVC Post 92 %  FEV1FVC LLN 70 %  SVC Pre-Actual 1.75 L  SVC %Pred-Pre 50.0 %  DLCOUNC Pre-Actual 14.61 ml/min/mmHg  DLCOUNC %Pred-Pre 65.5 %  DLCOUNC LLN 17.25 ml/min/mmHg  DLVA %Pred-Pre 95.4 %  FEV1SVC Pre-Actual 130 %   TTE 07/04/19: 1. Left ventricular ejection fraction, by visual estimation, is 65 to  70%. The left ventricle has normal function. There is no left ventricular  hypertrophy.  2. Global right ventricle has normal systolic function.The right   ventricular size is normal. No increase in right ventricular wall  thickness.  3. Left atrial size was mildly dilated.  4. Right atrial size was normal.  5. The mitral valve is normal in structure. Trace mitral valve  regurgitation.  6. The tricuspid valve is normal in structure. Tricuspid valve  regurgitation is trivial.  7. The aortic valve is normal in structure. Aortic valve regurgitation is  not visualized.  8. The pulmonic valve was not well visualized. Pulmonic valve  regurgitation is not visualized.  9. Normal pulmonary artery systolic pressure.    Zannie Cove [facility] Short Stay Center/Anesthesiology Phone [phone] 12/09/2019 9:25 AM

## 2019-12-12 ENCOUNTER — Inpatient Hospital Stay (HOSPITAL_COMMUNITY)
Admission: RE | Admit: 2019-12-12 | Discharge: 2019-12-14 | DRG: 167 | Disposition: A | Discharge status: Home / Self Care | Payer: 59 | Attending: Cardiothoracic Surgery | Admitting: Cardiothoracic Surgery

## 2019-12-12 ENCOUNTER — Inpatient Hospital Stay (HOSPITAL_COMMUNITY): Payer: 59

## 2019-12-12 ENCOUNTER — Other Ambulatory Visit: Payer: Self-pay

## 2019-12-12 ENCOUNTER — Encounter (HOSPITAL_COMMUNITY): Payer: Self-pay | Admitting: Cardiothoracic Surgery

## 2019-12-12 ENCOUNTER — Inpatient Hospital Stay (HOSPITAL_COMMUNITY): Payer: 59 | Admitting: Physician Assistant

## 2019-12-12 ENCOUNTER — Inpatient Hospital Stay (HOSPITAL_COMMUNITY): Payer: 59 | Admitting: Certified Registered Nurse Anesthetist

## 2019-12-12 ENCOUNTER — Encounter (HOSPITAL_COMMUNITY)
Admission: RE | Disposition: A | Discharge status: Home / Self Care | Payer: Self-pay | Source: Home / Self Care | Attending: Cardiothoracic Surgery

## 2019-12-12 DIAGNOSIS — Z833 Family history of diabetes mellitus: Secondary | ICD-10-CM

## 2019-12-12 DIAGNOSIS — K219 Gastro-esophageal reflux disease without esophagitis: Secondary | ICD-10-CM | POA: Diagnosis present

## 2019-12-12 DIAGNOSIS — Z83438 Family history of other disorder of lipoprotein metabolism and other lipidemia: Secondary | ICD-10-CM

## 2019-12-12 DIAGNOSIS — J849 Interstitial pulmonary disease, unspecified: Secondary | ICD-10-CM | POA: Diagnosis present

## 2019-12-12 DIAGNOSIS — Z9689 Presence of other specified functional implants: Secondary | ICD-10-CM

## 2019-12-12 DIAGNOSIS — D62 Acute posthemorrhagic anemia: Secondary | ICD-10-CM | POA: Diagnosis not present

## 2019-12-12 DIAGNOSIS — Z4682 Encounter for fitting and adjustment of non-vascular catheter: Secondary | ICD-10-CM

## 2019-12-12 DIAGNOSIS — Z801 Family history of malignant neoplasm of trachea, bronchus and lung: Secondary | ICD-10-CM

## 2019-12-12 DIAGNOSIS — K589 Irritable bowel syndrome without diarrhea: Secondary | ICD-10-CM | POA: Diagnosis present

## 2019-12-12 DIAGNOSIS — Z8616 Personal history of COVID-19: Secondary | ICD-10-CM

## 2019-12-12 DIAGNOSIS — Z09 Encounter for follow-up examination after completed treatment for conditions other than malignant neoplasm: Secondary | ICD-10-CM

## 2019-12-12 DIAGNOSIS — Z806 Family history of leukemia: Secondary | ICD-10-CM | POA: Diagnosis not present

## 2019-12-12 HISTORY — PX: INTERCOSTAL NERVE BLOCK: SHX5021

## 2019-12-12 LAB — GLUCOSE, CAPILLARY
Glucose-Capillary: 128 mg/dL — ABNORMAL HIGH (ref 70–99)
Glucose-Capillary: 213 mg/dL — ABNORMAL HIGH (ref 70–99)
Glucose-Capillary: 272 mg/dL — ABNORMAL HIGH (ref 70–99)
Glucose-Capillary: 207 mg/dL — ABNORMAL HIGH (ref 70–99)
Glucose-Capillary: 254 mg/dL — ABNORMAL HIGH (ref 70–99)

## 2019-12-12 LAB — POCT PREGNANCY, URINE: Preg Test, Ur: NEGATIVE

## 2019-12-12 SURGERY — LOBECTOMY, LUNG, ROBOT-ASSISTED, USING VATS
Anesthesia: General | Site: Chest | Laterality: Right

## 2019-12-12 MED ORDER — HYDROMORPHONE HCL 1 MG/ML IJ SOLN
INTRAMUSCULAR | Status: AC
  Filled 2019-12-12: qty 1

## 2019-12-12 MED ORDER — LACTATED RINGERS IV SOLN: INTRAVENOUS | Status: DC | PRN

## 2019-12-12 MED ORDER — SENNOSIDES-DOCUSATE SODIUM 8.6-50 MG PO TABS
1.0000 | ORAL_TABLET | Freq: Every day | ORAL | Status: DC
  Administered 2019-12-12 – 2019-12-13 (×2): 1 via ORAL
  Filled 2019-12-12 (×2): qty 1

## 2019-12-12 MED ORDER — ACETAMINOPHEN 500 MG PO TABS: 1000.0000 mg | ORAL_TABLET | Freq: Once | ORAL | Status: AC

## 2019-12-12 MED ORDER — DEXAMETHASONE SODIUM PHOSPHATE 10 MG/ML IJ SOLN
INTRAMUSCULAR | Status: DC | PRN
  Administered 2019-12-12: 10 mg via INTRAVENOUS

## 2019-12-12 MED ORDER — HEMOSTATIC AGENTS (NO CHARGE) OPTIME
TOPICAL | Status: DC | PRN
  Administered 2019-12-12: 2 via TOPICAL

## 2019-12-12 MED ORDER — PROPOFOL 10 MG/ML IV BOLUS
INTRAVENOUS | Status: AC
  Filled 2019-12-12: qty 20

## 2019-12-12 MED ORDER — KETOROLAC TROMETHAMINE 15 MG/ML IJ SOLN
15.0000 mg | Freq: Three times a day (TID) | INTRAMUSCULAR | Status: AC
  Administered 2019-12-12 (×2): 15 mg via INTRAVENOUS
  Filled 2019-12-12 (×2): qty 1

## 2019-12-12 MED ORDER — TRAMADOL HCL 50 MG PO TABS
100.0000 mg | ORAL_TABLET | Freq: Four times a day (QID) | ORAL | Status: DC | PRN
  Administered 2019-12-12 – 2019-12-13 (×2): 100 mg via ORAL
  Filled 2019-12-12 (×3): qty 2

## 2019-12-12 MED ORDER — ACETAMINOPHEN 500 MG PO TABS
1000.0000 mg | ORAL_TABLET | Freq: Four times a day (QID) | ORAL | Status: DC
  Administered 2019-12-12 – 2019-12-14 (×8): 1000 mg via ORAL
  Filled 2019-12-12 (×8): qty 2

## 2019-12-12 MED ORDER — OXYCODONE HCL 5 MG/5ML PO SOLN: 5.0000 mg | Freq: Once | ORAL | Status: DC | PRN

## 2019-12-12 MED ORDER — ONDANSETRON HCL 4 MG/2ML IJ SOLN: 4.0000 mg | Freq: Four times a day (QID) | INTRAMUSCULAR | Status: DC | PRN

## 2019-12-12 MED ORDER — ROCURONIUM BROMIDE 10 MG/ML (PF) SYRINGE
PREFILLED_SYRINGE | INTRAVENOUS | Status: DC | PRN
  Administered 2019-12-12: 40 mg via INTRAVENOUS
  Administered 2019-12-12: 60 mg via INTRAVENOUS

## 2019-12-12 MED ORDER — PREDNISONE 20 MG PO TABS
40.0000 mg | ORAL_TABLET | Freq: Every day | ORAL | Status: DC
  Administered 2019-12-13 – 2019-12-14 (×2): 40 mg via ORAL
  Filled 2019-12-12 (×2): qty 2

## 2019-12-12 MED ORDER — BISACODYL 5 MG PO TBEC
10.0000 mg | DELAYED_RELEASE_TABLET | Freq: Every day | ORAL | Status: DC
  Administered 2019-12-12 – 2019-12-14 (×3): 10 mg via ORAL
  Filled 2019-12-12 (×3): qty 2

## 2019-12-12 MED ORDER — PROPOFOL 10 MG/ML IV BOLUS
INTRAVENOUS | Status: DC | PRN
  Administered 2019-12-12: 30 mg via INTRAVENOUS
  Administered 2019-12-12: 150 mg via INTRAVENOUS
  Administered 2019-12-12: 20 mg via INTRAVENOUS
  Administered 2019-12-12: 50 mg via INTRAVENOUS

## 2019-12-12 MED ORDER — PANTOPRAZOLE SODIUM 40 MG PO TBEC
80.0000 mg | DELAYED_RELEASE_TABLET | Freq: Every day | ORAL | Status: DC
  Administered 2019-12-13 – 2019-12-14 (×2): 80 mg via ORAL
  Filled 2019-12-12 (×2): qty 2

## 2019-12-12 MED ORDER — LUNG SURGERY BOOK
Freq: Once | Status: AC
  Filled 2019-12-12: qty 3

## 2019-12-12 MED ORDER — LIDOCAINE 2% (20 MG/ML) 5 ML SYRINGE
INTRAMUSCULAR | Status: DC | PRN
  Administered 2019-12-12: 80 mg via INTRAVENOUS

## 2019-12-12 MED ORDER — MIDAZOLAM HCL 2 MG/2ML IJ SOLN
INTRAMUSCULAR | Status: AC
  Filled 2019-12-12: qty 2

## 2019-12-12 MED ORDER — FENTANYL CITRATE (PF) 250 MCG/5ML IJ SOLN
INTRAMUSCULAR | Status: AC
  Filled 2019-12-12: qty 5

## 2019-12-12 MED ORDER — SODIUM CHLORIDE 0.9 % IR SOLN
Status: DC | PRN
  Administered 2019-12-12: 1000 mL

## 2019-12-12 MED ORDER — DEXAMETHASONE SODIUM PHOSPHATE 10 MG/ML IJ SOLN
INTRAMUSCULAR | Status: AC
  Filled 2019-12-12: qty 1

## 2019-12-12 MED ORDER — PREGABALIN 75 MG PO CAPS
150.0000 mg | ORAL_CAPSULE | Freq: Two times a day (BID) | ORAL | Status: DC
  Administered 2019-12-12 – 2019-12-14 (×4): 150 mg via ORAL
  Filled 2019-12-12 (×4): qty 2

## 2019-12-12 MED ORDER — ROCURONIUM BROMIDE 10 MG/ML (PF) SYRINGE
PREFILLED_SYRINGE | INTRAVENOUS | Status: AC
  Filled 2019-12-12: qty 10

## 2019-12-12 MED ORDER — LEVOTHYROXINE SODIUM 137 MCG PO TABS
137.0000 ug | ORAL_TABLET | Freq: Every day | ORAL | Status: DC
  Administered 2019-12-13 – 2019-12-14 (×2): 137 ug via ORAL
  Filled 2019-12-12 (×2): qty 1

## 2019-12-12 MED ORDER — BUPIVACAINE HCL (PF) 0.5 % IJ SOLN
INTRAMUSCULAR | Status: AC
  Filled 2019-12-12: qty 30

## 2019-12-12 MED ORDER — ONDANSETRON HCL 4 MG/2ML IJ SOLN
INTRAMUSCULAR | Status: AC
  Filled 2019-12-12: qty 2

## 2019-12-12 MED ORDER — ACETAMINOPHEN 160 MG/5ML PO SOLN: 1000.0000 mg | Freq: Four times a day (QID) | ORAL | Status: DC

## 2019-12-12 MED ORDER — ACETAMINOPHEN 500 MG PO TABS
ORAL_TABLET | ORAL | Status: AC
  Administered 2019-12-12: 1000 mg via ORAL
  Filled 2019-12-12: qty 2

## 2019-12-12 MED ORDER — DULOXETINE HCL 60 MG PO CPEP
60.0000 mg | ORAL_CAPSULE | Freq: Every day | ORAL | Status: DC
  Administered 2019-12-13 – 2019-12-14 (×2): 60 mg via ORAL
  Filled 2019-12-12 (×2): qty 1

## 2019-12-12 MED ORDER — SODIUM CHLORIDE FLUSH 0.9 % IV SOLN
INTRAVENOUS | Status: DC | PRN
  Administered 2019-12-12: 09:00:00 100 mL

## 2019-12-12 MED ORDER — 0.9 % SODIUM CHLORIDE (POUR BTL) OPTIME
TOPICAL | Status: DC | PRN
  Administered 2019-12-12: 09:00:00 2000 mL

## 2019-12-12 MED ORDER — SUGAMMADEX SODIUM 200 MG/2ML IV SOLN
INTRAVENOUS | Status: DC | PRN
  Administered 2019-12-12: 400 mg via INTRAVENOUS

## 2019-12-12 MED ORDER — FENTANYL CITRATE (PF) 250 MCG/5ML IJ SOLN
INTRAMUSCULAR | Status: DC | PRN
  Administered 2019-12-12: 25 ug via INTRAVENOUS
  Administered 2019-12-12 (×3): 50 ug via INTRAVENOUS
  Administered 2019-12-12: 25 ug via INTRAVENOUS
  Administered 2019-12-12 (×2): 50 ug via INTRAVENOUS
  Administered 2019-12-12: 100 ug via INTRAVENOUS

## 2019-12-12 MED ORDER — ONDANSETRON HCL 4 MG/2ML IJ SOLN
INTRAMUSCULAR | Status: DC | PRN
  Administered 2019-12-12: 4 mg via INTRAVENOUS

## 2019-12-12 MED ORDER — INSULIN ASPART 100 UNIT/ML ~~LOC~~ SOLN
0.0000 [IU] | Freq: Three times a day (TID) | SUBCUTANEOUS | Status: DC
  Administered 2019-12-12: 8 [IU] via SUBCUTANEOUS
  Administered 2019-12-12: 5 [IU] via SUBCUTANEOUS
  Administered 2019-12-13: 07:00:00 2 [IU] via SUBCUTANEOUS
  Administered 2019-12-13 – 2019-12-14 (×3): 8 [IU] via SUBCUTANEOUS
  Administered 2019-12-14: 3 [IU] via SUBCUTANEOUS

## 2019-12-12 MED ORDER — METFORMIN HCL ER 500 MG PO TB24
1000.0000 mg | ORAL_TABLET | Freq: Every day | ORAL | Status: DC
  Administered 2019-12-12 – 2019-12-13 (×2): 1000 mg via ORAL
  Filled 2019-12-12 (×3): qty 2

## 2019-12-12 MED ORDER — LIDOCAINE 2% (20 MG/ML) 5 ML SYRINGE
INTRAMUSCULAR | Status: AC
  Filled 2019-12-12: qty 5

## 2019-12-12 MED ORDER — CEFAZOLIN SODIUM-DEXTROSE 2-4 GM/100ML-% IV SOLN
2.0000 g | INTRAVENOUS | Status: AC
  Administered 2019-12-12: 2 g via INTRAVENOUS

## 2019-12-12 MED ORDER — MIDAZOLAM HCL 2 MG/2ML IJ SOLN
INTRAMUSCULAR | Status: DC | PRN
  Administered 2019-12-12 (×2): 1 mg via INTRAVENOUS

## 2019-12-12 MED ORDER — PROMETHAZINE HCL 25 MG/ML IJ SOLN: 6.2500 mg | INTRAMUSCULAR | Status: DC | PRN

## 2019-12-12 MED ORDER — ENOXAPARIN SODIUM 40 MG/0.4ML ~~LOC~~ SOLN
40.0000 mg | Freq: Every day | SUBCUTANEOUS | Status: DC
  Administered 2019-12-13 – 2019-12-14 (×2): 40 mg via SUBCUTANEOUS
  Filled 2019-12-12 (×2): qty 0.4

## 2019-12-12 MED ORDER — HYDROMORPHONE HCL 1 MG/ML IJ SOLN
0.2500 mg | INTRAMUSCULAR | Status: DC | PRN
  Administered 2019-12-12: 0.5 mg via INTRAVENOUS

## 2019-12-12 MED ORDER — CEFAZOLIN SODIUM-DEXTROSE 2-4 GM/100ML-% IV SOLN
INTRAVENOUS | Status: AC
  Filled 2019-12-12: qty 100

## 2019-12-12 MED ORDER — CHLORHEXIDINE GLUCONATE CLOTH 2 % EX PADS
6.0000 | MEDICATED_PAD | Freq: Every day | CUTANEOUS | Status: DC
  Administered 2019-12-12 – 2019-12-14 (×2): 6 via TOPICAL

## 2019-12-12 MED ORDER — OXYCODONE HCL 5 MG PO TABS: 5.0000 mg | ORAL_TABLET | Freq: Once | ORAL | Status: DC | PRN

## 2019-12-12 MED ORDER — BUPIVACAINE LIPOSOME 1.3 % IJ SUSP
20.0000 mL | Freq: Once | INTRAMUSCULAR | Status: DC
  Filled 2019-12-12: qty 20

## 2019-12-12 MED ORDER — OXYCODONE HCL 5 MG PO TABS
5.0000 mg | ORAL_TABLET | ORAL | Status: DC | PRN
  Administered 2019-12-12 – 2019-12-14 (×9): 5 mg via ORAL
  Filled 2019-12-12 (×9): qty 1

## 2019-12-12 MED ORDER — MOMETASONE FURO-FORMOTEROL FUM 200-5 MCG/ACT IN AERO
2.0000 | INHALATION_SPRAY | Freq: Two times a day (BID) | RESPIRATORY_TRACT | Status: DC
  Filled 2019-12-12: qty 8.8

## 2019-12-12 MED ORDER — SULFAMETHOXAZOLE-TRIMETHOPRIM 800-160 MG PO TABS
1.0000 | ORAL_TABLET | ORAL | Status: DC
  Administered 2019-12-13: 1 via ORAL
  Filled 2019-12-12: qty 1

## 2019-12-12 MED ORDER — ESMOLOL HCL 100 MG/10ML IV SOLN
INTRAVENOUS | Status: DC | PRN
  Administered 2019-12-12: 20 mg via INTRAVENOUS
  Administered 2019-12-12: 10 mg via INTRAVENOUS
  Administered 2019-12-12: 20 mg via INTRAVENOUS

## 2019-12-12 SURGICAL SUPPLY — 117 items
ADH SKN CLS APL DERMABOND .7 (GAUZE/BANDAGES/DRESSINGS) ×1
ANCHOR CATH FOLEY SECURE (MISCELLANEOUS) ×2 IMPLANT
APL SRG 22X2 LUM MLBL SLNT (VASCULAR PRODUCTS)
APPLICATOR TIP EXT COSEAL (VASCULAR PRODUCTS) IMPLANT
BAG SPEC RTRVL 10 TROC 200 (ENDOMECHANICALS)
BLADE CLIPPER SURG (BLADE) ×2 IMPLANT
BNDG COHESIVE 6X5 TAN STRL LF (GAUZE/BANDAGES/DRESSINGS) ×2 IMPLANT
CANISTER SUCT 3000ML PPV (MISCELLANEOUS) ×4 IMPLANT
CANNULA REDUC XI 12-8 STAPL (CANNULA) ×2
CANNULA REDUCER 12-8 DVNC XI (CANNULA) ×2 IMPLANT
CATH THORACIC 28FR (CATHETERS) IMPLANT
CATH THORACIC 36FR (CATHETERS) IMPLANT
CATH THORACIC 36FR RT ANG (CATHETERS) IMPLANT
CLIP VESOCCLUDE MED 6/CT (CLIP) IMPLANT
CNTNR URN SCR LID CUP LEK RST (MISCELLANEOUS) ×5 IMPLANT
CONN ST 1/4X3/8  BEN (MISCELLANEOUS)
CONN ST 1/4X3/8 BEN (MISCELLANEOUS) IMPLANT
CONN Y 3/8X3/8X3/8  BEN (MISCELLANEOUS)
CONN Y 3/8X3/8X3/8 BEN (MISCELLANEOUS) IMPLANT
CONT SPEC 4OZ STRL OR WHT (MISCELLANEOUS) ×14
COVER SURGICAL LIGHT HANDLE (MISCELLANEOUS) ×2 IMPLANT
DEFOGGER SCOPE WARMER CLEARIFY (MISCELLANEOUS) ×2 IMPLANT
DERMABOND ADVANCED (GAUZE/BANDAGES/DRESSINGS) ×1
DERMABOND ADVANCED .7 DNX12 (GAUZE/BANDAGES/DRESSINGS) ×2 IMPLANT
DISSECTOR BLUNT TIP ENDO 5MM (MISCELLANEOUS) IMPLANT
DRAIN CHANNEL 28F RND 3/8 FF (WOUND CARE) IMPLANT
DRAPE ARM DVNC X/XI (DISPOSABLE) ×4 IMPLANT
DRAPE COLUMN DVNC XI (DISPOSABLE) ×1 IMPLANT
DRAPE CV SPLIT W-CLR ANES SCRN (DRAPES) ×2 IMPLANT
DRAPE DA VINCI XI ARM (DISPOSABLE) ×8
DRAPE DA VINCI XI COLUMN (DISPOSABLE) ×2
DRAPE ORTHO SPLIT 77X108 STRL (DRAPES) ×2
DRAPE SURG ORHT 6 SPLT 77X108 (DRAPES) ×1 IMPLANT
DRAPE WARM FLUID 44X44 (DRAPES) IMPLANT
ELECT BLADE 4.0 EZ CLEAN MEGAD (MISCELLANEOUS)
ELECT REM PT RETURN 9FT ADLT (ELECTROSURGICAL) ×2
ELECTRODE BLDE 4.0 EZ CLN MEGD (MISCELLANEOUS) IMPLANT
ELECTRODE REM PT RTRN 9FT ADLT (ELECTROSURGICAL) ×1 IMPLANT
FILTER SMOKE EVAC ULPA (FILTER) IMPLANT
GAUZE KITTNER 4X5 RF (MISCELLANEOUS) ×1 IMPLANT
GAUZE KITTNER 4X8 (MISCELLANEOUS) ×2 IMPLANT
GAUZE SPONGE 4X4 12PLY STRL (GAUZE/BANDAGES/DRESSINGS) ×2 IMPLANT
GLOVE BIO SURGEON STRL SZ 6.5 (GLOVE) ×5 IMPLANT
GLOVE SURG SS PI 6.0 STRL IVOR (GLOVE) ×1 IMPLANT
GLOVE SURG SS PI 8.0 STRL IVOR (GLOVE) ×1 IMPLANT
GOWN STRL REUS W/ TWL LRG LVL3 (GOWN DISPOSABLE) ×3 IMPLANT
GOWN STRL REUS W/TWL 2XL LVL3 (GOWN DISPOSABLE) ×2 IMPLANT
GOWN STRL REUS W/TWL LRG LVL3 (GOWN DISPOSABLE) ×6
HEMOSTAT SURGICEL 2X14 (HEMOSTASIS) ×6 IMPLANT
IRRIGATION STRYKERFLOW (MISCELLANEOUS) ×1 IMPLANT
IRRIGATOR STRYKERFLOW (MISCELLANEOUS) ×2
KIT BASIN OR (CUSTOM PROCEDURE TRAY) ×2 IMPLANT
KIT TURNOVER KIT B (KITS) ×2 IMPLANT
NS IRRIG 1000ML POUR BTL (IV SOLUTION) ×2 IMPLANT
OBTURATOR OPTICAL STANDARD 8MM (TROCAR)
OBTURATOR OPTICAL STND 8 DVNC (TROCAR)
OBTURATOR OPTICALSTD 8 DVNC (TROCAR) IMPLANT
PACK CHEST (CUSTOM PROCEDURE TRAY) ×2 IMPLANT
PAD ARMBOARD 7.5X6 YLW CONV (MISCELLANEOUS) ×10 IMPLANT
PASSER SUT SWANSON 36MM LOOP (INSTRUMENTS) IMPLANT
PENCIL SMOKE EVACUATOR (MISCELLANEOUS) IMPLANT
POUCH RETRIEVAL ECOSAC 10 (ENDOMECHANICALS) ×1 IMPLANT
POUCH RETRIEVAL ECOSAC 10MM (ENDOMECHANICALS)
RELOAD STAPLE 45 3.5 BLU DVNC (STAPLE) IMPLANT
RELOAD STAPLE 45 4.3 GRN DVNC (STAPLE) IMPLANT
RELOAD STAPLER 3.5X45 BLU DVNC (STAPLE) ×6 IMPLANT
RELOAD STAPLER 4.3X45 GRN DVNC (STAPLE) ×1 IMPLANT
SEAL CANN UNIV 5-8 DVNC XI (MISCELLANEOUS) ×2 IMPLANT
SEAL XI 5MM-8MM UNIVERSAL (MISCELLANEOUS) ×4
SEALANT PROGEL (MISCELLANEOUS) IMPLANT
SEALANT SURG COSEAL 4ML (VASCULAR PRODUCTS) IMPLANT
SEALANT SURG COSEAL 8ML (VASCULAR PRODUCTS) IMPLANT
SEALER LIGASURE MARYLAND 30 (ELECTROSURGICAL) IMPLANT
SET TUBE SMOKE EVAC HIGH FLOW (TUBING) ×2 IMPLANT
SHEET MEDIUM DRAPE 40X70 STRL (DRAPES) ×1 IMPLANT
SLEEVE SUCTION 125 (MISCELLANEOUS) IMPLANT
SOL ANTI FOG 6CC (MISCELLANEOUS) IMPLANT
SOLUTION ANTI FOG 6CC (MISCELLANEOUS)
SOLUTION ELECTROLUBE (MISCELLANEOUS) ×2 IMPLANT
STAPLER 45 DA VINCI SURE FORM (STAPLE) ×2
STAPLER 45 SUREFORM DVNC (STAPLE) IMPLANT
STAPLER CANNULA SEAL DVNC XI (STAPLE) ×2 IMPLANT
STAPLER CANNULA SEAL XI (STAPLE) ×4
STAPLER RELOAD 3.5X45 BLU DVNC (STAPLE) ×6
STAPLER RELOAD 3.5X45 BLUE (STAPLE) ×12
STAPLER RELOAD 4.3X45 GREEN (STAPLE) ×2
STAPLER RELOAD 4.3X45 GRN DVNC (STAPLE) ×1
STOPCOCK 4 WAY LG BORE MALE ST (IV SETS) ×2 IMPLANT
SUT PROLENE 3 0 SH DA (SUTURE) IMPLANT
SUT PROLENE 4 0 RB 1 (SUTURE)
SUT PROLENE 4-0 RB1 .5 CRCL 36 (SUTURE) IMPLANT
SUT SILK  1 MH (SUTURE) ×4
SUT SILK 1 MH (SUTURE) ×2 IMPLANT
SUT SILK 1 TIES 10X30 (SUTURE) IMPLANT
SUT SILK 2 0SH CR/8 30 (SUTURE) IMPLANT
SUT VIC AB 1 CTX 18 (SUTURE) IMPLANT
SUT VIC AB 1 CTX 36 (SUTURE)
SUT VIC AB 1 CTX36XBRD ANBCTR (SUTURE) IMPLANT
SUT VIC AB 2-0 CTX 36 (SUTURE) ×2 IMPLANT
SUT VIC AB 3-0 X1 27 (SUTURE) ×4 IMPLANT
SUT VICRYL 0 TIES 12 18 (SUTURE) ×2 IMPLANT
SUT VICRYL 0 UR6 27IN ABS (SUTURE) ×4 IMPLANT
SUT VICRYL 2 TP 1 (SUTURE) IMPLANT
SYR 20ML LL LF (SYRINGE) ×2 IMPLANT
SYR 50ML LL SCALE MARK (SYRINGE) ×2 IMPLANT
SYR 5ML LL (SYRINGE) ×2 IMPLANT
SYSTEM SAHARA CHEST DRAIN ATS (WOUND CARE) ×2 IMPLANT
TAPE CLOTH 4X10 WHT NS (GAUZE/BANDAGES/DRESSINGS) ×2 IMPLANT
TAPE UMBILICAL COTTON 1/8X30 (MISCELLANEOUS) IMPLANT
TIP APPLICATOR SPRAY EXTEND 16 (VASCULAR PRODUCTS) IMPLANT
TOWEL GREEN STERILE (TOWEL DISPOSABLE) ×4 IMPLANT
TRAP FLUID SMOKE EVACUATOR (MISCELLANEOUS) IMPLANT
TRAY FOLEY MTR SLVR 16FR STAT (SET/KITS/TRAYS/PACK) ×2 IMPLANT
TROCAR XCEL 12X100 BLDLESS (ENDOMECHANICALS) ×2 IMPLANT
TROCAR XCEL BLADELESS 5X75MML (TROCAR) IMPLANT
TUBING EXTENTION W/L.L. (IV SETS) ×2 IMPLANT
WATER STERILE IRR 1000ML POUR (IV SOLUTION) ×2 IMPLANT

## 2019-12-12 NOTE — Transfer of Care (Signed)
Immediate Anesthesia Transfer of Care Note  Patient: [name]  Procedure(s) Performed: XI ROBOTIC ASSISTED THORASCOPY-LUNG BIOPSY of RIGHT UPPER, MIDDLE, AND LOWER LOBES (Right Chest) Intercostal Nerve Block (Right Chest)  Patient Location: PACU  Anesthesia Type:General  Level of Consciousness: awake, alert  and oriented  Airway & Oxygen Therapy: Patient Spontanous Breathing and Patient connected to face mask oxygen  Post-op Assessment: Report given to RN and Post -op Vital signs reviewed and stable  Post vital signs: Reviewed and stable  Last Vitals:  Vitals Value Taken Time  BP 149/95 12/12/19 1011  Temp    Pulse 106 12/12/19 1017  Resp 22 12/12/19 1017  SpO2 98 % 12/12/19 1017  Vitals shown include unvalidated device data.  Last Pain:  Vitals:   12/12/19 0613  TempSrc: Oral  PainSc:       Patients Stated Pain Goal: 3 (12/12/19 0603)  Complications: No apparent anesthesia complications

## 2019-12-12 NOTE — H&P (Signed)
[address]       [place] [address]             [phone]                    Jaziah Kwasnik [facility] Medical Record #[id number] Date of Birth: 09-Mar-1976  Referring: Chilton Greathouse, MD Primary Care: Charlane Ferretti, DO Primary Cardiologist: No primary care provider on file.  Chief Complaint:    Chief Complaint  Patient presents with  . Interstitial Lung Disease    Surgical eval,     History of Present Illness:    [name] 44 y.o. female was  seen in the office   for evaluation of open lung biopsy.  She notes at least a 5-month history of progressive dry unproductive cough with increasing shortness of breath.  In the fall 2020 a CT scan was done to rule out pulmonary embolus, this study ruled out embolus bilateral pneumonic process.  She was treated with inhalers and antibiotics for several minutes but without improvement ultimately she was referred to Dr. Lonie Peak service.  High-definition CT scans bronchoscopy with washings suggested hypersensitivity pneumonitis-she has been started on steroids with intermittent improvement.  She notes that when the dose has been decreased her symptoms have worsened.   During the progress of the evaluation of her interstitial lung disease she notes that on January 13 of this year she was diagnosed with Covid infection.   She is now referred after follow-up definition CT scan to consider lung biopsy.   She notes that over this time she has been careful about environmental exposures-she is never a smoker, notes it is installed air filters in her house.  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  She denies hypertension hyperlipidemia no history of previous stroke peripheral vascular disease renal insufficiency, she is not known to be diabetic but recently with steroids has been started on Metformin.  Patient did have pulmonary function  studies done [facility] several weeks ago-no response to bronchodilator therapy.  Current Activity/ Functional Status:  Patient is independent with mobility/ambulation, transfers, ADL's, IADL's.   Zubrod Score: At the time of surgery this patient's most appropriate activity status/level should be described as: []     0    Normal activity, no symptoms [x]     1    Restricted in physical strenuous activity but ambulatory, able to do out light work []     2    Ambulatory and capable of self care, unable to do work activities, up and about               >50 % of waking hours                              []     3    Only limited self care, in bed greater than 50% of waking hours []     4    Completely disabled, no self care, confined to bed or chair []     5    Moribund   Past Medical History:  Diagnosis Date  . Anxiety   . Arthritis   . Depression   . Diabetes mellitus without complication (HCC)   . Dyspnea   . Fibromyalgia   . GERD (gastroesophageal reflux disease)   . Headache    migraines  . Hypothyroidism  1999   post PTU Rx for hyperthyroidism  . IBS (irritable bowel syndrome)   . Interstitial lung disease (HCC)   . Pneumonia 06/2019   Double PNA    Past Surgical History:  Procedure Laterality Date  . CESAREAN SECTION     x 2  . VIDEO BRONCHOSCOPY Bilateral 07/12/2019   Procedure: VIDEO BRONCHOSCOPY WITH FLUORO;  Surgeon: Chilton Greathouse, MD;  Location: [facility] ENDOSCOPY;  Service: Cardiopulmonary;  Laterality: Bilateral;    Family History  Problem Relation Age of Onset  . Diabetes Paternal Grandmother   . Hyperlipidemia Mother   . Diabetes Father   . Cancer Paternal Grandfather        lung  . ADD / ADHD Son   . Other Neg Hx        PCO   She notes that her paternal grandfather died of lung cancer maternal grandmother died of leukemia.  Her parents are alive father has diabetes mother has hypothyroidism  Social History   Tobacco Use  Smoking Status Never Smoker    Smokeless Tobacco Never Used    Social History   Substance and Sexual Activity  Alcohol Use Yes   Comment: occasional     Allergies  Allergen Reactions  . Methimazole Other (See Comments)    Body goes into arthritic shock  . Bupropion     Body aches / headaches     Current Facility-Administered Medications  Medication Dose Route Frequency Provider Last Rate Last Admin  . ceFAZolin (ANCEF) 2-4 GM/100ML-% IVPB           . ceFAZolin (ANCEF) IVPB 2g/100 mL premix  2 g Intravenous 30 min Pre-Op Delight Ovens, MD        Pertinent items are noted in HPI.   Review of Systems:     Cardiac Review of Systems: [Y] = yes  or   [ N ] = no   Chest Pain [  n  ]  Resting SOB [ y  ] Exertional SOB  y[  ]  Orthopnea [n  ]   Pedal Edema [ n  ]    Palpitations [n  ] Syncope  [ n ]   Presyncope [ n  ]   General Review of Systems: [Y] = yes [  ]=no Constitional: recent weight change [  ];  Wt loss over the last 3 months [   ] anorexia [  ]; fatigue [  ]; nausea [  ]; night sweats [  ]; fever [  ]; or chills [  ];           Eye : blurred vision [  ]; diplopia [   ]; vision changes [  ];  Amaurosis fugax[  ]; Resp: cough [  ];  wheezing[  ];  hemoptysis[  ]; shortness of breath[  ]; paroxysmal nocturnal dyspnea[  ]; dyspnea on exertion[  ]; or orthopnea[  ];  GI:  gallstones[  ], vomiting[  ];  dysphagia[  ]; melena[  ];  hematochezia [  ]; heartburn[  ];   Hx of  Colonoscopy[  ]; GU: kidney stones [  ]; hematuria[  ];   dysuria [  ];  nocturia[  ];  history of     obstruction [  ]; urinary frequency [  ]             Skin: rash, swelling[  ];, hair loss[  ];  peripheral edema[  ];  or itching[  ];  Musculosketetal: myalgias[  ];  joint swelling[  ];  joint erythema[  ];  joint pain[  ];  back pain[  ];  Heme/Lymph: bruising[  ];  bleeding[  ];  anemia[  ];  Neuro: TIA[  ];  headaches[  ];  stroke[  ];  vertigo[  ];  seizures[  ];   paresthesias[  ];  difficulty walking[  ];  Psych:depression[   ]; anxiety[  ];  Endocrine: diabetes[  ];  thyroid dysfunction[  ];  Immunizations: Flu up to date [  ]; Pneumococcal up to date [  ];  Other:     PHYSICAL EXAMINATION: BP (!) 157/96   Pulse 86   Temp 97.9 F (36.6 C) (Oral)   Resp 18   Ht 5\' 6"  (1.676 m)   Wt 88.5 kg   LMP 11/30/2019   SpO2 96%   BMI 31.47 kg/m  General appearance: alert, cooperative and no distress Head: Normocephalic, without obvious abnormality, atraumatic Neck: no adenopathy, no carotid bruit, no JVD, supple, symmetrical, trachea midline and thyroid not enlarged, symmetric, no tenderness/mass/nodules Resp: clear to auscultation bilaterally Cardio: regular rate and rhythm, S1, S2 normal, no murmur, click, rub or gallop GI: soft, non-tender; bowel sounds normal; no masses,  no organomegaly Extremities: extremities normal, atraumatic, no cyanosis or edema and Homans sign is negative, no sign of DVT Neurologic: Grossly normal  Diagnostic Studies & Laboratory data:     Recent Radiology Findings:   DG Chest 2 View  Result Date: 12/12/2019 CLINICAL DATA:  44 year old female preoperative study for lung biopsy. EXAM: CHEST - 2 VIEW COMPARISON:  Radiographs 12/02/2019 and earlier. FINDINGS: Thoracolumbar scoliosis again noted. Stable lung volumes. Mediastinal contours remain normal. Visualized tracheal air column is within normal limits. Coarse bilateral pulmonary interstitial opacity is stable with no pneumothorax, pleural effusion or confluent opacity. Paucity of bowel gas in the upper abdomen. No acute osseous abnormality identified. IMPRESSION: Stable diffuse increased pulmonary interstitial markings. No new cardiopulmonary abnormality. Electronically Signed   By: 02/01/2020 M.D.   On: 12/12/2019 06:46   DG Chest 2 View  Result Date: 12/02/2019 CLINICAL DATA:  Dyspnea EXAM: CHEST - 2 VIEW COMPARISON:  11/02/2018 FINDINGS: Cardiac shadows within normal limits. Mild fibrotic changes are noted within both lung similar  to that seen on recent CT. No focal infiltrate or sizable effusion is noted. No acute bony abnormality is seen. Mild S-shaped scoliosis of the thoracolumbar spine is noted. IMPRESSION: No acute abnormality Electronically Signed   By: 01/02/2019 M.D.   On: 12/02/2019 08:51    CLINICAL DATA:  Interstitial lung disease. COVID in January. Worsening symptoms.  EXAM: CT CHEST WITHOUT CONTRAST  TECHNIQUE: Multidetector CT imaging of the chest was performed following the standard protocol without intravenous contrast. High resolution imaging of the lungs, as well as inspiratory and expiratory imaging, was performed.  COMPARISON:  07/01/2019 and 06/15/2019.  FINDINGS: Cardiovascular: Heart size normal.  No pericardial effusion.  Mediastinum/Nodes: Low right paratracheal lymph node measures 1.3 cm but has a fatty hilum and is unchanged from 07/01/2019. No pathologically enlarged mediastinal or axillary lymph nodes. Hilar regions are difficult to definitively evaluate without IV contrast. Esophagus is grossly unremarkable.  Lungs/Pleura: Subpleural reticulation and ground-glass with traction bronchiectasis/bronchiolectasis and without a definite zonal predominance. If anything, there is sparing of the costophrenic angles. Findings appears slightly more organized than on 07/01/2019 and certainly more so than on 06/15/2019. Possible single layer honeycombing. No pleural fluid. Airway is unremarkable. There may  be minimal air trapping.  Upper Abdomen: Visualized portions of the liver, gallbladder, adrenal glands, kidneys, spleen, pancreas, stomach and bowel are grossly unremarkable.  Musculoskeletal: Degenerative changes in the spine. No worrisome lytic or sclerotic lesions.  IMPRESSION: Pulmonary parenchymal pattern of fibrosis appears slightly more organized than on 71/01/2693 and certainly from 85/46/2703. Findings may be due to nonspecific interstitial pneumonitis.  Findings are indeterminate for UIP per consensus guidelines: Diagnosis of Idiopathic Pulmonary Fibrosis: An Official ATS/ERS/JRS/ALAT Clinical Practice Guideline. [place], Iss 5, [phone], Apr 25 2017.   Electronically Signed   By: Lorin Picket M.D.   On: 11/02/2019 13:38 I have independently reviewed the above radiology studies  and reviewed the findings with the patient.   Recent Lab Findings: Lab Results  Component Value Date   WBC 10.4 12/08/2019   HGB 13.6 12/08/2019   HCT 42.7 12/08/2019   PLT 313 12/08/2019   GLUCOSE 137 (H) 12/08/2019   CHOL 184 06/23/2018   TRIG 136.0 06/23/2018   HDL 71.00 06/23/2018   LDLCALC 86 06/23/2018   ALT 21 12/08/2019   AST 28 12/08/2019   NA 141 12/08/2019   K 3.4 (L) 12/08/2019   CL 100 12/08/2019   CREATININE 0.87 12/08/2019   BUN 14 12/08/2019   CO2 22 12/08/2019   TSH 1.78 05/25/2019   INR 0.8 12/08/2019   HGBA1C 8.6 (H) 12/02/2019      Assessment / Plan:   #1 interstitial lung disease-previous examination with bronchoscopy suggested hypersensitivity pneumonitis-with progression of symptoms and dependence on steroids patient is referred for open lung biopsy.  This is a reasonable next step in her evaluation.  I discussed pros and cons of biopsy with her and her husband.  We discussed proceeding with robotic assisted lung biopsies-each side appears relatively similar into the degree of radiographic changes-I have recommended  proceeding with right lung biopsy.   The goals risks and alternatives of the planned surgical procedure Procedure(s): XI ROBOTIC ASSISTED THORASCOPY-LUNG BIOPSY (Right)  have been discussed with the patient in detail. The risks of the procedure including death, infection, stroke, myocardial infarction, bleeding, blood transfusion have all been discussed specifically.  I have quoted [name] a 1 % of perioperative mortality and a complication rate as high as 10 %. The patient's  questions have been answered.Rudine Rieger is willing  to proceed with the planned procedure.     Grace Isaac MD      [address] [place] [address] Office [phone]     12/12/2019 7:02 AM

## 2019-12-12 NOTE — Procedures (Signed)
Per request of patient, [name] will changed to prn per her home routine.

## 2019-12-12 NOTE — Anesthesia Procedure Notes (Signed)
Procedure Name: Intubation Date/Time: 12/12/2019 7:46 AM Performed by: Clearnce Sorrel, CRNA Pre-anesthesia Checklist: Patient identified, Emergency Drugs available, Suction available and Patient being monitored Patient Re-evaluated:Patient Re-evaluated prior to induction Oxygen Delivery Method: Circle system utilized Preoxygenation: Pre-oxygenation with 100% oxygen Induction Type: IV induction Ventilation: Mask ventilation without difficulty Laryngoscope Size: Mac and 3 Grade View: Grade II Endobronchial tube: Left and Double lumen EBT and 37 Fr Number of attempts: 1 Airway Equipment and Method: Stylet Placement Confirmation: ETT inserted through vocal cords under direct vision,  positive ETCO2,  breath sounds checked- equal and bilateral and CO2 detector Secured at: 27 cm Tube secured with: Tape Dental Injury: Teeth and Oropharynx as per pre-operative assessment  Comments: Intubation performed by Georgiann Mccoy, SRNA

## 2019-12-12 NOTE — Plan of Care (Signed)
  Problem: Education: Goal: Knowledge of General Education information will improve Description: Including pain rating scale, medication(s)/side effects and non-pharmacologic comfort measures Outcome: Progressing   Problem: Health Behavior/Discharge Planning: Goal: Ability to manage health-related needs will improve Outcome: Progressing   Problem: Clinical Measurements: Goal: Ability to maintain clinical measurements within normal limits will improve Outcome: Progressing   Problem: Activity: Goal: Risk for activity intolerance will decrease Outcome: Progressing   Problem: Nutrition: Goal: Adequate nutrition will be maintained Outcome: Progressing   Problem: Coping: Goal: Level of anxiety will decrease Outcome: Progressing   Problem: Elimination: Goal: Will not experience complications related to bowel motility Outcome: Progressing Goal: Will not experience complications related to urinary retention Outcome: Progressing   Problem: Pain Managment: Goal: General experience of comfort will improve Outcome: Progressing  PO pain medication given Problem: Safety: Goal: Ability to remain free from injury will improve Outcome: Progressing   Problem: Skin Integrity: Goal: Risk for impaired skin integrity will decrease Outcome: Progressing

## 2019-12-12 NOTE — Anesthesia Postprocedure Evaluation (Signed)
Anesthesia Post Note  Patient: Manaia Samad  Procedure(s) Performed: XI ROBOTIC ASSISTED THORASCOPY-LUNG BIOPSY of RIGHT UPPER, MIDDLE, AND LOWER LOBES (Right Chest) Intercostal Nerve Block (Right Chest)     Patient location during evaluation: PACU Anesthesia Type: General Level of consciousness: awake and alert Pain management: pain level controlled Vital Signs Assessment: post-procedure vital signs reviewed and stable Respiratory status: spontaneous breathing, nonlabored ventilation, respiratory function stable and patient connected to nasal cannula oxygen Cardiovascular status: blood pressure returned to baseline and stable Postop Assessment: no apparent nausea or vomiting Anesthetic complications: no    Last Vitals:  Vitals:   12/12/19 1230 12/12/19 1245  BP: 120/80 128/90  Pulse: 92 88  Resp: 17 12  Temp:    SpO2: 100% 99%    Last Pain:  Vitals:   12/12/19 1245  TempSrc:   PainSc: 0-No pain                 Shyanne Mcclary P Trayton Szabo

## 2019-12-12 NOTE — Brief Op Note (Signed)
      [address]       [place] [address]             [phone]      12/12/2019  9:54 AM  PATIENT:  [name]  44 y.o. female  PRE-OPERATIVE DIAGNOSIS:  ILD  POST-OPERATIVE DIAGNOSIS:  ILD  PROCEDURE:  Procedure(s): XI ROBOTIC ASSISTED THORASCOPY-LUNG BIOPSY of RIGHT UPPER, MIDDLE, AND LOWER LOBES (Right) Intercostal Nerve Block (Right)  SURGEON:  Surgeon(s) and Role:    * Delight Ovens, MD - Primary    * Lightfoot, Eliezer Lofts, MD - Assisting  PHYSICIAN ASSISTANT: Boris Lown, PA   ANESTHESIA:   general  EBL: minimal   BLOOD ADMINISTERED:none  DRAINS: none   LOCAL MEDICATIONS USED:  BUPIVICAINE   SPECIMEN:  Source of Specimen:  right upper , middle and lower lobe   DISPOSITION OF SPECIMEN:  and micro  COUNTS:  YES  DICTATION: .Dragon Dictation  PLAN OF CARE: Admit to inpatient   PATIENT DISPOSITION:  PACU - hemodynamically stable.   Delay start of Pharmacological VTE agent (>24hrs) due to surgical blood loss or risk of bleeding: yes

## 2019-12-13 ENCOUNTER — Inpatient Hospital Stay (HOSPITAL_COMMUNITY): Payer: 59

## 2019-12-13 LAB — CBC
HCT: 35.8 % — ABNORMAL LOW (ref 36.0–46.0)
Hemoglobin: 11.4 g/dL — ABNORMAL LOW (ref 12.0–15.0)
MCH: 28.6 pg (ref 26.0–34.0)
MCHC: 31.8 g/dL (ref 30.0–36.0)
MCV: 89.7 fL (ref 80.0–100.0)
Platelets: 241 10*3/uL (ref 150–400)
RBC: 3.99 MIL/uL (ref 3.87–5.11)
RDW: 14 % (ref 11.5–15.5)
WBC: 9.6 10*3/uL (ref 4.0–10.5)
nRBC: 0 % (ref 0.0–0.2)

## 2019-12-13 LAB — BASIC METABOLIC PANEL
Anion gap: 8 (ref 5–15)
BUN: 9 mg/dL (ref 6–20)
CO2: 29 mmol/L (ref 22–32)
Calcium: 8.5 mg/dL — ABNORMAL LOW (ref 8.9–10.3)
Chloride: 99 mmol/L (ref 98–111)
Creatinine, Ser: 0.82 mg/dL (ref 0.44–1.00)
GFR calc Af Amer: >60 mL/min (ref >60)
GFR calc non Af Amer: >60 mL/min (ref >60)
Glucose, Bld: 197 mg/dL — ABNORMAL HIGH (ref 70–99)
Potassium: 4.1 mmol/L (ref 3.5–5.1)
Sodium: 136 mmol/L (ref 135–145)

## 2019-12-13 LAB — GLUCOSE, CAPILLARY
Glucose-Capillary: 124 mg/dL — ABNORMAL HIGH (ref 70–99)
Glucose-Capillary: 256 mg/dL — ABNORMAL HIGH (ref 70–99)
Glucose-Capillary: 266 mg/dL — ABNORMAL HIGH (ref 70–99)
Glucose-Capillary: 228 mg/dL — ABNORMAL HIGH (ref 70–99)

## 2019-12-13 LAB — ACID FAST SMEAR (AFB, MYCOBACTERIA): Acid Fast Smear: NEGATIVE

## 2019-12-13 MED ORDER — KETOROLAC TROMETHAMINE 15 MG/ML IJ SOLN
15.0000 mg | Freq: Four times a day (QID) | INTRAMUSCULAR | Status: AC | PRN
  Administered 2019-12-13 (×2): 15 mg via INTRAVENOUS
  Filled 2019-12-13 (×2): qty 1

## 2019-12-13 MED ORDER — MOMETASONE FURO-FORMOTEROL FUM 200-5 MCG/ACT IN AERO: 2.0000 | INHALATION_SPRAY | Freq: Two times a day (BID) | RESPIRATORY_TRACT | Status: DC | PRN

## 2019-12-13 NOTE — Progress Notes (Addendum)
      [address]       [place] [address]             [phone]      1 Day Post-Op Procedure(s) (LRB): XI ROBOTIC ASSISTED THORASCOPY-LUNG BIOPSY of RIGHT UPPER, MIDDLE, AND LOWER LOBES (Right) Intercostal Nerve Block (Right) Subjective: Feels okay but a little sore on her right side. She states it is hard to take a deep breath.   Objective: Vital signs in last 24 hours: Temp:  [97.6 F (36.4 C)-98.1 F (36.7 C)] 97.7 F (36.5 C) (04/20 0413) Pulse Rate:  [76-104] 80 (04/20 0413) Cardiac Rhythm: Normal sinus rhythm (04/20 0429) Resp:  [12-20] 17 (04/20 0413) BP: (105-152)/(73-97) 107/79 (04/20 0413) SpO2:  [94 %-100 %] 98 % (04/20 0413) Arterial Line BP: (143-167)/(75-94) 152/82 (04/19 1230)     Intake/Output from previous day: 04/19 0701 - 04/20 0700 In: 2180 [P.O.:1080; I.V.:1100] Out: 3240 [Urine:3065; Blood:25; Chest Tube:150] Intake/Output this shift: No intake/output data recorded.  General appearance: alert, cooperative and no distress Heart: regular rate and rhythm, S1, S2 normal, no murmur, click, rub or gallop Lungs: clear to auscultation bilaterally and diminished in the lower lobes Abdomen: soft, non-tender; bowel sounds normal; no masses,  no organomegaly Extremities: extremities normal, atraumatic, no cyanosis or edema Wound: clean and dry  Lab Results: Recent Labs    12/13/19 0252  WBC 9.6  HGB 11.4*  HCT 35.8*  PLT 241   BMET:  Recent Labs    12/13/19 0252  NA 136  K 4.1  CL 99  CO2 29  GLUCOSE 197*  BUN 9  CREATININE 0.82  CALCIUM 8.5*    PT/INR: No results for input(s): LABPROT, INR in the last 72 hours. ABG    Component Value Date/Time   PHART 7.513 (H) 12/08/2019 1012   HCO3 27.1 12/08/2019 1012   O2SAT 96.5 12/08/2019 1012   CBG (last 3)  Recent Labs    12/12/19 1649 12/12/19 2134 12/13/19 0645  GLUCAP 207* 254* 124*    Assessment/Plan: S/P Procedure(s) (LRB): XI ROBOTIC ASSISTED  THORASCOPY-LUNG BIOPSY of RIGHT UPPER, MIDDLE, AND LOWER LOBES (Right) Intercostal Nerve Block (Right)  1. CV-NSR in the 80s, BP well controlled.  2. Tolerating room air with good oxygenation. Ordered an IS for her to use hourly. 3. Renal-creatinine 0.82, electrolytes okay 4. Endo-elevated initially after surgery but now okay.  5. H and H 11.4/35.8, expected acute blood loss anemia.  6. Pain control-continue Toradol for pain control.   Plan: Review chest xray when its available. CXR in the morning. May be able to remove chest tube tomorrow. OOB to chair. Encouraged incentive spirometer. Hopefully home soon.    LOS: 1 day    Sharlene Dory 12/13/2019  Chest tube to water seal this am before xray, poss removal and home tomorrow  I have seen and examined [name] and agree with the above assessment  and plan.  Delight Ovens MD Beeper [phone] Office [phone] 12/13/2019 4:31 PM

## 2019-12-13 NOTE — Plan of Care (Signed)
  Problem: Education: Goal: Knowledge of General Education information will improve Description: Including pain rating scale, medication(s)/side effects and non-pharmacologic comfort measures 12/13/2019 0752 by Luna Kitchens, RN Outcome: Progressing 12/13/2019 0752 by Luna Kitchens, RN Outcome: Progressing   Problem: Health Behavior/Discharge Planning: Goal: Ability to manage health-related needs will improve 12/13/2019 0752 by Luna Kitchens, RN Outcome: Progressing 12/13/2019 0752 by Luna Kitchens, RN Outcome: Progressing   Problem: Clinical Measurements: Goal: Ability to maintain clinical measurements within normal limits will improve 12/13/2019 0752 by Luna Kitchens, RN Outcome: Progressing 12/13/2019 0752 by Luna Kitchens, RN Outcome: Progressing Goal: Will remain free from infection 12/13/2019 0752 by Luna Kitchens, RN Outcome: Progressing 12/13/2019 0752 by Luna Kitchens, RN Outcome: Progressing Goal: Diagnostic test results will improve 12/13/2019 0752 by Luna Kitchens, RN Outcome: Progressing 12/13/2019 0752 by Luna Kitchens, RN Outcome: Progressing Goal: Respiratory complications will improve 12/13/2019 0752 by Luna Kitchens, RN Outcome: Progressing 12/13/2019 0752 by Luna Kitchens, RN Outcome: Progressing Goal: Cardiovascular complication will be avoided 12/13/2019 0752 by Luna Kitchens, RN Outcome: Progressing 12/13/2019 0752 by Luna Kitchens, RN Outcome: Progressing   Problem: Activity: Goal: Risk for activity intolerance will decrease 12/13/2019 0752 by Luna Kitchens, RN Outcome: Progressing 12/13/2019 0752 by Luna Kitchens, RN Outcome: Progressing   Problem: Nutrition: Goal: Adequate nutrition will be maintained 12/13/2019 0752 by Luna Kitchens, RN Outcome: Progressing 12/13/2019 0752 by Luna Kitchens, RN Outcome: Progressing   Problem: Coping: Goal: Level of anxiety will decrease 12/13/2019 0752 by Luna Kitchens, RN Outcome: Progressing 12/13/2019 0752 by Luna Kitchens, RN Outcome: Progressing   Problem: Elimination: Goal: Will not experience complications related to bowel motility 12/13/2019 0752 by Luna Kitchens, RN Outcome: Progressing 12/13/2019 0752 by Luna Kitchens, RN Outcome: Progressing Goal: Will not experience complications related to urinary retention 12/13/2019 0752 by Luna Kitchens, RN Outcome: Progressing 12/13/2019 0752 by Luna Kitchens, RN Outcome: Progressing   Problem: Pain Managment: Goal: General experience of comfort will improve 12/13/2019 0752 by Luna Kitchens, RN Outcome: Progressing 12/13/2019 0752 by Luna Kitchens, RN Outcome: Progressing   Problem: Safety: Goal: Ability to remain free from injury will improve 12/13/2019 0752 by Luna Kitchens, RN Outcome: Progressing 12/13/2019 0752 by Luna Kitchens, RN Outcome: Progressing   Problem: Skin Integrity: Goal: Risk for impaired skin integrity will decrease 12/13/2019 0752 by Luna Kitchens, RN Outcome: Progressing 12/13/2019 0752 by Luna Kitchens, RN Outcome: Progressing   Problem: Education: Goal: Knowledge of disease or condition will improve Outcome: Progressing Goal: Knowledge of the prescribed therapeutic regimen will improve Outcome: Progressing   Problem: Activity: Goal: Risk for activity intolerance will decrease Outcome: Progressing   Problem: Cardiac: Goal: Will achieve and/or maintain hemodynamic stability Outcome: Progressing   Problem: Clinical Measurements: Goal: Postoperative complications will be avoided or minimized Outcome: Progressing

## 2019-12-13 NOTE — Op Note (Signed)
[name], [name] MEDICAL RECORD [id number] ACCOUNT [id number] DATE OF BIRTH:12-29-1975 FACILITY: [facility] LOCATION: [facility] PHYSICIAN:Chaneka Trefz Bari Firmin Belisle, MD  OPERATIVE REPORT  DATE OF PROCEDURE:  12/12/2019  PREOPERATIVE DIAGNOSIS:  Question of interstitial lung disease.  POSTOPERATIVE DIAGNOSIS:  Question of interstitial lung disease.  SURGICAL PROCEDURE:  Robotic-assisted right lung resection x3, right upper lobe, right middle lobe, right lower lobe, and intercostal nerve block.  SURGEON:  Sheliah Plane, MD  FIRST ASSISTANT:  Dr. Cliffton Asters   SECOND ASSISTANT:  Jari Favre, PA  BRIEF HISTORY:  The patient is a 44 year old female who has been followed by pulmonary for progressive shortness of breath and changes on high density CT scan suggestive of interstitial lung disease.  The patient was referred by Dr. Bettey Costa for  consideration of lung biopsy for pathologic confirmation of her underlying pulmonary process.  Risks and options were discussed with the patient in detail and she was agreeable with proceeding.  DESCRIPTION OF PROCEDURE:  The patient underwent general endotracheal anesthesia with a double-lumen endotracheal tube without incident.  Appropriate timeout was performed through the double lumen endotracheal tube.  Fiberoptic bronchoscope was passed  down the right and left tracheobronchial tree without endobronchial lesions and confirming proper placement of the double lumen endotracheal tube.  The scope was then removed.  The patient was turned in lateral decubitus position with the right side up.   Right chest was prepped with Betadine, draped in a sterile manner.  A 2nd timeout was performed confirming laterality.  We then proceeded with placement of 3 port sites, 1 approximately posterior axillary line 7th intercostal space.  Localized Exparel  was infiltrated.  The right chest was entered.  Scope confirmed presence in the chest.  Insufflation was begun and the 8 mm  robotic port was positioned.  With this initial port in place, we then removed approximately 4-5 cm posteriorly and in a similar  fashion under direct vision, placed a 2nd 8 mm port.  With this port in place the scope was then moved in a more anterior port 12 mm in size was placed.  With the 3 ports positioned the middle port was used for the camera.  The [employer] robot was moved  into position.  Camera arm was docked and targeting carried out.  We then placed a tip-up grasper in the anterior port and a Cartier in the posterior port.  We then proceeded with the robotic portion of the procedure.  Through the console the pleural  space was explored visually.  There was complete fissures throughout the lung, had slight cobblestoning appearance.  Based on the visual findings and CT findings we then proceeded with wedge resection first of the right upper lobe with a blue 40 mm  stapler.  The specimen was brought out through the 12 mm port without difficulty.  A portion of the specimen was cut and sent for cultures.  In a similar fashion wedge resections of the middle lobe and right lower lobe were carried out.  With the  operative field hemostatic and suitable specimens obtained the robot was then undocked.  We then did an intercostal nerve block along the posterior rib interspaces under direct vision from the anterior surface of the chest.  A 28 Blake drain was placed  through the anterior port site.  The remaining 2 port sites were closed with a deep UR-6 suture and a running 3-0 subcuticular stitch.  Dermabond was applied.  The lung was reinflated.  Sponge, needle count was reported as  correct at completion of  procedure.  Blood loss was minimal.  The patient was awakened and extubated in the operating room and then transferred to the recovery room having tolerated the procedure without obvious complication.  CN/NUANCE  D:12/12/2019 T:12/13/2019 JOB:[id number]

## 2019-12-13 NOTE — Plan of Care (Signed)

## 2019-12-13 NOTE — Discharge Summary (Signed)
Physician Discharge Summary        [address]       [place] [address]             [phone]   Patient ID: [name] MRN: [id number] DOB/AGE: 1976/02/06 44 y.o.  Admit date: 12/12/2019 Discharge date: 12/14/2019  Admission Diagnoses:  Patient Active Problem List   Diagnosis Date Noted  . SOB (shortness of breath) 09/12/2019  . Dehydration 09/12/2019  . COVID-19 virus infection 09/12/2019  . ILD (interstitial lung disease) (HCC)   . Edema 03/25/2019  . Skin lesions 03/25/2019  . 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 Poor concentration 10/15/2015  . Headache 06/20/2015  . PCO (polycystic ovaries) 03/02/2015  . Acne 01/31/2015  . Allergic rhinitis 01/05/2015  . High risk medications (not anticoagulants) long-term use 10/26/2013  . General medical examination 05/20/2011  . Foot pain, right 07/15/2010  . KNEE PAIN, LEFT 05/17/2010  . Depression with anxiety 04/19/2008  . B12 DEFICIENCY 03/17/2008  . Fatigue 02/14/2008  . Hypothyroidism 07/26/2007  . Fibromyalgia 07/26/2007  . IRRITABLE BOWEL SYNDROME, HX OF 07/26/2007    Discharge Diagnoses:  Active Problems:   ILD (interstitial lung disease) (HCC)   Discharged Condition: good  HPI:   Sarahlynn Cisnero 44 y.o. female was  seen in the office   for evaluation of open lung biopsy.  She notes at least a 98-month history of progressive dry unproductive cough with increasing shortness of breath.  In the fall 2020 a CT scan was done to rule out pulmonary embolus, this study ruled out embolus bilateral pneumonic process.  She was treated with inhalers and antibiotics for several minutes but without improvement ultimately she was referred to Dr. Lonie Peak service.  High-definition CT scans bronchoscopy with washings suggested hypersensitivity pneumonitis-she has been started on steroids with intermittent improvement.  She notes that when the dose has been decreased her symptoms have  worsened.   During the progress of the evaluation of her interstitial lung disease she notes that on January 13 of this year she was diagnosed with Covid infection.   She is now referred after follow-up definition CT scan to consider lung biopsy.   She notes that over this time she has been careful about environmental exposures-she is never a smoker, notes it is installed air filters in her house.  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  She denies hypertension hyperlipidemia no history of previous stroke peripheral vascular disease renal insufficiency, she is not known to be diabetic but recently with steroids has been started on Metformin.  Patient did have pulmonary function studies done [facility] several weeks ago-no response to bronchodilator therapy.  Hospital Course:   On 12/12/2019 Ms. Gasca underwent a robot-assisted uroscopy with a lung biopsy of the right upper, middle, and lower lobes with Dr. Tyrone Sage.  She tolerated the procedure well and was transferred to the telemetry unit for continued care.  She was extubated in the OR.  Postop day 1 her biggest complaint was incisional pain.  She was tolerating room air with excellent oxygenation.  We worked on mobility.  Her chest x-ray was stable.  We continued Toradol for her comfort. Her CXR was stable therefore we were able to remove her chest tube on 4/21. She was sent down for a PA and lateral film. This was reviewed and was stable. Report listed below.  She was deemed appropriate for discharge  at this time. She is to take her prescribed metformin and had a way to monitor her blood sugar at home.   Consults: None  Significant Diagnostic Studies:  CLINICAL DATA:  Chest tube removal.  EXAM: CHEST - 2 VIEW  COMPARISON:  12/14/2019  FINDINGS: Interval removal of the right chest tube. No significant pneumothorax identified. Heart size normal. No  pleural effusion identified. Chronic, coarsened interstitial markings are again identified bilaterally.  IMPRESSION: No pneumothorax after right chest tube removal.   Electronically Signed   By: Kerby Moors M.D.   On: 12/14/2019 10:55  Treatments:   NAME: AIYLA, BAUCOM MEDICAL RECORD [id number] ACCOUNT [id number] DATE OF BIRTH:April 23, 1976 FACILITY: [facility] LOCATION: [place], [place]  OPERATIVE REPORT  DATE OF PROCEDURE:  12/12/2019  PREOPERATIVE DIAGNOSIS:  Question of interstitial lung disease.  POSTOPERATIVE DIAGNOSIS:  Question of interstitial lung disease.  SURGICAL PROCEDURE:  Robotic-assisted right lung resection x3, right upper lobe, right middle lobe, right lower lobe, and intercostal nerve block.  SURGEON:  Lanelle Bal, MD  FIRST ASSISTANT:  Dr. Kipp Brood   SECOND ASSISTANT:  Nicholes Rough, PA  BRIEF HISTORY:  The patient is a 44 year old female who has been followed by pulmonary for progressive shortness of breath and changes on high density CT scan suggestive of interstitial lung disease.  The patient was referred by Dr. Sheran Spine for  consideration of lung biopsy for pathologic confirmation of her underlying pulmonary process.  Risks and options were discussed with the patient in detail and she was agreeable with proceeding.   Discharge Exam: Blood pressure 132/87, pulse 98, temperature 98.1 F (36.7 C), temperature source Oral, resp. rate 19, height 5\' 6"  (1.676 m), weight 88.5 kg, last menstrual period 12/05/2019, SpO2 98 %.   General appearance: alert, cooperative and no distress Heart: regular rate and rhythm, S1, S2 normal, no murmur, click, rub or gallop Lungs: clear to auscultation bilaterally Abdomen: soft, non-tender; bowel sounds normal; no masses,  no organomegaly Extremities: extremities normal, atraumatic, no cyanosis or edema Wound: clean and dry  Disposition: Discharge disposition: 01-Home or Self  Care        Allergies as of 12/14/2019      Reactions   Methimazole Other (See Comments)   Body goes into arthritic shock   Bupropion    Body aches / headaches       Medication List    TAKE these medications   acetaminophen 650 MG CR tablet Commonly known as: TYLENOL Take 650 mg by mouth every 8 (eight) hours as needed for pain.   budesonide-formoterol 160-4.5 MCG/ACT inhaler Commonly known as: Symbicort Inhale 2 puffs into the lungs 2 (two) times daily. What changed:   when to take this  reasons to take this   DULoxetine 60 MG capsule Commonly known as: CYMBALTA TAKE 1 CAPSULE BY MOUTH EVERY DAY What changed: how much to take   furosemide 20 MG tablet Commonly known as: Lasix Take 1 tablet (20 mg total) by mouth daily.   levothyroxine 137 MCG tablet Commonly known as: SYNTHROID TAKE 1 TABLET (137 MCG TOTAL) BY MOUTH DAILY BEFORE BREAKFAST.   metFORMIN 500 MG 24 hr tablet Commonly known as: GLUCOPHAGE-XR Take 1,000 mg by mouth at bedtime.   oxyCODONE 5 MG immediate release tablet Commonly known as: Oxy IR/ROXICODONE Take 1 tablet (5 mg total) by mouth every 8 (eight) hours as needed for severe pain.   pantoprazole 40 MG tablet Commonly known as: PROTONIX Take 2 tablets (80 mg total) by mouth  daily.   predniSONE 20 MG tablet Commonly known as: DELTASONE Take 2 tablets (40 mg total) by mouth daily with breakfast.   pregabalin 150 MG capsule Commonly known as: Lyrica Take 1 capsule (150 mg total) by mouth 2 (two) times daily.   sulfamethoxazole-trimethoprim 800-160 MG tablet Commonly known as: BACTRIM DS Take 1 tablet by mouth 3 (three) times a week. What changed: when to take this        Signed: Sharlene Dory 12/14/2019, 11:26 AM

## 2019-12-14 ENCOUNTER — Inpatient Hospital Stay (HOSPITAL_COMMUNITY): Payer: 59

## 2019-12-14 LAB — GLUCOSE, CAPILLARY
Glucose-Capillary: 153 mg/dL — ABNORMAL HIGH (ref 70–99)
Glucose-Capillary: 278 mg/dL — ABNORMAL HIGH (ref 70–99)

## 2019-12-14 MED ORDER — OXYCODONE HCL 5 MG PO TABS: 5.0000 mg | ORAL_TABLET | Freq: Three times a day (TID) | ORAL | 0 refills | Status: DC | PRN

## 2019-12-14 NOTE — Progress Notes (Signed)
Patient went to bathroom then HR stayed 110-120's. Resting on the edge of bed until HR down to 100's then patient ambulated on the hall way, HR went up to 120's. While ambulating, HR stayed 120's. Patient didn't feel palpitation but shortness breathe. Notified PA Tessa regarding this matter and She is okay to send patient home. HS [employer]

## 2019-12-14 NOTE — Discharge Instructions (Signed)

## 2019-12-14 NOTE — Progress Notes (Signed)
      [address]       [place] [address]             [phone]      2 Days Post-Op Procedure(s) (LRB): XI ROBOTIC ASSISTED THORASCOPY-LUNG BIOPSY of RIGHT UPPER, MIDDLE, AND LOWER LOBES (Right) Intercostal Nerve Block (Right) Subjective: Feels okay today.   Objective: Vital signs in last 24 hours: Temp:  [97.7 F (36.5 C)-98 F (36.7 C)] 98 F (36.7 C) (04/20 2353) Pulse Rate:  [78-95] 86 (04/20 2353) Cardiac Rhythm: Normal sinus rhythm (04/21 0426) Resp:  [10-21] 17 (04/20 2353) BP: (97-147)/(68-95) 132/86 (04/20 2353) SpO2:  [92 %-96 %] 92 % (04/20 2353)     Intake/Output from previous day: 04/20 0701 - 04/21 0700 In: 240 [P.O.:240] Out: 96 [Chest Tube:96] Intake/Output this shift: No intake/output data recorded.  General appearance: alert, cooperative and no distress Heart: regular rate and rhythm, S1, S2 normal, no murmur, click, rub or gallop Lungs: clear to auscultation bilaterally Abdomen: soft, non-tender; bowel sounds normal; no masses,  no organomegaly Extremities: extremities normal, atraumatic, no cyanosis or edema Wound: clean and dry  Lab Results: Recent Labs    12/13/19 0252  WBC 9.6  HGB 11.4*  HCT 35.8*  PLT 241   BMET:  Recent Labs    12/13/19 0252  NA 136  K 4.1  CL 99  CO2 29  GLUCOSE 197*  BUN 9  CREATININE 0.82  CALCIUM 8.5*    PT/INR: No results for input(s): LABPROT, INR in the last 72 hours. ABG    Component Value Date/Time   PHART 7.513 (H) 12/08/2019 1012   HCO3 27.1 12/08/2019 1012   O2SAT 96.5 12/08/2019 1012   CBG (last 3)  Recent Labs    12/13/19 1651 12/13/19 2111 12/14/19 0628  GLUCAP 266* 228* 153*    Assessment/Plan: S/P Procedure(s) (LRB): XI ROBOTIC ASSISTED THORASCOPY-LUNG BIOPSY of RIGHT UPPER, MIDDLE, AND LOWER LOBES (Right) Intercostal Nerve Block (Right)  1. CV-NSR in the 80s, BP well controlled.  2. Tolerating room air with good oxygenation. Ordered an IS for her to  use hourly. CXR stable.  3. Renal-creatinine 0.82, electrolytes okay 4. Endo-elevated initially after surgery but now okay.  5. H and H 11.4/35.8, expected acute blood loss anemia.  6. Pain control-continue Toradol for pain control.    Plan: Chest xray is stable, no air leak. Remove chest tube and follow-up with a PA and lateral film today. Possibly home later today vs tomorrow.    LOS: 2 days    Sharlene Dory 12/14/2019

## 2019-12-14 NOTE — Progress Notes (Signed)
Removed PIV access x 2 and pt received discharge instructions. She understood it well and  took her all belongings. HS [employer]

## 2019-12-14 NOTE — Plan of Care (Signed)

## 2019-12-14 NOTE — Telephone Encounter (Signed)
Rec'd completed paperwork back - fwd to [employer] via interoffice mail -pr  °

## 2019-12-15 ENCOUNTER — Telehealth: Payer: Self-pay

## 2019-12-15 MED ORDER — IBUPROFEN 200 MG PO TABS: 200.0000 mg | ORAL_TABLET | Freq: Four times a day (QID) | ORAL | 0 refills | Status: DC | PRN

## 2019-12-15 NOTE — Telephone Encounter (Signed)
Pt calls office to report persistent R chest pain that isn't managed by Oxy IR and Tylenol. She is s/p robotic assisted thorascopy/R lung biopsies on 12/12/19 by Dr. Tyrone Sage. Pt reports that she had one episode of sharp pain to R back and chest last night, but otherwise describes the pain as a severe ache (and also some soreness to suture site). Pt has had no episodes of sob other than her "usual" sob related to ILD. Pt would like to know if she can increase her dose of Oxy IR and/or take ibuprofen. Pt states she was on Toradol in the hospital and this was quite effective. Discussed situation w/ Dr. Tyrone Sage; he states pt can take OTC dose of ibuprofen. Pt notified. Instructed to call back if ibuprofen does not help and/or for other problems or concerns.

## 2019-12-17 LAB — AEROBIC/ANAEROBIC CULTURE W GRAM STAIN (SURGICAL/DEEP WOUND): Culture: NO GROWTH

## 2019-12-19 LAB — SURGICAL PATHOLOGY

## 2019-12-23 ENCOUNTER — Ambulatory Visit (INDEPENDENT_AMBULATORY_CARE_PROVIDER_SITE_OTHER): Payer: Self-pay

## 2019-12-23 ENCOUNTER — Other Ambulatory Visit: Payer: Self-pay

## 2019-12-23 DIAGNOSIS — Z4802 Encounter for removal of sutures: Secondary | ICD-10-CM

## 2019-12-23 NOTE — Progress Notes (Signed)
Patient arrived for nurse visit to remove 1 sutures post- procedure right Lung biopsy with Dr. Tyrone Sage 12/12/2019.  Sutures removed with no signs/ symptoms of infection noted.  Patient stated that one suture did fall off on it's own.  Only one suture visualized upon observation.  Patient tolerated procedure well.  Applied band-aid to site and advised to clean with soap and water.  Patient/ family instructed to keep the incision sites clean and dry.  Patient/ family acknowledged instructions given. Patient is aware of follow-up appointment and chest xray.  She is no longer taking narcotic pain medication.

## 2019-12-25 ENCOUNTER — Other Ambulatory Visit: Payer: Self-pay | Admitting: Pulmonary Disease

## 2019-12-25 DIAGNOSIS — R609 Edema, unspecified: Secondary | ICD-10-CM

## 2019-12-27 ENCOUNTER — Telehealth: Payer: Self-pay | Admitting: Pulmonary Disease

## 2019-12-27 NOTE — Telephone Encounter (Signed)
Multiple encounters open regarding same information.  Will close this encounter- see 5/4 email thread, or 4/12 email thread.

## 2019-12-27 NOTE — Telephone Encounter (Signed)
Form from [employer] was sent back to [employer] on 4/21 per 12/05/19 email thread.  Checked with Rodell Perna and with Alcario Drought, no other paperwork has been received.

## 2019-12-28 ENCOUNTER — Telehealth: Payer: Self-pay | Admitting: Pulmonary Disease

## 2019-12-28 ENCOUNTER — Encounter: Payer: Self-pay | Admitting: Pulmonary Disease

## 2019-12-28 ENCOUNTER — Other Ambulatory Visit: Payer: Self-pay

## 2019-12-28 ENCOUNTER — Encounter (HOSPITAL_COMMUNITY): Payer: Self-pay | Admitting: *Deleted

## 2019-12-28 ENCOUNTER — Ambulatory Visit: Payer: 59 | Admitting: Pulmonary Disease

## 2019-12-28 DIAGNOSIS — J849 Interstitial pulmonary disease, unspecified: Secondary | ICD-10-CM | POA: Diagnosis not present

## 2019-12-28 MED ORDER — PREDNISONE 5 MG PO TABS: ORAL_TABLET | ORAL | 1 refills | Status: DC

## 2019-12-28 NOTE — Telephone Encounter (Signed)
Instructions from OV 12/28/19  We will start tapering the prednisone by 2.5 mg every 2 weeks.  Will call in a prescription for 5 mg tablets of prednisone Refer you to pulmonary rehab and [facility] clinic for second opinion We will get an echocardiogram at next available Schedule high-resolution CT and PFTs in 1 month Return to clinic after this test.     Rx that was called in for pt's prednisone was 5mg  tabs. Called pt's pharmacy and spoke with , pharmacist about the instructions for pt's prednisone rx. When looking at the amount dispensed, saw that 30abs was dispensed and also 1 refill was added.  Dr. Caryn Bee, to clarify the instructions, pt is to take 1full tab of the 5mg  prednisone for 2 weeks and then go down to 1/2 tab to make 2.5mg  and take that for 2 weeks. Were you wanting pt to stay at 2.5mg  (1/2 tab prednisone) or after pt did 2 weeks of 2.5mg  was pt supposed to completely stop the prednisone?

## 2019-12-28 NOTE — Telephone Encounter (Signed)
Called pt's pharmacy and spoke with Caryn Bee, pharmacist letting him know the info stated by Dr. Doristine Bosworth verbalized understanding. Nothing further needed.

## 2019-12-28 NOTE — Telephone Encounter (Signed)
Patient is currently at 40 mg of prednisone.  She is to reduce by 2.5 mg every 2 weeks  We called in the 5 mg tablets of prednisone so she can use these for the taper.

## 2019-12-28 NOTE — Progress Notes (Signed)
Received referral from [name] for this pt to participate in pulmonary rehab with the the diagnosis of ILD. Clinical review of pt follow up appt on 5/5 Pulmonary office note. Pt with recent hospitalization for Right lung resection x 3 and lung biopsy performed by Dr. Tyrone Sage on 4/19. Pt with Covid Risk Score -1. Pt appropriate for scheduling for Pulmonary rehab once post op appt completed and pt is cleared to proceed with group exercise.  Will forward to support staff for  verification of insurance eligibility/benefits. Alanson Aly, BSN Cardiac and Emergency planning/management officer

## 2019-12-28 NOTE — Progress Notes (Addendum)
[name]    [id number]    [birth year]  Primary Care Physician:Skakle, Liane Comber, DO  Referring Physician: Sueanne Margarita, DO [place],  [place] [address]  Chief complaint: Follow-up for interstitial lung disease, hypersensitivity pneumonitis.  HPI: 44 year old with history of fibromyalgia, depression, anxiety Referred for evaluation of abnormal CT scan, ILD  Complains of increasing dyspnea with chest pressure since mid August 2020.  She had a CTA done by her primary care which did not show any embolism but showed bilateral diffuse pulmonary infiltrates.  COVID-19 test was negative.  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 Trudie Reed, rheumatology.  She had a work-up done including labs which were reportedly negative.  CTD serologies and other labs were negative as well.  She underwent bronchoscopy on 07/12/2019 with findings of lymphocytosis on BAL and granulomas. 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  She was restarted on prednisone post COVID due to worsening dyspnea and tried to taper again  At last visit on 10/26/19 prednisone was down to 5 mg but continued to have persistent symptoms with CT scan showing evolving interstitial lung disease.  Prednisone increased to 40 mg a day.  Pets: Has dogs.  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  She has a down comforter for many years and got a down pillow in 2019. She got rid of the down  after bronchoscopy in late 2020 Smoking history: No significant smoking history Travel history: Previously lived in [place], [place].  Has been living in [place] for the past 18 years.  No significant recent travel Relevant family history: Grandfather died of lung cancer.  No other significant family history of lung disease.  Interim history:  Since she continued to have persistent symptoms in spite of increasing prednisone dose we referred for surigcal lung biopsy She is here for review of biopsy. States that dyspnea is unchanged.  Has episodes of lightheadedness, dizziness, tingling sensation which appears unprovoked Blood sugars are still mildly elevated on prednisone and continues on Metformin per her primary care.  Outpatient Encounter Medications as of 12/28/2019  Medication Sig  . acetaminophen (TYLENOL) 650 MG CR tablet Take 650 mg by mouth every 8 (eight) hours as needed for pain.  . DULoxetine (CYMBALTA) 60 MG capsule TAKE 1 CAPSULE BY MOUTH EVERY DAY (Patient taking differently: Take 60 mg by mouth daily. )  . furosemide (LASIX) 20 MG tablet TAKE 1 TABLET BY MOUTH EVERY DAY  . ibuprofen (ADVIL) 200 MG tablet Take 1 tablet (200 mg total) by mouth every 6 (six) hours as needed.  Marland Kitchen levothyroxine (SYNTHROID) 137 MCG tablet TAKE 1 TABLET (137 MCG TOTAL) BY MOUTH DAILY BEFORE BREAKFAST.  . metFORMIN (GLUCOPHAGE-XR) 500 MG 24 hr tablet Take 1,000 mg by mouth at bedtime.   . pantoprazole (PROTONIX) 40 MG tablet Take 2 tablets (80 mg total) by mouth daily.  . predniSONE (DELTASONE) 20 MG tablet Take 2 tablets (40 mg total) by mouth daily with breakfast.  .  pregabalin (LYRICA) 150 MG capsule Take 1 capsule (150 mg total) by mouth 2 (two) times daily.  Marland Kitchen sulfamethoxazole-trimethoprim (BACTRIM DS) 800-160 MG tablet Take 1 tablet by mouth 3 (three) times a week. (Patient taking differently: Take 1 tablet by mouth every Tuesday, Thursday, and Saturday at 6 PM. )  . predniSONE (DELTASONE)  5 MG tablet As directed by provider   No facility-administered encounter medications on file as of 12/28/2019.   Physical Exam: Blood pressure 120/82, pulse (!) 107, temperature (!) 97.3 F (36.3 C), temperature source Temporal, height _0  (1.651 m), weight 192 lb 3.2 oz (87.2 kg), last menstrual period 12/05/2019, SpO2 97 %. Gen:      No acute distress HEENT:  EOMI, sclera anicteric Neck:     No masses; no thyromegaly Lungs:    Clear to auscultation bilaterally; normal respiratory effort CV:         Regular rate and rhythm; no murmurs Abd:      + bowel sounds; soft, non-tender; no palpable masses, no distension Ext:    No edema; adequate peripheral perfusion Skin:      Warm and dry; no rash Neuro: alert and oriented x 3 Psych: normal mood and affect  Data Reviewed: Imaging: CTA 06/15/2019-no pulmonary embolism, mild diffuse interstitial prominence with bilateral groundglass and nodular airspace opacities.   High-res CT 07/11/2019-Bilateral fine nodularity with groundglass with no significant air trapping.  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 High-res CT 11/02/2019-subpleural reticulation with groundglass, traction bronchiectasis no basal gradient.  Indeterminate for UIP. I have reviewed the images personally.  PFTs: [facility] 11/04/2019 FVC 2.53 [68%], FEV1 2.26 [75%], F/F 89, SVC 1.75 [50%], DLCO 14.61 [65%] Moderate restriction, diffusion defect.  Labs: ANA 03/25/2019-negative,  Rheumatoid factor 03/25/2019- < 14 Repeat CTD serologies 07/18/2019-negative, hypersensitivity panel-negative  CBC 06/15/2019-WBC 5.87, eos 6.4%, absolute eosinophil count 010 Metabolic panel [id number] normal limits D-dimer 06/30/2019-0.41 BNP 06/30/2019-10.   Bronchoscopy 07/12/2019 WBC 510, lymphocytes 38%, eos 12%, Microbiology-negative Cytology -no malignancy Transbronchial biopsy-nonnecrotizing epithelioid granulomas SARS-CoV-2, viral PCR-negative  Surgical lung biopsy 12/12/2019- features  suggestive of chronic hypersensitivity pneumonitis with superimposed subacute lung injury.  Assessment:  Interstitial lung disease, hypersensitivity pneumonitis Post COVID-19 She had initial diagnosis of hypersensitivity pneumonitis with moderate confidence based on bronchoscopy which showed lymphocytes, granulomas on transbronchial biopsies and exposure to down, discussed at ILD conference in December 2020 (Raghu G, [place] 202 (3), Aug 2020)  Initially improved on prednisone but symptoms worsened after COVID-19 infection in January 2021 It is possible that COVID-19 pneumonia may have worsened her ILD. We proceeded with lung biopsy since there was concern from the patient about IPF and there was also a question of NSIP fibrosis based on CT scan findings and possible autoimmune disease though she has negative serologies and no symptoms of connective tissue process.  I have reviewed the pathology in detail with Dr. Vic Ripper.  She has findings of chronic airway centric fibrosis and heterogeneous fibrosis suggestive of fibrotic HP with superimposed organizing pneumonia pattern which may be from COVID-19.  The surgical biopsy was useful since there is no evidence of IPOF or NSIP fibrosis suggestive of connective tissue disease.   My impression at this point is that she was recovering from hypersensitivity pneumonitis in Jan with prednisone and then she got a second hit with COVID-19 causing lung injury and long-haul syndrome.  It is unclear to me at this point if she may develop into a progressive fibrosing interstitial lung disease [address], Sept 2020)  Currently at prednisone 40 mg a day and Bactrim 3 times a week for pneumocystis prophylaxis We will start a slow taper of prednisone by 2.5 mg every 2 weeks. I do not see any need for stronger immunosuppression but if we cannot get her off the prednisone then we may reconsider and if there is progressive fibrosis then start  nintedanib.  She will benefit from pulmonary rehab since deconditioning may be playing big role in presentation. We will also get an echocardiogram to ensure no cardiac issues and refer to [facility] for second opinion.  This appointment required 40 minutes of patient care (this includes precharting, chart review, review of results, face-to-face care, etc.).  Plan/Recommendations: - Continue prednisone with slow taper, Bactrim - Referral to pulmonary rehab, [facility] - Echocardiogram - Rediscuss biopsy at multidisciplinary ILD conference. - Follow-up high-res CT and PFTs in 1 month.  Marshell Garfinkel MD [facility] 12/28/2019, 12:15 PM  CC: Sueanne Margarita, DO

## 2019-12-28 NOTE — Patient Instructions (Signed)
We will start tapering the prednisone by 2.5 mg every 2 weeks.  Will call in a prescription for 5 mg tablets of prednisone Refer you to pulmonary rehab and [facility] clinic for second opinion We will get an echocardiogram at next available Schedule high-resolution CT and PFTs in 1 month Return to clinic after this test.

## 2019-12-29 MED ORDER — AZITHROMYCIN 250 MG PO TABS: ORAL_TABLET | ORAL | 0 refills | Status: DC

## 2019-12-29 NOTE — Telephone Encounter (Signed)
I am working on the additional paperwork and can send it by Monday 5/10 when I am back in office.  Regarding the whooping cough exposure. We can give post exposure prophylaxis is she has close contact with her infected friend. Call in Azithromycin PO 500 mg day 1, followed by 250 mg days 2 through 5

## 2019-12-29 NOTE — Telephone Encounter (Signed)
Dr.Mannam please advise on second message  Thank you!  Also-- I just received word that a friend, whom I had contact with, was diagnosed with Whooping Cough. And I had to notify my dr for possible treatment? Can you inquire this with Dr Isaiah Serge? Also ask if I should check with my primary dr instead...  Thanks!  Lurene Shadow

## 2019-12-29 NOTE — Telephone Encounter (Signed)
Dr.Mannam please advise if you have these forms and if they have been faxed:  Dr Isaiah Serge and I completed the short term disability visit Progress Report paperwork at my 5/5 apt yesterday. He was to complete the page of his info input & have front desk Fax out [employer]. Can you confirm this document was faxed? My claims analyst is Luretha Rued.  Fax # is: [phone].  Form is: Attending Physician Progress Report.    Thank you! Lurene Shadow [phone]

## 2019-12-29 NOTE — Telephone Encounter (Signed)
Called and spoke with patient letting her know about Dr. Daneil Dan recs. Azithromycin has been sent to preferred pharmacy. Nothing further needed at this time

## 2019-12-30 ENCOUNTER — Telehealth (HOSPITAL_COMMUNITY): Payer: Self-pay

## 2019-12-30 NOTE — Telephone Encounter (Signed)
Called pt to advised her that she would have to complete her f/u appt on 01/05/2020 before beginning pulmonary rehab. Pt understood and wanted to know a little more about the pulmonary rehab program, advised pt of that and pt is interested and cant wait to start. Placed pt ppw in the 5/13 f/u folder.

## 2019-12-30 NOTE — Telephone Encounter (Signed)
Pt insurance is active and benefits verified through [place], DED $500/$500 met, out of pocket $3,000/$3,000 met, co-insurance 15%. no pre-authorization required, [employer] 12/30/2019_0 :09am, REF# [id number]

## 2020-01-01 ENCOUNTER — Other Ambulatory Visit: Payer: Self-pay | Admitting: Pulmonary Disease

## 2020-01-02 ENCOUNTER — Ambulatory Visit (HOSPITAL_BASED_OUTPATIENT_CLINIC_OR_DEPARTMENT_OTHER)
Admission: RE | Admit: 2020-01-02 | Discharge: 2020-01-02 | Disposition: A | Discharge status: Home / Self Care | Payer: 59 | Source: Ambulatory Visit | Attending: Pulmonary Disease | Admitting: Pulmonary Disease

## 2020-01-02 ENCOUNTER — Other Ambulatory Visit: Payer: Self-pay

## 2020-01-02 ENCOUNTER — Telehealth: Payer: Self-pay | Admitting: Pulmonary Disease

## 2020-01-02 DIAGNOSIS — J849 Interstitial pulmonary disease, unspecified: Secondary | ICD-10-CM | POA: Diagnosis not present

## 2020-01-02 NOTE — Progress Notes (Signed)
  Echocardiogram 2D Echocardiogram has been performed.  [name] 01/02/2020, 11:46 AM

## 2020-01-02 NOTE — Telephone Encounter (Signed)
Completed forms received from Dr Isaiah Serge for [employer].  Requested OV notes from 10/26/19 to 12/28/19, CT, and lung pathology faxed to [phone]. Nothing further at this time.

## 2020-01-03 ENCOUNTER — Ambulatory Visit: Payer: Self-pay | Admitting: Pulmonary Disease

## 2020-01-03 ENCOUNTER — Ambulatory Visit: Payer: 59 | Admitting: Pulmonary Disease

## 2020-01-04 ENCOUNTER — Other Ambulatory Visit: Payer: Self-pay | Admitting: Cardiothoracic Surgery

## 2020-01-04 DIAGNOSIS — J849 Interstitial pulmonary disease, unspecified: Secondary | ICD-10-CM

## 2020-01-05 ENCOUNTER — Encounter: Payer: Self-pay | Admitting: Cardiothoracic Surgery

## 2020-01-05 ENCOUNTER — Ambulatory Visit
Admission: RE | Admit: 2020-01-05 | Discharge: 2020-01-05 | Disposition: A | Discharge status: Home / Self Care | Payer: 59 | Source: Ambulatory Visit | Attending: Cardiothoracic Surgery | Admitting: Cardiothoracic Surgery

## 2020-01-05 ENCOUNTER — Ambulatory Visit (INDEPENDENT_AMBULATORY_CARE_PROVIDER_SITE_OTHER): Payer: Self-pay | Admitting: Physician Assistant

## 2020-01-05 ENCOUNTER — Other Ambulatory Visit: Payer: Self-pay

## 2020-01-05 VITALS — BP 118/85 | HR 84 | Temp 97.7°F | Resp 18 | Ht 65.0 in | Wt 191.0 lb

## 2020-01-05 DIAGNOSIS — J849 Interstitial pulmonary disease, unspecified: Secondary | ICD-10-CM

## 2020-01-05 NOTE — Progress Notes (Signed)
[address]       [place] [address]             [phone]      [name] is a 44 y.o. female patient status post robot-assisted lung biopsy.  She presents today for her routine follow-up appointment.    1. ILD (interstitial lung disease) (HCC)    Past Medical History:  Diagnosis Date  . Anxiety   . Arthritis   . Depression   . Diabetes mellitus without complication (HCC)   . Dyspnea   . Fibromyalgia   . GERD (gastroesophageal reflux disease)   . Headache    migraines  . Hypothyroidism 1999   post PTU Rx for hyperthyroidism  . IBS (irritable bowel syndrome)   . Interstitial lung disease (HCC)   . Pneumonia 06/2019   Double PNA   No past surgical history pertinent negatives on file. Scheduled Meds:  Current Outpatient Medications on File Prior to Visit  Medication Sig Dispense Refill  . acetaminophen (TYLENOL) 650 MG CR tablet Take 650 mg by mouth every 8 (eight) hours as needed for pain.    . DULoxetine (CYMBALTA) 60 MG capsule TAKE 1 CAPSULE BY MOUTH EVERY DAY (Patient taking differently: Take 60 mg by mouth daily. ) 90 capsule 2  . furosemide (LASIX) 20 MG tablet TAKE 1 TABLET BY MOUTH EVERY DAY 30 tablet 1  . ibuprofen (ADVIL) 200 MG tablet Take 1 tablet (200 mg total) by mouth every 6 (six) hours as needed. 30 tablet 0  . levothyroxine (SYNTHROID) 137 MCG tablet TAKE 1 TABLET (137 MCG TOTAL) BY MOUTH DAILY BEFORE BREAKFAST. 90 tablet 1  . metFORMIN (GLUCOPHAGE-XR) 500 MG 24 hr tablet Take 1,000 mg by mouth at bedtime.     . pantoprazole (PROTONIX) 40 MG tablet Take 2 tablets (80 mg total) by mouth daily. 30 tablet 11  . predniSONE (DELTASONE) 20 MG tablet Take 2 tablets (40 mg total) by mouth daily with breakfast. 60 tablet 2  . predniSONE (DELTASONE) 5 MG tablet As directed by provider 30 tablet 1  . pregabalin (LYRICA) 150 MG capsule Take 1 capsule (150 mg total) by mouth 2 (two) times daily. 60 capsule 3  .  sulfamethoxazole-trimethoprim (BACTRIM DS) 800-160 MG tablet Take 1 tablet by mouth 3 (three) times a week. (Patient taking differently: Take 1 tablet by mouth every Tuesday, Thursday, and Saturday at 6 PM. ) 12 tablet 3   No current facility-administered medications on file prior to visit.    Allergies  Allergen Reactions  . Methimazole Other (See Comments)    Body goes into arthritic shock  . Bupropion     Body aches / headaches    Active Problems:   * No active hospital problems. *  Blood pressure 118/85, pulse 84, temperature 97.7 F (36.5 C), resp. rate 18, height 5\' 5"  (1.651 m), weight 191 lb (86.6 kg), SpO2 96 %.  Subjective   [name] presents today for her routine follow-up visit status post robotic assisted lung biopsies.  Overall she is doing quite well.  She does still have some numbness around the right side of her abdomen.  She states that it feels irritated when she wears high wasted leggings.  Otherwise, she is doing light activity around the house and walking.  Objective   Cor: RRR, no murmur Pulm: CTA bilaterally and in all fields Abdomen: no tenderness Ext: no edema Incisions: port incisions healing well, no drainage  CLINICAL DATA:  Interstitial lung disease  EXAM: CHEST - 2 VIEW  COMPARISON:  December 14, 2019 chest radiograph and chest CT November 02, 2019  FINDINGS: There remains interstitial thickening throughout the lungs bilaterally, likely due to a degree of underlying fibrosis. There is a degree of lower lobe bronchiectatic change. There is no edema or airspace opacity. Heart size and pulmonary vascularity are normal. No adenopathy. No bone lesions.  IMPRESSION: Underlying fibrotic type changes, similar to most recent chest radiograph and chest CT examinations. There is a degree of lower lobe bronchiectatic change. No edema or airspace opacity. Cardiac silhouette within normal limits.   Electronically Signed   By: Lowella Grip  III M.D.   On: 01/05/2020 09:30  Assessment & Plan   [name] is overall doing quite well status post robot-assisted lung biopsies.  She plans to do a pulmonary rehab program over at [facility] in a few weeks.  She continues to use Tylenol and ibuprofen for pain control but no longer is requiring narcotic use.  I cleared her today to drive as long as she feels up to it.  She does have some days where she is more tired than others.  She does state that occasionally she gets dizzy when going from sitting to standing.  She states that this has occurred since her diagnosis of interstitial lung disease.  She plans to follow-up with her pulmonologist now that she is cleared from our practice.  I have not scheduled any routine follow-up with our office but certainly if she has issues with her incisions she is to call our office and make an appointment.  Today, her incisions all looked well-healed without any signs of infection.  She is to shower and wash her incisions with regular soap and water and pat them dry with a clean towel.  All questions were answered to the patient and husband satisfaction.  No routine follow-up planned.  Please call our office if there are any changes or concerns.     Elgie Collard 01/05/2020

## 2020-01-05 NOTE — Patient Instructions (Signed)
You may return to driving an automobile as long as you are no longer requiring oral narcotic pain relievers during the daytime.  It would be wise to start driving only short distances during the daylight and gradually increase from there as you feel comfortable.  You are encouraged to enroll and participate in the outpatient cardiac rehab program beginning as soon as practical.  Make every effort to stay physically active, get some type of exercise on a regular basis, and stick to a "heart healthy diet".  The long term benefits for regular exercise and a healthy diet are critically important to your overall health and wellbeing. 

## 2020-01-10 ENCOUNTER — Telehealth (HOSPITAL_COMMUNITY): Payer: Self-pay | Admitting: Internal Medicine

## 2020-01-11 ENCOUNTER — Telehealth (HOSPITAL_COMMUNITY): Payer: Self-pay

## 2020-01-11 ENCOUNTER — Telehealth (HOSPITAL_COMMUNITY): Payer: Self-pay | Admitting: Internal Medicine

## 2020-01-11 LAB — FUNGUS CULTURE WITH STAIN

## 2020-01-11 LAB — FUNGUS CULTURE RESULT

## 2020-01-11 LAB — FUNGAL ORGANISM REFLEX

## 2020-01-16 ENCOUNTER — Other Ambulatory Visit: Payer: Self-pay

## 2020-01-16 ENCOUNTER — Telehealth (HOSPITAL_COMMUNITY): Payer: Self-pay | Admitting: *Deleted

## 2020-01-16 ENCOUNTER — Encounter (HOSPITAL_COMMUNITY)
Admission: RE | Admit: 2020-01-16 | Discharge: 2020-01-16 | Disposition: A | Discharge status: Home / Self Care | Payer: 59 | Source: Ambulatory Visit | Attending: Pulmonary Disease | Admitting: Pulmonary Disease

## 2020-01-16 VITALS — BP 110/60 | HR 96 | Ht 65.0 in | Wt 196.9 lb

## 2020-01-16 DIAGNOSIS — J849 Interstitial pulmonary disease, unspecified: Secondary | ICD-10-CM | POA: Diagnosis not present

## 2020-01-16 NOTE — Progress Notes (Signed)
[name] 44 y.o. female Pulmonary Rehab Orientation Note Patient arrived today in Cardiac and Pulmonary Rehab for orientation to Pulmonary Rehab. She walked from the [facility] parking deck with moderate shortness of breath.  She does not carry portable oxygen. Per pt, she uses oxygen never. Color good, skin warm and dry. Patient is oriented to time and place. Patient's medical history, psychosocial health, and medications reviewed. Psychosocial assessment reveals pt lives with their family. Pt is currently on short term disability at an insurance company while she is recovering after COVID-19 in January 2021. Pt reports her stress level is moderate. Areas of stress/anxiety include Health.  Pt does not exhibit signs of depression. PHQ2/9 score 0/0. Pt shows good  coping skills with positive outlook . Will continue to monitor and evaluate progress toward psychosocial goal(s) of mental well-being while participating in pulmonary rehab. Physical assessment reveals heart rate is normal, breath sounds clear to auscultation, no wheezes, rales, or rhonchi, she did have crackles in the bases of her lungs. Grip strength equal, strong. Patient reports she does take medications as prescribed. Patient states she follows a Diabetic diet. The patient reports no specific efforts to gain or lose weight..She has gained approximately 50 pounds in the last few years, with being on prednisone a huge factor.  Patient's weight will be monitored closely. Demonstration and practice of PLB using pulse oximeter. Patient able to return demonstration satisfactorily. Safety and hand hygiene in the exercise area reviewed with patient. Patient voices understanding of the information reviewed. Department expectations discussed with patient and achievable goals were set. The patient shows enthusiasm about attending the program and we look forward to working with this nice lady. The patient completed a 6 min walk test today, 01/16/2020 and to begin  exercise on Tuesday, 01/24/2020 in the 1300 exercise slot. She did not require oxygen during her walk test. [id number]

## 2020-01-16 NOTE — Progress Notes (Signed)
Pulmonary Individual Treatment Plan  Patient Details  Name: [name] MRN: [id number] Date of Birth: 1975-12-04 Referring Provider:     Pulmonary Rehab Walk Test from 01/16/2020 in [facility]  Referring Provider  [name]      Initial Encounter Date:    Pulmonary Rehab Walk Test from 01/16/2020 in [facility] CARD[facility]  Date  01/16/20      Visit Diagnosis: Interstitial lung disease (HCC)  Patient's Home Medications on Admission:   Current Outpatient Medications:  .  acetaminophen (TYLENOL) 650 MG CR tablet, Take 650 mg by mouth every 8 (eight) hours as needed for pain., Disp: , Rfl:  .  DULoxetine (CYMBALTA) 60 MG capsule, TAKE 1 CAPSULE BY MOUTH EVERY DAY (Patient taking differently: Take 60 mg by mouth daily. ), Disp: 90 capsule, Rfl: 2 .  levothyroxine (SYNTHROID) 137 MCG tablet, TAKE 1 TABLET (137 MCG TOTAL) BY MOUTH DAILY BEFORE BREAKFAST., Disp: 90 tablet, Rfl: 1 .  metFORMIN (GLUCOPHAGE-XR) 500 MG 24 hr tablet, Take 1,000 mg by mouth at bedtime. , Disp: , Rfl:  .  pantoprazole (PROTONIX) 40 MG tablet, Take 2 tablets (80 mg total) by mouth daily., Disp: 30 tablet, Rfl: 11 .  predniSONE (DELTASONE) 20 MG tablet, Take 2 tablets (40 mg total) by mouth daily with breakfast., Disp: 60 tablet, Rfl: 2 .  predniSONE (DELTASONE) 5 MG tablet, As directed by provider, Disp: 30 tablet, Rfl: 1 .  pregabalin (LYRICA) 150 MG capsule, Take 1 capsule (150 mg total) by mouth 2 (two) times daily., Disp: 60 capsule, Rfl: 3 .  sulfamethoxazole-trimethoprim (BACTRIM DS) 800-160 MG tablet, Take 1 tablet by mouth 3 (three) times a week. (Patient taking differently: Take 1 tablet by mouth every Tuesday, Thursday, and Saturday at 6 PM. ), Disp: 12 tablet, Rfl: 3 .  furosemide (LASIX) 20 MG tablet, TAKE 1 TABLET BY MOUTH EVERY DAY (Patient not taking: Reported on 01/16/2020), Disp: 30 tablet, Rfl: 1 .  ibuprofen (ADVIL) 200 MG tablet, Take 1 tablet  (200 mg total) by mouth every 6 (six) hours as needed. (Patient not taking: Reported on 01/16/2020), Disp: 30 tablet, Rfl: 0  Past Medical History: Past Medical History:  Diagnosis Date  . Anxiety   . Arthritis   . Depression   . Diabetes mellitus without complication (HCC)   . Dyspnea   . Fibromyalgia   . GERD (gastroesophageal reflux disease)   . Headache    migraines  . Hypothyroidism 1999   post PTU Rx for hyperthyroidism  . IBS (irritable bowel syndrome)   . Interstitial lung disease (HCC)   . Pneumonia 06/2019   Double PNA    Tobacco Use: Social History   Tobacco Use  Smoking Status Never Smoker  Smokeless Tobacco Never Used    Labs: Recent Review Flowsheet Data    Labs for ITP Cardiac and Pulmonary Rehab Latest Ref Rng & Units 06/24/2018 03/25/2019 11/01/2019 12/02/2019 12/08/2019   Cholestrol 0 - 200 mg/dL - - - - -   LDLCALC 0 - 99 mg/dL - - - - -   HDL >25.05 mg/dL - - - - -   Trlycerides 0.0 - 149.0 mg/dL - - - - -   Hemoglobin A1c 4.6 - 6.5 % 5.9 5.9(H) 7.7(H) 8.6(H) -   PHART 7.350 - 7.450 - - - - 7.513(H)   PCO2ART 32.0 - 48.0 mmHg - - - - 33.9   HCO3 20.0 - 28.0 mmol/L - - - -  27.1   O2SAT % - - - - 96.5      Capillary Blood Glucose: Lab Results  Component Value Date   GLUCAP 278 (H) 12/14/2019   GLUCAP 153 (H) 12/14/2019   GLUCAP 228 (H) 12/13/2019   GLUCAP 266 (H) 12/13/2019   GLUCAP 256 (H) 12/13/2019     Pulmonary Assessment Scores: Pulmonary Assessment Scores    Row Name 01/16/20 1144 01/16/20 1203       ADL UCSD   ADL Phase  Entry  Entry    SOB Score total  75  --      CAT Score   CAT Score  24  --      mMRC Score   mMRC Score  --  4      UCSD: Self-administered rating of dyspnea associated with activities of daily living (ADLs) 6-point scale (0 = "not at all" to 5 = "maximal or unable to do because of breathlessness")  Scoring Scores range from 0 to 120.  Minimally important difference is 5 units  CAT: CAT can identify the  health impairment of COPD patients and is better correlated with disease progression.  CAT has a scoring range of zero to 40. The CAT score is classified into four groups of low (less than 10), medium (10 - 20), high (21-30) and very high (31-40) based on the impact level of disease on health status. A CAT score over 10 suggests significant symptoms.  A worsening CAT score could be explained by an exacerbation, poor medication adherence, poor inhaler technique, or progression of COPD or comorbid conditions.  CAT MCID is 2 points  mMRC: mMRC (Modified Medical Research Council) Dyspnea Scale is used to assess the degree of baseline functional disability in patients of respiratory disease due to dyspnea. No minimal important difference is established. A decrease in score of 1 point or greater is considered a positive change.   Pulmonary Function Assessment: Pulmonary Function Assessment - 01/16/20 1124      Breath   Bilateral Breath Sounds  Clear    Shortness of Breath  Limiting activity;Yes       Exercise Target Goals: Exercise Program Goal: Individual exercise prescription set using results from initial 6 min walk test and THRR while considering  patient's activity barriers and safety.   Exercise Prescription Goal: Initial exercise prescription builds to 30-45 minutes a day of aerobic activity, 2-3 days per week.  Home exercise guidelines will be given to patient during program as part of exercise prescription that the participant will acknowledge.  Activity Barriers & Risk Stratification: Activity Barriers & Cardiac Risk Stratification - 01/16/20 1119      Activity Barriers & Cardiac Risk Stratification   Activity Barriers  Deconditioning;Muscular Weakness;Shortness of Breath       6 Minute Walk: 6 Minute Walk    Row Name 01/16/20 1203         6 Minute Walk   Phase  Initial     Distance  1254 feet     Walk Time  6 minutes     # of Rest Breaks  0     MPH  2.38     METS   4.27     RPE  12     Perceived Dyspnea   3     VO2 Peak  14.93     Symptoms  No     Resting HR  97 bpm     Resting BP  110/60     Resting  Oxygen Saturation   98 %     Exercise Oxygen Saturation  during 6 min walk  93 %     Max Ex. HR  133 bpm     Max Ex. BP  134/80     2 Minute Post BP  124/74       Interval HR   1 Minute HR  104     2 Minute HR  106     3 Minute HR  120     4 Minute HR  114     5 Minute HR  114     6 Minute HR  133     2 Minute Post HR  92     Interval Heart Rate?  Yes       Interval Oxygen   Interval Oxygen?  Yes     Baseline Oxygen Saturation %  98 %     1 Minute Oxygen Saturation %  96 %     1 Minute Liters of Oxygen  0 L     2 Minute Oxygen Saturation %  93 %     2 Minute Liters of Oxygen  0 L     3 Minute Oxygen Saturation %  95 %     3 Minute Liters of Oxygen  0 L     4 Minute Oxygen Saturation %  97 %     4 Minute Liters of Oxygen  0 L     5 Minute Oxygen Saturation %  94 %     5 Minute Liters of Oxygen  0 L     6 Minute Oxygen Saturation %  95 %     6 Minute Liters of Oxygen  0 L     2 Minute Post Oxygen Saturation %  98 %     2 Minute Post Liters of Oxygen  0 L        Oxygen Initial Assessment: Oxygen Initial Assessment - 01/16/20 1202      Home Oxygen   Home Oxygen Device  None    Sleep Oxygen Prescription  None    Home Exercise Oxygen Prescription  None    Home at Rest Exercise Oxygen Prescription  None    Compliance with Home Oxygen Use  Yes      Initial 6 min Walk   Oxygen Used  None      Program Oxygen Prescription   Program Oxygen Prescription  None      Intervention   Short Term Goals  To learn and exhibit compliance with exercise, home and travel O2 prescription;To learn and understand importance of monitoring SPO2 with pulse oximeter and demonstrate accurate use of the pulse oximeter.;To learn and understand importance of maintaining oxygen saturations>88%;To learn and demonstrate proper pursed lip breathing techniques or  other breathing techniques.;To learn and demonstrate proper use of respiratory medications    Long  Term Goals  Exhibits compliance with exercise, home and travel O2 prescription;Verbalizes importance of monitoring SPO2 with pulse oximeter and return demonstration;Maintenance of O2 saturations>88%;Exhibits proper breathing techniques, such as pursed lip breathing or other method taught during program session;Compliance with respiratory medication;Demonstrates proper use of MDI's       Oxygen Re-Evaluation:   Oxygen Discharge (Final Oxygen Re-Evaluation):   Initial Exercise Prescription: Initial Exercise Prescription - 01/16/20 1200      Date of Initial Exercise RX and Referring Provider   Date  01/16/20    Referring Provider  Dr. Isaiah SergeMannam  Treadmill   MPH  2    Grade  0    Minutes  15      T5 Nustep   Level  2    SPM  80    Minutes  15      Prescription Details   Frequency (times per week)  2    Duration  Progress to 30 minutes of continuous aerobic without signs/symptoms of physical distress      Intensity   THRR 40-80% of Max Heartrate  70-141    Ratings of Perceived Exertion  11-13    Perceived Dyspnea  0-4      Progression   Progression  Continue to progress workloads to maintain intensity without signs/symptoms of physical distress.      Resistance Training   Training Prescription  Yes    Weight  orange bands    Reps  10-15       Perform Capillary Blood Glucose checks as needed.  Exercise Prescription Changes:   Exercise Comments:   Exercise Goals and Review: Exercise Goals    Row Name 01/16/20 1209             Exercise Goals   Increase Physical Activity  Yes       Intervention  Provide advice, education, support and counseling about physical activity/exercise needs.;Develop an individualized exercise prescription for aerobic and resistive training based on initial evaluation findings, risk stratification, comorbidities and participant's  personal goals.       Expected Outcomes  Short Term: Attend rehab on a regular basis to increase amount of physical activity.;Long Term: Add in home exercise to make exercise part of routine and to increase amount of physical activity.;Long Term: Exercising regularly at least 3-5 days a week.       Increase Strength and Stamina  Yes       Intervention  Provide advice, education, support and counseling about physical activity/exercise needs.;Develop an individualized exercise prescription for aerobic and resistive training based on initial evaluation findings, risk stratification, comorbidities and participant's personal goals.       Expected Outcomes  Short Term: Increase workloads from initial exercise prescription for resistance, speed, and METs.;Short Term: Perform resistance training exercises routinely during rehab and add in resistance training at home;Long Term: Improve cardiorespiratory fitness, muscular endurance and strength as measured by increased METs and functional capacity ( )       Able to understand and use rate of perceived exertion (RPE) scale  Yes       Intervention  Provide education and explanation on how to use RPE scale       Expected Outcomes  Short Term: Able to use RPE daily in rehab to express subjective intensity level;Long Term:  Able to use RPE to guide intensity level when exercising independently       Able to understand and use Dyspnea scale  Yes       Intervention  Provide education and explanation on how to use Dyspnea scale       Expected Outcomes  Short Term: Able to use Dyspnea scale daily in rehab to express subjective sense of shortness of breath during exertion;Long Term: Able to use Dyspnea scale to guide intensity level when exercising independently       Knowledge and understanding of Target Heart Rate Range (THRR)  Yes       Intervention  Provide education and explanation of THRR including how the numbers were predicted and where they are located for  reference  Expected Outcomes  Short Term: Able to state/look up THRR;Short Term: Able to use daily as guideline for intensity in rehab;Long Term: Able to use THRR to govern intensity when exercising independently       Understanding of Exercise Prescription  Yes       Intervention  Provide education, explanation, and written materials on patient's individual exercise prescription       Expected Outcomes  Short Term: Able to explain program exercise prescription;Long Term: Able to explain home exercise prescription to exercise independently          Exercise Goals Re-Evaluation :   Discharge Exercise Prescription (Final Exercise Prescription Changes):   Nutrition:  Target Goals: Understanding of nutrition guidelines, daily intake of sodium 1500mg , cholesterol 200mg , calories 30% from fat and 7% or less from saturated fats, daily to have 5 or more servings of fruits and vegetables.  Biometrics: Pre Biometrics - 01/16/20 1120      Pre Biometrics   Height  5\' 5"  (1.651 m)    Weight  89.3 kg    BMI (Calculated)  32.76    Grip Strength  24 kg        Nutrition Therapy Plan and Nutrition Goals:   Nutrition Assessments:   Nutrition Goals Re-Evaluation:   Nutrition Goals Discharge (Final Nutrition Goals Re-Evaluation):   Psychosocial: Target Goals: Acknowledge presence or absence of significant depression and/or stress, maximize coping skills, provide positive support system. Participant is able to verbalize types and ability to use techniques and skills needed for reducing stress and depression.  Initial Review & Psychosocial Screening: Initial Psych Review & Screening - 01/16/20 1125      Initial Review   Current issues with  None Identified      Family Dynamics   Good Support System?  Yes      Barriers   Psychosocial barriers to participate in program  There are no identifiable barriers or psychosocial needs.      Screening Interventions   Interventions   Encouraged to exercise       Quality of Life Scores:  Scores of 19 and below usually indicate a poorer quality of life in these areas.  A difference of  2-3 points is a clinically meaningful difference.  A difference of 2-3 points in the total score of the Quality of Life Index has been associated with significant improvement in overall quality of life, self-image, physical symptoms, and general health in studies assessing change in quality of life.  PHQ-9: Recent Review Flowsheet Data    Depression screen [facility] 2/9 01/16/2020 04/08/2019 03/25/2019 03/10/2019 01/19/2019   Decreased Interest 0 1 0 1 0   Down, Depressed, Hopeless 0 1 0 1 0   PHQ - 2 Score 0 2 0 2 0   Altered sleeping 0 1 - 0 -   Tired, decreased energy 0 1 - 0 -   Change in appetite 0 1 - 0 -   Feeling bad or failure about yourself  0 0 - 0 -   Trouble concentrating 0 0 - 0 -   Moving slowly or fidgety/restless 0 - - 0 -   Suicidal thoughts 0 0 - 0 -   PHQ-9 Score 0 5 - 2 -   Difficult doing work/chores Not difficult at all Somewhat difficult - Not difficult at all -     Interpretation of Total Score  Total Score Depression Severity:  1-4 = Minimal depression, 5-9 = Mild depression, 10-14 = Moderate depression, 15-19 =  Moderately severe depression, 20-27 = Severe depression   Psychosocial Evaluation and Intervention: Psychosocial Evaluation - 01/16/20 1126      Psychosocial Evaluation & Interventions   Interventions  Encouraged to exercise with the program and follow exercise prescription    Comments  No concern identified    Expected Outcomes  For patient to be free of psychosocial concerns while in pulmonary rehab.    Continue Psychosocial Services   No Follow up required       Psychosocial Re-Evaluation:   Psychosocial Discharge (Final Psychosocial Re-Evaluation):   Education: Education Goals: Education classes will be provided on a weekly basis, covering required topics. Participant will state  understanding/return demonstration of topics presented.  Learning Barriers/Preferences: Learning Barriers/Preferences - 01/16/20 1126      Learning Barriers/Preferences   Learning Barriers  None    Learning Preferences  Computer/Internet       Education Topics: Risk Factor Reduction:  -Group instruction that is supported by a PowerPoint presentation. Instructor discusses the definition of a risk factor, different risk factors for pulmonary disease, and how the heart and lungs work together.     Nutrition for Pulmonary Patient:  -Group instruction provided by PowerPoint slides, verbal discussion, and written materials to support subject matter. The instructor gives an explanation and review of healthy diet recommendations, which includes a discussion on weight management, recommendations for fruit and vegetable consumption, as well as protein, fluid, caffeine, fiber, sodium, sugar, and alcohol. Tips for eating when patients are short of breath are discussed.   Pursed Lip Breathing:  -Group instruction that is supported by demonstration and informational handouts. Instructor discusses the benefits of pursed lip and diaphragmatic breathing and detailed demonstration on how to preform both.     Oxygen Safety:  -Group instruction provided by PowerPoint, verbal discussion, and written material to support subject matter. There is an overview of "What is Oxygen" and "Why do we need it".  Instructor also reviews how to create a safe environment for oxygen use, the importance of using oxygen as prescribed, and the risks of noncompliance. There is a brief discussion on traveling with oxygen and resources the patient may utilize.   Oxygen Equipment:  -Group instruction provided by [facility] Staff utilizing handouts, written materials, and equipment demonstrations.   Signs and Symptoms:  -Group instruction provided by written material and verbal discussion to support subject matter. Warning  signs and symptoms of infection, stroke, and heart attack are reviewed and when to call the physician/911 reinforced. Tips for preventing the spread of infection discussed.   Advanced Directives:  -Group instruction provided by verbal instruction and written material to support subject matter. Instructor reviews Advanced Directive laws and proper instruction for filling out document.   Pulmonary Video:  -Group video education that reviews the importance of medication and oxygen compliance, exercise, good nutrition, pulmonary hygiene, and pursed lip and diaphragmatic breathing for the pulmonary patient.   Exercise for the Pulmonary Patient:  -Group instruction that is supported by a PowerPoint presentation. Instructor discusses benefits of exercise, core components of exercise, frequency, duration, and intensity of an exercise routine, importance of utilizing pulse oximetry during exercise, safety while exercising, and options of places to exercise outside of rehab.     Pulmonary Medications:  -Verbally interactive group education provided by instructor with focus on inhaled medications and proper administration.   Anatomy and Physiology of the Respiratory System and Intimacy:  -Group instruction provided by PowerPoint, verbal discussion, and written material to support subject matter. Instructor  reviews respiratory cycle and anatomical components of the respiratory system and their functions. Instructor also reviews differences in obstructive and restrictive respiratory diseases with examples of each. Intimacy, Sex, and Sexuality differences are reviewed with a discussion on how relationships can change when diagnosed with pulmonary disease. Common sexual concerns are reviewed.   MD DAY -A group question and answer session with a medical doctor that allows participants to ask questions that relate to their pulmonary disease state.   OTHER EDUCATION -Group or individual verbal, written, or  video instructions that support the educational goals of the pulmonary rehab program.   Holiday Eating Survival Tips:  -Group instruction provided by PowerPoint slides, verbal discussion, and written materials to support subject matter. The instructor gives patients tips, tricks, and techniques to help them not only survive but enjoy the holidays despite the onslaught of food that accompanies the holidays.   Knowledge Questionnaire Score: Knowledge Questionnaire Score - 01/16/20 1139      Knowledge Questionnaire Score   Pre Score  16/18       Core Components/Risk Factors/Patient Goals at Admission: Personal Goals and Risk Factors at Admission - 01/16/20 1217      Core Components/Risk Factors/Patient Goals on Admission   Improve shortness of breath with ADL's  Yes    Intervention  Provide education, individualized exercise plan and daily activity instruction to help decrease symptoms of SOB with activities of daily living.    Expected Outcomes  Short Term: Improve cardiorespiratory fitness to achieve a reduction of symptoms when performing ADLs;Long Term: Be able to perform more ADLs without symptoms or delay the onset of symptoms       Core Components/Risk Factors/Patient Goals Review:  Goals and Risk Factor Review    Row Name 01/16/20 1217             Core Components/Risk Factors/Patient Goals Review   Personal Goals Review  Increase knowledge of respiratory medications and ability to use respiratory devices properly.;Improve shortness of breath with ADL's;Develop more efficient breathing techniques such as purse lipped breathing and diaphragmatic breathing and practicing self-pacing with activity.          Core Components/Risk Factors/Patient Goals at Discharge (Final Review):  Goals and Risk Factor Review - 01/16/20 1217      Core Components/Risk Factors/Patient Goals Review   Personal Goals Review  Increase knowledge of respiratory medications and ability to use  respiratory devices properly.;Improve shortness of breath with ADL's;Develop more efficient breathing techniques such as purse lipped breathing and diaphragmatic breathing and practicing self-pacing with activity.       ITP Comments:   Comments:

## 2020-01-16 NOTE — Telephone Encounter (Signed)
Called to see if patient could come to pulmonary rehab @ 1030 this morning 01/16/20 for orientation/walk test, we had a cancellation today.

## 2020-01-17 ENCOUNTER — Other Ambulatory Visit: Payer: Self-pay | Admitting: Pulmonary Disease

## 2020-01-17 DIAGNOSIS — R609 Edema, unspecified: Secondary | ICD-10-CM

## 2020-01-18 ENCOUNTER — Other Ambulatory Visit: Payer: Self-pay | Admitting: *Deleted

## 2020-01-18 ENCOUNTER — Ambulatory Visit: Payer: 59 | Admitting: Pulmonary Disease

## 2020-01-18 DIAGNOSIS — R609 Edema, unspecified: Secondary | ICD-10-CM

## 2020-01-18 MED ORDER — FUROSEMIDE 20 MG PO TABS: 20.0000 mg | ORAL_TABLET | Freq: Every day | ORAL | 1 refills | Status: DC

## 2020-01-20 NOTE — Telephone Encounter (Signed)
I would advise to get the COVID-19 vaccine to protect you from another infection We can cancel the PFTs at [facility] on 6/21.

## 2020-01-20 NOTE — Telephone Encounter (Signed)
Message sent to patient and PFT for our office canceled per [name]. Nothing further needed at this time.

## 2020-01-20 NOTE — Telephone Encounter (Signed)
[name] please advise on patient mychart message:  Questioning if I should/n't get the Covid vaccine? I'm concerned due to still having respiratory issues if I should even get it, or if it's necessary. Thoughts?  Also, my referral to [facility] is for 6/30. They will be performing a PFT that day. Is it necessary for me to the one scheduled with [facility], for 6/21??  Please advise on both.

## 2020-01-24 ENCOUNTER — Encounter (HOSPITAL_COMMUNITY)
Admission: RE | Admit: 2020-01-24 | Discharge: 2020-01-24 | Disposition: A | Discharge status: Home / Self Care | Payer: 59 | Source: Ambulatory Visit | Attending: Pulmonary Disease | Admitting: Pulmonary Disease

## 2020-01-24 ENCOUNTER — Other Ambulatory Visit: Payer: Self-pay

## 2020-01-24 VITALS — Wt 196.7 lb

## 2020-01-24 DIAGNOSIS — J849 Interstitial pulmonary disease, unspecified: Secondary | ICD-10-CM | POA: Diagnosis present

## 2020-01-24 LAB — ACID FAST CULTURE WITH REFLEXED SENSITIVITIES (MYCOBACTERIA): Acid Fast Culture: NEGATIVE

## 2020-01-24 NOTE — Progress Notes (Signed)
Daily Session Note  Patient Details  Name: [name] MRN: [id number] Date of Birth: 06-20-1976 Referring Provider:     Pulmonary Rehab Walk Test from 01/16/2020 in [place]  Referring Provider  Dr. Vaughan Browner      Encounter Date: 01/24/2020  Check In: Session Check In - 01/24/20 1417      Check-In   Supervising physician immediately available to respond to emergencies  [facility] immediately available    Physician(s)  Dr. Broadus John    Location  [facility]    Staff Present  Rosebud Poles, RN, Bjorn Loser, MS, CEP, Exercise Physiologist;David Banner [facility] MS, EP-C, CCRP    Virtual Visit  No    Medication changes reported      No    Fall or balance concerns reported     No    Tobacco Cessation  No Change    Warm-up and Cool-down  Performed on first and last piece of equipment    Resistance Training Performed  Yes    VAD Patient?  No    PAD/SET Patient?  No      Pain Assessment   Currently in Pain?  No/denies    Multiple Pain Sites  No       Capillary Blood Glucose: No results found for this or any previous visit (from the past 24 hour(s)). POCT Glucose - 01/24/20 1505      POCT Blood Glucose   Pre-Exercise  254 mg/dL    Post-Exercise  236 mg/dL        Social History   Tobacco Use  Smoking Status Never Smoker  Smokeless Tobacco Never Used    Goals Met:  Proper associated with RPD/PD & O2 Sat Exercise tolerated well Strength training completed today  Goals Unmet:  Not Applicable  Comments: Service time is from 1250 to 1400    Dr. Fransico Him is Medical Director for Cardiac Rehab at [facility].

## 2020-01-26 ENCOUNTER — Encounter: Payer: Self-pay | Admitting: Primary Care

## 2020-01-26 ENCOUNTER — Other Ambulatory Visit: Payer: Self-pay

## 2020-01-26 ENCOUNTER — Encounter (HOSPITAL_COMMUNITY)
Admission: RE | Admit: 2020-01-26 | Discharge: 2020-01-26 | Disposition: A | Discharge status: Home / Self Care | Payer: 59 | Source: Ambulatory Visit | Attending: Pulmonary Disease | Admitting: Pulmonary Disease

## 2020-01-26 DIAGNOSIS — J849 Interstitial pulmonary disease, unspecified: Secondary | ICD-10-CM

## 2020-01-26 NOTE — Progress Notes (Signed)
Daily Session Note  Patient Details  Name: [name] MRN: [id number] Date of Birth: 09/08/1975 Referring Provider:     Pulmonary Rehab Walk Test from 01/16/2020 in [place]  Referring Provider  Dr. Vaughan Browner      Encounter Date: 01/26/2020  Check In: Session Check In - 01/26/20 1347      Check-In   Supervising physician immediately available to respond to emergencies  [facility] immediately available    Physician(s)  Dr. Cyndia Skeeters    Location  [facility]    Staff Present  Rosebud Poles, RN, Bjorn Loser, MS, CEP, Exercise Physiologist;David [facility] MS, EP-C, CCRP    Virtual Visit  No    Medication changes reported      No    Fall or balance concerns reported     No    Tobacco Cessation  No Change    Warm-up and Cool-down  Performed on first and last piece of equipment    Resistance Training Performed  Yes    VAD Patient?  No    PAD/SET Patient?  No      Pain Assessment   Currently in Pain?  No/denies    Multiple Pain Sites  No       Capillary Blood Glucose: No results found for this or any previous visit (from the past 24 hour(s)). POCT Glucose - 01/26/20 1431      POCT Blood Glucose   Pre-Exercise  156 mg/dL    Post-Exercise  280 mg/dL        Social History   Tobacco Use  Smoking Status Never Smoker  Smokeless Tobacco Never Used    Goals Met:  Proper associated with RPD/PD & O2 Sat Exercise tolerated well Strength training completed today  Goals Unmet:  Not Applicable  Comments: Service time is from 1245 to [place]    Dr. Fransico Him is Medical Director for Cardiac Rehab at [facility].

## 2020-01-26 NOTE — Progress Notes (Signed)
[name] 44 y.o. female Nutrition Note  Visit Diagnosis: Interstitial lung disease [facility])  Past Medical History:  Diagnosis Date  . Anxiety   . Arthritis   . Depression   . Diabetes mellitus without complication ([place])   . Dyspnea   . Fibromyalgia   . GERD (gastroesophageal reflux disease)   . Headache    migraines  . Hypothyroidism 1999   post PTU Rx for hyperthyroidism  . IBS (irritable bowel syndrome)   . Interstitial lung disease ([place])   . Pneumonia 06/2019   Double PNA     Medications reviewed.   Current Outpatient Medications:  .  acetaminophen (TYLENOL) 650 MG CR tablet, Take 650 mg by mouth every 8 (eight) hours as needed for pain., Disp: , Rfl:  .  DULoxetine (CYMBALTA) 60 MG capsule, TAKE 1 CAPSULE BY MOUTH EVERY DAY (Patient taking differently: Take 60 mg by mouth daily. ), Disp: 90 capsule, Rfl: 2 .  furosemide (LASIX) 20 MG tablet, Take 1 tablet (20 mg total) by mouth daily., Disp: 30 tablet, Rfl: 1 .  ibuprofen (ADVIL) 200 MG tablet, Take 1 tablet (200 mg total) by mouth every 6 (six) hours as needed. (Patient not taking: Reported on 01/16/2020), Disp: 30 tablet, Rfl: 0 .  levothyroxine (SYNTHROID) 137 MCG tablet, TAKE 1 TABLET (137 MCG TOTAL) BY MOUTH DAILY BEFORE BREAKFAST., Disp: 90 tablet, Rfl: 1 .  metFORMIN (GLUCOPHAGE-XR) 500 MG 24 hr tablet, Take 1,000 mg by mouth at bedtime. , Disp: , Rfl:  .  pantoprazole (PROTONIX) 40 MG tablet, Take 2 tablets (80 mg total) by mouth daily., Disp: 30 tablet, Rfl: 11 .  predniSONE (DELTASONE) 20 MG tablet, Take 2 tablets (40 mg total) by mouth daily with breakfast., Disp: 60 tablet, Rfl: 2 .  predniSONE (DELTASONE) 5 MG tablet, As directed by provider, Disp: 30 tablet, Rfl: 1 .  pregabalin (LYRICA) 150 MG capsule, Take 1 capsule (150 mg total) by mouth 2 (two) times daily., Disp: 60 capsule, Rfl: 3 .  sulfamethoxazole-trimethoprim (BACTRIM DS) 800-160 MG tablet, Take 1 tablet by mouth 3 (three) times a week. (Patient  taking differently: Take 1 tablet by mouth every Tuesday, Thursday, and Saturday at 6 PM. ), Disp: 12 tablet, Rfl: 3   Ht Readings from Last 1 Encounters:  01/16/20 5\' 5"  (1.651 m)     Wt Readings from Last 3 Encounters:  01/24/20 196 lb 10.4 oz (89.2 kg)  01/16/20 196 lb 13.9 oz (89.3 kg)  01/05/20 191 lb (86.6 kg)     There is no height or weight on file to calculate BMI.   Social History   Tobacco Use  Smoking Status Never Smoker  Smokeless Tobacco Never Used        Nutrition Note  Spoke with pt. Nutrition Plan and Nutrition Survey goals reviewed with pt.   Pt has Type 2 Diabetes (seemingly steroid induced). Pt checks CBG's 1 times a day. Fasting CBG's reportedly 125-160 mg/dL, outlier 200 mg/dl.   Pt reports weight gain of about 50 lbs. She says she's at her highest weight. She has dieted many times. She did keto in 2019 and lost 40 lbs (185-> 145 lbs).  24 hr recall: Breakfast: wheat toast and egg Lunch: wrap or sandwich Dinner: hello fresh  Snacks: cheez its or chips  Drinks: seltzer, crystal light, wine/vodka 1-2 drinks daily, am coffee with sugar free creamer   States inconsistency with exercise due to fibromyalgia and hypothyroidism. She has not exercised much in past year.  She reports extreme fatigue.   Pt expressed understanding of the information reviewed.   Nutrition Diagnosis ? Excessive carbohydrate intake related to food preferences and lack of food related knowledge as evidenced by A1C 7.7 and fasting CBGs 125-200 mg/dl (self reported)  Nutrition Intervention ? Pt's individual nutrition plan reviewed with pt. ? Benefits of adopting healthy diet reviewed with Rate My Plate survey ? Continue client-centered nutrition education by RD, as part of interdisciplinary care.  Goal(s) ? CBG concentrations in the normal range or as close to normal as is safely possible. ? Improved blood glucose control as evidenced by pt's A1c trending from 7.7 toward less  than 7.0. ? Pt able to name foods that affect blood glucose  Plan:   Will provide client-centered nutrition education as part of interdisciplinary care  Monitor and evaluate progress toward nutrition goal with team.   Andrey Campanile, MS, RDN, LDN

## 2020-01-27 ENCOUNTER — Ambulatory Visit (HOSPITAL_BASED_OUTPATIENT_CLINIC_OR_DEPARTMENT_OTHER)
Admission: RE | Admit: 2020-01-27 | Discharge: 2020-01-27 | Disposition: A | Discharge status: Home / Self Care | Payer: 59 | Source: Ambulatory Visit | Attending: Pulmonary Disease | Admitting: Pulmonary Disease

## 2020-01-27 ENCOUNTER — Other Ambulatory Visit: Payer: Self-pay | Admitting: Pulmonary Disease

## 2020-01-27 DIAGNOSIS — J849 Interstitial pulmonary disease, unspecified: Secondary | ICD-10-CM | POA: Insufficient documentation

## 2020-01-27 DIAGNOSIS — R609 Edema, unspecified: Secondary | ICD-10-CM

## 2020-01-31 ENCOUNTER — Encounter (HOSPITAL_COMMUNITY)
Admission: RE | Admit: 2020-01-31 | Discharge: 2020-01-31 | Disposition: A | Discharge status: Home / Self Care | Payer: 59 | Source: Ambulatory Visit | Attending: Pulmonary Disease | Admitting: Pulmonary Disease

## 2020-01-31 ENCOUNTER — Other Ambulatory Visit: Payer: Self-pay

## 2020-01-31 DIAGNOSIS — J849 Interstitial pulmonary disease, unspecified: Secondary | ICD-10-CM

## 2020-01-31 NOTE — Progress Notes (Signed)
Daily Session Note  Patient Details  Name: [name] MRN: [id number] Date of Birth: 05-27-1976 Referring Provider:     Pulmonary Rehab Walk Test from 01/16/2020 in [place]  Referring Provider  Dr. Vaughan Carter      Encounter Date: 01/31/2020  Check In: Session Check In - 01/31/20 1326      Check-In   Supervising physician immediately available to respond to emergencies  [facility] immediately available    Physician(s)  Dr. Cruzita Lederer    Location  [facility]    Staff Present  Rosebud Poles, RN, Bjorn Loser, MS, CEP, Exercise Physiologist;[name], RN    Virtual Visit  No    Medication changes reported      No    Fall or balance concerns reported     No    Tobacco Cessation  No Change    Warm-up and Cool-down  Performed as group-led instruction    Resistance Training Performed  Yes    VAD Patient?  No    PAD/SET Patient?  No      Pain Assessment   Currently in Pain?  No/denies    Multiple Pain Sites  No       Capillary Blood Glucose: No results found for this or any previous visit (from the past 24 hour(s)).    Social History   Tobacco Use  Smoking Status Never Smoker  Smokeless Tobacco Never Used    Goals Met:  Exercise tolerated well No report of cardiac concerns or symptoms Strength training completed today  Goals Unmet:  Not Applicable  Comments: Service time is from 1255 to 1400    Dr. Fransico Him is Medical Director for Cardiac Rehab at [facility].

## 2020-02-01 ENCOUNTER — Other Ambulatory Visit: Payer: Self-pay | Admitting: Pulmonary Disease

## 2020-02-01 ENCOUNTER — Ambulatory Visit (HOSPITAL_COMMUNITY): Payer: 59

## 2020-02-01 NOTE — Telephone Encounter (Signed)
[name], please advise.  Received 1st dose Covid vaccine last week. 2nd dose end of month (Phizer).  However, couple things- reduced down at the moment to 32.5mg  prednisone. Symptoms to date: hard to breathe, activity/ resting, coughing nonproductive, chest pain- pressure is throughout/ daily. Worsens on activity, at night & as week has progressed- is more painful. Talking, showering are hard to do. On 2nd week of rehab.  CT scan only reviewed by radiology- when is dr going to review? I believe it stated not much progression of fibrosis- but I'm unsure and do not want to wait until 6/28 visit.  Please advise if I need to be seen prior to 6/28 & with whom.  Nuriyah Hanline [phone] Thank You.

## 2020-02-02 ENCOUNTER — Encounter (HOSPITAL_COMMUNITY)
Admission: RE | Admit: 2020-02-02 | Discharge: 2020-02-02 | Disposition: A | Discharge status: Home / Self Care | Payer: 59 | Source: Ambulatory Visit | Attending: Pulmonary Disease | Admitting: Pulmonary Disease

## 2020-02-02 ENCOUNTER — Other Ambulatory Visit: Payer: Self-pay

## 2020-02-02 VITALS — Wt 196.0 lb

## 2020-02-02 DIAGNOSIS — J849 Interstitial pulmonary disease, unspecified: Secondary | ICD-10-CM | POA: Diagnosis not present

## 2020-02-02 NOTE — Progress Notes (Signed)
Nutrition Note  Spoke with pt to review food log and glucose log.  Fasting CBGs are slightly elevated 130-150 mg/dl.  Midday they are running >200 mg/dl. Recently they were >300 mg/dl midday. Recommended pt call her PCP. She was able to get an appointment and glipizide was added to her meds. A1C was 7.9 mg/dl (up from 7.7 mg/dl). Her TSH was low. She has been experiencing extreme fatigue, high stress, elevated CBGs. Today we discussed side effects of glipizide. Recommended not to skip meals and reviewed glipizide MOA.  She will continue checking CBGs at least twice daily and continue with food log. She has been modifying carb intake since we spoke last week.  Will continue to monitor pt during her time in pulmonary rehab.  Andrey Campanile, MS, RDN, LDN

## 2020-02-02 NOTE — Progress Notes (Signed)
I have reviewed a Home Exercise Prescription with [name] . [name] is not currently exercising at home.  The patient was advised to walk 3 days a week for 45-60 minutes.  [name] and I discussed how to progress their exercise prescription.  The patient stated that their goals were to increase stamina, to breathe better, and to get back to work.  The patient stated that they understand the exercise prescription.  We reviewed exercise guidelines, target heart rate during exercise, RPE Scale, weather conditions, NTG use, endpoints for exercise, warmup and cool down.  Patient is encouraged to come to me with any questions. I will continue to follow up with the patient to assist them with progression and safety.

## 2020-02-02 NOTE — Progress Notes (Signed)
Daily Session Note  Patient Details  Name: [name] MRN: [id number] Date of Birth: February 09, 1976 Referring Provider:     Pulmonary Rehab Walk Test from 01/16/2020 in [place]  Referring Provider Dr. Vaughan Browner      Encounter Date: 02/02/2020  Check In:  Session Check In - 02/02/20 1324      Check-In   Supervising physician immediately available to respond to emergencies [facility] immediately available    Physician(s) Dr. Cruzita Lederer    Location [facility]    Staff Present Rosebud Poles, RN, Bjorn Loser, MS, CEP, Exercise Physiologist;Stoy Fenn Ysidro Evert, RN    Virtual Visit No    Medication changes reported     No    Fall or balance concerns reported    No    Tobacco Cessation No Change    Warm-up and Cool-down Performed on first and last piece of equipment    Resistance Training Performed Yes    VAD Patient? No    PAD/SET Patient? No      Pain Assessment   Currently in Pain? No/denies    Multiple Pain Sites No           Capillary Blood Glucose: No results found for this or any previous visit (from the past 24 hour(s)).    Social History   Tobacco Use  Smoking Status Never Smoker  Smokeless Tobacco Never Used    Goals Met:  Exercise tolerated well No report of cardiac concerns or symptoms Strength training completed today  Goals Unmet:  Not Applicable  Comments: Service time is from 1250 to [place] is Market researcher for Cardiac Rehab at [facility].

## 2020-02-06 NOTE — Telephone Encounter (Signed)
I called patient on 6/11 and discussed her concerns in detail. Nothing further needed

## 2020-02-07 ENCOUNTER — Other Ambulatory Visit: Payer: Self-pay

## 2020-02-07 ENCOUNTER — Encounter (HOSPITAL_COMMUNITY)
Admission: RE | Admit: 2020-02-07 | Discharge: 2020-02-07 | Disposition: A | Discharge status: Home / Self Care | Payer: 59 | Source: Ambulatory Visit | Attending: Pulmonary Disease | Admitting: Pulmonary Disease

## 2020-02-07 DIAGNOSIS — J849 Interstitial pulmonary disease, unspecified: Secondary | ICD-10-CM

## 2020-02-07 NOTE — Progress Notes (Signed)
Daily Session Note  Patient Details  Name: [name] MRN: [id number] Date of Birth: [birth year] Referring Provider:     Pulmonary Rehab Walk Test from 01/16/2020 in [place]  Referring Provider Dr. Vaughan Browner      Encounter Date: 02/02/2020  Check In:   Capillary Blood Glucose: No results found for this or any previous visit (from the past 24 hour(s)).   Exercise Prescription Changes - 02/07/20 1400      Response to Exercise   Blood Pressure (Admit) 122/80    Blood Pressure (Exercise) 142/82    Blood Pressure (Exit) 128/76    Heart Rate (Admit) 104 bpm    Heart Rate (Exercise) 122 bpm    Heart Rate (Exit) 111 bpm    Oxygen Saturation (Admit) 97 %    Oxygen Saturation (Exercise) 96 %    Oxygen Saturation (Exit) 98 %    Rating of Perceived Exertion (Exercise) 11    Perceived Dyspnea (Exercise) 2    Duration Continue with 30 min of aerobic exercise without signs/symptoms of physical distress.    Intensity --   40-80% HRR     Progression   Progression Continue to progress workloads to maintain intensity without signs/symptoms of physical distress.      Resistance Training   Training Prescription Yes    Weight orange bands    Reps 10-15    Time 10 Minutes      Treadmill   MPH 2    Grade 0    Minutes 15      T5 Nustep   Level 3    SPM 80    Minutes 15    METs 1.9           Social History   Tobacco Use  Smoking Status Never Smoker  Smokeless Tobacco Never Used    Goals Met:  Proper associated with RPD/PD & O2 Sat Exercise tolerated well Strength training completed today  Goals Unmet:  Not Applicable  Comments: Service time is from 1250 to [place]    Dr. Fransico Him is Medical Director for Cardiac Rehab at [facility].

## 2020-02-07 NOTE — Progress Notes (Signed)
Nutrition Note  Spoke with pt during exercise to review food log.  She started glipizide last week. Her fasting CBGs have been WNL.  She continues to have higher CBGs at lunch time/through evening. Only one day >300 mg/dl which is progress.  Goal to increase fiber rich carbs at every meal. Reviewed fiber education and pt verbalized understanding with teach back method. Pt feels overwhelmed with her health maintenance. She reveals feeling poorly about weight gain which results in eating more than normal. We discussed focusing on one aspect of health for sustainability. She is ready to focus on diabetes management at this point. This week she will keep another food log with goal of incorporating fiber at each meal.   Will continue to monitor pt during her time in pulmonary rehab.   Andrey Campanile, MS, RDN, LDN

## 2020-02-08 NOTE — Progress Notes (Signed)
Pulmonary Individual Treatment Plan  Patient Details  Name: [name] MRN: [id number] Date of Birth: [birth year] Referring Provider:     Pulmonary Rehab Walk Test from 01/16/2020 in [facility]  Referring Provider Dr. Vaughan Browner      Initial Encounter Date:    Pulmonary Rehab Walk Test from 01/16/2020 in [place]  Date 01/16/20      Visit Diagnosis: Interstitial lung disease ([place])  Patient's Home Medications on Admission:   Current Outpatient Medications:  .  acetaminophen (TYLENOL) 650 MG CR tablet, Take 650 mg by mouth every 8 (eight) hours as needed for pain., Disp: , Rfl:  .  DULoxetine (CYMBALTA) 60 MG capsule, TAKE 1 CAPSULE BY MOUTH EVERY DAY (Patient taking differently: Take 60 mg by mouth daily. ), Disp: 90 capsule, Rfl: 2 .  furosemide (LASIX) 20 MG tablet, Take 1 tablet (20 mg total) by mouth daily., Disp: 30 tablet, Rfl: 1 .  ibuprofen (ADVIL) 200 MG tablet, Take 1 tablet (200 mg total) by mouth every 6 (six) hours as needed. (Patient not taking: Reported on 01/16/2020), Disp: 30 tablet, Rfl: 0 .  levothyroxine (SYNTHROID) 137 MCG tablet, TAKE 1 TABLET (137 MCG TOTAL) BY MOUTH DAILY BEFORE BREAKFAST., Disp: 90 tablet, Rfl: 1 .  metFORMIN (GLUCOPHAGE-XR) 500 MG 24 hr tablet, Take 1,000 mg by mouth at bedtime. , Disp: , Rfl:  .  pantoprazole (PROTONIX) 40 MG tablet, Take 2 tablets (80 mg total) by mouth daily., Disp: 30 tablet, Rfl: 11 .  predniSONE (DELTASONE) 20 MG tablet, TAKE 2 TABLETS (40 MG TOTAL) BY MOUTH DAILY WITH BREAKFAST., Disp: 60 tablet, Rfl: 2 .  predniSONE (DELTASONE) 5 MG tablet, As directed by provider, Disp: 30 tablet, Rfl: 1 .  pregabalin (LYRICA) 150 MG capsule, Take 1 capsule (150 mg total) by mouth 2 (two) times daily., Disp: 60 capsule, Rfl: 3 .  sulfamethoxazole-trimethoprim (BACTRIM DS) 800-160 MG tablet, Take 1 tablet by mouth 3 (three) times a week. (Patient taking differently: Take 1  tablet by mouth every Tuesday, Thursday, and Saturday at 6 PM. ), Disp: 12 tablet, Rfl: 3  Past Medical History: Past Medical History:  Diagnosis Date  . Anxiety   . Arthritis   . Depression   . Diabetes mellitus without complication ([place])   . Dyspnea   . Fibromyalgia   . GERD (gastroesophageal reflux disease)   . Headache    migraines  . Hypothyroidism 1999   post PTU Rx for hyperthyroidism  . IBS (irritable bowel syndrome)   . Interstitial lung disease ([place])   . Pneumonia 06/2019   Double PNA    Tobacco Use: Social History   Tobacco Use  Smoking Status Never Smoker  Smokeless Tobacco Never Used    Labs: Recent Review Flowsheet Data    Labs for ITP Cardiac and Pulmonary Rehab Latest Ref Rng & Units 06/24/2018 03/25/2019 11/01/2019 12/02/2019 12/08/2019   Cholestrol 0 - 200 mg/dL - - - - -   LDLCALC 0 - 99 mg/dL - - - - -   HDL >39.00 mg/dL - - - - -   Trlycerides 0 - 149 mg/dL - - - - -   Hemoglobin A1c 4.6 - 6.5 % 5.9 5.9(H) 7.7(H) 8.6(H) -   PHART 7.35 - 7.45 - - - - 7.513(H)   PCO2ART 32 - 48 mmHg - - - - 33.9   HCO3 20.0 - 28.0 mmol/L - - - - 27.1   O2SAT % - - - -  96.5      Capillary Blood Glucose: Lab Results  Component Value Date   GLUCAP 278 (H) 12/14/2019   GLUCAP 153 (H) 12/14/2019   GLUCAP 228 (H) 12/13/2019   GLUCAP 266 (H) 12/13/2019   GLUCAP 256 (H) 12/13/2019    POCT Glucose    Row Name 01/24/20 1505 01/26/20 1431           POCT Blood Glucose   Pre-Exercise 254 mg/dL 156 mg/dL      Post-Exercise 236 mg/dL 280 mg/dL             Pulmonary Assessment Scores:  Pulmonary Assessment Scores    Row Name 01/16/20 1144 01/16/20 1203       ADL UCSD   ADL Phase Entry Entry    SOB Score total 75 --      CAT Score   CAT Score 24 --      mMRC Score   mMRC Score -- 4          UCSD: Self-administered rating of dyspnea associated with activities of daily living (ADLs) 6-point scale (0 = "not at all" to 5 = "maximal or unable to do  because of breathlessness")  Scoring Scores range from 0 to 120.  Minimally important difference is 5 units  CAT: CAT can identify the health impairment of COPD patients and is better correlated with disease progression.  CAT has a scoring range of zero to 40. The CAT score is classified into four groups of low (less than 10), medium (10 - 20), high (21-30) and very high (31-40) based on the impact level of disease on health status. A CAT score over 10 suggests significant symptoms.  A worsening CAT score could be explained by an exacerbation, poor medication adherence, poor inhaler technique, or progression of COPD or comorbid conditions.  CAT MCID is 2 points  mMRC: mMRC (Modified Medical Research Council) Dyspnea Scale is used to assess the degree of baseline functional disability in patients of respiratory disease due to dyspnea. No minimal important difference is established. A decrease in score of 1 point or greater is considered a positive change.   Pulmonary Function Assessment:  Pulmonary Function Assessment - 01/16/20 1124      Breath   Bilateral Breath Sounds Clear    Shortness of Breath Limiting activity;Yes           Exercise Target Goals: Exercise Program Goal: Individual exercise prescription set using results from initial 6 min walk test and THRR while considering  patient's activity barriers and safety.   Exercise Prescription Goal: Initial exercise prescription builds to 30-45 minutes a day of aerobic activity, 2-3 days per week.  Home exercise guidelines will be given to patient during program as part of exercise prescription that the participant will acknowledge.  Activity Barriers & Risk Stratification:  Activity Barriers & Cardiac Risk Stratification - 01/16/20 1119      Activity Barriers & Cardiac Risk Stratification   Activity Barriers Deconditioning;Muscular Weakness;Shortness of Breath           6 Minute Walk:  6 Minute Walk    Row Name 01/16/20  1203         6 Minute Walk   Phase Initial     Distance 1254 feet     Walk Time 6 minutes     # of Rest Breaks 0     MPH 2.38     METS 4.27     RPE 12     Perceived   Dyspnea  3     VO2 Peak 14.93     Symptoms No     Resting HR 97 bpm     Resting BP 110/60     Resting Oxygen Saturation  98 %     Exercise Oxygen Saturation  during 6 min walk 93 %     Max Ex. HR 133 bpm     Max Ex. BP 134/80     2 Minute Post BP 124/74       Interval HR   1 Minute HR 104     2 Minute HR 106     3 Minute HR 120     4 Minute HR 114     5 Minute HR 114     6 Minute HR 133     2 Minute Post HR 92     Interval Heart Rate? Yes       Interval Oxygen   Interval Oxygen? Yes     Baseline Oxygen Saturation % 98 %     1 Minute Oxygen Saturation % 96 %     1 Minute Liters of Oxygen 0 L     2 Minute Oxygen Saturation % 93 %     2 Minute Liters of Oxygen 0 L     3 Minute Oxygen Saturation % 95 %     3 Minute Liters of Oxygen 0 L     4 Minute Oxygen Saturation % 97 %     4 Minute Liters of Oxygen 0 L     5 Minute Oxygen Saturation % 94 %     5 Minute Liters of Oxygen 0 L     6 Minute Oxygen Saturation % 95 %     6 Minute Liters of Oxygen 0 L     2 Minute Post Oxygen Saturation % 98 %     2 Minute Post Liters of Oxygen 0 L            Oxygen Initial Assessment:  Oxygen Initial Assessment - 01/16/20 1202      Home Oxygen   Home Oxygen Device None    Sleep Oxygen Prescription None    Home Exercise Oxygen Prescription None    Home at Rest Exercise Oxygen Prescription None    Compliance with Home Oxygen Use Yes      Initial 6 min Walk   Oxygen Used None      Program Oxygen Prescription   Program Oxygen Prescription None      Intervention   Short Term Goals To learn and exhibit compliance with exercise, home and travel O2 prescription;To learn and understand importance of monitoring SPO2 with pulse oximeter and demonstrate accurate use of the pulse oximeter.;To learn and understand  importance of maintaining oxygen saturations>88%;To learn and demonstrate proper pursed lip breathing techniques or other breathing techniques.;To learn and demonstrate proper use of respiratory medications    Long  Term Goals Exhibits compliance with exercise, home and travel O2 prescription;Verbalizes importance of monitoring SPO2 with pulse oximeter and return demonstration;Maintenance of O2 saturations>88%;Exhibits proper breathing techniques, such as pursed lip breathing or other method taught during program session;Compliance with respiratory medication;Demonstrates proper use of MDI's           Oxygen Re-Evaluation:  Oxygen Re-Evaluation    Row Name 02/07/20 1551             Program Oxygen Prescription   Program Oxygen Prescription None         Home  Oxygen   Home Oxygen Device None       Sleep Oxygen Prescription None       Home Exercise Oxygen Prescription None       Home at Rest Exercise Oxygen Prescription None       Compliance with Home Oxygen Use Yes         Goals/Expected Outcomes   Short Term Goals To learn and exhibit compliance with exercise, home and travel O2 prescription;To learn and understand importance of monitoring SPO2 with pulse oximeter and demonstrate accurate use of the pulse oximeter.;To learn and understand importance of maintaining oxygen saturations>88%;To learn and demonstrate proper pursed lip breathing techniques or other breathing techniques.;To learn and demonstrate proper use of respiratory medications       Long  Term Goals Exhibits compliance with exercise, home and travel O2 prescription;Verbalizes importance of monitoring SPO2 with pulse oximeter and return demonstration;Maintenance of O2 saturations>88%;Exhibits proper breathing techniques, such as pursed lip breathing or other method taught during program session;Compliance with respiratory medication;Demonstrates proper use of MDI's       Goals/Expected Outcomes compliance               Oxygen Discharge (Final Oxygen Re-Evaluation):  Oxygen Re-Evaluation - 02/07/20 1551      Program Oxygen Prescription   Program Oxygen Prescription None      Home Oxygen   Home Oxygen Device None    Sleep Oxygen Prescription None    Home Exercise Oxygen Prescription None    Home at Rest Exercise Oxygen Prescription None    Compliance with Home Oxygen Use Yes      Goals/Expected Outcomes   Short Term Goals To learn and exhibit compliance with exercise, home and travel O2 prescription;To learn and understand importance of monitoring SPO2 with pulse oximeter and demonstrate accurate use of the pulse oximeter.;To learn and understand importance of maintaining oxygen saturations>88%;To learn and demonstrate proper pursed lip breathing techniques or other breathing techniques.;To learn and demonstrate proper use of respiratory medications    Long  Term Goals Exhibits compliance with exercise, home and travel O2 prescription;Verbalizes importance of monitoring SPO2 with pulse oximeter and return demonstration;Maintenance of O2 saturations>88%;Exhibits proper breathing techniques, such as pursed lip breathing or other method taught during program session;Compliance with respiratory medication;Demonstrates proper use of MDI's    Goals/Expected Outcomes compliance           Initial Exercise Prescription:  Initial Exercise Prescription - 01/16/20 1200      Date of Initial Exercise RX and Referring Provider   Date 01/16/20    Referring Provider Dr. Vaughan Browner      Treadmill   MPH 2    Grade 0    Minutes 15      T5 Nustep   Level 2    SPM 80    Minutes 15      Prescription Details   Frequency (times per week) 2    Duration Progress to 30 minutes of continuous aerobic without signs/symptoms of physical distress      Intensity   THRR 40-80% of Max Heartrate 70-141    Ratings of Perceived Exertion 11-13    Perceived Dyspnea 0-4      Progression   Progression Continue to progress  workloads to maintain intensity without signs/symptoms of physical distress.      Resistance Training   Training Prescription Yes    Weight orange bands    Reps 10-15  Perform Capillary Blood Glucose checks as needed.  Exercise Prescription Changes:  Exercise Prescription Changes    Row Name 02/02/20 1500 02/07/20 1400           Response to Exercise   Blood Pressure (Admit) -- 122/80      Blood Pressure (Exercise) -- 142/82      Blood Pressure (Exit) -- 128/76      Heart Rate (Admit) -- 104 bpm      Heart Rate (Exercise) -- 122 bpm      Heart Rate (Exit) -- 111 bpm      Oxygen Saturation (Admit) -- 97 %      Oxygen Saturation (Exercise) -- 96 %      Oxygen Saturation (Exit) -- 98 %      Rating of Perceived Exertion (Exercise) -- 11      Perceived Dyspnea (Exercise) -- 2      Duration -- Continue with 30 min of aerobic exercise without signs/symptoms of physical distress.      Intensity -- --  40-80% HRR        Progression   Progression -- Continue to progress workloads to maintain intensity without signs/symptoms of physical distress.        Resistance Training   Training Prescription -- Yes      Weight -- orange bands      Reps -- 10-15      Time -- 10 Minutes        Treadmill   MPH -- 2      Grade -- 0      Minutes -- 15        T5 Nustep   Level -- 3      SPM -- 80      Minutes -- 15      METs -- 1.9        Home Exercise Plan   Plans to continue exercise at Home (comment) --      Frequency Add 3 additional days to program exercise sessions. --      Initial Home Exercises Provided 02/02/20 --             Exercise Comments:  Exercise Comments    Row Name 02/02/20 1501           Exercise Comments home exercise complete              Exercise Goals and Review:  Exercise Goals    Row Name 01/16/20 1209 02/07/20 1552           Exercise Goals   Increase Physical Activity Yes Yes      Intervention Provide advice, education,  support and counseling about physical activity/exercise needs.;Develop an individualized exercise prescription for aerobic and resistive training based on initial evaluation findings, risk stratification, comorbidities and participant's personal goals. Provide advice, education, support and counseling about physical activity/exercise needs.;Develop an individualized exercise prescription for aerobic and resistive training based on initial evaluation findings, risk stratification, comorbidities and participant's personal goals.      Expected Outcomes Short Term: Attend rehab on a regular basis to increase amount of physical activity.;Long Term: Add in home exercise to make exercise part of routine and to increase amount of physical activity.;Long Term: Exercising regularly at least 3-5 days a week. Short Term: Attend rehab on a regular basis to increase amount of physical activity.;Long Term: Add in home exercise to make exercise part of routine and to increase amount of physical activity.;Long Term: Exercising regularly at   least 3-5 days a week.      Increase Strength and Stamina Yes Yes      Intervention Provide advice, education, support and counseling about physical activity/exercise needs.;Develop an individualized exercise prescription for aerobic and resistive training based on initial evaluation findings, risk stratification, comorbidities and participant's personal goals. Provide advice, education, support and counseling about physical activity/exercise needs.;Develop an individualized exercise prescription for aerobic and resistive training based on initial evaluation findings, risk stratification, comorbidities and participant's personal goals.      Expected Outcomes Short Term: Increase workloads from initial exercise prescription for resistance, speed, and METs.;Short Term: Perform resistance training exercises routinely during rehab and add in resistance training at home;Long Term: Improve  cardiorespiratory fitness, muscular endurance and strength as measured by increased METs and functional capacity (6MWT) Short Term: Increase workloads from initial exercise prescription for resistance, speed, and METs.;Short Term: Perform resistance training exercises routinely during rehab and add in resistance training at home;Long Term: Improve cardiorespiratory fitness, muscular endurance and strength as measured by increased METs and functional capacity (6MWT)      Able to understand and use rate of perceived exertion (RPE) scale Yes Yes      Intervention Provide education and explanation on how to use RPE scale Provide education and explanation on how to use RPE scale      Expected Outcomes Short Term: Able to use RPE daily in rehab to express subjective intensity level;Long Term:  Able to use RPE to guide intensity level when exercising independently Short Term: Able to use RPE daily in rehab to express subjective intensity level;Long Term:  Able to use RPE to guide intensity level when exercising independently      Able to understand and use Dyspnea scale Yes Yes      Intervention Provide education and explanation on how to use Dyspnea scale Provide education and explanation on how to use Dyspnea scale      Expected Outcomes Short Term: Able to use Dyspnea scale daily in rehab to express subjective sense of shortness of breath during exertion;Long Term: Able to use Dyspnea scale to guide intensity level when exercising independently Short Term: Able to use Dyspnea scale daily in rehab to express subjective sense of shortness of breath during exertion;Long Term: Able to use Dyspnea scale to guide intensity level when exercising independently      Knowledge and understanding of Target Heart Rate Range (THRR) Yes Yes      Intervention Provide education and explanation of THRR including how the numbers were predicted and where they are located for reference Provide education and explanation of THRR  including how the numbers were predicted and where they are located for reference      Expected Outcomes Short Term: Able to state/look up THRR;Short Term: Able to use daily as guideline for intensity in rehab;Long Term: Able to use THRR to govern intensity when exercising independently Short Term: Able to state/look up THRR;Short Term: Able to use daily as guideline for intensity in rehab;Long Term: Able to use THRR to govern intensity when exercising independently      Understanding of Exercise Prescription Yes Yes      Intervention Provide education, explanation, and written materials on patient's individual exercise prescription Provide education, explanation, and written materials on patient's individual exercise prescription      Expected Outcomes Short Term: Able to explain program exercise prescription;Long Term: Able to explain home exercise prescription to exercise independently Short Term: Able to explain program exercise prescription;Long Term: Able  to explain home exercise prescription to exercise independently             Exercise Goals Re-Evaluation :  Exercise Goals Re-Evaluation    Row Name 02/07/20 1553             Exercise Goal Re-Evaluation   Exercise Goals Review Increase Strength and Stamina;Increase Physical Activity;Able to understand and use rate of perceived exertion (RPE) scale;Able to understand and use Dyspnea scale;Knowledge and understanding of Target Heart Rate Range (THRR);Understanding of Exercise Prescription       Comments Pt has completed 5 exercise sessions. She has a positive attitude and gives good effort. She currently exercises at 1.9 METs on the stepper. Will continue to monitor and progress as able.       Expected Outcomes Through exercise at rehab and at home, the patient will decrease shortness of breath with daily activities and feel confident in carrying out an exercise regime at home.              Discharge Exercise Prescription (Final  Exercise Prescription Changes):  Exercise Prescription Changes - 02/07/20 1400      Response to Exercise   Blood Pressure (Admit) 122/80    Blood Pressure (Exercise) 142/82    Blood Pressure (Exit) 128/76    Heart Rate (Admit) 104 bpm    Heart Rate (Exercise) 122 bpm    Heart Rate (Exit) 111 bpm    Oxygen Saturation (Admit) 97 %    Oxygen Saturation (Exercise) 96 %    Oxygen Saturation (Exit) 98 %    Rating of Perceived Exertion (Exercise) 11    Perceived Dyspnea (Exercise) 2    Duration Continue with 30 min of aerobic exercise without signs/symptoms of physical distress.    Intensity --   40-80% HRR     Progression   Progression Continue to progress workloads to maintain intensity without signs/symptoms of physical distress.      Resistance Training   Training Prescription Yes    Weight orange bands    Reps 10-15    Time 10 Minutes      Treadmill   MPH 2    Grade 0    Minutes 15      T5 Nustep   Level 3    SPM 80    Minutes 15    METs 1.9           Nutrition:  Target Goals: Understanding of nutrition guidelines, daily intake of sodium <1544m, cholesterol <2024m calories 30% from fat and 7% or less from saturated fats, daily to have 5 or more servings of fruits and vegetables.  Biometrics:  Pre Biometrics - 01/16/20 1120      Pre Biometrics   Height 5' 5" (1.651 m)    Weight 89.3 kg    BMI (Calculated) 32.76    Grip Strength 24 kg            Nutrition Therapy Plan and Nutrition Goals:  Nutrition Therapy & Goals - 01/26/20 1407      Nutrition Therapy   Diet Carb Modified/generally healthy      Personal Nutrition Goals   Nutrition Goal Improved blood glucose control as evidenced by pt's A1c trending from 7.7 toward less than 7.0.    Personal Goal #2 CBG concentrations in the normal range or as close to normal as is safely possible    Personal Goal #3 Pt able to name foods that affect blood glucose      Intervention Plan  Intervention Prescribe,  educate and counsel regarding individualized specific dietary modifications aiming towards targeted core components such as weight, hypertension, lipid management, diabetes, heart failure and other comorbidities.;Nutrition handout(s) given to patient.    Expected Outcomes Short Term Goal: A plan has been developed with personal nutrition goals set during dietitian appointment.           Nutrition Assessments:  Nutrition Assessments - 01/26/20 1408      Rate Your Plate Scores   Pre Score 52           Nutrition Goals Re-Evaluation:  Nutrition Goals Re-Evaluation    Row Name 01/26/20 1408             Goals   Current Weight 196 lb (88.9 kg)       Nutrition Goal Improved blood glucose control as evidenced by pt's A1c trending from 7.7 toward less than 7.0.         Personal Goal #2 Re-Evaluation   Personal Goal #2 CBG concentrations in the normal range or as close to normal as is safely possible         Personal Goal #3 Re-Evaluation   Personal Goal #3 Pt able to name foods that affect blood glucose              Nutrition Goals Discharge (Final Nutrition Goals Re-Evaluation):  Nutrition Goals Re-Evaluation - 01/26/20 1408      Goals   Current Weight 196 lb (88.9 kg)    Nutrition Goal Improved blood glucose control as evidenced by pt's A1c trending from 7.7 toward less than 7.0.      Personal Goal #2 Re-Evaluation   Personal Goal #2 CBG concentrations in the normal range or as close to normal as is safely possible      Personal Goal #3 Re-Evaluation   Personal Goal #3 Pt able to name foods that affect blood glucose           Psychosocial: Target Goals: Acknowledge presence or absence of significant depression and/or stress, maximize coping skills, provide positive support system. Participant is able to verbalize types and ability to use techniques and skills needed for reducing stress and depression.  Initial Review & Psychosocial Screening:  Initial Psych Review  & Screening - 01/16/20 1125      Initial Review   Current issues with None Identified      Family Dynamics   Good Support System? Yes      Barriers   Psychosocial barriers to participate in program There are no identifiable barriers or psychosocial needs.      Screening Interventions   Interventions Encouraged to exercise           Quality of Life Scores:  Scores of 19 and below usually indicate a poorer quality of life in these areas.  A difference of  2-3 points is a clinically meaningful difference.  A difference of 2-3 points in the total score of the Quality of Life Index has been associated with significant improvement in overall quality of life, self-image, physical symptoms, and general health in studies assessing change in quality of life.  PHQ-9: Recent Review Flowsheet Data    Depression screen PHQ 2/9 01/16/2020 04/08/2019 03/25/2019 03/10/2019 01/19/2019   Decreased Interest 0 1 0 1 0   Down, Depressed, Hopeless 0 1 0 1 0   PHQ - 2 Score 0 2 0 2 0   Altered sleeping 0 1 - 0 -   Tired, decreased energy 0 1 -   0 -   Change in appetite 0 1 - 0 -   Feeling bad or failure about yourself  0 0 - 0 -   Trouble concentrating 0 0 - 0 -   Moving slowly or fidgety/restless 0 - - 0 -   Suicidal thoughts 0 0 - 0 -   PHQ-9 Score 0 5 - 2 -   Difficult doing work/chores Not difficult at all Somewhat difficult - Not difficult at all -     Interpretation of Total Score  Total Score Depression Severity:  1-4 = Minimal depression, 5-9 = Mild depression, 10-14 = Moderate depression, 15-19 = Moderately severe depression, 20-27 = Severe depression   Psychosocial Evaluation and Intervention:  Psychosocial Evaluation - 01/16/20 1126      Psychosocial Evaluation & Interventions   Interventions Encouraged to exercise with the program and follow exercise prescription    Comments No concern identified    Expected Outcomes For patient to be free of psychosocial concerns while in pulmonary  rehab.    Continue Psychosocial Services  No Follow up required           Psychosocial Re-Evaluation:  Psychosocial Re-Evaluation    Row Name 02/06/20 1327             Psychosocial Re-Evaluation   Current issues with None Identified       Comments No concerns or barriers identified at this time, just started program.       Expected Outcomes For [name] to continue to have no barriers or psychosocial concerns while participating in pulmonary rehab.       Interventions Encouraged to attend Pulmonary Rehabilitation for the exercise       Continue Psychosocial Services  No Follow up required              Psychosocial Discharge (Final Psychosocial Re-Evaluation):  Psychosocial Re-Evaluation - 02/06/20 1327      Psychosocial Re-Evaluation   Current issues with None Identified    Comments No concerns or barriers identified at this time, just started program.    Expected Outcomes For Latima to continue to have no barriers or psychosocial concerns while participating in pulmonary rehab.    Interventions Encouraged to attend Pulmonary Rehabilitation for the exercise    Continue Psychosocial Services  No Follow up required           Education: Education Goals: Education classes will be provided on a weekly basis, covering required topics. Participant will state understanding/return demonstration of topics presented.  Learning Barriers/Preferences:  Learning Barriers/Preferences - 01/16/20 1126      Learning Barriers/Preferences   Learning Barriers None    Learning Preferences Computer/Internet           Education Topics: Risk Factor Reduction:  -Group instruction that is supported by a PowerPoint presentation. Instructor discusses the definition of a risk factor, different risk factors for pulmonary disease, and how the heart and lungs work together.     Nutrition for Pulmonary Patient:  -Group instruction provided by PowerPoint slides, verbal discussion, and written  materials to support subject matter. The instructor gives an explanation and review of healthy diet recommendations, which includes a discussion on weight management, recommendations for fruit and vegetable consumption, as well as protein, fluid, caffeine, fiber, sodium, sugar, and alcohol. Tips for eating when patients are short of breath are discussed.   Pursed Lip Breathing:  -Group instruction that is supported by demonstration and informational handouts. Instructor discusses the benefits of pursed lip and diaphragmatic   breathing and detailed demonstration on how to preform both.     Oxygen Safety:  -Group instruction provided by PowerPoint, verbal discussion, and written material to support subject matter. There is an overview of "What is Oxygen" and "Why do we need it".  Instructor also reviews how to create a safe environment for oxygen use, the importance of using oxygen as prescribed, and the risks of noncompliance. There is a brief discussion on traveling with oxygen and resources the patient may utilize.   Oxygen Equipment:  -Group instruction provided by [facility] Staff utilizing handouts, written materials, and equipment demonstrations.   Signs and Symptoms:  -Group instruction provided by written material and verbal discussion to support subject matter. Warning signs and symptoms of infection, stroke, and heart attack are reviewed and when to call the physician/911 reinforced. Tips for preventing the spread of infection discussed.   Advanced Directives:  -Group instruction provided by verbal instruction and written material to support subject matter. Instructor reviews Advanced Directive laws and proper instruction for filling out document.   Pulmonary Video:  -Group video education that reviews the importance of medication and oxygen compliance, exercise, good nutrition, pulmonary hygiene, and pursed lip and diaphragmatic breathing for the pulmonary patient.   Exercise for  the Pulmonary Patient:  -Group instruction that is supported by a PowerPoint presentation. Instructor discusses benefits of exercise, core components of exercise, frequency, duration, and intensity of an exercise routine, importance of utilizing pulse oximetry during exercise, safety while exercising, and options of places to exercise outside of rehab.     Pulmonary Medications:  -Verbally interactive group education provided by instructor with focus on inhaled medications and proper administration.   PULMONARY REHAB OTHER RESPIRATORY from 01/31/2020 in [place]  Date 01/31/20  Educator Handout      Anatomy and Physiology of the Respiratory System and Intimacy:  -Group instruction provided by PowerPoint, verbal discussion, and written material to support subject matter. Instructor reviews respiratory cycle and anatomical components of the respiratory system and their functions. Instructor also reviews differences in obstructive and restrictive respiratory diseases with examples of each. Intimacy, Sex, and Sexuality differences are reviewed with a discussion on how relationships can change when diagnosed with pulmonary disease. Common sexual concerns are reviewed.   MD DAY -A group question and answer session with a medical doctor that allows participants to ask questions that relate to their pulmonary disease state.   OTHER EDUCATION -Group or individual verbal, written, or video instructions that support the educational goals of the pulmonary rehab program.   PULMONARY REHAB OTHER RESPIRATORY from 01/31/2020 in [place]  Date 01/24/20  Educator Lucretia Roers level h/o]  Instruction Review Code --  [na]      Holiday Eating Survival Tips:  -Group instruction provided by PowerPoint slides, verbal discussion, and written materials to support subject matter. The instructor gives patients tips, tricks, and techniques to help them  not only survive but enjoy the holidays despite the onslaught of food that accompanies the holidays.   Knowledge Questionnaire Score:  Knowledge Questionnaire Score - 01/16/20 1139      Knowledge Questionnaire Score   Pre Score 16/18           Core Components/Risk Factors/Patient Goals at Admission:  Personal Goals and Risk Factors at Admission - 01/16/20 1217      Core Components/Risk Factors/Patient Goals on Admission   Improve shortness of breath with ADL's Yes    Intervention  Provide education, individualized exercise plan and daily activity instruction to help decrease symptoms of SOB with activities of daily living.    Expected Outcomes Short Term: Improve cardiorespiratory fitness to achieve a reduction of symptoms when performing ADLs;Long Term: Be able to perform more ADLs without symptoms or delay the onset of symptoms           Core Components/Risk Factors/Patient Goals Review:   Goals and Risk Factor Review    Row Name 01/16/20 1217 02/06/20 1328           Core Components/Risk Factors/Patient Goals Review   Personal Goals Review Increase knowledge of respiratory medications and ability to use respiratory devices properly.;Improve shortness of breath with ADL's;Develop more efficient breathing techniques such as purse lipped breathing and diaphragmatic breathing and practicing self-pacing with activity. Increase knowledge of respiratory medications and ability to use respiratory devices properly.;Improve shortness of breath with ADL's;Develop more efficient breathing techniques such as purse lipped breathing and diaphragmatic breathing and practicing self-pacing with activity.      Review -- Just started program, has attended 4 exercise sessions, is doing well, CBG's have been elevated 200-250 pre-exercise, PCP added glypazide.  2 mets on nustep, 2 mph/0 incline on treadmill, doing well.      Expected Outcomes -- See admission goals.             Core  Components/Risk Factors/Patient Goals at Discharge (Final Review):   Goals and Risk Factor Review - 02/06/20 1328      Core Components/Risk Factors/Patient Goals Review   Personal Goals Review Increase knowledge of respiratory medications and ability to use respiratory devices properly.;Improve shortness of breath with ADL's;Develop more efficient breathing techniques such as purse lipped breathing and diaphragmatic breathing and practicing self-pacing with activity.    Review Just started program, has attended 4 exercise sessions, is doing well, CBG's have been elevated 200-250 pre-exercise, PCP added glypazide.  2 mets on nustep, 2 mph/0 incline on treadmill, doing well.    Expected Outcomes See admission goals.           ITP Comments:   Comments: ITP REVIEW Pt is making expected progress toward pulmonary rehab goals after completing 5 sessions. Recommend continued exercise, life style modification, education, and utilization of breathing techniques to increase stamina and strength and decrease shortness of breath with exertion.   

## 2020-02-09 ENCOUNTER — Encounter (HOSPITAL_COMMUNITY)
Admission: RE | Admit: 2020-02-09 | Discharge: 2020-02-09 | Disposition: A | Discharge status: Home / Self Care | Payer: 59 | Source: Ambulatory Visit | Attending: Pulmonary Disease | Admitting: Pulmonary Disease

## 2020-02-09 ENCOUNTER — Other Ambulatory Visit: Payer: Self-pay

## 2020-02-09 DIAGNOSIS — J849 Interstitial pulmonary disease, unspecified: Secondary | ICD-10-CM

## 2020-02-09 NOTE — Progress Notes (Signed)
Daily Session Note  Patient Details  Name: [name] MRN: [id number] Date of Birth: 11-09-1975 Referring Provider:     Pulmonary Rehab Walk Test from 01/16/2020 in [place]  Referring Provider Dr. Vaughan Browner      Encounter Date: 02/09/2020  Check In:  Session Check In - 02/09/20 1256      Check-In   Supervising physician immediately available to respond to emergencies [facility] immediately available    Physician(s) Dr. Coralee Pesa    Location [facility]    Staff Present Rosebud Poles, RN, BSN;Alexandra Posadas Ysidro Evert, RN;Jessica Hassell Done, MS, ACSM-CEP, Exercise Physiologist    Virtual Visit No    Medication changes reported     No    Fall or balance concerns reported    No    Tobacco Cessation No Change    Warm-up and Cool-down Performed on first and last piece of equipment    Resistance Training Performed Yes    VAD Patient? No    PAD/SET Patient? No      Pain Assessment   Currently in Pain? No/denies    Multiple Pain Sites No           Capillary Blood Glucose: No results found for this or any previous visit (from the past 24 hour(s)).    Social History   Tobacco Use  Smoking Status Never Smoker  Smokeless Tobacco Never Used    Goals Met:  Exercise tolerated well No report of cardiac concerns or symptoms Strength training completed today  Goals Unmet:  Not Applicable  Comments: Service time is from 1250 to 1350    Dr. Fransico Him is Medical Director for Cardiac Rehab at [facility] [place].

## 2020-02-10 ENCOUNTER — Other Ambulatory Visit (HOSPITAL_COMMUNITY): Payer: 59

## 2020-02-14 ENCOUNTER — Other Ambulatory Visit: Payer: Self-pay

## 2020-02-14 ENCOUNTER — Encounter (HOSPITAL_COMMUNITY)
Admission: RE | Admit: 2020-02-14 | Discharge: 2020-02-14 | Disposition: A | Discharge status: Home / Self Care | Payer: 59 | Source: Ambulatory Visit | Attending: Pulmonary Disease | Admitting: Pulmonary Disease

## 2020-02-14 DIAGNOSIS — J849 Interstitial pulmonary disease, unspecified: Secondary | ICD-10-CM | POA: Diagnosis not present

## 2020-02-14 NOTE — Progress Notes (Signed)
Daily Session Note  Patient Details  Name: [name] MRN: [id number] Date of Birth: 1975/10/20 Referring Provider:     Pulmonary Rehab Walk Test from 01/16/2020 in [place]  Referring Provider Dr. Vaughan Browner      Encounter Date: 02/14/2020  Check In:  Session Check In - 02/14/20 [place]      Check-In   Supervising physician immediately available to respond to emergencies [facility] immediately available    Physician(s) Dr. Rodena Piety    Location [facility]    Staff Present Rosebud Poles, RN, Bjorn Loser, MS, CEP, Exercise Physiologist;Analisia Kingsford Jani Gravel, MS, ACSM-CEP, Exercise Physiologist    Virtual Visit No    Medication changes reported     No    Fall or balance concerns reported    No    Tobacco Cessation No Change    Warm-up and Cool-down Performed on first and last piece of equipment    Resistance Training Performed Yes    VAD Patient? No    PAD/SET Patient? No      Pain Assessment   Currently in Pain? No/denies    Multiple Pain Sites No           Capillary Blood Glucose: No results found for this or any previous visit (from the past 24 hour(s)).    Social History   Tobacco Use  Smoking Status Never Smoker  Smokeless Tobacco Never Used    Goals Met:  Exercise tolerated well No report of cardiac concerns or symptoms Strength training completed today  Goals Unmet:  Not Applicable  Comments: Service time is from 1248 to 1355    Dr. Fransico Him is Medical Director for Cardiac Rehab at [facility].

## 2020-02-16 ENCOUNTER — Encounter (HOSPITAL_COMMUNITY): Payer: 59

## 2020-02-17 NOTE — Telephone Encounter (Signed)
The [place] was completed, signed by Dr Isaiah Serge, and faxed by me.  [place] forms were placed in scan, where they can be seen if needed. [facility] is on Epic and should be able to see any test preformed.

## 2020-02-17 NOTE — Telephone Encounter (Signed)
Nothing further needed. I have the CD with her images.

## 2020-02-20 ENCOUNTER — Ambulatory Visit (INDEPENDENT_AMBULATORY_CARE_PROVIDER_SITE_OTHER): Payer: 59 | Admitting: Pulmonary Disease

## 2020-02-20 ENCOUNTER — Encounter: Payer: Self-pay | Admitting: Pulmonary Disease

## 2020-02-20 ENCOUNTER — Other Ambulatory Visit: Payer: Self-pay

## 2020-02-20 VITALS — BP 118/74 | HR 99 | Temp 98.0°F | Ht 66.0 in | Wt 198.2 lb

## 2020-02-20 DIAGNOSIS — R0600 Dyspnea, unspecified: Secondary | ICD-10-CM | POA: Diagnosis not present

## 2020-02-20 DIAGNOSIS — J849 Interstitial pulmonary disease, unspecified: Secondary | ICD-10-CM

## 2020-02-20 MED ORDER — SULFAMETHOXAZOLE-TRIMETHOPRIM 800-160 MG PO TABS: 1.0000 | ORAL_TABLET | Freq: Every day | ORAL | 3 refills | Status: DC

## 2020-02-20 NOTE — Progress Notes (Signed)
[name]    [id number]    07-18-1976  Primary Care Physician:Skakle, Liane Comber, DO  Referring Physician: Sueanne Margarita, DO [place],  [place] [id number]  Chief complaint: Follow-up for interstitial lung disease, hypersensitivity pneumonitis.  HPI: 44 year old with history of fibromyalgia, depression, anxiety Referred for evaluation of abnormal CT scan, ILD  Complains of increasing dyspnea with chest pressure since mid August 2020.  She had a CTA done by her primary care which did not show any embolism but showed bilateral diffuse pulmonary infiltrates.  COVID-19 test was negative.  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 Trudie Reed, rheumatology.  She had a work-up done including labs which were reportedly negative.  CTD serologies and other labs were negative as well.  She underwent bronchoscopy on 07/12/2019 with findings of lymphocytosis on BAL and granulomas. 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   She was restarted on prednisone post COVID due to worsening dyspnea and tried to taper again  At last visit on 10/26/19 prednisone was down to 5 mg but continued to have persistent symptoms with CT scan showing evolving interstitial lung disease.  Prednisone increased to 40 mg a day and surgical lung biopsy done on 12/12/19  Pets: Has dogs.  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  She has a down comforter for many years and got a  down pillow in 2019. She got rid of the down after bronchoscopy in late 2020 Smoking history: No significant smoking history Travel history: Previously lived in [place], [place].  Has been living in [place] for the past 18 years.  No significant recent travel Relevant family history: Grandfather died of lung cancer.  No other significant family history of lung disease.  Interim history:  Continues on prednisone taper.  Currently at 20 mg of prednisone. Continues to have significant dyspnea, fatigue, cough with chest congestion.  Enrolled in pulmonary rehab  Outpatient Encounter Medications as of 02/20/2020  Medication Sig  . acetaminophen (TYLENOL) 650 MG CR tablet Take 650 mg by mouth every 8 (eight) hours as needed for pain.  . DULoxetine (CYMBALTA) 60 MG capsule TAKE 1 CAPSULE BY MOUTH EVERY DAY (Patient taking differently: Take 60 mg by mouth daily. )  . levothyroxine (SYNTHROID) 125 MCG tablet Take 125 mcg by mouth daily before breakfast.  . metFORMIN (GLUCOPHAGE-XR) 500 MG 24 hr tablet Take 1,000 mg by mouth at bedtime. 500 in AM and 1000 at bedtime  . pantoprazole (PROTONIX) 40 MG tablet Take 2 tablets (80 mg total) by mouth daily.  . predniSONE (DELTASONE) 20 MG tablet TAKE 2 TABLETS (40 MG TOTAL) BY MOUTH DAILY WITH BREAKFAST.  Marland Kitchen predniSONE (DELTASONE) 5 MG tablet As directed by provider  . pregabalin (LYRICA) 150 MG capsule Take 1 capsule (150 mg total) by mouth 2 (two) times daily.  . [DISCONTINUED] levothyroxine (SYNTHROID) 137 MCG tablet TAKE 1 TABLET (137 MCG TOTAL) BY MOUTH DAILY BEFORE BREAKFAST. (Patient taking differently: Take 125 mcg by mouth daily  before breakfast. )  . furosemide (LASIX) 20 MG tablet Take 1 tablet (20 mg total) by mouth daily.  Marland Kitchen ibuprofen (ADVIL) 200 MG tablet Take 1 tablet (200 mg total) by mouth every 6 (six) hours as needed. (Patient not taking: Reported on 01/16/2020)  . sulfamethoxazole-trimethoprim (BACTRIM DS) 800-160 MG tablet Take 1  tablet by mouth 3 (three) times a week. (Patient taking differently: Take 1 tablet by mouth every Tuesday, Thursday, and Saturday at 6 PM. )   No facility-administered encounter medications on file as of 02/20/2020.   Physical Exam: Blood pressure 118/74, pulse 99, temperature 98 F (36.7 C), temperature source Oral, height _0  (1.676 m), weight 198 lb 3.2 oz (89.9 kg), SpO2 97 %. Gen:      No acute distress HEENT:  EOMI, sclera anicteric Neck:     No masses; no thyromegaly Lungs:    Clear to auscultation bilaterally; normal respiratory effort CV:         Regular rate and rhythm; no murmurs Abd:      + bowel sounds; soft, non-tender; no palpable masses, no distension Ext:    No edema; adequate peripheral perfusion Skin:      Warm and dry; no rash Neuro: alert and oriented x 3 Psych: normal mood and affect  Data Reviewed: Imaging: CTA 06/15/2019-no pulmonary embolism, mild diffuse interstitial prominence with bilateral groundglass and nodular airspace opacities.   High-res CT 07/11/2019-Bilateral fine nodularity with groundglass with no significant air trapping.  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 High-res CT 11/02/2019-subpleural reticulation with groundglass, traction bronchiectasis no basal gradient.  Indeterminate for UIP. I have reviewed the images personally.  PFTs: [facility] 11/04/2019 FVC 2.53 [68%], FEV1 2.26 [75%], F/F 89, SVC 1.75 [50%], DLCO 14.61 [65%] Moderate restriction, diffusion defect.  Labs: ANA 03/25/2019-negative,  Rheumatoid factor 03/25/2019- < 14 Repeat CTD serologies 07/18/2019-negative, hypersensitivity panel-negative  CBC 06/15/2019-WBC 5.87, eos 6.4%, absolute eosinophil count 299 Metabolic panel 24/26/8341-DQQIWL normal limits D-dimer 06/30/2019-0.41 BNP 06/30/2019-10.   Bronchoscopy 07/12/2019 WBC 510, lymphocytes 38%, eos 12%, Microbiology-negative Cytology -no malignancy Transbronchial biopsy-nonnecrotizing epithelioid granulomas SARS-CoV-2, viral  PCR-negative  Surgical lung biopsy 12/12/2019- features suggestive of chronic hypersensitivity pneumonitis with heterogeneous fibrosis and superimposed subacute lung injury.  Cardiac: Echocardiogram  07/04/2019-LVEF 65-70%, normal RV systolic function, normal pulmonary artery systolic pressure 7/98/9211-HERD 40-81%, grade 1 diastolic dysfunction normal RV systolic function  Assessment:  Interstitial lung disease, hypersensitivity pneumonitis Post COVID-19 She had initial diagnosis of hypersensitivity pneumonitis with moderate confidence based on bronchoscopy which showed lymphocytes, granulomas on transbronchial biopsies and exposure to down, discussed at ILD conference in December 2020 (Raghu G, [place] 202 (3), Aug 2020)  Initially improved on prednisone but symptoms worsened after COVID-19 infection in January 2021 It is possible that COVID-19 pneumonia may have worsened her ILD. We proceeded with lung biopsy since there was concern from the patient about IPF and there was also a question of NSIP fibrosis based on CT scan findings and possible autoimmune disease though she has negative serologies and no symptoms of connective tissue process.  I have reviewed the pathology in detail with Dr. Vic Ripper and at multidisciplinary conference on 01/03/2020.  She has findings of chronic airway centric fibrosis and heterogeneous fibrosis suggestive of fibrotic HP with superimposed organizing pneumonia pattern which may be from COVID-19.  The surgical biopsy was useful since there is no evidence of NSIP fibrosis suggestive of connective tissue disease.  The heterogeneous fibrosis suggests UIP fibrosis but I cannot give her a diagnosis of IPF at  this point unless there is progression. I do not see any need for stronger immunosuppression but if we cannot get her off the prednisone then we may reconsider and if there is progressive fibrosis then start nintedanib.  My impression at this point is that she was  recovering from hypersensitivity pneumonitis in Jan with prednisone and then she got a second hit with COVID-19 causing lung injury and long-haul syndrome. It is unclear to me at this point if she may develop into a progressive fibrosing interstitial lung disease [address] 544, Sept 2020)  Currently at prednisone 20 mg a day and Bactrim 3 times a week for pneumocystis prophylaxis We have been tapering prednisone by 2.5 mg every 2 weeks.  Will increase the taper rate by 5 mg every 2 weeks  She has a second opinion appointment at [facility] this week.  Intermittent lower extremity edema May be secondary to diastolic heart failure.  No evidence of pulmonary hypertension on echocardiogram Discussed right heart catheterization for better evaluation but patient is reluctant to undergo at this point  This appointment required 45 minutes of patient care (this includes precharting, chart review, review of results, face-to-face care, etc.).  Plan/Recommendations: - Continue prednisone with slow taper, bactrim - Continue pulmonary rehab - Second opinion at [facility].   Follow up in 3 months  Marshell Garfinkel MD [facility] 02/20/2020, 9:51 AM  CC: Sueanne Margarita, DO

## 2020-02-20 NOTE — Patient Instructions (Signed)
Continue tapering the steroids as instructed by 5 mg every 2 weeks Continue pulmonary rehab  Follow up in 3 months

## 2020-02-21 ENCOUNTER — Encounter: Payer: Self-pay | Admitting: Pulmonary Disease

## 2020-02-21 ENCOUNTER — Encounter (HOSPITAL_COMMUNITY)
Admission: RE | Admit: 2020-02-21 | Discharge: 2020-02-21 | Disposition: A | Discharge status: Home / Self Care | Payer: 59 | Source: Ambulatory Visit | Attending: Pulmonary Disease | Admitting: Pulmonary Disease

## 2020-02-21 VITALS — Wt 199.1 lb

## 2020-02-21 DIAGNOSIS — J849 Interstitial pulmonary disease, unspecified: Secondary | ICD-10-CM

## 2020-02-21 NOTE — Progress Notes (Signed)
   Interstitial Lung Disease Multidisciplinary Conference   [name]    MRN [id number]    DOB 05/19/1976  Primary Care Physician:Skakle, Eliberto Ivory, DO  Referring Physician: Chilton Greathouse MD  Time of Conference: 7.30am- 8.30am Date of conference: 01/03/2020 Location of Conference: -  Virtual  Participating Pulmonary: Dr. Kalman Shan, MD ,  Dr Chilton Greathouse, MD  Pathology: Dr Holley Bouche, MD   Radiology: Dr Lauralyn Primes Others:   Brief History:  44 year old with prior diagnosis of chronic HP, down comforter on prednisone taper Subsequently developed COVID-19 in January with worsening symptoms.  Underwent surgical lung biopsy on 12/12/2019. Re discussed at multidisciplinary conference to review pathology.  Serology:  ANA 03/25/2019-negative,  Rheumatoid factor 03/25/2019- < 14 Repeat CTD serologies 07/18/2019-negative, hypersensitivity panel-negative  MDD discussion of CT scan   High-resolution CTs 07/01/2019, 11/02/2019, 01/27/2020 reviewed Mild pulmonary fibrosis with slight basilar gradient with irregular peripheral interstitial opacities, groundglass with minimal bronchiolectasis.  Indeterminate pattern by ATS criteria.  Pathology discussion of biopsy :  Upper lobe biopsy shows chronic airway centric fibrosis Mid lung biopsy shows evidence of organizing pneumonia pattern with subacute injury There is heterogeneous fibrosis in the lower lobes with no evidence of honeycombing  MDD Impression/Recs:  Overall pattern is not clearly diagnostic of a specific etiology.  There is no evidence of granulomatous or NSIP fibrosis.    Our impression is this either represents  Chronic HP with overlapping subacute lung injury from COVID-19 infection or UIP pattern pulmonary fibrosis with overlapping subacute injury from COVID-19 infection.  Fibrosis especially in the lower lobes is thought to be UIP-like due to heterogeneous appearance interspersed with normal lung tissue but no  evidence of honeycombing to give a definitive UIP pattern  Time Spent in preparation and discussion:  > 30 min   SIGNATURE   Jolayne Branson MD [facility] Please see [email or web address] for pager details.  02/21/2020, 1:09 PM

## 2020-02-21 NOTE — Progress Notes (Signed)
Daily Session Note  Patient Details  Name: [name] MRN: [id number] Date of Birth: 01-03-1976 Referring Provider:     Pulmonary Rehab Walk Test from 01/16/2020 in [place]  Referring Provider Dr. Vaughan Browner      Encounter Date: 02/21/2020  Check In:  Session Check In - 02/21/20 1248      Check-In   Supervising physician immediately available to respond to emergencies [facility] immediately available    Physician(s) Dr. Barth Kirks    Location [facility]    Staff Present Rosebud Poles, RN, Bjorn Loser, MS, CEP, Exercise Physiologist;Shalaunda Weatherholtz Jani Gravel, MS, ACSM-CEP, Exercise Physiologist    Virtual Visit No    Medication changes reported     No    Fall or balance concerns reported    No    Tobacco Cessation No Change    Warm-up and Cool-down Performed on first and last piece of equipment    Resistance Training Performed Yes    VAD Patient? No    PAD/SET Patient? No      Pain Assessment   Currently in Pain? No/denies    Multiple Pain Sites No           Capillary Blood Glucose: No results found for this or any previous visit (from the past 24 hour(s)).  POCT Glucose - 02/21/20 1500      POCT Blood Glucose   Pre-Exercise 151 mg/dL    Post-Exercise 186 mg/dL           Exercise Prescription Changes - 02/21/20 1500      Response to Exercise   Blood Pressure (Admit) 120/80    Blood Pressure (Exercise) 148/80    Blood Pressure (Exit) 118/70    Heart Rate (Admit) 118 bpm    Heart Rate (Exercise) 127 bpm    Heart Rate (Exit) 118 bpm    Oxygen Saturation (Admit) 98 %    Oxygen Saturation (Exercise) 96 %    Oxygen Saturation (Exit) 99 %    Rating of Perceived Exertion (Exercise) 11    Perceived Dyspnea (Exercise) 2    Duration Progress to 45 minutes of aerobic exercise without signs/symptoms of physical distress    Intensity THRR unchanged      Progression   Progression Continue to progress  workloads to maintain intensity without signs/symptoms of physical distress.      Resistance Training   Training Prescription Yes    Weight orange bands    Reps 10-15    Time 10 Minutes      Treadmill   MPH 2    Grade 0    Minutes 15      T5 Nustep   Level 3    SPM 80    Minutes 15    METs 1.9           Social History   Tobacco Use  Smoking Status Never Smoker  Smokeless Tobacco Never Used    Goals Met:  Exercise tolerated well No report of cardiac concerns or symptoms Strength training completed today  Goals Unmet:  Not Applicable  Comments: Service time is from 1255 to 1400    Dr. Fransico Him is Medical Director for Cardiac Rehab at [facility].

## 2020-02-22 NOTE — Telephone Encounter (Signed)
I will place form in Dr. Shirlee More folder.

## 2020-02-23 ENCOUNTER — Encounter (HOSPITAL_COMMUNITY)
Admission: RE | Admit: 2020-02-23 | Discharge: 2020-02-23 | Disposition: A | Discharge status: Home / Self Care | Payer: 59 | Source: Ambulatory Visit | Attending: Pulmonary Disease | Admitting: Pulmonary Disease

## 2020-02-23 ENCOUNTER — Telehealth: Payer: Self-pay | Admitting: Pulmonary Disease

## 2020-02-23 ENCOUNTER — Other Ambulatory Visit: Payer: Self-pay

## 2020-02-23 DIAGNOSIS — J849 Interstitial pulmonary disease, unspecified: Secondary | ICD-10-CM | POA: Diagnosis not present

## 2020-02-23 NOTE — Telephone Encounter (Signed)
Forms in [name] mailbox to sign.

## 2020-02-23 NOTE — Progress Notes (Signed)
Daily Session Note  Patient Details  Name: [name] MRN: [id number] Date of Birth: July 16, 1976 Referring Provider:     Pulmonary Rehab Walk Test from 01/16/2020 in [place]  Referring Provider Dr. Vaughan Browner      Encounter Date: 02/23/2020  Check In:  Session Check In - 02/23/20 1250      Check-In   Supervising physician immediately available to respond to emergencies [facility] immediately available    Physician(s) Dr. Rodena Piety    Location [facility]    Staff Present Rosebud Poles, RN, Bjorn Loser, MS, CEP, Exercise Physiologist;Panayiota Larkin Jani Gravel, MS, ACSM-CEP, Exercise Physiologist    Virtual Visit No    Medication changes reported     No    Fall or balance concerns reported    No    Tobacco Cessation No Change    Warm-up and Cool-down Performed on first and last piece of equipment    Resistance Training Performed Yes    VAD Patient? No    PAD/SET Patient? No      Pain Assessment   Currently in Pain? No/denies    Multiple Pain Sites No           Capillary Blood Glucose: No results found for this or any previous visit (from the past 24 hour(s)).    Social History   Tobacco Use  Smoking Status Never Smoker  Smokeless Tobacco Never Used    Goals Met:  Exercise tolerated well No report of cardiac concerns or symptoms Strength training completed today  Goals Unmet:  Not Applicable  Comments: Service time is from 1250 to 1345    Dr. Fransico Him is Medical Director for Cardiac Rehab at [facility].

## 2020-02-23 NOTE — Telephone Encounter (Signed)
Pt returning missed call. Can be reached at [phone]

## 2020-02-23 NOTE — Telephone Encounter (Signed)
Waiting for email from [place], hold in triage. [name] will print.

## 2020-02-23 NOTE — Telephone Encounter (Signed)
Called and left message for patient to return call.  

## 2020-02-24 NOTE — Telephone Encounter (Signed)
Completed form signed by Dr Isaiah Serge, faxed to [facility]. Called and updated Patient. Nothing further at this time.

## 2020-02-27 ENCOUNTER — Encounter: Payer: Self-pay | Admitting: Primary Care

## 2020-02-28 ENCOUNTER — Telehealth (HOSPITAL_COMMUNITY): Payer: Self-pay | Admitting: Internal Medicine

## 2020-02-28 ENCOUNTER — Encounter (HOSPITAL_COMMUNITY): Payer: 59

## 2020-03-01 ENCOUNTER — Other Ambulatory Visit: Payer: Self-pay

## 2020-03-01 ENCOUNTER — Encounter (HOSPITAL_COMMUNITY)
Admission: RE | Admit: 2020-03-01 | Discharge: 2020-03-01 | Disposition: A | Discharge status: Home / Self Care | Payer: 59 | Source: Ambulatory Visit | Attending: Pulmonary Disease | Admitting: Pulmonary Disease

## 2020-03-01 DIAGNOSIS — J849 Interstitial pulmonary disease, unspecified: Secondary | ICD-10-CM | POA: Diagnosis not present

## 2020-03-01 NOTE — Progress Notes (Signed)
Daily Session Note  Patient Details  Name: [name] MRN: [id number] Date of Birth: 1975/12/12 Referring Provider:     Pulmonary Rehab Walk Test from 01/16/2020 in [place]  Referring Provider Dr. Vaughan Browner      Encounter Date: 03/01/2020  Check In:  Session Check In - 03/01/20 1247      Check-In   Supervising physician immediately available to respond to emergencies [facility] immediately available    Physician(s) Dr. Sloan Leiter    Location [facility]    Staff Present Rosebud Poles, RN, Bjorn Loser, MS, CEP, Exercise Physiologist;Lisa Ysidro Evert, RN    Virtual Visit No    Medication changes reported     No    Fall or balance concerns reported    No    Tobacco Cessation No Change    Warm-up and Cool-down Performed on first and last piece of equipment    Resistance Training Performed Yes    VAD Patient? No    PAD/SET Patient? No      Pain Assessment   Currently in Pain? No/denies    Multiple Pain Sites No           Capillary Blood Glucose: No results found for this or any previous visit (from the past 24 hour(s)).    Social History   Tobacco Use  Smoking Status Never Smoker  Smokeless Tobacco Never Used    Goals Met:  Proper associated with RPD/PD & O2 Sat Exercise tolerated well Strength training completed today  Goals Unmet:  Not Applicable  Comments: Service time is from 1250 to [place]    Dr. Fransico Him is Medical Director for Cardiac Rehab at [facility].

## 2020-03-06 ENCOUNTER — Other Ambulatory Visit: Payer: Self-pay

## 2020-03-06 ENCOUNTER — Encounter (HOSPITAL_COMMUNITY)
Admission: RE | Admit: 2020-03-06 | Discharge: 2020-03-06 | Disposition: A | Discharge status: Home / Self Care | Payer: 59 | Source: Ambulatory Visit | Attending: Pulmonary Disease | Admitting: Pulmonary Disease

## 2020-03-06 VITALS — Wt 197.8 lb

## 2020-03-06 DIAGNOSIS — J849 Interstitial pulmonary disease, unspecified: Secondary | ICD-10-CM | POA: Diagnosis not present

## 2020-03-06 NOTE — Progress Notes (Signed)
Daily Session Note  Patient Details  Name: [name] MRN: [id number] Date of Birth: 13-Nov-1975 Referring Provider:     Pulmonary Rehab Walk Test from 01/16/2020 in [place]  Referring Provider Dr. Vaughan Browner      Encounter Date: 03/06/2020  Check In:  Session Check In - 03/06/20 1321      Check-In   Supervising physician immediately available to respond to emergencies [facility] immediately available    Physician(s) Dr. Sloan Leiter    Location [facility]    Staff Present Rosebud Poles, RN, Deland Pretty, MS, ACSM CEP, Exercise Physiologist;Dalton Kris Mouton, MS, CEP, Exercise Physiologist    Virtual Visit No    Medication changes reported     No    Fall or balance concerns reported    No    Tobacco Cessation No Change    Warm-up and Cool-down Performed as group-led instruction    Resistance Training Performed Yes    VAD Patient? No    PAD/SET Patient? No      Pain Assessment   Currently in Pain? No/denies    Multiple Pain Sites No           Capillary Blood Glucose: No results found for this or any previous visit (from the past 24 hour(s)).  POCT Glucose - 03/06/20 1438      POCT Blood Glucose   Pre-Exercise 109 mg/dL    Post-Exercise 174 mg/dL           Exercise Prescription Changes - 03/06/20 1400      Response to Exercise   Blood Pressure (Admit) 144/86    Blood Pressure (Exercise) 140/82    Blood Pressure (Exit) 126/80    Heart Rate (Admit) 115 bpm    Heart Rate (Exercise) 125 bpm    Heart Rate (Exit) 115 bpm    Oxygen Saturation (Admit) 96 %    Oxygen Saturation (Exercise) 95 %    Oxygen Saturation (Exit) 98 %    Rating of Perceived Exertion (Exercise) 10    Perceived Dyspnea (Exercise) 2    Duration Continue with 30 min of aerobic exercise without signs/symptoms of physical distress.    Intensity THRR unchanged      Progression   Progression Continue to progress workloads to maintain  intensity without signs/symptoms of physical distress.      Resistance Training   Training Prescription Yes    Weight orange bands    Reps 10-15    Time 10 Minutes      Treadmill   MPH 2    Grade 1    Minutes 15      T5 Nustep   Level 3    SPM 80    Minutes 15    METs 2.4           Social History   Tobacco Use  Smoking Status Never Smoker  Smokeless Tobacco Never Used    Goals Met:  Proper associated with RPD/PD & O2 Sat Exercise tolerated well Strength training completed today  Goals Unmet:  Not Applicable  Comments: Service time is from 1300 to 1400    Dr. Fransico Him is Medical Director for Cardiac Rehab at [facility].

## 2020-03-08 ENCOUNTER — Other Ambulatory Visit: Payer: Self-pay

## 2020-03-08 ENCOUNTER — Encounter (HOSPITAL_COMMUNITY)
Admission: RE | Admit: 2020-03-08 | Discharge: 2020-03-08 | Disposition: A | Discharge status: Home / Self Care | Payer: 59 | Source: Ambulatory Visit | Attending: Pulmonary Disease | Admitting: Pulmonary Disease

## 2020-03-08 DIAGNOSIS — J849 Interstitial pulmonary disease, unspecified: Secondary | ICD-10-CM | POA: Diagnosis not present

## 2020-03-08 NOTE — Progress Notes (Signed)
Daily Session Note  Patient Details  Name: [name] MRN: [id number] Date of Birth: [birth year] Referring Provider:     Pulmonary Rehab Walk Test from 01/16/2020 in [place]  Referring Provider Dr. Vaughan Browner      Encounter Date: 03/08/2020  Check In:  Session Check In - 03/08/20 1255      Check-In   Supervising physician immediately available to respond to emergencies [facility] immediately available    Physician(s) Dr. Sloan Leiter    Location [facility]    Staff Present Rosebud Poles, RN, Bjorn Loser, MS, CEP, Exercise Physiologist;Lisa Jani Gravel, MS, ACSM-CEP, Exercise Physiologist    Virtual Visit No    Medication changes reported     No    Fall or balance concerns reported    No    Tobacco Cessation No Change    Warm-up and Cool-down Performed on first and last piece of equipment    Resistance Training Performed Yes    VAD Patient? No    PAD/SET Patient? No      Pain Assessment   Currently in Pain? No/denies    Multiple Pain Sites No           Capillary Blood Glucose: No results found for this or any previous visit (from the past 24 hour(s)).    Social History   Tobacco Use  Smoking Status Never Smoker  Smokeless Tobacco Never Used    Goals Met:  Proper associated with RPD/PD & O2 Sat Exercise tolerated well Strength training completed today  Goals Unmet:  Not Applicable  Comments: Service time is from 1255 to 1400.    Dr. Fransico Him is Medical Director for Cardiac Rehab at [facility].

## 2020-03-08 NOTE — Progress Notes (Signed)
Pulmonary Individual Treatment Plan  Patient Details  Name: [name] MRN: [id number] Date of Birth: February 28, 1976 Referring Provider:     Pulmonary Rehab Walk Test from 01/16/2020 in [facility]  Referring Provider Dr. Vaughan Browner      Initial Encounter Date:    Pulmonary Rehab Walk Test from 01/16/2020 in [facility]  Date 01/16/20      Visit Diagnosis: Interstitial lung disease ([place])  Patient's Home Medications on Admission:   Current Outpatient Medications:  .  acetaminophen (TYLENOL) 650 MG CR tablet, Take 650 mg by mouth every 8 (eight) hours as needed for pain., Disp: , Rfl:  .  DULoxetine (CYMBALTA) 60 MG capsule, TAKE 1 CAPSULE BY MOUTH EVERY DAY (Patient taking differently: Take 60 mg by mouth daily. ), Disp: 90 capsule, Rfl: 2 .  furosemide (LASIX) 20 MG tablet, Take 1 tablet (20 mg total) by mouth daily., Disp: 30 tablet, Rfl: 1 .  glipiZIDE (GLUCOTROL) 5 MG tablet, Take 5 mg by mouth daily., Disp: , Rfl:  .  ibuprofen (ADVIL) 200 MG tablet, Take 1 tablet (200 mg total) by mouth every 6 (six) hours as needed. (Patient not taking: Reported on 01/16/2020), Disp: 30 tablet, Rfl: 0 .  levothyroxine (SYNTHROID) 125 MCG tablet, Take 125 mcg by mouth daily before breakfast., Disp: , Rfl:  .  metFORMIN (GLUCOPHAGE-XR) 500 MG 24 hr tablet, Take 1,000 mg by mouth at bedtime. 500 in AM and 1000 at bedtime, Disp: , Rfl:  .  pantoprazole (PROTONIX) 40 MG tablet, Take 2 tablets (80 mg total) by mouth daily., Disp: 30 tablet, Rfl: 11 .  predniSONE (DELTASONE) 20 MG tablet, TAKE 2 TABLETS (40 MG TOTAL) BY MOUTH DAILY WITH BREAKFAST., Disp: 60 tablet, Rfl: 2 .  predniSONE (DELTASONE) 5 MG tablet, As directed by provider, Disp: 30 tablet, Rfl: 1 .  pregabalin (LYRICA) 150 MG capsule, Take 1 capsule (150 mg total) by mouth 2 (two) times daily., Disp: 60 capsule, Rfl: 3 .  sulfamethoxazole-trimethoprim (BACTRIM DS) 800-160 MG tablet, Take  1 tablet by mouth daily., Disp: 12 tablet, Rfl: 3  Past Medical History: Past Medical History:  Diagnosis Date  . Anxiety   . Arthritis   . Depression   . Diabetes mellitus without complication ([place])   . Dyspnea   . Fibromyalgia   . GERD (gastroesophageal reflux disease)   . Headache    migraines  . Hypothyroidism 1999   post PTU Rx for hyperthyroidism  . IBS (irritable bowel syndrome)   . Interstitial lung disease ([place])   . Pneumonia 06/2019   Double PNA    Tobacco Use: Social History   Tobacco Use  Smoking Status Never Smoker  Smokeless Tobacco Never Used    Labs: Recent Review Flowsheet Data    Labs for ITP Cardiac and Pulmonary Rehab Latest Ref Rng & Units 06/24/2018 03/25/2019 11/01/2019 12/02/2019 12/08/2019   Cholestrol 0 - 200 mg/dL - - - - -   LDLCALC 0 - 99 mg/dL - - - - -   HDL >39.00 mg/dL - - - - -   Trlycerides 0 - 149 mg/dL - - - - -   Hemoglobin A1c 4.6 - 6.5 % 5.9 5.9(H) 7.7(H) 8.6(H) -   PHART 7.35 - 7.45 - - - - 7.513(H)   PCO2ART 32 - 48 mmHg - - - - 33.9   HCO3 20.0 - 28.0 mmol/L - - - - 27.1   O2SAT % - - - -  96.5      Capillary Blood Glucose: Lab Results  Component Value Date   GLUCAP 278 (H) 12/14/2019   GLUCAP 153 (H) 12/14/2019   GLUCAP 228 (H) 12/13/2019   GLUCAP 266 (H) 12/13/2019   GLUCAP 256 (H) 12/13/2019    POCT Glucose    Row Name 01/24/20 1505 01/26/20 1431 02/21/20 1500 03/06/20 1438       POCT Blood Glucose   Pre-Exercise 254 mg/dL 156 mg/dL 151 mg/dL 109 mg/dL    Post-Exercise 236 mg/dL 280 mg/dL 186 mg/dL 174 mg/dL           Pulmonary Assessment Scores:  Pulmonary Assessment Scores    Row Name 01/16/20 1144 01/16/20 1203       ADL UCSD   ADL Phase Entry Entry    SOB Score total 75 -      CAT Score   CAT Score 24 -      mMRC Score   mMRC Score - 4          UCSD: Self-administered rating of dyspnea associated with activities of daily living (ADLs) 6-point scale (0 = "not at all" to 5 = "maximal or  unable to do because of breathlessness")  Scoring Scores range from 0 to 120.  Minimally important difference is 5 units  CAT: CAT can identify the health impairment of COPD patients and is better correlated with disease progression.  CAT has a scoring range of zero to 40. The CAT score is classified into four groups of low (less than 10), medium (10 - 20), high (21-30) and very high (31-40) based on the impact level of disease on health status. A CAT score over 10 suggests significant symptoms.  A worsening CAT score could be explained by an exacerbation, poor medication adherence, poor inhaler technique, or progression of COPD or comorbid conditions.  CAT MCID is 2 points  mMRC: mMRC (Modified Medical Research Council) Dyspnea Scale is used to assess the degree of baseline functional disability in patients of respiratory disease due to dyspnea. No minimal important difference is established. A decrease in score of 1 point or greater is considered a positive change.   Pulmonary Function Assessment:  Pulmonary Function Assessment - 01/16/20 1124      Breath   Bilateral Breath Sounds Clear    Shortness of Breath Limiting activity;Yes           Exercise Target Goals: Exercise Program Goal: Individual exercise prescription set using results from initial 6 min walk test and THRR while considering  patient's activity barriers and safety.   Exercise Prescription Goal: Initial exercise prescription builds to 30-45 minutes a day of aerobic activity, 2-3 days per week.  Home exercise guidelines will be given to patient during program as part of exercise prescription that the participant will acknowledge.  Activity Barriers & Risk Stratification:  Activity Barriers & Cardiac Risk Stratification - 01/16/20 1119      Activity Barriers & Cardiac Risk Stratification   Activity Barriers Deconditioning;Muscular Weakness;Shortness of Breath           6 Minute Walk:  6 Minute Walk    Row Name  01/16/20 1203         6 Minute Walk   Phase Initial     Distance 1254 feet     Walk Time 6 minutes     # of Rest Breaks 0     MPH 2.38     METS 4.27     RPE 12  Perceived Dyspnea  3     VO2 Peak 14.93     Symptoms No     Resting HR 97 bpm     Resting BP 110/60     Resting Oxygen Saturation  98 %     Exercise Oxygen Saturation  during 6 min walk 93 %     Max Ex. HR 133 bpm     Max Ex. BP 134/80     2 Minute Post BP 124/74       Interval HR   1 Minute HR 104     2 Minute HR 106     3 Minute HR 120     4 Minute HR 114     5 Minute HR 114     6 Minute HR 133     2 Minute Post HR 92     Interval Heart Rate? Yes       Interval Oxygen   Interval Oxygen? Yes     Baseline Oxygen Saturation % 98 %     1 Minute Oxygen Saturation % 96 %     1 Minute Liters of Oxygen 0 L     2 Minute Oxygen Saturation % 93 %     2 Minute Liters of Oxygen 0 L     3 Minute Oxygen Saturation % 95 %     3 Minute Liters of Oxygen 0 L     4 Minute Oxygen Saturation % 97 %     4 Minute Liters of Oxygen 0 L     5 Minute Oxygen Saturation % 94 %     5 Minute Liters of Oxygen 0 L     6 Minute Oxygen Saturation % 95 %     6 Minute Liters of Oxygen 0 L     2 Minute Post Oxygen Saturation % 98 %     2 Minute Post Liters of Oxygen 0 L            Oxygen Initial Assessment:  Oxygen Initial Assessment - 01/16/20 1202      Home Oxygen   Home Oxygen Device None    Sleep Oxygen Prescription None    Home Exercise Oxygen Prescription None    Home at Rest Exercise Oxygen Prescription None    Compliance with Home Oxygen Use Yes      Initial 6 min Walk   Oxygen Used None      Program Oxygen Prescription   Program Oxygen Prescription None      Intervention   Short Term Goals To learn and exhibit compliance with exercise, home and travel O2 prescription;To learn and understand importance of monitoring SPO2 with pulse oximeter and demonstrate accurate use of the pulse oximeter.;To learn and  understand importance of maintaining oxygen saturations>88%;To learn and demonstrate proper pursed lip breathing techniques or other breathing techniques.;To learn and demonstrate proper use of respiratory medications    Long  Term Goals Exhibits compliance with exercise, home and travel O2 prescription;Verbalizes importance of monitoring SPO2 with pulse oximeter and return demonstration;Maintenance of O2 saturations>88%;Exhibits proper breathing techniques, such as pursed lip breathing or other method taught during program session;Compliance with respiratory medication;Demonstrates proper use of MDI's           Oxygen Re-Evaluation:  Oxygen Re-Evaluation    Row Name 02/07/20 1551 03/06/20 0743           Program Oxygen Prescription   Program Oxygen Prescription None None  Home Oxygen   Home Oxygen Device None None      Sleep Oxygen Prescription None None      Home Exercise Oxygen Prescription None None      Home at Rest Exercise Oxygen Prescription None None      Compliance with Home Oxygen Use Yes Yes        Goals/Expected Outcomes   Short Term Goals To learn and exhibit compliance with exercise, home and travel O2 prescription;To learn and understand importance of monitoring SPO2 with pulse oximeter and demonstrate accurate use of the pulse oximeter.;To learn and understand importance of maintaining oxygen saturations>88%;To learn and demonstrate proper pursed lip breathing techniques or other breathing techniques.;To learn and demonstrate proper use of respiratory medications To learn and exhibit compliance with exercise, home and travel O2 prescription;To learn and understand importance of monitoring SPO2 with pulse oximeter and demonstrate accurate use of the pulse oximeter.;To learn and understand importance of maintaining oxygen saturations>88%;To learn and demonstrate proper pursed lip breathing techniques or other breathing techniques.;To learn and demonstrate proper use  of respiratory medications      Long  Term Goals Exhibits compliance with exercise, home and travel O2 prescription;Verbalizes importance of monitoring SPO2 with pulse oximeter and return demonstration;Maintenance of O2 saturations>88%;Exhibits proper breathing techniques, such as pursed lip breathing or other method taught during program session;Compliance with respiratory medication;Demonstrates proper use of MDI's Exhibits compliance with exercise, home and travel O2 prescription;Verbalizes importance of monitoring SPO2 with pulse oximeter and return demonstration;Maintenance of O2 saturations>88%;Exhibits proper breathing techniques, such as pursed lip breathing or other method taught during program session;Compliance with respiratory medication;Demonstrates proper use of MDI's      Goals/Expected Outcomes compliance compliance             Oxygen Discharge (Final Oxygen Re-Evaluation):  Oxygen Re-Evaluation - 03/06/20 0743      Program Oxygen Prescription   Program Oxygen Prescription None      Home Oxygen   Home Oxygen Device None    Sleep Oxygen Prescription None    Home Exercise Oxygen Prescription None    Home at Rest Exercise Oxygen Prescription None    Compliance with Home Oxygen Use Yes      Goals/Expected Outcomes   Short Term Goals To learn and exhibit compliance with exercise, home and travel O2 prescription;To learn and understand importance of monitoring SPO2 with pulse oximeter and demonstrate accurate use of the pulse oximeter.;To learn and understand importance of maintaining oxygen saturations>88%;To learn and demonstrate proper pursed lip breathing techniques or other breathing techniques.;To learn and demonstrate proper use of respiratory medications    Long  Term Goals Exhibits compliance with exercise, home and travel O2 prescription;Verbalizes importance of monitoring SPO2 with pulse oximeter and return demonstration;Maintenance of O2 saturations>88%;Exhibits  proper breathing techniques, such as pursed lip breathing or other method taught during program session;Compliance with respiratory medication;Demonstrates proper use of MDI's    Goals/Expected Outcomes compliance           Initial Exercise Prescription:  Initial Exercise Prescription - 01/16/20 1200      Date of Initial Exercise RX and Referring Provider   Date 01/16/20    Referring Provider Dr. Vaughan Browner      Treadmill   MPH 2    Grade 0    Minutes 15      T5 Nustep   Level 2    SPM 80    Minutes 15      Prescription Details   Frequency (times  per week) 2    Duration Progress to 30 minutes of continuous aerobic without signs/symptoms of physical distress      Intensity   THRR 40-80% of Max Heartrate 70-141    Ratings of Perceived Exertion 11-13    Perceived Dyspnea 0-4      Progression   Progression Continue to progress workloads to maintain intensity without signs/symptoms of physical distress.      Resistance Training   Training Prescription Yes    Weight orange bands    Reps 10-15           Perform Capillary Blood Glucose checks as needed.  Exercise Prescription Changes:  Exercise Prescription Changes    Row Name 02/02/20 1500 02/07/20 1400 02/21/20 1500 03/06/20 1400       Response to Exercise   Blood Pressure (Admit) - 122/80 120/80 144/86    Blood Pressure (Exercise) - 142/82 148/80 140/82    Blood Pressure (Exit) - 128/76 118/70 126/80    Heart Rate (Admit) - 104 bpm 118 bpm 115 bpm    Heart Rate (Exercise) - 122 bpm 127 bpm 125 bpm    Heart Rate (Exit) - 111 bpm 118 bpm 115 bpm    Oxygen Saturation (Admit) - 97 % 98 % 96 %    Oxygen Saturation (Exercise) - 96 % 96 % 95 %    Oxygen Saturation (Exit) - 98 % 99 % 98 %    Rating of Perceived Exertion (Exercise) - _0 Perceived Dyspnea (Exercise) - _1 Duration - Continue with 30 min of aerobic exercise without signs/symptoms of physical distress. Progress to 45 minutes of aerobic  exercise without signs/symptoms of physical distress Continue with 30 min of aerobic exercise without signs/symptoms of physical distress.    Intensity - -  40-80% HRR THRR unchanged THRR unchanged      Progression   Progression - Continue to progress workloads to maintain intensity without signs/symptoms of physical distress. Continue to progress workloads to maintain intensity without signs/symptoms of physical distress. Continue to progress workloads to maintain intensity without signs/symptoms of physical distress.      Resistance Training   Training Prescription - Yes Yes Yes    Weight - orange bands orange bands orange bands    Reps - 10-15 10-15 10-15    Time - 10 Minutes 10 Minutes 10 Minutes      Treadmill   MPH - _2 Grade - 0 0 1    Minutes - _3 T5 Nustep   Level - _4 SPM - 80 80 80    Minutes - _5 METs - 1.9 1.9 2.4      Home Exercise Plan   Plans to continue exercise at Home (comment) - - -    Frequency Add 3 additional days to program exercise sessions. - - -    Initial Home Exercises Provided 02/02/20 - - -           Exercise Comments:  Exercise Comments    Row Name 02/02/20 1501           Exercise Comments home exercise complete              Exercise Goals and Review:  Exercise Goals    Row Name 01/16/20 1209 02/07/20 1552 03/06/20 1518  Exercise Goals   Increase Physical Activity Yes Yes Yes     Intervention Provide advice, education, support and counseling about physical activity/exercise needs.;Develop an individualized exercise prescription for aerobic and resistive training based on initial evaluation findings, risk stratification, comorbidities and participant's personal goals. Provide advice, education, support and counseling about physical activity/exercise needs.;Develop an individualized exercise prescription for aerobic and resistive training based on initial evaluation findings, risk stratification,  comorbidities and participant's personal goals. Provide advice, education, support and counseling about physical activity/exercise needs.;Develop an individualized exercise prescription for aerobic and resistive training based on initial evaluation findings, risk stratification, comorbidities and participant's personal goals.     Expected Outcomes Short Term: Attend rehab on a regular basis to increase amount of physical activity.;Long Term: Add in home exercise to make exercise part of routine and to increase amount of physical activity.;Long Term: Exercising regularly at least 3-5 days a week. Short Term: Attend rehab on a regular basis to increase amount of physical activity.;Long Term: Add in home exercise to make exercise part of routine and to increase amount of physical activity.;Long Term: Exercising regularly at least 3-5 days a week. Short Term: Attend rehab on a regular basis to increase amount of physical activity.;Long Term: Add in home exercise to make exercise part of routine and to increase amount of physical activity.;Long Term: Exercising regularly at least 3-5 days a week.     Increase Strength and Stamina Yes Yes Yes     Intervention Provide advice, education, support and counseling about physical activity/exercise needs.;Develop an individualized exercise prescription for aerobic and resistive training based on initial evaluation findings, risk stratification, comorbidities and participant's personal goals. Provide advice, education, support and counseling about physical activity/exercise needs.;Develop an individualized exercise prescription for aerobic and resistive training based on initial evaluation findings, risk stratification, comorbidities and participant's personal goals. Provide advice, education, support and counseling about physical activity/exercise needs.;Develop an individualized exercise prescription for aerobic and resistive training based on initial evaluation findings,  risk stratification, comorbidities and participant's personal goals.     Expected Outcomes Short Term: Increase workloads from initial exercise prescription for resistance, speed, and METs.;Short Term: Perform resistance training exercises routinely during rehab and add in resistance training at home;Long Term: Improve cardiorespiratory fitness, muscular endurance and strength as measured by increased METs and functional capacity (6MWT) Short Term: Increase workloads from initial exercise prescription for resistance, speed, and METs.;Short Term: Perform resistance training exercises routinely during rehab and add in resistance training at home;Long Term: Improve cardiorespiratory fitness, muscular endurance and strength as measured by increased METs and functional capacity (6MWT) Short Term: Increase workloads from initial exercise prescription for resistance, speed, and METs.;Short Term: Perform resistance training exercises routinely during rehab and add in resistance training at home;Long Term: Improve cardiorespiratory fitness, muscular endurance and strength as measured by increased METs and functional capacity (6MWT)     Able to understand and use rate of perceived exertion (RPE) scale Yes Yes Yes     Intervention Provide education and explanation on how to use RPE scale Provide education and explanation on how to use RPE scale Provide education and explanation on how to use RPE scale     Expected Outcomes Short Term: Able to use RPE daily in rehab to express subjective intensity level;Long Term:  Able to use RPE to guide intensity level when exercising independently Short Term: Able to use RPE daily in rehab to express subjective intensity level;Long Term:  Able to use RPE to guide intensity level when  exercising independently Short Term: Able to use RPE daily in rehab to express subjective intensity level;Long Term:  Able to use RPE to guide intensity level when exercising independently     Able to  understand and use Dyspnea scale Yes Yes Yes     Intervention Provide education and explanation on how to use Dyspnea scale Provide education and explanation on how to use Dyspnea scale Provide education and explanation on how to use Dyspnea scale     Expected Outcomes Short Term: Able to use Dyspnea scale daily in rehab to express subjective sense of shortness of breath during exertion;Long Term: Able to use Dyspnea scale to guide intensity level when exercising independently Short Term: Able to use Dyspnea scale daily in rehab to express subjective sense of shortness of breath during exertion;Long Term: Able to use Dyspnea scale to guide intensity level when exercising independently Short Term: Able to use Dyspnea scale daily in rehab to express subjective sense of shortness of breath during exertion;Long Term: Able to use Dyspnea scale to guide intensity level when exercising independently     Knowledge and understanding of Target Heart Rate Range (THRR) Yes Yes Yes     Intervention Provide education and explanation of THRR including how the numbers were predicted and where they are located for reference Provide education and explanation of THRR including how the numbers were predicted and where they are located for reference Provide education and explanation of THRR including how the numbers were predicted and where they are located for reference     Expected Outcomes Short Term: Able to state/look up THRR;Short Term: Able to use daily as guideline for intensity in rehab;Long Term: Able to use THRR to govern intensity when exercising independently Short Term: Able to state/look up THRR;Short Term: Able to use daily as guideline for intensity in rehab;Long Term: Able to use THRR to govern intensity when exercising independently Short Term: Able to state/look up THRR;Short Term: Able to use daily as guideline for intensity in rehab;Long Term: Able to use THRR to govern intensity when exercising independently      Understanding of Exercise Prescription Yes Yes Yes     Intervention Provide education, explanation, and written materials on patient's individual exercise prescription Provide education, explanation, and written materials on patient's individual exercise prescription Provide education, explanation, and written materials on patient's individual exercise prescription     Expected Outcomes Short Term: Able to explain program exercise prescription;Long Term: Able to explain home exercise prescription to exercise independently Short Term: Able to explain program exercise prescription;Long Term: Able to explain home exercise prescription to exercise independently Short Term: Able to explain program exercise prescription;Long Term: Able to explain home exercise prescription to exercise independently            Exercise Goals Re-Evaluation :  Exercise Goals Re-Evaluation    Row Name 02/07/20 1553 03/06/20 0744           Exercise Goal Re-Evaluation   Exercise Goals Review Increase Strength and Stamina;Increase Physical Activity;Able to understand and use rate of perceived exertion (RPE) scale;Able to understand and use Dyspnea scale;Knowledge and understanding of Target Heart Rate Range (THRR);Understanding of Exercise Prescription Increase Strength and Stamina;Increase Physical Activity;Able to understand and use rate of perceived exertion (RPE) scale;Able to understand and use Dyspnea scale;Knowledge and understanding of Target Heart Rate Range (THRR);Understanding of Exercise Prescription      Comments Pt has completed 5 exercise sessions. She has a positive attitude and gives good effort. She currently  exercises at 1.9 METs on the stepper. Will continue to monitor and progress as able. Pt has completed 10 exercise sessions. She maintains a positive attitude. She is trying to be more active at home as well. She currently exercises at 2.1 METs on the stepper. Will continue to monitor and progress as  able.      Expected Outcomes Through exercise at rehab and at home, the patient will decrease shortness of breath with daily activities and feel confident in carrying out an exercise regime at home. Through exercise at rehab and at home, the patient will decrease shortness of breath with daily activities and feel confident in carrying out an exercise regime at home.             Discharge Exercise Prescription (Final Exercise Prescription Changes):  Exercise Prescription Changes - 03/06/20 1400      Response to Exercise   Blood Pressure (Admit) 144/86    Blood Pressure (Exercise) 140/82    Blood Pressure (Exit) 126/80    Heart Rate (Admit) 115 bpm    Heart Rate (Exercise) 125 bpm    Heart Rate (Exit) 115 bpm    Oxygen Saturation (Admit) 96 %    Oxygen Saturation (Exercise) 95 %    Oxygen Saturation (Exit) 98 %    Rating of Perceived Exertion (Exercise) 10    Perceived Dyspnea (Exercise) 2    Duration Continue with 30 min of aerobic exercise without signs/symptoms of physical distress.    Intensity THRR unchanged      Progression   Progression Continue to progress workloads to maintain intensity without signs/symptoms of physical distress.      Resistance Training   Training Prescription Yes    Weight orange bands    Reps 10-15    Time 10 Minutes      Treadmill   MPH 2    Grade 1    Minutes 15      T5 Nustep   Level 3    SPM 80    Minutes 15    METs 2.4           Nutrition:  Target Goals: Understanding of nutrition guidelines, daily intake of sodium <1536m, cholesterol <2013m calories 30% from fat and 7% or less from saturated fats, daily to have 5 or more servings of fruits and vegetables.  Biometrics:  Pre Biometrics - 01/16/20 1120      Pre Biometrics   Height _0  (1.651 m)    Weight 89.3 kg    BMI (Calculated) 32.76    Grip Strength 24 kg            Nutrition Therapy Plan and Nutrition Goals:  Nutrition Therapy & Goals - 01/26/20 1407       Nutrition Therapy   Diet Carb Modified/generally healthy      Personal Nutrition Goals   Nutrition Goal Improved blood glucose control as evidenced by pt's A1c trending from 7.7 toward less than 7.0.    Personal Goal #2 CBG concentrations in the normal range or as close to normal as is safely possible    Personal Goal #3 Pt able to name foods that affect blood glucose      Intervention Plan   Intervention Prescribe, educate and counsel regarding individualized specific dietary modifications aiming towards targeted core components such as weight, hypertension, lipid management, diabetes, heart failure and other comorbidities.;Nutrition handout(s) given to patient.    Expected Outcomes Short Term Goal: A plan has been developed with personal  nutrition goals set during dietitian appointment.           Nutrition Assessments:  Nutrition Assessments - 01/26/20 1408      Rate Your Plate Scores   Pre Score 52           Nutrition Goals Re-Evaluation:  Nutrition Goals Re-Evaluation    Row Name 01/26/20 1408 03/07/20 1016           Goals   Current Weight 196 lb (88.9 kg) 197 lb 12 oz (89.7 kg)      Nutrition Goal Improved blood glucose control as evidenced by pt's A1c trending from 7.7 toward less than 7.0. Improved blood glucose control as evidenced by pt's A1c trending from 7.7 toward less than 7.0.      Comment - Pt is limiting carbs at meals/snacks, pairing with protein and fiber, and keeping a food/glucose log        Personal Goal #2 Re-Evaluation   Personal Goal #2 CBG concentrations in the normal range or as close to normal as is safely possible CBG concentrations in the normal range or as close to normal as is safely possible        Personal Goal #3 Re-Evaluation   Personal Goal #3 Pt able to name foods that affect blood glucose Pt able to name foods that affect blood glucose             Nutrition Goals Discharge (Final Nutrition Goals Re-Evaluation):  Nutrition Goals  Re-Evaluation - 03/07/20 1016      Goals   Current Weight 197 lb 12 oz (89.7 kg)    Nutrition Goal Improved blood glucose control as evidenced by pt's A1c trending from 7.7 toward less than 7.0.    Comment Pt is limiting carbs at meals/snacks, pairing with protein and fiber, and keeping a food/glucose log      Personal Goal #2 Re-Evaluation   Personal Goal #2 CBG concentrations in the normal range or as close to normal as is safely possible      Personal Goal #3 Re-Evaluation   Personal Goal #3 Pt able to name foods that affect blood glucose           Psychosocial: Target Goals: Acknowledge presence or absence of significant depression and/or stress, maximize coping skills, provide positive support system. Participant is able to verbalize types and ability to use techniques and skills needed for reducing stress and depression.  Initial Review & Psychosocial Screening:  Initial Psych Review & Screening - 01/16/20 1125      Initial Review   Current issues with None Identified      Family Dynamics   Good Support System? Yes      Barriers   Psychosocial barriers to participate in program There are no identifiable barriers or psychosocial needs.      Screening Interventions   Interventions Encouraged to exercise           Quality of Life Scores:  Scores of 19 and below usually indicate a poorer quality of life in these areas.  A difference of  2-3 points is a clinically meaningful difference.  A difference of 2-3 points in the total score of the Quality of Life Index has been associated with significant improvement in overall quality of life, self-image, physical symptoms, and general health in studies assessing change in quality of life.  PHQ-9: Recent Review Flowsheet Data    Depression screen [facility] 2/9 01/16/2020 04/08/2019 03/25/2019 03/10/2019 01/19/2019   Decreased Interest 0 1 0  1 0   Down, Depressed, Hopeless 0 1 0 1 0   PHQ - 2 Score 0 2 0 2 0   Altered sleeping 0 1 - 0  -   Tired, decreased energy 0 1 - 0 -   Change in appetite 0 1 - 0 -   Feeling bad or failure about yourself  0 0 - 0 -   Trouble concentrating 0 0 - 0 -   Moving slowly or fidgety/restless 0 - - 0 -   Suicidal thoughts 0 0 - 0 -   PHQ-9 Score 0 5 - 2 -   Difficult doing work/chores Not difficult at all Somewhat difficult - Not difficult at all -     Interpretation of Total Score  Total Score Depression Severity:  1-4 = Minimal depression, 5-9 = Mild depression, 10-14 = Moderate depression, 15-19 = Moderately severe depression, 20-27 = Severe depression   Psychosocial Evaluation and Intervention:  Psychosocial Evaluation - 01/16/20 1126      Psychosocial Evaluation & Interventions   Interventions Encouraged to exercise with the program and follow exercise prescription    Comments No concern identified    Expected Outcomes For patient to be free of psychosocial concerns while in pulmonary rehab.    Continue Psychosocial Services  No Follow up required           Psychosocial Re-Evaluation:  Psychosocial Re-Evaluation    [place] Name 02/06/20 1327 03/05/20 1417           Psychosocial Re-Evaluation   Current issues with None Identified None Identified      Comments No concerns or barriers identified at this time, just started program. No psychosocial concerns identified at this time.      Expected Outcomes For Darene to continue to have no barriers or psychosocial concerns while participating in pulmonary rehab. For Shamyia to continue to be free of psychoscial concerns while participating in pulmonary rehab.      Interventions Encouraged to attend Pulmonary Rehabilitation for the exercise Encouraged to attend Pulmonary Rehabilitation for the exercise      Continue Psychosocial Services  No Follow up required No Follow up required             Psychosocial Discharge (Final Psychosocial Re-Evaluation):  Psychosocial Re-Evaluation - 03/05/20 1417      Psychosocial Re-Evaluation    Current issues with None Identified    Comments No psychosocial concerns identified at this time.    Expected Outcomes For Geraldyne to continue to be free of psychoscial concerns while participating in pulmonary rehab.    Interventions Encouraged to attend Pulmonary Rehabilitation for the exercise    Continue Psychosocial Services  No Follow up required           Education: Education Goals: Education classes will be provided on a weekly basis, covering required topics. Participant will state understanding/return demonstration of topics presented.  Learning Barriers/Preferences:  Learning Barriers/Preferences - 01/16/20 1126      Learning Barriers/Preferences   Learning Barriers None    Learning Preferences Computer/Internet           Education Topics: Risk Factor Reduction:  -Group instruction that is supported by a PowerPoint presentation. Instructor discusses the definition of a risk factor, different risk factors for pulmonary disease, and how the heart and lungs work together.     PULMONARY REHAB OTHER RESPIRATORY from 03/01/2020 in [place]  Date 03/01/20  Educator handout      Nutrition  for Pulmonary Patient:  -Group instruction provided by PowerPoint slides, verbal discussion, and written materials to support subject matter. The instructor gives an explanation and review of healthy diet recommendations, which includes a discussion on weight management, recommendations for fruit and vegetable consumption, as well as protein, fluid, caffeine, fiber, sodium, sugar, and alcohol. Tips for eating when patients are short of breath are discussed.   Pursed Lip Breathing:  -Group instruction that is supported by demonstration and informational handouts. Instructor discusses the benefits of pursed lip and diaphragmatic breathing and detailed demonstration on how to preform both.     PULMONARY REHAB OTHER RESPIRATORY from 03/01/2020 in [place]  Date 02/21/20  Educator Handout  [Know your numbers]      Oxygen Safety:  -Group instruction provided by PowerPoint, verbal discussion, and written material to support subject matter. There is an overview of "What is Oxygen" and "Why do we need it".  Instructor also reviews how to create a safe environment for oxygen use, the importance of using oxygen as prescribed, and the risks of noncompliance. There is a brief discussion on traveling with oxygen and resources the patient may utilize.   Oxygen Equipment:  -Group instruction provided by [facility] Staff utilizing handouts, written materials, and equipment demonstrations.   Signs and Symptoms:  -Group instruction provided by written material and verbal discussion to support subject matter. Warning signs and symptoms of infection, stroke, and heart attack are reviewed and when to call the physician/911 reinforced. Tips for preventing the spread of infection discussed.   Advanced Directives:  -Group instruction provided by verbal instruction and written material to support subject matter. Instructor reviews Advanced Directive laws and proper instruction for filling out document.   Pulmonary Video:  -Group video education that reviews the importance of medication and oxygen compliance, exercise, good nutrition, pulmonary hygiene, and pursed lip and diaphragmatic breathing for the pulmonary patient.   Exercise for the Pulmonary Patient:  -Group instruction that is supported by a PowerPoint presentation. Instructor discusses benefits of exercise, core components of exercise, frequency, duration, and intensity of an exercise routine, importance of utilizing pulse oximetry during exercise, safety while exercising, and options of places to exercise outside of rehab.     Pulmonary Medications:  -Verbally interactive group education provided by instructor with focus on inhaled medications and proper  administration.   PULMONARY REHAB OTHER RESPIRATORY from 02/14/2020 in [place]  Date 01/31/20  Educator Handout      Anatomy and Physiology of the Respiratory System and Intimacy:  -Group instruction provided by PowerPoint, verbal discussion, and written material to support subject matter. Instructor reviews respiratory cycle and anatomical components of the respiratory system and their functions. Instructor also reviews differences in obstructive and restrictive respiratory diseases with examples of each. Intimacy, Sex, and Sexuality differences are reviewed with a discussion on how relationships can change when diagnosed with pulmonary disease. Common sexual concerns are reviewed.   MD DAY -A group question and answer session with a medical doctor that allows participants to ask questions that relate to their pulmonary disease state.   OTHER EDUCATION -Group or individual verbal, written, or video instructions that support the educational goals of the pulmonary rehab program.   PULMONARY REHAB OTHER RESPIRATORY from 02/14/2020 in [place]  Date 01/24/20  Educator Lucretia Roers level h/o]  Instruction Review Code --  [na]      Holiday Eating Survival Tips:  -  Group instruction provided by PowerPoint slides, verbal discussion, and written materials to support subject matter. The instructor gives patients tips, tricks, and techniques to help them not only survive but enjoy the holidays despite the onslaught of food that accompanies the holidays.   Knowledge Questionnaire Score:  Knowledge Questionnaire Score - 01/16/20 1139      Knowledge Questionnaire Score   Pre Score 16/18           Core Components/Risk Factors/Patient Goals at Admission:  Personal Goals and Risk Factors at Admission - 01/16/20 1217      Core Components/Risk Factors/Patient Goals on Admission   Improve shortness of breath with ADL's Yes     Intervention Provide education, individualized exercise plan and daily activity instruction to help decrease symptoms of SOB with activities of daily living.    Expected Outcomes Short Term: Improve cardiorespiratory fitness to achieve a reduction of symptoms when performing ADLs;Long Term: Be able to perform more ADLs without symptoms or delay the onset of symptoms           Core Components/Risk Factors/Patient Goals Review:   Goals and Risk Factor Review    Row Name 01/16/20 1217 02/06/20 1328 03/05/20 1419         Core Components/Risk Factors/Patient Goals Review   Personal Goals Review Increase knowledge of respiratory medications and ability to use respiratory devices properly.;Improve shortness of breath with ADL's;Develop more efficient breathing techniques such as purse lipped breathing and diaphragmatic breathing and practicing self-pacing with activity. Increase knowledge of respiratory medications and ability to use respiratory devices properly.;Improve shortness of breath with ADL's;Develop more efficient breathing techniques such as purse lipped breathing and diaphragmatic breathing and practicing self-pacing with activity. Increase knowledge of respiratory medications and ability to use respiratory devices properly.;Improve shortness of breath with ADL's;Develop more efficient breathing techniques such as purse lipped breathing and diaphragmatic breathing and practicing self-pacing with activity.     Review - Just started program, has attended 4 exercise sessions, is doing well, CBG's have been elevated 200-250 pre-exercise, PCP added glypazide.  2 mets on nustep, 2 mph/0 incline on treadmill, doing well. Velera is exercising on the nustep at level 3 and 2.1 mets, and walking on the treadmill @ 2 mph and 1% incline.  Her stamina and shortness of breath are improving.     Expected Outcomes - See admission goals. See admission goals.            Core Components/Risk Factors/Patient  Goals at Discharge (Final Review):   Goals and Risk Factor Review - 03/05/20 1419      Core Components/Risk Factors/Patient Goals Review   Personal Goals Review Increase knowledge of respiratory medications and ability to use respiratory devices properly.;Improve shortness of breath with ADL's;Develop more efficient breathing techniques such as purse lipped breathing and diaphragmatic breathing and practicing self-pacing with activity.    Review Artasia is exercising on the nustep at level 3 and 2.1 mets, and walking on the treadmill @ 2 mph and 1% incline.  Her stamina and shortness of breath are improving.    Expected Outcomes See admission goals.           ITP Comments:   Comments: ITP REVIEW Pt is making expected progress toward pulmonary rehab goals after completing 11 sessions. Recommend continued exercise, life style modification, education, and utilization of breathing techniques to increase stamina and strength and decrease shortness of breath with exertion.

## 2020-03-13 ENCOUNTER — Other Ambulatory Visit: Payer: Self-pay

## 2020-03-13 ENCOUNTER — Encounter (HOSPITAL_COMMUNITY)
Admission: RE | Admit: 2020-03-13 | Discharge: 2020-03-13 | Disposition: A | Discharge status: Home / Self Care | Payer: 59 | Source: Ambulatory Visit | Attending: Pulmonary Disease | Admitting: Pulmonary Disease

## 2020-03-13 VITALS — Wt 197.8 lb

## 2020-03-13 DIAGNOSIS — J849 Interstitial pulmonary disease, unspecified: Secondary | ICD-10-CM

## 2020-03-13 NOTE — Progress Notes (Signed)
Daily Session Note  Patient Details  Name: [name] MRN: [id number] Date of Birth: September 07, 1975 Referring Provider:     Pulmonary Rehab Walk Test from 01/16/2020 in [place]  Referring Provider Dr. Vaughan Browner      Encounter Date: 03/13/2020  Check In:  Session Check In - 03/13/20 1302      Check-In   Supervising physician immediately available to respond to emergencies [facility] immediately available    Physician(s) Dr. Pietro Cassis    Location [facility]    Staff Present Rosebud Poles, RN, Bjorn Loser, MS, CEP, Exercise Physiologist;Lisa Jani Gravel, MS, ACSM-CEP, Exercise Physiologist    Virtual Visit No    Medication changes reported     No    Fall or balance concerns reported    No    Tobacco Cessation No Change    Warm-up and Cool-down Performed on first and last piece of equipment    Resistance Training Performed Yes    VAD Patient? No    PAD/SET Patient? No      Pain Assessment   Currently in Pain? No/denies    Multiple Pain Sites No           Capillary Blood Glucose: No results found for this or any previous visit (from the past 24 hour(s)).    Social History   Tobacco Use  Smoking Status Never Smoker  Smokeless Tobacco Never Used    Goals Met:  Proper associated with RPD/PD & O2 Sat Exercise tolerated well Strength training completed today  Goals Unmet:  Not Applicable  Comments: Service time is from 1300 to 1400.    Dr. Fransico Him is Medical Director for Cardiac Rehab at [facility].

## 2020-03-14 ENCOUNTER — Encounter: Payer: Self-pay | Admitting: Primary Care

## 2020-03-14 ENCOUNTER — Ambulatory Visit (INDEPENDENT_AMBULATORY_CARE_PROVIDER_SITE_OTHER): Payer: 59 | Admitting: Primary Care

## 2020-03-14 DIAGNOSIS — J849 Interstitial pulmonary disease, unspecified: Secondary | ICD-10-CM | POA: Diagnosis not present

## 2020-03-14 NOTE — Progress Notes (Signed)
_0  ID: [name], female    DOB: 26-Jan-1976, 44 y.o.   MRN: [id number]  Chief Complaint  Patient presents with  . Follow-up    Referring provider: Sueanne Margarita, DO  HPI: 44 year old female, never smoked.  Past medical history significant for interstitial lung disease, Covid 19, fibromyalgia, depression/anxiety.  Patient of Dr. Vaughan Browner, last seen in office on 02/20/2020.  Patient was referred for evaluation due to abnormal CT scan/ILD.  She reports increasing dyspnea and chest pressure since August 2020.  CTA done by PCP showed bilateral diffuse pulmonary infiltrates.  She was treated with antibiotics with no improvement in NSIP, alternative pattern on CT scan.  She has had unspecified autoimmune disease and has been referred to Dr. Trudie Reed with rheumatology.  She had a work-up including labs which were reportedly negative.  Patient underwent bronchoscopy in November 2020 with findings of lymphocytosis on BAL and granulomas.  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  She was diagnosed with COVID-19 on September 07, 2019 and received a monoclonal antibody therapy.  She was restarted on prednisone post Covid due to worsening dyspnea and in March was down to 5 mg daily.  Patient had persistent dyspnea symptoms and CT scan showed evolving interstitial lung disease.  Prednisone was increased back up to 40 mg a day and surgical lung biopsy was done on December 12, 2019  03/14/2020 Patient presents today for 1 month follow-up hypersensitivity pneumonitis in January 2021 treated with prednisone.  This was complicated by [id number] causing lung injury and long-haul syndrome.  It was felt that she may have developed progressive fibrosing interstitial lung disease.  She is currently on prednisone 20 mg daily and Bactrim 3 times a week.   She has been tapering prednisone by 2.5 mg every 2 weeks, this was increased rate of 5 mg every 2 weeks during last visit.  She was also referred to [facility] for second opinion.  Patient saw Dr. Maceo Pro for initial consult on 02/22/2020. Symptoms felt to be related to chronic HP based on imaging, history and biopsy findings. Patient is to continue prednisone 75m daily. Recommended [employer] claims handlerevaluate home. Increased protonix to daily as GERD could be contributing.  They requested pathology slides to review. Plan follow-up in 3 months with repeat PFTs.   She is currently on 193mprednisone daily, feels her dyspnea/cough are worse. She has chest tightness. She continues to attend pulmonary rehab. She has completed 9 weeks of therapy, she will completing next week. States exercise is very hard. She gets out of breathing when speaking, reports brain fog. She has been on short term disability since April 2021 which is slated to end on July 31st. She does not feel well enough to return. She has gotten rid of down comforter. Her case worker suggested note/ AVS summary to extend this. She feels the same on or off prednisone. Dr. FiOneida Alaranted her to follow-up back with Dr. MaVaughan Brownero consider burst of prednisone and repeating PFTs 1 week after burst to see if there is any improvement in lung function. We may be able to consider spirometry in office with DLCO. She has a virtual visit with cardiology scheduled for early August. Needs repeat HRCT in October. Patient showed proof of COVID-19 vaccination, will scan into chart.   Pulmonary Function Test  02/22/2020  FVC PRE L 2.68  FEV1 PRE L 2.24  FEV1/FVC PRE % 83.68  TLC PRE L 3.68  RV PRE L 0.90  DLCO PRE ml/(min*mmHg) 13.01  FEF25-75% PRE L/s 3.01    Impression: Evidence of mild restrictive ventilatory defect with moderately reduced diffusing capacity. Compared to March 2021, FVC is stable, DLCO has slight decline  AMBULATORY WALK: Patient ambulated  for 6 minutes on room air and did not require supplemental oxygen. Walk distance slightly below predicted. Perceived dyspnea was moderate.    IMAGING: CTA 06/15/2019-no pulmonary embolism, mild diffuse interstitial prominence with bilateral groundglass and nodular airspace opacities.   High-res CT 07/11/2019-Bilateral fine nodularity with groundglass with no significant air trapping. 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  High-res CT 11/02/2019-subpleural reticulation with groundglass, traction bronchiectasis no basal gradient. Indeterminate for UIP.  High-Res CT 01/2020 - subpleural reticulations with groundglass. No honeycombing. Slightly basal predominant gradient. Indeterminate for UIP    Surgical lung biopsy 12/12/2019 features suggestive of chronic hypersensitivity pneumonitis with heterogeneous fibrosis and superimposed subacute lung injury.   Echocardiogram  07/04/2019-LVEF 65-70%, normal RV systolic function, normal pulmonary artery systolic pressure  5/00/9381-WEXH 37-16%, grade 1 diastolic dysfunction normal RV systolic function   Allergies  Allergen Reactions  . Methimazole Other (See Comments)    Body goes into arthritic shock  . Bupropion     Body aches / headaches     Immunization History  Administered Date(s) Administered  . Influenza Split 05/19/2012  . Influenza Whole 06/19/2008  . Influenza,inj,Quad PF,6+ Mos 04/30/2015, 04/12/2016, 06/23/2018, 05/25/2019  . Influenza-Unspecified 04/27/2015, 04/30/2015, 04/25/2016  . PFIZER SARS-COV-2 Vaccination 01/26/2020  . Td 07/13/2009    Past Medical History:  Diagnosis Date  . Anxiety   . Arthritis   . Depression   . Diabetes mellitus without complication ([place])   . Dyspnea   . Fibromyalgia   . GERD (gastroesophageal reflux disease)   . Headache    migraines  . Hypothyroidism 1999   post PTU Rx for hyperthyroidism  . IBS (irritable bowel syndrome)   . Interstitial lung disease ([place])   . Pneumonia 06/2019   Double PNA     Tobacco History: Social History   Tobacco Use  Smoking Status Never Smoker  Smokeless Tobacco Never Used   Counseling given: Not Answered   Outpatient Medications Prior to Visit  Medication Sig Dispense Refill  . acetaminophen (TYLENOL) 650 MG CR tablet Take 650 mg by mouth every 8 (eight) hours as needed for pain.    . DULoxetine (CYMBALTA) 60 MG capsule TAKE 1 CAPSULE BY MOUTH EVERY DAY (Patient taking differently: Take 60 mg by mouth daily. ) 90 capsule 2  . glipiZIDE (GLUCOTROL) 5 MG tablet Take 5 mg by mouth daily.    Marland Kitchen levothyroxine (SYNTHROID) 125 MCG tablet Take 125 mcg by mouth daily before breakfast.    . metFORMIN (GLUCOPHAGE-XR) 500 MG 24 hr tablet Take 1,000 mg by mouth at bedtime. 500 in AM and 1000 at bedtime    . pantoprazole (PROTONIX) 40 MG tablet Take 2 tablets (80 mg total) by mouth daily. 30 tablet 11  . predniSONE (DELTASONE) 20 MG tablet TAKE 2 TABLETS (40 MG TOTAL) BY MOUTH DAILY WITH BREAKFAST. 60 tablet 2  . predniSONE (DELTASONE) 5 MG tablet As directed by provider 30 tablet 1  . pregabalin (LYRICA) 150 MG capsule Take 1 capsule (150 mg total) by mouth 2 (two) times daily. 60 capsule 3  . sulfamethoxazole-trimethoprim (BACTRIM DS) 800-160 MG tablet Take 1 tablet by mouth daily. 12 tablet 3  .  furosemide (LASIX) 20 MG tablet Take 1 tablet (20 mg total) by mouth daily. 30 tablet 1  . ibuprofen (ADVIL) 200 MG tablet Take 1 tablet (200 mg total) by mouth every 6 (six) hours as needed. (Patient not taking: Reported on 01/16/2020) 30 tablet 0   No facility-administered medications prior to visit.   Review of Systems  Review of Systems  Constitutional: Positive for fatigue.  Respiratory: Positive for cough, chest tightness and shortness of breath.     Physical Exam  BP 122/80 (BP Location: Left Arm, Cuff Size: Normal)   Pulse (!) 112   Temp 98.1 F (36.7 C) (Oral)   Ht _0  (1.676 m)   Wt 198 lb 6.4 oz (90 kg)   SpO2 98%   BMI 32.02 kg/m    Physical Exam Constitutional:      General: She is not in acute distress.    Appearance: Normal appearance. She is not ill-appearing.  HENT:     Head: Normocephalic and atraumatic.     Right Ear: Tympanic membrane normal.     Left Ear: Tympanic membrane normal.     Mouth/Throat:     Mouth: Mucous membranes are moist.     Pharynx: Oropharynx is clear.     Comments: One to two sores to roof of mouth <11m Cardiovascular:     Rate and Rhythm: Normal rate and regular rhythm.  Pulmonary:     Effort: Pulmonary effort is normal.     Breath sounds: Normal breath sounds. No wheezing or rhonchi.     Comments: Lungs mostly clear, possible fine rales bases. Persistent dry cough, moderate dyspnea when speaking  Skin:    General: Skin is warm and dry.  Neurological:     General: No focal deficit present.     Mental Status: She is alert and oriented to person, place, and time. Mental status is at baseline.  Psychiatric:        Mood and Affect: Mood normal.        Thought Content: Thought content normal.      Lab Results:  CBC    Component Value Date/Time   WBC 9.6 12/13/2019 0252   RBC 3.99 12/13/2019 0252   HGB 11.4 (L) 12/13/2019 0252   HCT 35.8 (L) 12/13/2019 0252   PLT 241 12/13/2019 0252   MCV 89.7 12/13/2019 0252   MCH 28.6 12/13/2019 0252   MCHC 31.8 12/13/2019 0252   RDW 14.0 12/13/2019 0252   LYMPHSABS 1.6 12/02/2019 0831   MONOABS 0.9 12/02/2019 0831   EOSABS 0.0 12/02/2019 0831   BASOSABS 0.0 12/02/2019 0831    BMET    Component Value Date/Time   NA 136 12/13/2019 0252   K 4.1 12/13/2019 0252   CL 99 12/13/2019 0252   CO2 29 12/13/2019 0252   GLUCOSE 197 (H) 12/13/2019 0252   BUN 9 12/13/2019 0252   CREATININE 0.82 12/13/2019 0252   CREATININE 0.75 03/25/2019 1610   CALCIUM 8.5 (L) 12/13/2019 0252   GFRNONAA >60 12/13/2019 0252   GFRAA >60 12/13/2019 0252    BNP No results found for: BNP  ProBNP    Component Value Date/Time   PROBNP 24.0 12/02/2019  0831    Imaging: No results found.   Assessment & Plan:   ILD (interstitial lung disease) ([facility] - Patient continues to have significant respiratory symptoms from interstitial lung disease and covid long haul syndrome. [facility] She is currently on prednisone  59m daily, feels no difference in breathing on/off steroids. She had no benefit from ICS/LABA inhaler or SABA.  - We will reach out to Dr. MVaughan Brownerto discuss prednisone burst and potentially getting spirometry with DLCO in office following this. We made a copy of you Covid-19 vaccine card.  - Will need repeat HRCT in October 2021 - Recommend continue short term disability for another 4-6 weeks.  - Follow-up due in 2 months with Dr. MAshley Mariner NP 03/14/2020

## 2020-03-14 NOTE — Patient Instructions (Addendum)
Pleasure seeing you today Ms [name] continue to have significant respiratory symptoms from interstitial lung disease and covid long haul syndrome. Recommend continue short term disability for another 4-6 weeks.   We made a copy of you Covid-19 vaccine card  We will reach out to [name] to discuss prednisone burst and potentially getting spirometry with DLCO in office following this  Follow-up: [name] in 2 months (recall may already be put in)

## 2020-03-14 NOTE — Assessment & Plan Note (Signed)
-   Patient continues to have significant respiratory symptoms from interstitial lung disease and covid long haul syndrome. She was seen for second opinion by Dr. Darrick Penna at [facility]. She is currently on prednisone 10mg  daily, feels no difference in breathing on/off steroids. She had no benefit from ICS/LABA inhaler or SABA.  - We will reach out to Dr. to discuss prednisone burst and potentially getting spirometry with DLCO in office following this. We made a copy of you Covid-19 vaccine card.  - Will need repeat HRCT in October 2021 - Recommend continue short term disability for another 4-6 weeks.  - Follow-up due in 2 months with Dr. November 2021

## 2020-03-15 ENCOUNTER — Encounter (HOSPITAL_COMMUNITY): Payer: 59

## 2020-03-15 ENCOUNTER — Telehealth (HOSPITAL_COMMUNITY): Payer: Self-pay | Admitting: Internal Medicine

## 2020-03-15 ENCOUNTER — Telehealth: Payer: Self-pay | Admitting: Primary Care

## 2020-03-15 DIAGNOSIS — R0602 Shortness of breath: Secondary | ICD-10-CM

## 2020-03-15 DIAGNOSIS — R4189 Other symptoms and signs involving cognitive functions and awareness: Secondary | ICD-10-CM

## 2020-03-15 NOTE — Telephone Encounter (Signed)
Please let patient know I discussed her case again with [name] and relayed her concerns. He said we could do prednisone 40mg  x 2 weeks and then check spirometry. She will not need to taper this dose we could go back to 10mg  daily and may discontinue if not demonstable improvement in lung function on it.   Please sent in prescription or 40mg  daily x 2 weeks (give her #20 tabs and please order spirometry with DLCO in 2 weeks (needs to be done at this time)  - 

## 2020-03-15 NOTE — Telephone Encounter (Signed)
Spoke with pt and advised per recommendation, placed order for Spiro and DLCO. Pt stated she already had enough Prednisone to fulfill 2 week course. Pt stated she has had both Covid vaccines, will update immunizations. Pt verbalizes understanding. Nothing further needed at this time.

## 2020-03-15 NOTE — Telephone Encounter (Signed)
Patient sent a message requesting a neuro referral.    For the Neuro referral, I believe Mannam wanted [facility]. However, could I try to be have the referral go to the following instead?  [facility] Dr Anne Hahn [phone] [place],  [place]  [address] Main: [phone]  Thank you & please advise as soon as possible.  Necole Minassian [phone]   Message routed to Dr Isaiah Serge to advise

## 2020-03-15 NOTE — Telephone Encounter (Signed)
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 Put her back on 40 mg of prednisone/day. I would wait for atleast 4 weeks before repeating the spiro and DLCO. Make sure she is getting bactrim 1 ds 3 times/week for PJP prophylaxis  For brain for refer to [facility] for neuropsychology testing. Needs to see Dr. Milbert Coulter or Roseanne Reno  Thanks for seeing her. 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 She has chest tightness. States exercise is very hard. She gets out of breathing when speaking, reports brain fog. She has gotten rid of down comforter. She feels the same on or off prednisone. Dr. wanted her to follow-up back with Dr. Darrick Penna to consider burst of prednisone and repeating PFTs 1 week after burst to see if there is any improvement in lung function.   Do you recommend this? What dose of prednisone would you recommend and would she need to taper off or just got back to 10mg  daily. We would likely be able to do spirometry in office with DLCO.   Also who do you recommend referring her for brain fog d/t covid?   -Beth

## 2020-03-17 NOTE — Telephone Encounter (Signed)
Okay to change referral to [facility], Dr. Anne Hahn.

## 2020-03-19 NOTE — Telephone Encounter (Signed)
What diagnosis would you like [place] to use for this referral?

## 2020-03-20 ENCOUNTER — Telehealth: Payer: Self-pay | Admitting: Pulmonary Disease

## 2020-03-20 ENCOUNTER — Telehealth (HOSPITAL_COMMUNITY): Payer: Self-pay | Admitting: Internal Medicine

## 2020-03-20 ENCOUNTER — Encounter (HOSPITAL_COMMUNITY): Payer: 59

## 2020-03-20 NOTE — Telephone Encounter (Signed)
Spoke with the pt  She states that the form was supposed to have been faxed to [place] today  I let her know that it needed to go to [employer], and if we do have it, that is where it will be sent to [facility], have you seen this in Mannam's lookat?

## 2020-03-21 NOTE — Telephone Encounter (Signed)
Use the same indication as was used for the [facility] referral Post covid brain fog I think was used

## 2020-03-21 NOTE — Telephone Encounter (Signed)
Pt dropped off std paperwork. Paperwork was given to [facility] for [name] to fill out. Number for paperwork to be faxed is on the paperwork. Pt can be reach at [phone].

## 2020-03-21 NOTE — Telephone Encounter (Signed)
Forms received by Rodell Perna and brought to me for Dr Isaiah Serge to sign. Will have Dr Isaiah Serge sign 03/22/20 we he returns to clinic.

## 2020-03-21 NOTE — Telephone Encounter (Signed)
I have not received any disability forms on this Patient at this time.

## 2020-03-22 ENCOUNTER — Other Ambulatory Visit: Payer: Self-pay

## 2020-03-22 ENCOUNTER — Encounter (HOSPITAL_COMMUNITY)
Admission: RE | Admit: 2020-03-22 | Discharge: 2020-03-22 | Disposition: A | Discharge status: Home / Self Care | Payer: 59 | Source: Ambulatory Visit | Attending: Pulmonary Disease | Admitting: Pulmonary Disease

## 2020-03-22 DIAGNOSIS — J849 Interstitial pulmonary disease, unspecified: Secondary | ICD-10-CM | POA: Diagnosis not present

## 2020-03-22 NOTE — Telephone Encounter (Signed)
Dr Isaiah Serge has forms.  Will follow up once signed and returned.

## 2020-03-22 NOTE — Telephone Encounter (Signed)
mychart message sent by pt requesting an update on the disability paperwork. I did see that the paperwork was in Dr. Shirlee More folder.   Lisa and [name], please update on status of papers so pt can also be updated. Thanks!

## 2020-03-23 NOTE — Telephone Encounter (Signed)
Disability forms completed by Dr Isaiah Serge.  Forms given to [facility], per Patrice's request. Rodell Perna is not in office today. Nothing further at this time.

## 2020-03-26 ENCOUNTER — Other Ambulatory Visit: Payer: Self-pay | Admitting: Pulmonary Disease

## 2020-03-26 NOTE — Telephone Encounter (Signed)
Pharmacy requesting prednisone refill. Looking back on previous notes, its unclear as to whether or not this should be continued and if so, what dosage due to tapering. On 03/15/2020, Beth put a note in about refilling the prednisone (40mg ) for two weeks and then possibly discontinuing if improvement in lung function related DLCO/spirometry which is scheduled on 04/02/2020. Please advise as to whether or not to refill and for what dosage. Thanks

## 2020-03-27 ENCOUNTER — Other Ambulatory Visit: Payer: Self-pay | Admitting: *Deleted

## 2020-03-27 MED ORDER — SULFAMETHOXAZOLE-TRIMETHOPRIM 800-160 MG PO TABS: 1.0000 | ORAL_TABLET | ORAL | 3 refills | Status: DC

## 2020-03-27 NOTE — Telephone Encounter (Signed)
Chan faxed disability forms to [employer] on my behalf on 03/23/2020- called and spoke to patient today - mailing a copy to patient home -original going to be sent to medical records for scanning  -pr

## 2020-03-28 DIAGNOSIS — U071 COVID-19: Secondary | ICD-10-CM

## 2020-03-28 NOTE — Telephone Encounter (Signed)
Okay to draw these labs.  Please order

## 2020-03-28 NOTE — Telephone Encounter (Signed)
Dr Isaiah Serge please advise on pt My-chart message.    I am reviewing my recent ongoing symptoms etc and have come across I need to check my Iron, Vitamin B12, Vitamin D and Ferratin levels. Several people suffering from Long Haul Syndrome have these labs pulled. I am not seeing recent labs from Dr Thornell Mule - primary care doctor, nor Dr Isaiah Serge in the last 2-3 months. Please confirm & advise when I may come to Lab Dept to have labs done.  Thank you so much.   Lurene Shadow  [phone]

## 2020-03-29 ENCOUNTER — Other Ambulatory Visit (INDEPENDENT_AMBULATORY_CARE_PROVIDER_SITE_OTHER): Payer: 59

## 2020-03-29 DIAGNOSIS — U071 COVID-19: Secondary | ICD-10-CM | POA: Diagnosis not present

## 2020-03-29 LAB — FERRITIN: Ferritin: 6.6 ng/mL — ABNORMAL LOW (ref 10.0–291.0)

## 2020-03-29 LAB — VITAMIN D 25 HYDROXY (VIT D DEFICIENCY, FRACTURES): VITD: 25.79 ng/mL — ABNORMAL LOW (ref 30.00–100.00)

## 2020-03-29 LAB — IRON: Iron: 50 ug/dL (ref 42–145)

## 2020-03-29 LAB — VITAMIN B12: Vitamin B-12: 118 pg/mL — ABNORMAL LOW (ref 211–911)

## 2020-03-30 NOTE — Telephone Encounter (Signed)
Dr. Mannam, please advise. Thanks!  

## 2020-04-01 LAB — VITAMIN D 1,25 DIHYDROXY
Vitamin D 1, 25 (OH)2 Total: 61 pg/mL (ref 18–72)
Vitamin D2 1, 25 (OH)2: <8 pg/mL
Vitamin D3 1, 25 (OH)2: 61 pg/mL

## 2020-04-02 ENCOUNTER — Emergency Department (HOSPITAL_BASED_OUTPATIENT_CLINIC_OR_DEPARTMENT_OTHER): Payer: 59

## 2020-04-02 ENCOUNTER — Ambulatory Visit (INDEPENDENT_AMBULATORY_CARE_PROVIDER_SITE_OTHER): Payer: 59 | Admitting: Pulmonary Disease

## 2020-04-02 ENCOUNTER — Encounter (HOSPITAL_BASED_OUTPATIENT_CLINIC_OR_DEPARTMENT_OTHER): Payer: Self-pay | Admitting: *Deleted

## 2020-04-02 ENCOUNTER — Telehealth (INDEPENDENT_AMBULATORY_CARE_PROVIDER_SITE_OTHER): Payer: 59 | Admitting: Physician Assistant

## 2020-04-02 ENCOUNTER — Other Ambulatory Visit: Payer: Self-pay

## 2020-04-02 ENCOUNTER — Emergency Department (HOSPITAL_BASED_OUTPATIENT_CLINIC_OR_DEPARTMENT_OTHER)
Admission: EM | Admit: 2020-04-02 | Discharge: 2020-04-02 | Disposition: A | Discharge status: Home / Self Care | Payer: 59 | Attending: Emergency Medicine | Admitting: Emergency Medicine

## 2020-04-02 VITALS — HR 105 | Wt 195.0 lb

## 2020-04-02 DIAGNOSIS — E119 Type 2 diabetes mellitus without complications: Secondary | ICD-10-CM | POA: Diagnosis not present

## 2020-04-02 DIAGNOSIS — Y939 Activity, unspecified: Secondary | ICD-10-CM | POA: Diagnosis not present

## 2020-04-02 DIAGNOSIS — Y999 Unspecified external cause status: Secondary | ICD-10-CM | POA: Insufficient documentation

## 2020-04-02 DIAGNOSIS — M25572 Pain in left ankle and joints of left foot: Secondary | ICD-10-CM | POA: Insufficient documentation

## 2020-04-02 DIAGNOSIS — R072 Precordial pain: Secondary | ICD-10-CM

## 2020-04-02 DIAGNOSIS — Y929 Unspecified place or not applicable: Secondary | ICD-10-CM | POA: Diagnosis not present

## 2020-04-02 DIAGNOSIS — Z79899 Other long term (current) drug therapy: Secondary | ICD-10-CM | POA: Diagnosis not present

## 2020-04-02 DIAGNOSIS — W108XXA Fall (on) (from) other stairs and steps, initial encounter: Secondary | ICD-10-CM | POA: Diagnosis not present

## 2020-04-02 DIAGNOSIS — R0602 Shortness of breath: Secondary | ICD-10-CM | POA: Diagnosis not present

## 2020-04-02 DIAGNOSIS — E039 Hypothyroidism, unspecified: Secondary | ICD-10-CM | POA: Diagnosis not present

## 2020-04-02 DIAGNOSIS — M25571 Pain in right ankle and joints of right foot: Secondary | ICD-10-CM | POA: Insufficient documentation

## 2020-04-02 LAB — PULMONARY FUNCTION TEST
DL/VA % pred: 105 %
DL/VA: 4.55 ml/min/mmHg/L
DLCO cor % pred: 75 %
DLCO cor: 17.38 ml/min/mmHg
DLCO unc % pred: 75 %
DLCO unc: 17.38 ml/min/mmHg
FEF 25-75 Pre: 3.92 L/sec
FEF2575-%Pred-Pre: 126 %
FEV1-%Pred-Pre: 78 %
FEV1-Pre: 2.45 L
FEV1FVC-%Pred-Pre: 108 %
FEV6-%Pred-Pre: 73 %
FEV6-Pre: 2.78 L
FEV6FVC-%Pred-Pre: 102 %
FVC-%Pred-Pre: 71 %
FVC-Pre: 2.78 L
Pre FEV1/FVC ratio: 88 %
Pre FEV6/FVC Ratio: 100 %

## 2020-04-02 NOTE — ED Triage Notes (Signed)
She fell backward off 3 steps. Injury to both ankles.

## 2020-04-02 NOTE — Progress Notes (Signed)
Please let patient know PFTs showed mild restriction. Diffusion capacity was normal. No evidence of COPD. Follow up with [name] in Sept

## 2020-04-02 NOTE — Progress Notes (Signed)
Virtual Visit via Telephone Note   This visit type was conducted due to national recommendations for restrictions regarding the COVID-19 Pandemic (e.g. social distancing) in an effort to limit this patient's exposure and mitigate transmission in our community.  Due to her co-morbid illnesses, this patient is at least at moderate risk for complications without adequate follow up.  This format is felt to be most appropriate for this patient at this time.  The patient did not have access to video technology/had technical difficulties with video requiring transitioning to audio format only (telephone).  All issues noted in this document were discussed and addressed.  No physical exam could be performed with this format.  Please refer to the patient's chart for her  consent to telehealth for [facility].   Attempt was made to use the video feature, unfortunately due to technical difficulty, this was switched to a telephone visit.  Date:  04/04/2020   ID:  [name], DOB [birth year], MRN [id number] The patient was identified using 2 identifiers.  Patient Location: Home Provider Location: Home Office  PCP:  Charlane Ferretti, DO  Cardiologist:  Olga Millers, MD  Electrophysiologist:  None   Evaluation Performed:  Follow-Up Visit  Chief Complaint:  Follow up  History of Present Illness:    [name] is a 44 y.o. female with past medical history of DM 2, interstitial lung disease, hypothyroidism and anxiety.  She is being treated by pulmonology service for hypersensitivity pneumonitis.  She had COVID-19 back in January 2021.  Previous echocardiogram obtained in November 2020 showed normal EF, mild LAE.  CTA obtained in October 2020 showed no PE.  High-resolution CT in November 2020 showed interstitial lung disease.  Patient was seen by Dr. Jens Som in February 2021 who felt her dyspnea is primarily related to interstitial lung disease she was not volume overloaded on physical exam.  She also  had some chest discomfort which was atypical.  Dr. Jens Som did discuss with her about potential possibility of cardiac CT however she wished to be conservative at the time and did not wish to proceed.  Lung biopsy performed in April 2021 suggestive of chronic hypersensitivity pneumonitis with heterogeneous fibrosis and superimposed subacute lung injury.  She had a repeat echocardiogram performed on 01/02/2020 which showed EF 60 to 65%, grade 1 DD, normal RV function and RV size, trivial mitral regurgitation.  Most recent high-resolution CT of the chest obtained on 01/27/2020 continue to demonstrate mild pulmonary fibrosis. Due to worsening shortness of breath, she will has been recently treated with multiple courses of prednisone.  Pulmonary function test performed on 02/22/2020 showed evidence of mild restrictive ventilatory defect with moderately reduced diffusing capacity.  She did a 6-minute ambulatory walk in the pulmonology office and did not require supplemental oxygen.  She underwent a repeat biopsy on 02/29/2020 at [facility].  Recently, she was seen by her specialist at [facility], during the visit, she complained of intermittent chest discomfort both at rest and with exertion.  She was advised to discuss this with cardiology service and to rule out a ischemic component.  According to patient, her intermittent chest pain has been going on for several months now.  This usually goes away by itself.  There does not seems to be any exacerbating factors or alleviating factors.  Deep inspiration, body rotation on palpation does not affected degree of chest discomfort.  We discussed the possibility of coronary CT versus Myoview.  I also discussed the same case with Dr. Jens Som as well.  Although coronary CT was previously recommended, her baseline heart rate has always been in the high 90s to low 100 range, I am not confident that we can slow her heart rate down to the 50s range in order to obtain a clean picture for the  coronary CT.  Therefore I recommended Myoview as initial assessment.  Patient was agreeable to proceed.  The patient does not have symptoms concerning for COVID-19 infection (fever, chills, cough, or new shortness of breath).    Past Medical History:  Diagnosis Date  . Anxiety   . Arthritis   . Depression   . Diabetes mellitus without complication (HCC)   . Dyspnea   . Fibromyalgia   . GERD (gastroesophageal reflux disease)   . Headache    migraines  . Hypothyroidism 1999   post PTU Rx for hyperthyroidism  . IBS (irritable bowel syndrome)   . Interstitial lung disease (HCC)   . Pneumonia 06/2019   Double PNA   Past Surgical History:  Procedure Laterality Date  . CESAREAN SECTION     x 2  . INTERCOSTAL NERVE BLOCK Right 12/12/2019   Procedure: Intercostal Nerve Block;  Surgeon: Delight Ovens, MD;  Location: [facility] OR;  Service: Thoracic;  Laterality: Right;  Marland Kitchen VIDEO BRONCHOSCOPY Bilateral 07/12/2019   Procedure: VIDEO BRONCHOSCOPY WITH FLUORO;  Surgeon: Chilton Greathouse, MD;  Location: [facility] ENDOSCOPY;  Service: Cardiopulmonary;  Laterality: Bilateral;     Current Meds  Medication Sig  . acetaminophen (TYLENOL) 650 MG CR tablet Take 650 mg by mouth every 8 (eight) hours as needed for pain.  . DULoxetine (CYMBALTA) 60 MG capsule TAKE 1 CAPSULE BY MOUTH EVERY DAY (Patient taking differently: Take 60 mg by mouth daily. )  . levothyroxine (SYNTHROID) 125 MCG tablet Take 125 mcg by mouth daily before breakfast.  . metFORMIN (GLUCOPHAGE-XR) 500 MG 24 hr tablet Take 1,000 mg by mouth at bedtime. 500 in AM and 1000 at bedtime  . pantoprazole (PROTONIX) 40 MG tablet Take 2 tablets (80 mg total) by mouth daily.  . predniSONE (DELTASONE) 20 MG tablet TAKE 2 TABLETS (40 MG TOTAL) BY MOUTH DAILY WITH BREAKFAST. (Patient taking differently: Take 10 mg by mouth daily with breakfast. )  . pregabalin (LYRICA) 150 MG capsule Take 1 capsule (150 mg total) by mouth 2 (two) times daily.  Marland Kitchen  sulfamethoxazole-trimethoprim (BACTRIM DS) 800-160 MG tablet Take 1 tablet by mouth 3 (three) times a week.     Allergies:   Methimazole and Bupropion   Social History   Tobacco Use  . Smoking status: Never Smoker  . Smokeless tobacco: Never Used  Vaping Use  . Vaping Use: Never used  Substance Use Topics  . Alcohol use: Yes    Comment: occasional  . Drug use: No     Family Hx: The patient's family history includes ADD / ADHD in her son; Cancer in her paternal grandfather; Diabetes in her father and paternal grandmother; Hyperlipidemia in her mother. There is no history of Other.  ROS:   Please see the history of present illness.     All other systems reviewed and are negative.   Prior CV studies:   The following studies were reviewed today:  Echo 01/02/2020 Left ventricular ejection fraction, by estimation, is 60 to 65%. The  left ventricle has normal function. The left ventricle has no regional  wall motion abnormalities. Left ventricular diastolic parameters are  consistent with Grade I diastolic  dysfunction (impaired relaxation).  2. Right ventricular systolic  function is normal. The right ventricular  size is normal.  3. The mitral valve is normal in structure. Trivial mitral valve  regurgitation. No evidence of mitral stenosis.  4. The aortic valve is normal in structure. Aortic valve regurgitation is  not visualized. No aortic stenosis is present.  5. The inferior vena cava is normal in size with greater than 50%  respiratory variability, suggesting right atrial pressure of 3 mmHg.   Labs/Other Tests and Data Reviewed:    EKG:  An ECG dated 12/08/2019 was personally reviewed today and demonstrated:  Sinus rhythm with no significant ST-T wave changes.  Recent Labs: 05/25/2019: TSH 1.78 12/02/2019: Pro B Natriuretic peptide (BNP) 24.0 12/08/2019: ALT 21 12/13/2019: BUN 9; Creatinine, Ser 0.82; Hemoglobin 11.4; Platelets 241; Potassium 4.1; Sodium 136   Recent  Lipid Panel Lab Results  Component Value Date/Time   CHOL 184 06/23/2018 11:29 AM   TRIG 136.0 06/23/2018 11:29 AM   HDL 71.00 06/23/2018 11:29 AM   CHOLHDL 3 06/23/2018 11:29 AM   LDLCALC 86 06/23/2018 11:29 AM   LDLCALC 113 (H) 01/22/2018 03:46 PM    Wt Readings from Last 3 Encounters:  04/02/20 198 lb (89.8 kg)  04/02/20 195 lb (88.5 kg)  03/14/20 198 lb 6.4 oz (90 kg)     Objective:    Vital Signs:  Pulse (!) 105 Comment: per APPLE watch  Wt 195 lb (88.5 kg)   LMP 03/05/2020   BMI 31.47 kg/m    VITAL SIGNS:  reviewed  ASSESSMENT & PLAN:    1. Intermittent chest pain: I still suspect majority of her chest pain is likely pulmonary in etiology.  Her chest pain does not have clear correlation with physical exertion.  She is always short of breath.  Her baseline heart rate is elevated in the high 90s to 110s range, I am not confident we can lower her heart rate to the 50s range in order to obtain a clean picture for the coronary CT, therefore I recommend a Myoview.   2. Interstitial lung disease: She is being followed by both pulmonologist service here and her specialist at [facility].  She has recently underwent biopsy on 02/29/2020.  She also had COVID-19 back in January  3. DM2: Managed by primary care provider  4. Hypothyroidism: Levothyroxine  COVID-19 Education: The signs and symptoms of COVID-19 were discussed with the patient and how to seek care for testing (follow up with PCP or arrange E-visit).  The importance of social distancing was discussed today.  Time:   Today, I have spent 20 minutes with the patient with telehealth technology discussing the above problems.     Medication Adjustments/Labs and Tests Ordered: Current medicines are reviewed at length with the patient today.  Concerns regarding medicines are outlined above.   Tests Ordered: Orders Placed This Encounter  Procedures  . MYOCARDIAL PERFUSION IMAGING    Medication Changes: No orders of the  defined types were placed in this encounter.   Follow Up:  In Person in 3 month(s)  Signed, Azalee Course, [place]  04/04/2020 11:15 PM    [facility]

## 2020-04-02 NOTE — ED Provider Notes (Addendum)
[facility] EMERGENCY DEPARTMENT Provider Note   CSN: [id number] Arrival date & time: 04/02/20  1116     History Chief Complaint  Patient presents with  . Ankle Injury    [name] is a 44 y.o. female with asthma with history significant for anxiety, depression, diabetes, heart myalgia, GERD, IBS, interstitial lung disease.  HPI Patient states she had a mechanical fall just prior to arrival.  She was trying to quickly get out of her neighbor's front door without letting the cat out and unfortunately forgot there were stairs and she fell backwards.  She states she fell down 3 stairs.  She landed on her backside.  She denies hitting her head or loss of consciousness.  She had immediate pain in bilateral ankles.  She was able to get up with assistance from her husband.  She was able to walk and bear weight on both extremities however did have worsening pain with ambulation in her right leg.  She describes the pain as sharp and throbbing.  She rates the pain 6 out of 10 in severity.  She did take Tylenol and apply ice prior to arrival.    Past Medical History:  Diagnosis Date  . Anxiety   . Arthritis   . Depression   . Diabetes mellitus without complication (HCC)   . Dyspnea   . Fibromyalgia   . GERD (gastroesophageal reflux disease)   . Headache    migraines  . Hypothyroidism 1999   post PTU Rx for hyperthyroidism  . IBS (irritable bowel syndrome)   . Interstitial lung disease (HCC)   . Pneumonia 06/2019   Double PNA    Patient Active Problem List   Diagnosis Date Noted  . Encounter for removal of sutures 12/23/2019  . SOB (shortness of breath) 09/12/2019  . Dehydration 09/12/2019  . COVID-19 virus infection 09/12/2019  . ILD (interstitial lung disease) (HCC)   . Edema 03/25/2019  . Skin lesions 03/25/2019  . 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 Poor concentration 10/15/2015  . Headache 06/20/2015  . PCO (polycystic ovaries) 03/02/2015    . Acne 01/31/2015  . Allergic rhinitis 01/05/2015  . High risk medications (not anticoagulants) long-term use 10/26/2013  . General medical examination 05/20/2011  . Foot pain, right 07/15/2010  . KNEE PAIN, LEFT 05/17/2010  . Depression with anxiety 04/19/2008  . B12 DEFICIENCY 03/17/2008  . Fatigue 02/14/2008  . Hypothyroidism 07/26/2007  . Fibromyalgia 07/26/2007  . IRRITABLE BOWEL SYNDROME, HX OF 07/26/2007    Past Surgical History:  Procedure Laterality Date  . CESAREAN SECTION     x 2  . INTERCOSTAL NERVE BLOCK Right 12/12/2019   Procedure: Intercostal Nerve Block;  Surgeon: Delight Ovens, MD;  Location: [facility] OR;  Service: Thoracic;  Laterality: Right;  Marland Kitchen VIDEO BRONCHOSCOPY Bilateral 07/12/2019   Procedure: VIDEO BRONCHOSCOPY WITH FLUORO;  Surgeon: Chilton Greathouse, MD;  Location: [facility] ENDOSCOPY;  Service: Cardiopulmonary;  Laterality: Bilateral;     OB History   No obstetric history on file.     Family History  Problem Relation Age of Onset  . Diabetes Paternal Grandmother   . Hyperlipidemia Mother   . Diabetes Father   . Cancer Paternal Grandfather        lung  . ADD / ADHD Son   . Other Neg Hx        PCO    Social History   Tobacco Use  . Smoking status: Never Smoker  . Smokeless tobacco:  Never Used  Vaping Use  . Vaping Use: Never used  Substance Use Topics  . Alcohol use: Yes    Comment: occasional  . Drug use: No    Home Medications Prior to Admission medications   Medication Sig Start Date End Date Taking? Authorizing Provider  acetaminophen (TYLENOL) 650 MG CR tablet Take 650 mg by mouth every 8 (eight) hours as needed for pain.    [provider]  DULoxetine (CYMBALTA) 60 MG capsule TAKE 1 CAPSULE BY MOUTH EVERY DAY Patient taking differently: Take 60 mg by mouth daily.  11/14/19   Sheliah Hatchabori, Katherine E, MD  furosemide (LASIX) 20 MG tablet Take 1 tablet (20 mg total) by mouth daily. Patient not taking: Reported on 04/02/2020 01/18/20    Chilton GreathouseMannam, Praveen, MD  glipiZIDE (GLUCOTROL) 5 MG tablet Take 5 mg by mouth daily. Patient not taking: Reported on 04/02/2020 02/01/20   [provider]  levothyroxine (SYNTHROID) 125 MCG tablet Take 125 mcg by mouth daily before breakfast.    [provider]  metFORMIN (GLUCOPHAGE-XR) 500 MG 24 hr tablet Take 1,000 mg by mouth at bedtime. 500 in AM and 1000 at bedtime 11/07/19   [provider]  pantoprazole (PROTONIX) 40 MG tablet Take 2 tablets (80 mg total) by mouth daily. 12/08/19   Delight OvensGerhardt, Edward B, MD  predniSONE (DELTASONE) 20 MG tablet TAKE 2 TABLETS (40 MG TOTAL) BY MOUTH DAILY WITH BREAKFAST. Patient taking differently: Take 10 mg by mouth daily with breakfast.  02/04/20   Mannam, Colbert CoyerPraveen, MD  predniSONE (DELTASONE) 5 MG tablet As directed by provider Patient not taking: Reported on 04/02/2020 12/28/19   Chilton GreathouseMannam, Praveen, MD  pregabalin (LYRICA) 150 MG capsule Take 1 capsule (150 mg total) by mouth 2 (two) times daily. 03/10/19   Sheliah Hatchabori, Katherine E, MD  sulfamethoxazole-trimethoprim (BACTRIM DS) 800-160 MG tablet Take 1 tablet by mouth 3 (three) times a week. 03/28/20   Chilton GreathouseMannam, Praveen, MD    Allergies    Methimazole and Bupropion  Review of Systems   Review of Systems  Constitutional: Negative for chills and fever.  HENT: Negative for congestion, ear discharge, ear pain, sinus pressure, sinus pain and sore throat.   Eyes: Negative for pain and redness.  Respiratory: Negative for cough and shortness of breath.   Cardiovascular: Negative for chest pain.  Gastrointestinal: Negative for abdominal pain, constipation, diarrhea, nausea and vomiting.  Genitourinary: Negative for dysuria and hematuria.  Musculoskeletal: Positive for arthralgias and joint swelling. Negative for back pain, gait problem, neck pain and neck stiffness.  Skin: Negative for wound.  Neurological: Negative for weakness, numbness and headaches.     Physical Exam Updated Vital Signs BP (!)  126/99   Pulse 100   Temp 99 F (37.2 C) (Oral)   Resp 20   Ht 5\' 6"  (1.676 m)   Wt 89.8 kg   LMP 03/05/2020   SpO2 98%   BMI 31.96 kg/m   Physical Exam Vitals and nursing note reviewed.  Constitutional:      Appearance: She is well-developed. She is not ill-appearing or toxic-appearing.  HENT:     Head: Normocephalic and atraumatic.     Comments: No tenderness to palpation of skull. No deformities or crepitus noted. No open wounds, abrasions or lacerations.      Nose: Nose normal.  Eyes:     General: No scleral icterus.       Right eye: No discharge.        Left eye: No discharge.  Conjunctiva/sclera: Conjunctivae normal.  Neck:     Vascular: No JVD.  Cardiovascular:     Rate and Rhythm: Normal rate and regular rhythm.     Pulses: Normal pulses.     Heart sounds: Normal heart sounds.  Pulmonary:     Effort: Pulmonary effort is normal.     Breath sounds: Normal breath sounds.  Abdominal:     General: There is no distension.  Musculoskeletal:     Cervical back: Normal and normal range of motion.     Thoracic back: Normal.     Lumbar back: Normal.       Legs:     Comments: Pelvis is stable.  Tenderness to palpation as depicted in image above. No overlying skin changes, no open wounds. Compartments in bilateral lower extremities are soft.  There is no swelling and tenderness over the right lateral malleolus. No overt deformity. No tenderness over the medial aspect of the ankle. The fifth metatarsal is not tender. The ankle joint is intact without excessive opening on stressing. Tenderness to palpation of right forefoot. No break in skin. Good pedal pulse and cap refill of all toes. Wiggling toes without difficulty.   There is no swelling and tenderness over the left lateral malleolus. No overt deformity. No tenderness over the medial aspect of the ankle. The fifth metatarsal is not tender. The ankle joint is intact without excessive opening on  stressing.  Ambulates with mildly antalgic gait. Able to bear weight on bilateral lower extremities.    Skin:    General: Skin is warm and dry.  Neurological:     Mental Status: She is oriented to person, place, and time.     GCS: GCS eye subscore is 4. GCS verbal subscore is 5. GCS motor subscore is 6.     Comments: Fluent speech, no facial droop.  Psychiatric:        Behavior: Behavior normal.     ED Results / Procedures / Treatments   Labs (all labs ordered are listed, but only abnormal results are displayed) Labs Reviewed - No data to display  EKG None  Radiology DG Tibia/Fibula Right  Result Date: 04/02/2020 CLINICAL DATA:  Pain after fall today. Diffuse right lower leg pain. EXAM: RIGHT TIBIA AND FIBULA - 2 VIEW COMPARISON:  Ankle radiograph earlier today. FINDINGS: Cortical margins of the tibia and fibula are intact. There is no evidence of fracture or other focal bone lesions. Knee alignment is maintained. Mild soft tissue edema. IMPRESSION: Soft tissue edema. No fracture or subluxation of the right lower leg. Electronically Signed   By: Narda Rutherford M.D.   On: 04/02/2020 13:53   DG Ankle Complete Left  Result Date: 04/02/2020 CLINICAL DATA:  Recent fall with ankle pain, initial encounter EXAM: LEFT ANKLE COMPLETE - 3+ VIEW COMPARISON:  None. FINDINGS: There is no evidence of fracture, dislocation, or joint effusion. There is no evidence of arthropathy or other focal bone abnormality. Soft tissues are unremarkable. IMPRESSION: No acute abnormality noted. Electronically Signed   By: Alcide Clever M.D.   On: 04/02/2020 12:14   DG Ankle Complete Right  Result Date: 04/02/2020 CLINICAL DATA:  Recent fall with right ankle pain, initial encounter EXAM: RIGHT ANKLE - COMPLETE 3+ VIEW COMPARISON:  None. FINDINGS: There is no evidence of fracture, dislocation, or joint effusion. There is no evidence of arthropathy or other focal bone abnormality. Soft tissues are unremarkable.  IMPRESSION: No acute abnormality noted. Electronically Signed   By: Alcide Clever  M.D.   On: 04/02/2020 12:14   DG Foot Complete Right  Result Date: 04/02/2020 CLINICAL DATA:  Right foot pain and bruising due to an injury suffered in fall today. Initial encounter. EXAM: RIGHT FOOT COMPLETE - 3+ VIEW COMPARISON:  None. FINDINGS: There is no evidence of fracture or dislocation. There is no evidence of arthropathy or other focal bone abnormality. Soft tissues are unremarkable. IMPRESSION: Negative exam. Electronically Signed   By: Drusilla Kanner M.D.   On: 04/02/2020 13:53    Procedures Procedures (including critical care time)  Medications Ordered in ED Medications - No data to display  ED Course  I have reviewed the triage vital signs and the nursing notes.  Pertinent labs & imaging results that were available during my care of the patient were reviewed by me and considered in my medical decision making (see chart for details).    MDM Rules/Calculators/A&P                          History provided by patient with additional history obtained from chart review.    Patient presents to the ED with complaints of pain to the bilateral ankles s/p injury mechanical fall. Exam without obvious deformity or open wounds.  No signs of serious head neck or back injuries.  ROM of all extremities intact. Tender to palpation of right distal tib fib, right lateral malleolus, and forefoot. NVI distally. Xrays negative for fracture/dislocation. Therapeutic splint provided, she has crutches at home.  She took Tylenol prior to arrival and declines need for any analgesics. PRICE and tylenol recommended. I discussed results, treatment plan, need for follow-up, and return precautions with the patient. Provided opportunity for questions, patient confirmed understanding and are in agreement with plan.  Recommend Ortho follow-up if pain persists.  The patient appears reasonably screened and/or stabilized for discharge  and I doubt any other medical condition or other [facility] requiring further screening, evaluation, or treatment in the ED at this time prior to discharge. The patient is safe for discharge with strict return precautions discussed. Recommend pcp follow up.   Portions of this note were generated with Scientist, clinical (histocompatibility and immunogenetics). Dictation errors may occur despite best attempts at proofreading.    Final Clinical Impression(s) / ED Diagnoses Final diagnoses:  Fall (on) (from) other stairs and steps, initial encounter    Rx / DC Orders ED Discharge Orders    None       Kathyrn Lass 04/02/20 1413    Terrilee Files, MD 04/02/20 2133    Sherene Sires, PA-C 04/03/20 0933    Terrilee Files, MD 04/03/20 [phone]

## 2020-04-02 NOTE — Progress Notes (Signed)
Spirometry and Dlco done today. 

## 2020-04-02 NOTE — Patient Instructions (Signed)
Medication Instructions:  The current medical regimen is effective;  continue present plan and medications as directed. Please refer to the Current Medication list given to you today. *If you need a refill on your cardiac medications before your next appt  Testing/Procedures: Your physician has requested that you have a lexiscan myoview. A cardiac stress test is a cardiological test that measures the heart's ability to respond to external stress in a controlled clinical environment. The stress response is induced by intravenous pharmacological stimulation.   Follow-Up: Your next appointment:  6 month(s) Please call our office 2 months in advance to schedule this appointment In Person with Chilton Si, MD  At [facility], you and your health needs are our priority.  As part of our continuing mission to provide you with exceptional heart care, we have created designated Provider Care Teams.  These Care Teams include your primary Cardiologist (physician) and Advanced Practice Providers (APPs -  Physician Assistants and Nurse Practitioners) who all work together to provide you with the care you need, when you need it.

## 2020-04-02 NOTE — Discharge Instructions (Addendum)
Follow up with the orthopedist Dr. Jordan Likes if you continue to have pain  Patient not show any broken bones.  It is likely you have an ankle sprain  And wear the boot and use her crutches until you are able to fully bear weight and walk normally on your right leg.  Pains can take 4 to 6 weeks to get better.  Continue to take Tylenol ibuprofen as needed for pain.

## 2020-04-03 ENCOUNTER — Telehealth (HOSPITAL_COMMUNITY): Payer: Self-pay | Admitting: *Deleted

## 2020-04-03 NOTE — Telephone Encounter (Signed)
Labs and note from [name] routed to Dr. Thornell Mule as requested by [name].  Nothing further needed at this time.

## 2020-04-03 NOTE — Telephone Encounter (Signed)
Agree. t think replacement of deficiency may help with her symptoms.   Can you please forward results and this note to Dr. Thornell Mule for review and management,

## 2020-04-03 NOTE — Progress Notes (Signed)
[name] - This is a patient of yours seen at [facility] by Dr. Darrick Penna for second opinion for ILD vs covid long haul. During last visit she continued to have respiratory symptoms. She is currently on prednisone 10mg  daily, feels no difference in breathing on/off steroids. She had no benefit from ICS/LABA inhaler or SABA.   She had PFTs while in the middle of prednisone burst. Her numbers do appear some better compared to previous from last month done at [facility]. Do you want her to stay on steriod?

## 2020-04-03 NOTE — Telephone Encounter (Signed)
Patient given detailed instructions per Myocardial Perfusion Study Information Sheet for the test on 04/05/20. Patient notified to arrive 15 minutes early and that it is imperative to arrive on time for appointment to keep from having the test rescheduled. ° If you need to cancel or reschedule your appointment, please call the office within 24 hours of your appointment. . Patient verbalized understanding. Talayla Doyel Jacqueline ° ° ° °

## 2020-04-04 ENCOUNTER — Telehealth: Payer: Self-pay | Admitting: Pulmonary Disease

## 2020-04-04 NOTE — Telephone Encounter (Signed)
Plan to call patient on 04/05/2020 per her request.

## 2020-04-05 ENCOUNTER — Ambulatory Visit (HOSPITAL_COMMUNITY): Payer: 59 | Attending: Cardiovascular Disease

## 2020-04-05 ENCOUNTER — Other Ambulatory Visit: Payer: Self-pay

## 2020-04-05 DIAGNOSIS — R072 Precordial pain: Secondary | ICD-10-CM | POA: Insufficient documentation

## 2020-04-05 LAB — MYOCARDIAL PERFUSION IMAGING
LV dias vol: 57 mL (ref 46–106)
LV sys vol: 21 mL
Peak HR: 120 {beats}/min
Rest HR: 90 {beats}/min
SDS: 0
SRS: 0
SSS: 0
TID: 0.8

## 2020-04-05 MED ORDER — TECHNETIUM TC 99M TETROFOSMIN IV KIT
10.6000 | PACK | Freq: Once | INTRAVENOUS | Status: AC | PRN
  Administered 2020-04-05: 10.6 via INTRAVENOUS
  Filled 2020-04-05: qty 11

## 2020-04-05 MED ORDER — REGADENOSON 0.4 MG/5ML IV SOLN
0.4000 mg | Freq: Once | INTRAVENOUS | Status: AC
  Administered 2020-04-05: 0.4 mg via INTRAVENOUS

## 2020-04-05 MED ORDER — TECHNETIUM TC 99M TETROFOSMIN IV KIT
31.8000 | PACK | Freq: Once | INTRAVENOUS | Status: AC | PRN
  Administered 2020-04-05: 31.8 via INTRAVENOUS
  Filled 2020-04-05: qty 32

## 2020-04-05 NOTE — Telephone Encounter (Signed)
[name] please advise;   Also- I am done with the "burst" of prednisone at 40mg  since Monday 8/8. I'm now down to 10mg  & awaiting treatment options from [facility] at the moment.  Thank you!  [phone]

## 2020-04-05 NOTE — Telephone Encounter (Signed)
Please let patient know PFTs showed mild restriction. Diffusion capacity was normal. No evidence of COPD. Follow up with [name] in Sept   Spoke with pt and notified of results per [facility].  Pt verbalized understanding.

## 2020-04-06 NOTE — Telephone Encounter (Signed)
I can sign placard. She can taper prednisone to 5mg  daily x 2 weeks and then stop.   CC: Dr. 

## 2020-04-06 NOTE — Telephone Encounter (Signed)
Patient sent email regarding a handicap placard and getting is signed before [name] returns to office on 04/17/20.  She sent another email today   If you can check w/ him on it prior-- possibly another dr can sign for it? Also- I need him to review treatment plan for prednisone. Stay on 10mg  or come off. I've notified [facility], Dr to also review (as of today)- due to [facility] is out until 8/24 & I can't wait that long. unless 9/24 can suggest on Clent Ridges.   Thank you- Laurajean Hosek [phone]  Dr. [phone] or Beth Please advise on placard and prednisone

## 2020-04-11 ENCOUNTER — Ambulatory Visit (INDEPENDENT_AMBULATORY_CARE_PROVIDER_SITE_OTHER): Payer: 59 | Admitting: Family Medicine

## 2020-04-11 ENCOUNTER — Ambulatory Visit: Payer: Self-pay

## 2020-04-11 ENCOUNTER — Encounter: Payer: Self-pay | Admitting: Family Medicine

## 2020-04-11 ENCOUNTER — Other Ambulatory Visit: Payer: Self-pay

## 2020-04-11 VITALS — BP 120/80 | Ht 66.0 in | Wt 193.0 lb

## 2020-04-11 DIAGNOSIS — S93601A Unspecified sprain of right foot, initial encounter: Secondary | ICD-10-CM | POA: Diagnosis not present

## 2020-04-11 DIAGNOSIS — M79671 Pain in right foot: Secondary | ICD-10-CM

## 2020-04-11 NOTE — Patient Instructions (Signed)
Nice to meet you Please continue ice  Please try the boot at the end of the day  Please try the exercises   Please send me a message in MyChart with any questions or updates.  Please see me back in 2 weeks.   --Dr. Jordan Likes

## 2020-04-11 NOTE — Progress Notes (Signed)
Medication Samples have been provided to the patient.  Drug name: Rayos       Strength: 5mg         Qty: 1 box  LOT: E  Exp.Date: 06/2020  Dosing instructions: Take 1 tablet by mouth as close to 10:00 pm as possible.  The patient has been instructed regarding the correct time, dose, and frequency of taking this medication, including desired effects and most common side effects.   07/2020, MA 12:12 PM 04/11/2020

## 2020-04-11 NOTE — Progress Notes (Signed)
[name] - 44 y.o. female MRN [id number]  Date of birth: 1975-10-23  SUBJECTIVE:  Including CC & ROS.  Chief Complaint  Patient presents with  . Ankle Injury    right x 04/02/2020    [name] is a 44 y.o. female that is presenting with right foot pain.  She sustained an injury where she fell a few stairs on 8/9.  Since that time she has had ongoing right lateral midfoot pain.  X-rays are negative for fracture.  Still has some pain with weightbearing it tends to be worse at the end of the day.  Has been trying a home compression wrap.  Did use a cam walker for a few days..  Independent review of the right tibia and fibula x-ray from 8/9 shows no acute bony abnormality.  Independent review of the right foot x-ray from 8/9 shows no acute changes.  Independent review of the right and left ankle x-ray from 8/9 shows no acute changes.   Review of Systems See HPI   HISTORY: Past Medical, Surgical, Social, and Family History Reviewed & Updated per EMR.   Pertinent Historical Findings include:  Past Medical History:  Diagnosis Date  . Anxiety   . Arthritis   . Depression   . Diabetes mellitus without complication (HCC)   . Dyspnea   . Fibromyalgia   . GERD (gastroesophageal reflux disease)   . Headache    migraines  . Hypothyroidism 1999   post PTU Rx for hyperthyroidism  . IBS (irritable bowel syndrome)   . Interstitial lung disease (HCC)   . Pneumonia 06/2019   Double PNA    Past Surgical History:  Procedure Laterality Date  . CESAREAN SECTION     x 2  . INTERCOSTAL NERVE BLOCK Right 12/12/2019   Procedure: Intercostal Nerve Block;  Surgeon: Delight Ovens, MD;  Location: [facility] OR;  Service: Thoracic;  Laterality: Right;  Marland Kitchen VIDEO BRONCHOSCOPY Bilateral 07/12/2019   Procedure: VIDEO BRONCHOSCOPY WITH FLUORO;  Surgeon: Chilton Greathouse, MD;  Location: [facility] ENDOSCOPY;  Service: Cardiopulmonary;  Laterality: Bilateral;    Family History  Problem Relation Age of Onset  .  Diabetes Paternal Grandmother   . Hyperlipidemia Mother   . Diabetes Father   . Cancer Paternal Grandfather        lung  . ADD / ADHD Son   . Other Neg Hx        PCO    Social History   Socioeconomic History  . Marital status: Married    Spouse name: Not on file  . Number of children: 2  . Years of education: Not on file  . Highest education level: Not on file  Occupational History  . Occupation: Landscape architect: [employer]  Tobacco Use  . Smoking status: Never Smoker  . Smokeless tobacco: Never Used  Vaping Use  . Vaping Use: Never used  Substance and Sexual Activity  . Alcohol use: Yes    Comment: occasional  . Drug use: No  . Sexual activity: Not on file  Other Topics Concern  . Not on file  Social History Narrative  . Not on file   Social Determinants of Health   Financial Resource Strain:   . Difficulty of Paying Living Expenses:   Food Insecurity:   . Worried About Programme researcher, broadcasting/film/video in the Last Year:   . Barista in the Last Year:   Transportation Needs:   . Freight forwarder (Medical):   Marland Kitchen  Lack of Transportation (Non-Medical):   Physical Activity:   . Days of Exercise per Week:   . Minutes of Exercise per Session:   Stress:   . Feeling of Stress :   Social Connections:   . Frequency of Communication with Friends and Family:   . Frequency of Social Gatherings with Friends and Family:   . Attends Religious Services:   . Active Member of Clubs or Organizations:   . Attends Banker Meetings:   Marland Kitchen Marital Status:   Intimate Partner Violence:   . Fear of Current or Ex-Partner:   . Emotionally Abused:   Marland Kitchen Physically Abused:   . Sexually Abused:      PHYSICAL EXAM:  VS: BP 120/80   Ht 5\' 6"  (1.676 m)   Wt 193 lb (87.5 kg)   BMI 31.15 kg/m  Physical Exam Gen: NAD, alert, cooperative with exam, well-appearing MSK:  Right foot: Ecchymosis occurring over the toes and dorsum of the forefoot. Tenderness  palpation over the cuboid. Normal ankle range of motion. Normal strength resistance. Able to bear weight with minor pain. Neurovascularly intact  Limited ultrasound: Right foot:  No effusion of the ankle joint. Effusion that is deep to the peroneal tendons around the lateral malleolus. Normal insertion of the peroneal brevis into the base of the fifth. There is hyperemia associated with the cuboid where the peroneal brevis transverses over it.  Possible for sprain. No changes of the tarsometatarsal joints.  Summary: Findings would suggest a foot sprain.  Concern for possible cuboid fracture.  Ultrasound and interpretation by , MD    ASSESSMENT & PLAN:   Sprain of right foot Injury occurred on 8/9.  Concern for changes over the cuboid.  Seems more of a sprain at this time as she does have the ability to bear weight without significant pain.  She did have longstanding prednisone use so concern for occult fracture. -Counseled on partial weightbearing and use of the cam walker. -Provided Rayos sample. -Counseled on home exercise therapy and supportive care. -If pain worsens then I would have low threshold for ordering the MRI.

## 2020-04-11 NOTE — Assessment & Plan Note (Signed)
Injury occurred on 8/9.  Concern for changes over the cuboid.  Seems more of a sprain at this time as she does have the ability to bear weight without significant pain.  She did have longstanding prednisone use so concern for occult fracture. -Counseled on partial weightbearing and use of the cam walker. -Provided Rayos sample. -Counseled on home exercise therapy and supportive care. -If pain worsens then I would have low threshold for ordering the MRI.

## 2020-04-16 NOTE — Progress Notes (Signed)
Thank you. Triage please let patient know I discussed with [name] he agreed ok to taper off prednisone as small change in PFTs are not significant and clinically she feels the same off/on steriods. Please tell her to taper down prednisone 5mg  weekly until off.

## 2020-04-28 ENCOUNTER — Other Ambulatory Visit: Payer: Self-pay | Admitting: Pulmonary Disease

## 2020-05-01 DIAGNOSIS — J849 Interstitial pulmonary disease, unspecified: Secondary | ICD-10-CM

## 2020-05-01 NOTE — Telephone Encounter (Signed)
Dr Isaiah Serge, please advise on pt email thanks   *-*-*This message has not been handled.*-*-*  To compare against the prior CT I had done, to prepare for my [facility] follow up- I'd like to request a follow up CT scan of my lungs.  Symptoms still to date: Shortness of Breath, chest pain, unproductive coughing (w/ tightness of chest & deep pressure), extreme exhaustion, extreme body aches & pains- from neck to toes, daily headaches, brain fog, lack of concentration.  Please advise. Thank you in advance.  [name] [phone]

## 2020-05-02 ENCOUNTER — Ambulatory Visit (INDEPENDENT_AMBULATORY_CARE_PROVIDER_SITE_OTHER): Payer: 59 | Admitting: Family Medicine

## 2020-05-02 ENCOUNTER — Ambulatory Visit: Payer: 59 | Admitting: Family Medicine

## 2020-05-02 ENCOUNTER — Encounter: Payer: Self-pay | Admitting: Family Medicine

## 2020-05-02 ENCOUNTER — Ambulatory Visit (HOSPITAL_BASED_OUTPATIENT_CLINIC_OR_DEPARTMENT_OTHER)
Admission: RE | Admit: 2020-05-02 | Discharge: 2020-05-02 | Disposition: A | Discharge status: Home / Self Care | Payer: 59 | Source: Ambulatory Visit | Attending: Family Medicine | Admitting: Family Medicine

## 2020-05-02 ENCOUNTER — Other Ambulatory Visit: Payer: Self-pay

## 2020-05-02 VITALS — BP 138/90 | Ht 66.0 in | Wt 190.0 lb

## 2020-05-02 DIAGNOSIS — S93601D Unspecified sprain of right foot, subsequent encounter: Secondary | ICD-10-CM

## 2020-05-02 NOTE — Assessment & Plan Note (Signed)
Still having swelling and tenderness over the cuboid.  Concern for possible occult fracture with her long history of prednisone use. -Counseled on supportive care and CAM Walker use. -X-ray. -May need to consider MRI to evaluate for occult fracture.

## 2020-05-02 NOTE — Telephone Encounter (Signed)
  Ok to order high res CT for follow up of ILD.  Can we request a CD of the images to be delivered to patient home so she can take it to her [facility] appointment?  When is the appointment at [facility]?  Ensure atleast 2 weeks from CT to appointment for the CD to reach the patient.

## 2020-05-02 NOTE — Progress Notes (Signed)
[name] - 44 y.o. female MRN [id number]  Date of birth: [birth year]  SUBJECTIVE:  Including CC & ROS.  Chief Complaint  Patient presents with  . Follow-up    right foot    [name] is a 44 y.o. female that is presenting with acute worsening of her right foot pain.  She is having pain and swelling over the cuboid.  Does get improvement with the cam walker.  Has pain with any ambulation not in the cam walker.   Review of Systems See HPI   HISTORY: Past Medical, Surgical, Social, and Family History Reviewed & Updated per EMR.   Pertinent Historical Findings include:  Past Medical History:  Diagnosis Date  . Anxiety   . Arthritis   . Depression   . Diabetes mellitus without complication (HCC)   . Dyspnea   . Fibromyalgia   . GERD (gastroesophageal reflux disease)   . Headache    migraines  . Hypothyroidism 1999   post PTU Rx for hyperthyroidism  . IBS (irritable bowel syndrome)   . Interstitial lung disease (HCC)   . Pneumonia 06/2019   Double PNA    Past Surgical History:  Procedure Laterality Date  . CESAREAN SECTION     x 2  . INTERCOSTAL NERVE BLOCK Right 12/12/2019   Procedure: Intercostal Nerve Block;  Surgeon: Delight Ovens, MD;  Location: [facility] OR;  Service: Thoracic;  Laterality: Right;  Marland Kitchen VIDEO BRONCHOSCOPY Bilateral 07/12/2019   Procedure: VIDEO BRONCHOSCOPY WITH FLUORO;  Surgeon: Chilton Greathouse, MD;  Location: [facility] ENDOSCOPY;  Service: Cardiopulmonary;  Laterality: Bilateral;    Family History  Problem Relation Age of Onset  . Diabetes Paternal Grandmother   . Hyperlipidemia Mother   . Diabetes Father   . Cancer Paternal Grandfather        lung  . ADD / ADHD Son   . Other Neg Hx        PCO    Social History   Socioeconomic History  . Marital status: Married    Spouse name: Not on file  . Number of children: 2  . Years of education: Not on file  . Highest education level: Not on file  Occupational History  . Occupation: Solicitor: [employer]  Tobacco Use  . Smoking status: Never Smoker  . Smokeless tobacco: Never Used  Vaping Use  . Vaping Use: Never used  Substance and Sexual Activity  . Alcohol use: Yes    Comment: occasional  . Drug use: No  . Sexual activity: Not on file  Other Topics Concern  . Not on file  Social History Narrative  . Not on file   Social Determinants of Health   Financial Resource Strain:   . Difficulty of Paying Living Expenses: Not on file  Food Insecurity:   . Worried About Programme researcher, broadcasting/film/video in the Last Year: Not on file  . Ran Out of Food in the Last Year: Not on file  Transportation Needs:   . Lack of Transportation (Medical): Not on file  . Lack of Transportation (Non-Medical): Not on file  Physical Activity:   . Days of Exercise per Week: Not on file  . Minutes of Exercise per Session: Not on file  Stress:   . Feeling of Stress : Not on file  Social Connections:   . Frequency of Communication with Friends and Family: Not on file  . Frequency of Social Gatherings with Friends and Family: Not on file  .  Attends Religious Services: Not on file  . Active Member of Clubs or Organizations: Not on file  . Attends Banker Meetings: Not on file  . Marital Status: Not on file  Intimate Partner Violence:   . Fear of Current or Ex-Partner: Not on file  . Emotionally Abused: Not on file  . Physically Abused: Not on file  . Sexually Abused: Not on file     PHYSICAL EXAM:  VS: BP 138/90   Ht 5\' 6"  (1.676 m)   Wt 190 lb (86.2 kg)   LMP 04/08/2020   BMI 30.67 kg/m  Physical Exam Gen: NAD, alert, cooperative with exam, well-appearing MSK:  Right foot: No ecchymosis but swelling appearing of the cuboid. Normal ankle range of motion. Able to stand and bear weight. Neurovascularly intact     ASSESSMENT & PLAN:   Sprain of right foot Still having swelling and tenderness over the cuboid.  Concern for possible occult fracture with her  long history of prednisone use. -Counseled on supportive care and CAM Walker use. -X-ray. -May need to consider MRI to evaluate for occult fracture.

## 2020-05-03 ENCOUNTER — Telehealth: Payer: Self-pay | Admitting: Family Medicine

## 2020-05-03 DIAGNOSIS — S93601D Unspecified sprain of right foot, subsequent encounter: Secondary | ICD-10-CM

## 2020-05-03 NOTE — Telephone Encounter (Signed)
Having pain is ongoing and negative x-rays.  Has a long history of steroid use and concern for occult cuboid fracture given clinical exam.  Myra Rude, MD [facility] 05/03/2020, 8:23 AM

## 2020-05-04 ENCOUNTER — Encounter: Payer: Self-pay | Admitting: Family Medicine

## 2020-05-04 NOTE — Telephone Encounter (Signed)
HRCT order has been placed and also put in order that patient needs a copy of scan for her appointment at [facility]. Nothing further needed at this time.

## 2020-05-08 ENCOUNTER — Encounter: Payer: Self-pay | Admitting: Family Medicine

## 2020-05-08 ENCOUNTER — Other Ambulatory Visit (HOSPITAL_BASED_OUTPATIENT_CLINIC_OR_DEPARTMENT_OTHER): Payer: Self-pay | Admitting: Family Medicine

## 2020-05-08 DIAGNOSIS — S93601D Unspecified sprain of right foot, subsequent encounter: Secondary | ICD-10-CM

## 2020-05-09 ENCOUNTER — Other Ambulatory Visit: Payer: Self-pay

## 2020-05-09 ENCOUNTER — Ambulatory Visit (HOSPITAL_BASED_OUTPATIENT_CLINIC_OR_DEPARTMENT_OTHER)
Admission: RE | Admit: 2020-05-09 | Discharge: 2020-05-09 | Disposition: A | Discharge status: Home / Self Care | Payer: 59 | Source: Ambulatory Visit | Attending: Primary Care | Admitting: Primary Care

## 2020-05-09 DIAGNOSIS — J849 Interstitial pulmonary disease, unspecified: Secondary | ICD-10-CM | POA: Diagnosis present

## 2020-05-09 NOTE — Progress Notes (Signed)
ILD patient of yours, you ordered this repeat HRCT. She is following with [facility] and should be bringing image to them

## 2020-05-14 NOTE — Progress Notes (Signed)
Discharge Progress Report  Patient Details  Name: [name] MRN: [id number] Date of Birth: [birth year] Referring Provider:     Pulmonary Rehab Walk Test from 01/16/2020 in [place]  Referring Provider Dr. Vaughan Browner       Number of Visits: 14  Reason for Discharge:  Patient reached a stable level of exercise. Patient independent in their exercise. Patient has met program and personal goals.  Smoking History:  Social History   Tobacco Use  Smoking Status Never Smoker  Smokeless Tobacco Never Used    Diagnosis:  Interstitial lung disease ([place])  ADL [facility]:  Pulmonary Assessment Scores    Row Name 01/16/20 1144 01/16/20 1203 03/22/20 1513     ADL [facility]   ADL Phase Entry Entry Exit   SOB Score total 75 -- 76     CAT Score   CAT Score 24 -- 28     mMRC Score   mMRC Score -- 4 3          Initial Exercise Prescription:  Initial Exercise Prescription - 01/16/20 1200      Date of Initial Exercise RX and Referring Provider   Date 01/16/20    Referring Provider Dr. Vaughan Browner      Treadmill   MPH 2    Grade 0    Minutes 15      T5 Nustep   Level 2    SPM 80    Minutes 15      Prescription Details   Frequency (times per week) 2    Duration Progress to 30 minutes of continuous aerobic without signs/symptoms of physical distress      Intensity   THRR 40-80% of Max Heartrate 70-141    Ratings of Perceived Exertion 11-13    Perceived Dyspnea 0-4      Progression   Progression Continue to progress workloads to maintain intensity without signs/symptoms of physical distress.      Resistance Training   Training Prescription Yes    Weight orange bands    Reps 10-15           Discharge Exercise Prescription (Final Exercise Prescription Changes):  Exercise Prescription Changes - 03/13/20 1449      Response to Exercise   Blood Pressure (Admit) 140/80    Blood Pressure (Exit) 122/76    Heart Rate (Admit) 112 bpm    Heart Rate  (Exercise) 130 bpm    Heart Rate (Exit) 118 bpm    Oxygen Saturation (Admit) 97 %    Oxygen Saturation (Exercise) 95 %    Oxygen Saturation (Exit) 94 %    Rating of Perceived Exertion (Exercise) 13    Perceived Dyspnea (Exercise) 3    Duration Continue with 30 min of aerobic exercise without signs/symptoms of physical distress.    Intensity THRR unchanged      Progression   Progression Continue to progress workloads to maintain intensity without signs/symptoms of physical distress.      Resistance Training   Training Prescription Yes    Weight orange bands    Reps 10-15      Treadmill   MPH 2.2    Grade 1    Minutes 15      T5 Nustep   Level 4    SPM 80    Minutes 15    METs 2.2           Functional Capacity:  6 Minute Walk    Row Name 01/16/20 1203 03/22/20  1513       6 Minute Walk   Phase Initial Discharge    Distance 1254 feet 1466 feet    Distance % Change -- 16.91 %    Distance Feet Change -- 212 ft    Walk Time 6 minutes 6 minutes    # of Rest Breaks 0 0    MPH 2.38 2.78    METS 4.27 4.99    RPE 12 11    Perceived Dyspnea  3 0    VO2 Peak 14.93 17.47    Symptoms No No    Resting HR 97 bpm 120 bpm    Resting BP 110/60 145/94    Resting Oxygen Saturation  98 % 97 %    Exercise Oxygen Saturation  during 6 min walk 93 % 94 %    Max Ex. HR 133 bpm 138 bpm    Max Ex. BP 134/80 146/99    2 Minute Post BP 124/74 137/91      Interval HR   1 Minute HR 104 116    2 Minute HR 106 119    3 Minute HR 120 129    4 Minute HR 114 133    5 Minute HR 114 132    6 Minute HR 133 138    2 Minute Post HR 92 118    Interval Heart Rate? Yes Yes      Interval Oxygen   Interval Oxygen? Yes Yes    Baseline Oxygen Saturation % 98 % 97 %    1 Minute Oxygen Saturation % 96 % 96 %    1 Minute Liters of Oxygen 0 L 0 L    2 Minute Oxygen Saturation % 93 % 94 %    2 Minute Liters of Oxygen 0 L 0 L    3 Minute Oxygen Saturation % 95 % 95 %    3 Minute Liters of Oxygen 0  L 0 L    4 Minute Oxygen Saturation % 97 % 95 %    4 Minute Liters of Oxygen 0 L 0 L    5 Minute Oxygen Saturation % 94 % 94 %    5 Minute Liters of Oxygen 0 L 0 L    6 Minute Oxygen Saturation % 95 % 94 %    6 Minute Liters of Oxygen 0 L 0 L    2 Minute Post Oxygen Saturation % 98 % 97 %    2 Minute Post Liters of Oxygen 0 L 0 L           Psychological, QOL, Others - Outcomes: PHQ 2/9: Depression screen [facility] 2/9 03/22/2020 01/16/2020 04/08/2019 03/25/2019 03/10/2019  Decreased Interest 0 0 1 0 1  Down, Depressed, Hopeless 1 0 1 0 1  PHQ - 2 Score 1 0 2 0 2  Altered sleeping 0 0 1 - 0  Tired, decreased energy 3 0 1 - 0  Change in appetite 0 0 1 - 0  Feeling bad or failure about yourself  2 0 0 - 0  Trouble concentrating 2 0 0 - 0  Moving slowly or fidgety/restless 3 0 - - 0  Suicidal thoughts 0 0 0 - 0  PHQ-9 Score 11 0 5 - 2  Difficult doing work/chores Somewhat difficult Not difficult at all Somewhat difficult - Not difficult at all  Some recent data might be hidden    Quality of Life:   Personal Goals: Goals established at orientation with interventions provided  to work toward goal.  Personal Goals and Risk Factors at Admission - 01/16/20 1217      Core Components/Risk Factors/Patient Goals on Admission   Improve shortness of breath with ADL's Yes    Intervention Provide education, individualized exercise plan and daily activity instruction to help decrease symptoms of SOB with activities of daily living.    Expected Outcomes Short Term: Improve cardiorespiratory fitness to achieve a reduction of symptoms when performing ADLs;Long Term: Be able to perform more ADLs without symptoms or delay the onset of symptoms            Personal Goals Discharge:  Goals and Risk Factor Review    Row Name 01/16/20 1217 02/06/20 1328 03/05/20 1419 05/14/20 1522       Core Components/Risk Factors/Patient Goals Review   Personal Goals Review Increase knowledge of respiratory  medications and ability to use respiratory devices properly.;Improve shortness of breath with ADL's;Develop more efficient breathing techniques such as purse lipped breathing and diaphragmatic breathing and practicing self-pacing with activity. Increase knowledge of respiratory medications and ability to use respiratory devices properly.;Improve shortness of breath with ADL's;Develop more efficient breathing techniques such as purse lipped breathing and diaphragmatic breathing and practicing self-pacing with activity. Increase knowledge of respiratory medications and ability to use respiratory devices properly.;Improve shortness of breath with ADL's;Develop more efficient breathing techniques such as purse lipped breathing and diaphragmatic breathing and practicing self-pacing with activity. Increase knowledge of respiratory medications and ability to use respiratory devices properly.;Improve shortness of breath with ADL's;Develop more efficient breathing techniques such as purse lipped breathing and diaphragmatic breathing and practicing self-pacing with activity.    Review -- Just started program, has attended 4 exercise sessions, is doing well, CBG's have been elevated 200-250 pre-exercise, PCP added glypazide.  2 mets on nustep, 2 mph/0 incline on treadmill, doing well. Torrey is exercising on the nustep at level 3 and 2.1 mets, and walking on the treadmill @ 2 mph and 1% incline.  Her stamina and shortness of breath are improving. Gracelynn graduated with the completion of 14 sessions on 7/29. Pt increaed distance on the 6 minute walk test from 1254 to 1466.  Pt completed post pulmonary assessment questionaires.  Pt with increase in correct responses on the pre knowledge test of 16/18 to 17/18. Pulmonary dypsnea scale showed little change from 75 pre to 76 post. Lollie Marrow did answer questions on the post that she did not answer on the pre therefore her pre score may in fact be higher. Pt with slight increase on  her CAT assessment with score of 24 pre and 28 post.    Expected Outcomes -- See admission goals. See admission goals. Pt met her admission goals           Exercise Goals and Review:  Exercise Goals    Row Name 01/16/20 1209 02/07/20 1552 03/06/20 0743         Exercise Goals   Increase Physical Activity Yes Yes Yes     Intervention Provide advice, education, support and counseling about physical activity/exercise needs.;Develop an individualized exercise prescription for aerobic and resistive training based on initial evaluation findings, risk stratification, comorbidities and participant's personal goals. Provide advice, education, support and counseling about physical activity/exercise needs.;Develop an individualized exercise prescription for aerobic and resistive training based on initial evaluation findings, risk stratification, comorbidities and participant's personal goals. Provide advice, education, support and counseling about physical activity/exercise needs.;Develop an individualized exercise prescription for aerobic and resistive training based on initial evaluation  findings, risk stratification, comorbidities and participant's personal goals.     Expected Outcomes Short Term: Attend rehab on a regular basis to increase amount of physical activity.;Long Term: Add in home exercise to make exercise part of routine and to increase amount of physical activity.;Long Term: Exercising regularly at least 3-5 days a week. Short Term: Attend rehab on a regular basis to increase amount of physical activity.;Long Term: Add in home exercise to make exercise part of routine and to increase amount of physical activity.;Long Term: Exercising regularly at least 3-5 days a week. Short Term: Attend rehab on a regular basis to increase amount of physical activity.;Long Term: Add in home exercise to make exercise part of routine and to increase amount of physical activity.;Long Term: Exercising regularly at  least 3-5 days a week.     Increase Strength and Stamina Yes Yes Yes     Intervention Provide advice, education, support and counseling about physical activity/exercise needs.;Develop an individualized exercise prescription for aerobic and resistive training based on initial evaluation findings, risk stratification, comorbidities and participant's personal goals. Provide advice, education, support and counseling about physical activity/exercise needs.;Develop an individualized exercise prescription for aerobic and resistive training based on initial evaluation findings, risk stratification, comorbidities and participant's personal goals. Provide advice, education, support and counseling about physical activity/exercise needs.;Develop an individualized exercise prescription for aerobic and resistive training based on initial evaluation findings, risk stratification, comorbidities and participant's personal goals.     Expected Outcomes Short Term: Increase workloads from initial exercise prescription for resistance, speed, and METs.;Short Term: Perform resistance training exercises routinely during rehab and add in resistance training at home;Long Term: Improve cardiorespiratory fitness, muscular endurance and strength as measured by increased METs and functional capacity (6MWT) Short Term: Increase workloads from initial exercise prescription for resistance, speed, and METs.;Short Term: Perform resistance training exercises routinely during rehab and add in resistance training at home;Long Term: Improve cardiorespiratory fitness, muscular endurance and strength as measured by increased METs and functional capacity (6MWT) Short Term: Increase workloads from initial exercise prescription for resistance, speed, and METs.;Short Term: Perform resistance training exercises routinely during rehab and add in resistance training at home;Long Term: Improve cardiorespiratory fitness, muscular endurance and strength as  measured by increased METs and functional capacity (6MWT)     Able to understand and use rate of perceived exertion (RPE) scale Yes Yes Yes     Intervention Provide education and explanation on how to use RPE scale Provide education and explanation on how to use RPE scale Provide education and explanation on how to use RPE scale     Expected Outcomes Short Term: Able to use RPE daily in rehab to express subjective intensity level;Long Term:  Able to use RPE to guide intensity level when exercising independently Short Term: Able to use RPE daily in rehab to express subjective intensity level;Long Term:  Able to use RPE to guide intensity level when exercising independently Short Term: Able to use RPE daily in rehab to express subjective intensity level;Long Term:  Able to use RPE to guide intensity level when exercising independently     Able to understand and use Dyspnea scale Yes Yes Yes     Intervention Provide education and explanation on how to use Dyspnea scale Provide education and explanation on how to use Dyspnea scale Provide education and explanation on how to use Dyspnea scale     Expected Outcomes Short Term: Able to use Dyspnea scale daily in rehab to express subjective sense of shortness  of breath during exertion;Long Term: Able to use Dyspnea scale to guide intensity level when exercising independently Short Term: Able to use Dyspnea scale daily in rehab to express subjective sense of shortness of breath during exertion;Long Term: Able to use Dyspnea scale to guide intensity level when exercising independently Short Term: Able to use Dyspnea scale daily in rehab to express subjective sense of shortness of breath during exertion;Long Term: Able to use Dyspnea scale to guide intensity level when exercising independently     Knowledge and understanding of Target Heart Rate Range (THRR) Yes Yes Yes     Intervention Provide education and explanation of THRR including how the numbers were predicted  and where they are located for reference Provide education and explanation of THRR including how the numbers were predicted and where they are located for reference Provide education and explanation of THRR including how the numbers were predicted and where they are located for reference     Expected Outcomes Short Term: Able to state/look up THRR;Short Term: Able to use daily as guideline for intensity in rehab;Long Term: Able to use THRR to govern intensity when exercising independently Short Term: Able to state/look up THRR;Short Term: Able to use daily as guideline for intensity in rehab;Long Term: Able to use THRR to govern intensity when exercising independently Short Term: Able to state/look up THRR;Short Term: Able to use daily as guideline for intensity in rehab;Long Term: Able to use THRR to govern intensity when exercising independently     Understanding of Exercise Prescription Yes Yes Yes     Intervention Provide education, explanation, and written materials on patient's individual exercise prescription Provide education, explanation, and written materials on patient's individual exercise prescription Provide education, explanation, and written materials on patient's individual exercise prescription     Expected Outcomes Short Term: Able to explain program exercise prescription;Long Term: Able to explain home exercise prescription to exercise independently Short Term: Able to explain program exercise prescription;Long Term: Able to explain home exercise prescription to exercise independently Short Term: Able to explain program exercise prescription;Long Term: Able to explain home exercise prescription to exercise independently            Exercise Goals Re-Evaluation:  Exercise Goals Re-Evaluation    Row Name 02/07/20 1553 03/06/20 0744           Exercise Goal Re-Evaluation   Exercise Goals Review Increase Strength and Stamina;Increase Physical Activity;Able to understand and use rate  of perceived exertion (RPE) scale;Able to understand and use Dyspnea scale;Knowledge and understanding of Target Heart Rate Range (THRR);Understanding of Exercise Prescription Increase Strength and Stamina;Increase Physical Activity;Able to understand and use rate of perceived exertion (RPE) scale;Able to understand and use Dyspnea scale;Knowledge and understanding of Target Heart Rate Range (THRR);Understanding of Exercise Prescription      Comments Pt has completed 5 exercise sessions. She has a positive attitude and gives good effort. She currently exercises at 1.9 METs on the stepper. Will continue to monitor and progress as able. Pt has completed 10 exercise sessions. She maintains a positive attitude. She is trying to be more active at home as well. She currently exercises at 2.1 METs on the stepper. Will continue to monitor and progress as able.      Expected Outcomes Through exercise at rehab and at home, the patient will decrease shortness of breath with daily activities and feel confident in carrying out an exercise regime at home. Through exercise at rehab and at home, the patient will decrease shortness  of breath with daily activities and feel confident in carrying out an exercise regime at home.             Nutrition & Weight - Outcomes:  Pre Biometrics - 01/16/20 1120      Pre Biometrics   Height 5' 5" (1.651 m)    Weight 89.3 kg    BMI (Calculated) 32.76    Grip Strength 24 kg            Nutrition:  Nutrition Therapy & Goals - 01/26/20 1407      Nutrition Therapy   Diet Carb Modified/generally healthy      Personal Nutrition Goals   Nutrition Goal Improved blood glucose control as evidenced by pt's A1c trending from 7.7 toward less than 7.0.    Personal Goal #2 CBG concentrations in the normal range or as close to normal as is safely possible    Personal Goal #3 Pt able to name foods that affect blood glucose      Intervention Plan   Intervention Prescribe, educate  and counsel regarding individualized specific dietary modifications aiming towards targeted core components such as weight, hypertension, lipid management, diabetes, heart failure and other comorbidities.;Nutrition handout(s) given to patient.    Expected Outcomes Short Term Goal: A plan has been developed with personal nutrition goals set during dietitian appointment.           Nutrition Discharge:  Nutrition Assessments - 03/29/20 1151      Rate Your Plate Scores   Post Score 59           Education Questionnaire Score:  Knowledge Questionnaire Score - 03/22/20 1512      Knowledge Questionnaire Score   Post Score 17/18           Goals reviewed with patient. Cherre Huger, BSN Cardiac and Training and development officer

## 2020-05-14 NOTE — Addendum Note (Signed)
Encounter addended by: Chelsea Aus, RN on: 05/14/2020 3:36 PM  Actions taken: Episode resolved, Flowsheet data copied forward, Flowsheet accepted, Clinical Note Signed

## 2020-05-16 ENCOUNTER — Telehealth: Payer: Self-pay | Admitting: Pulmonary Disease

## 2020-05-16 NOTE — Telephone Encounter (Signed)
Reviewed CT scan with patient. ILD is stable compared to June 2021. Patient is currently off steroids States her breathing is stable  She is due to see Dr. Josph Macho MD at [facility] and is taking the images to review with her We will continue to monitor.  No new recommendations at this point.  Chilton Greathouse MD [facility] Please see [email or web address] for pager details.  05/16/2020, 10:01 AM

## 2020-05-22 ENCOUNTER — Ambulatory Visit: Payer: 59 | Admitting: Pulmonary Disease

## 2020-05-24 ENCOUNTER — Ambulatory Visit: Payer: 59 | Admitting: Neurology

## 2020-05-26 ENCOUNTER — Ambulatory Visit (HOSPITAL_BASED_OUTPATIENT_CLINIC_OR_DEPARTMENT_OTHER): Payer: 59

## 2020-05-26 ENCOUNTER — Encounter (HOSPITAL_BASED_OUTPATIENT_CLINIC_OR_DEPARTMENT_OTHER): Payer: Self-pay

## 2020-05-27 ENCOUNTER — Other Ambulatory Visit: Payer: Self-pay

## 2020-05-27 ENCOUNTER — Ambulatory Visit (INDEPENDENT_AMBULATORY_CARE_PROVIDER_SITE_OTHER): Payer: 59

## 2020-05-27 DIAGNOSIS — M67471 Ganglion, right ankle and foot: Secondary | ICD-10-CM

## 2020-05-27 DIAGNOSIS — W108XXA Fall (on) (from) other stairs and steps, initial encounter: Secondary | ICD-10-CM

## 2020-05-27 DIAGNOSIS — S93601D Unspecified sprain of right foot, subsequent encounter: Secondary | ICD-10-CM

## 2020-05-28 ENCOUNTER — Encounter: Payer: Self-pay | Admitting: Family Medicine

## 2020-06-01 ENCOUNTER — Telehealth (INDEPENDENT_AMBULATORY_CARE_PROVIDER_SITE_OTHER): Payer: 59 | Admitting: Family Medicine

## 2020-06-01 ENCOUNTER — Other Ambulatory Visit: Payer: Self-pay

## 2020-06-01 DIAGNOSIS — S93601D Unspecified sprain of right foot, subsequent encounter: Secondary | ICD-10-CM | POA: Diagnosis not present

## 2020-06-01 NOTE — Progress Notes (Signed)
Virtual Visit via Video Note  I connected with Lurene Shadow on 06/01/20 at 10:50 AM EDT by a video enabled telemedicine application and verified that I am speaking with the correct person using two identifiers.   I discussed the limitations of evaluation and management by telemedicine and the availability of in person appointments. The patient expressed understanding and agreed to proceed.  Patient: work  Physician: office   History of Present Illness:  Ms. Bacorn is a 44 yo F following up for her as right foot MRI.  This was showing a ganglion cyst in 2 different areas.  It was unrevealing for fracture or ligamentous tear.   Observations/Objective:  Gen: NAD, alert, cooperative with exam, well-appearing  Assessment and Plan:  Right foot sprain: MRI was demonstrating a ganglion cyst in 2 different areas.  Appears to have fluid associated with the cuboid but no fracture or change structurally. -Counseled on home exercise therapy and supportive care. -Can consider injection  Follow Up Instructions:    I discussed the assessment and treatment plan with the patient. The patient was provided an opportunity to ask questions and all were answered. The patient agreed with the plan and demonstrated an understanding of the instructions.   The patient was advised to call back or seek an in-person evaluation if the symptoms worsen or if the condition fails to improve as anticipated.    Clare Gandy, MD

## 2020-06-01 NOTE — Assessment & Plan Note (Signed)
MRI was demonstrating a ganglion cyst in 2 different areas.  Appears to have fluid associated with the cuboid but no fracture or change structurally. -Counseled on home exercise therapy and supportive care. -Can consider injection

## 2020-06-20 ENCOUNTER — Telehealth (HOSPITAL_COMMUNITY): Payer: Self-pay | Admitting: Psychiatry

## 2020-09-03 ENCOUNTER — Ambulatory Visit: Payer: 59 | Admitting: Internal Medicine

## 2020-09-03 ENCOUNTER — Other Ambulatory Visit: Payer: Self-pay

## 2020-09-03 ENCOUNTER — Encounter: Payer: Self-pay | Admitting: Internal Medicine

## 2020-09-03 VITALS — BP 128/80 | HR 101 | Ht 66.0 in | Wt 197.8 lb

## 2020-09-03 DIAGNOSIS — Z7952 Long term (current) use of systemic steroids: Secondary | ICD-10-CM

## 2020-09-03 DIAGNOSIS — E039 Hypothyroidism, unspecified: Secondary | ICD-10-CM

## 2020-09-03 MED ORDER — DEXAMETHASONE 1 MG PO TABS: ORAL_TABLET | ORAL | 0 refills | Status: DC

## 2020-09-03 NOTE — Patient Instructions (Addendum)
Please continue Levothyroxine 125 mcg daily.  Take the thyroid hormone every day, with water, at least 30 minutes before breakfast, separated by at least 4 hours from: - acid reflux medications - calcium - iron - multivitamins  Please move coffee with creamer 30 min after levothyroxine. Move Protonix and multivitamins later in the day.  Please take Dexamethasone at ~11 pm the night before coming for labs at 8 am, fasting.  Please come back for a follow-up appointment in 6 months.

## 2020-09-03 NOTE — Progress Notes (Signed)
Patient ID: [name], female   DOB: June 20, 1976, 45 y.o.   MRN: [id number]   This visit occurred during the SARS-CoV-2 public health emergency.  Safety protocols were in place, including screening questions prior to the visit, additional usage of staff PPE, and extensive cleaning of exam room while observing appropriate contact time as indicated for disinfecting solutions.   HPI  Crystal ShadowShelley Mellor is a 45 y.o.-year-old female, referred by her PCP, Dr. Thornell MuleSkakle, for management of hypothyroidism and also to rule out complications of long-term steroid use, r/o Cushing syndrome.  Patient describes that she developed a cough around 01/2019.  This was initially attributed to allergies versus, however, it did not resolve and she started to develop chest pressure and ankle swelling she was investigated for pulmonary embolism >> r/o blood clots but found to have double PNA on imaging. In 06/2019 she was dx'ed with ILD.  After a long treatment and investigative process, including biopsy, and [facility] second opinion, it was deemed that her ILD was related to untreated pneumonia.    She was on long-term steroids (prednisone) between 06/2019- 03/2020. Now only on Symbicort. She developed steroid-induced DM, now improving, after she came off po steroids (latest HbA1c 5.7% in 08/23/2021) and she was able to reduce Metformin to only 1000 mg daily and came off glipizide.  In 08/2020, she developed Covid19, complicated by lingering symptoms -now [facility].   She was very affected by the above events >> started to see a consellor. She feels that this is helping.  Her therapist recommended evaluation for Cushing syndrome and a referral to endocrinology.  Pt. has been dx with hyperthyroidism in 1999 (Graves ds.) >> was on thionamide initially (even during one of her pregnancies) >> spontaneous resolution >> hypothyroidism >> on Levothyroxine 125 mcg.  She takes the  thyroid hormone: - fasting - with coffee and creamer - separated by >30 min from b'fast  - no calcium - + iron at night - + PPIs in am - + multivitamins along with coffee and LT4  I reviewed pt's thyroid tests: 08/23/2021: TSH 1.30 03/14/2020: TSH 1.23 Lab Results  Component Value Date   TSH 1.78 05/25/2019   TSH 0.20 (L) 04/25/2019   TSH 0.11 (L) 03/25/2019   TSH 3.23 01/20/2019   TSH 3.37 08/20/2018   TSH 5.80 (H) 06/23/2018   TSH 2.08 01/22/2018   TSH 2.76 05/27/2017   TSH 1.40 06/23/2016   TSH 1.06 10/15/2015   FREET4 0.95 06/23/2016   FREET4 1.12 06/16/2014   FREET4 1.54 10/03/2010   FREET4 0.4 (L) 11/02/2006   T3FREE 4.2 06/23/2016   T3FREE 3.2 06/16/2014   T3FREE 3.7 10/03/2010   T3FREE 3.7 11/02/2006   Antithyroid antibodies: No results found for: THGAB No components found for: TPOAB  Pt denies: - feeling nodules in neck - dysphagia - choking - SOB with lying down But does have hoarseness.  Pt describes: -Shortness of breath -Chest tightness  She also mentions: - weight gain - wide, white striae on abdomen - fatigue - heat intolerance - depression/anxiety - diarrhea - hair loss, now improved  She has + FH of thyroid disorders in:M - hypothyroidism. No FH of thyroid cancer.  No h/o radiation tx to head or neck. No recent use of iodine supplements.  She also has a history of GERD.  She exercises twice a week with a personal trainer (weights, cardio) 1 and 5 times a week  25 to 45 minutes cardio/respiratory therapy  ROS: Constitutional: + See HPI, + nocturia Eyes: no blurry vision, no xerophthalmia ENT: no sore throat, + see HPI, + decreased hearing Cardiovascular: + CP/+ SOB/no palpitations/leg swelling Respiratory: + All: Cough/SOB/wheezing Gastrointestinal: no N/V/+ D/no C Musculoskeletal: + Both muscle/joint aches Skin: + rashes (after COVID-19)- now improving, + thin hair (after COVID-19)-now improving Neurological: no  tremors/numbness/tingling/dizziness, + headache Psychiatric: + Depression/anxiety + Low libido  Past Medical History:  Diagnosis Date  . Anxiety   . Arthritis   . Depression   . Diabetes mellitus without complication (HCC)   . Dyspnea   . Fibromyalgia   . GERD (gastroesophageal reflux disease)   . Headache    migraines  . Hypothyroidism 1999   post PTU Rx for hyperthyroidism  . IBS (irritable bowel syndrome)   . Interstitial lung disease (HCC)   . Pneumonia 06/2019   Double PNA   Past Surgical History:  Procedure Laterality Date  . CESAREAN SECTION     x 2  . INTERCOSTAL NERVE BLOCK Right 12/12/2019   Procedure: Intercostal Nerve Block;  Surgeon: Delight Ovens, MD;  Location: [facility] OR;  Service: Thoracic;  Laterality: Right;  Marland Kitchen VIDEO BRONCHOSCOPY Bilateral 07/12/2019   Procedure: VIDEO BRONCHOSCOPY WITH FLUORO;  Surgeon: Chilton Greathouse, MD;  Location: [facility] ENDOSCOPY;  Service: Cardiopulmonary;  Laterality: Bilateral;   Social History   Socioeconomic History  . Marital status: Married    Spouse name: Not on file  . Number of children: 2  . Years of education: Not on file  . Highest education level: Not on file  Occupational History  . Occupation: Landscape architect: [employer]  Tobacco Use  . Smoking status: Never Smoker  . Smokeless tobacco: Never Used  Vaping Use  . Vaping Use: Never used  Substance and Sexual Activity  . Alcohol use: Yes    Comment: occasional  . Drug use: No  . Sexual activity: Not on file  Other Topics Concern  . Not on file  Social History Narrative  . Not on file   Social Determinants of Health   Financial Resource Strain: Not on file  Food Insecurity: Not on file  Transportation Needs: Not on file  Physical Activity: Not on file  Stress: Not on file  Social Connections: Not on file  Intimate Partner Violence: Not on file   Current Outpatient Medications on File Prior to Visit  Medication Sig Dispense Refill  .  acetaminophen (TYLENOL) 650 MG CR tablet Take 650 mg by mouth every 8 (eight) hours as needed for pain.    Marland Kitchen albuterol (VENTOLIN HFA) 108 (90 Base) MCG/ACT inhaler Inhale into the lungs.    . DULoxetine (CYMBALTA) 60 MG capsule TAKE 1 CAPSULE BY MOUTH EVERY DAY (Patient taking differently: Take 60 mg by mouth daily.) 90 capsule 2  . pregabalin (LYRICA) 150 MG capsule Take 1 capsule (150 mg total) by mouth 2 (two) times daily. 60 capsule 3  . SYMBICORT 80-4.5 MCG/ACT inhaler Inhale into the lungs.    Marland Kitchen levothyroxine (SYNTHROID) 125 MCG tablet Take 125 mcg by mouth daily before breakfast.    . metFORMIN (GLUCOPHAGE-XR) 500 MG 24 hr tablet Take 500 mg by mouth in the morning and at bedtime.    . pantoprazole (PROTONIX) 40 MG tablet Take 2 tablets (80 mg total) by mouth daily. 30 tablet 11   No current facility-administered medications on file prior to visit.   Allergies  Allergen Reactions  .  Methimazole Other (See Comments)    Body goes into arthritic shock  . Bupropion     Body aches / headaches    Family History  Problem Relation Age of Onset  . Diabetes Paternal Grandmother   . Hyperlipidemia Mother   . Diabetes Father   . Cancer Paternal Grandfather        lung  . ADD / ADHD Son   . Other Neg Hx        PCO    PE: BP 128/80   Pulse (!) 101   Ht 5\' 6"  (1.676 m)   Wt 197 lb 12.8 oz (89.7 kg)   SpO2 96%   BMI 31.93 kg/m  Wt Readings from Last 3 Encounters:  09/03/20 197 lb 12.8 oz (89.7 kg)  05/02/20 190 lb (86.2 kg)  04/11/20 193 lb (87.5 kg)   Constitutional: overweight, in NAD, + increased interscapular fat deposit Eyes: PERRLA, EOMI, no exophthalmos ENT: moist mucous membranes, no thyromegaly, no cervical lymphadenopathy Cardiovascular: tachycardia RR, No MRG Respiratory: CTA B Gastrointestinal: abdomen soft, NT, ND, BS+ Musculoskeletal: no deformities, strength intact in all 4 Skin: moist, warm, no rashes Neurological: no tremor with outstretched hands, DTR  normal in all 4  ASSESSMENT: 1. Hypothyroidism - after tx for Graves ds.  2. Long-term steroid use  PLAN:  1. Patient with long-standing hypothyroidism, on levothyroxine therapy. She has had significant variability in her TFTs in the last years, now with normal TFTs in the last year - she appears euthyroid.  - she does not appear to have a goiter, thyroid nodules, or neck compression symptoms - We discussed about correct intake of levothyroxine, fasting, with water, separated by at least 30 minutes from breakfast, and separated by more than 4 hours from calcium, iron, multivitamins, acid reflux medications (PPIs). She is taking LT4 with coffee + creamer and also along with PPIs and MVIs >> rec'd to take these >4h later and drink her coffee at least 30 min after LT4 or eliminate her creamer - will check thyroid tests in 5 to 6 weeks after the above changes: TSH, free T4 - If labs today are abnormal, she will need to return in ~6 weeks for repeat labs - Otherwise, I will see her back in 6 months  2. Long term steroid use -Patient describes mental fog, weight gain, fatigue, stretch marks, and a buffalo hump.  We discussed that these are all expected after po steroid treatment, which, she used in the past, at high doses, but fortunately, she was able to stop.  She continues on inhaled steroids, and we discussed that the absorption of these is not negligible, but definitely less likely to cause Cushing's physical features compared to prednisone. -We discussed that her, since her stretch marks appeared at the time of her previous pregnancies, they are not related to excess cortisol.  She does not have new, purple, stretch marks. -She does have a buffalo hump, which could be related to her previous steroid treatment.   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  I do not have a suspicion for endogenous Cushing's syndrome, however, I did suggest to check a dexamethasone suppression test to investigate for this.  I advised her how to do the test.  If the test is negative, we could check a cortisol  and ACTH level, however, she does mention significant stress at work due to a job with high responsibility, and we discussed that this can elevate her cortisol levels.  Orders Placed This Encounter  Procedures  . Dexamethasone, blood  . Cortisol-am, blood   Addendum (09/11/2020):  Labs are still pending... I will addend the results when they become available.   Carlus Pavlov, MD PhD [facility]

## 2020-10-25 ENCOUNTER — Telehealth: Payer: Self-pay | Admitting: Pulmonary Disease

## 2020-10-25 DIAGNOSIS — J849 Interstitial pulmonary disease, unspecified: Secondary | ICD-10-CM

## 2020-10-25 NOTE — Telephone Encounter (Signed)
I called and spoke with pt and her cxr is scheduled for tomorrow and she has a pending appt with PM on 03/08 at 10:45.

## 2020-10-25 NOTE — Telephone Encounter (Addendum)
I called and discussed with patient.  Her symptoms are not typical of asthma exacerbation or infection She has been tested negative multiple times recently for Covid and flu She has had a work-up at [facility] including a cardiac evaluation which was negative  Please order a chest x-ray.  She will come in to get this done in the next few days Schedule an office visit with me at the next available.  Chilton Greathouse MD [facility]

## 2020-10-25 NOTE — Telephone Encounter (Signed)
I called and spoke with [name] and she stated that she has been having more chest pressure and SHOB than usual.  She stated that after about 3 pm during the day is when this gets worse and she has a hard time moving around.  She also stated that she feels tired all the time. She feels that talking too much increases these symptoms.  She was seen last by [facility] the end of Jan and she stated that she just cannot go there right now due to the distance.   Her PCP advised her to call her lung doctor.  She stated that she has been using the symbicort.  PM please advise. Thanks

## 2020-10-26 ENCOUNTER — Ambulatory Visit (INDEPENDENT_AMBULATORY_CARE_PROVIDER_SITE_OTHER): Payer: 59

## 2020-10-26 DIAGNOSIS — J849 Interstitial pulmonary disease, unspecified: Secondary | ICD-10-CM

## 2020-10-30 ENCOUNTER — Ambulatory Visit: Payer: 59 | Admitting: Pulmonary Disease

## 2020-10-30 ENCOUNTER — Encounter: Payer: Self-pay | Admitting: Pulmonary Disease

## 2020-10-30 ENCOUNTER — Other Ambulatory Visit: Payer: Self-pay

## 2020-10-30 VITALS — BP 126/78 | HR 94 | Temp 97.6°F | Ht 66.0 in | Wt 192.8 lb

## 2020-10-30 DIAGNOSIS — J849 Interstitial pulmonary disease, unspecified: Secondary | ICD-10-CM

## 2020-10-30 NOTE — Patient Instructions (Signed)
Schedule high-resolution CT for better evaluation of the lungs  For your allergy symptoms you can take over-the-counter chlorpheniramine [can cause excessive drowsiness].  Alternatively you can try Allegra or Claritin which are less sedating  Follow-up in 3 months.

## 2020-10-30 NOTE — Addendum Note (Signed)
Addended by: Melonie Florida on: 10/30/2020 11:37 AM   Modules accepted: Orders

## 2020-10-30 NOTE — Progress Notes (Signed)
[name]    [id number]    Dec 07, 1975  Primary Care Physician:Skakle, Liane Comber, DO  Referring Physician: Sueanne Margarita, DO [place],  [place] [address]  Chief complaint: Follow-up for interstitial lung disease, hypersensitivity pneumonitis.  HPI: 45 year old with history of fibromyalgia, depression, anxiety Referred for evaluation of abnormal CT scan, ILD  Complains of increasing dyspnea with chest pressure since mid August 2020.  She had a CTA done by her primary care which did not show any embolism but showed bilateral diffuse pulmonary infiltrates.  COVID-19 test was negative.  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 Trudie Reed, rheumatology.  She had a work-up done including labs which were reportedly negative.  CTD serologies and other labs were negative as well.  She underwent bronchoscopy on 07/12/2019 with findings of lymphocytosis on BAL and granulomas. 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   She was restarted on prednisone post COVID due to worsening dyspnea and tried to taper again  At last visit on 10/26/19 prednisone was down to 5 mg but continued to have persistent symptoms with CT scan showing evolving interstitial lung disease.  Prednisone increased to 40 mg a day and surgical lung biopsy done on 12/12/19  Finished pulmonary rehab at the end of 2021  Pets: Has dogs.  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  She  has a down comforter for many years and got a down pillow in 2019. She got rid of the down after bronchoscopy in late 2020 Smoking history: No significant smoking history Travel history: Previously lived in [place], [place].  Has been living in [place] for the past 18 years.  No significant recent travel Relevant family history: Grandfather died of lung cancer.  No other significant family history of lung disease.  Interim history:  Seen back in clinic after gap of 9 months.  She is tapered off the prednisone now In the interim she has followed up with Dr. 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  She has enrolled in long-term Covid clinic at [facility] and underwent a cardiac MRI and had a xenon gas lung scan for research purposes  She had a follow-up at [facility] in January where she had PFTs which are stable However over the past 2 months she reports increasing dyspnea, chest tightness, fatigue.  Outpatient Encounter Medications as of 10/30/2020  Medication Sig  . acetaminophen (TYLENOL) 650 MG CR tablet Take 650 mg by mouth every 8 (eight) hours as needed for pain.  Marland Kitchen albuterol (VENTOLIN HFA) 108 (90 Base) MCG/ACT inhaler Inhale into the lungs.  . budesonide-formoterol (SYMBICORT) 160-4.5 MCG/ACT inhaler Inhale 2 puffs into the lungs 2 (two) times daily.  . busPIRone (BUSPAR) 10 MG tablet Take 10 mg by mouth 2 (two) times daily.  . DULoxetine (CYMBALTA) 60 MG capsule TAKE 1 CAPSULE BY MOUTH EVERY DAY (Patient taking differently: Take 60 mg by mouth daily.)  . ferrous sulfate 325 (  65 FE) MG tablet Take 325 mg by mouth daily with breakfast.  . levothyroxine (SYNTHROID) 125 MCG tablet Take 125 mcg by mouth daily before breakfast.  . metFORMIN (GLUCOPHAGE-XR) 500 MG 24 hr tablet Take 500 mg by mouth in the morning and at bedtime.  . multivitamin-iron-minerals-folic acid (CENTRUM) chewable tablet Chew 1 tablet by mouth daily.  . pantoprazole  (PROTONIX) 40 MG tablet Take 2 tablets (80 mg total) by mouth daily.  . pregabalin (LYRICA) 150 MG capsule Take 1 capsule (150 mg total) by mouth 2 (two) times daily. (Patient taking differently: Take 150 mg by mouth in the morning, at noon, and at bedtime.)  . dexamethasone (DECADRON) 1 MG tablet Take 1 tablet by mouth once at 11 pm, before coming for labs at 8 am the next morning (Patient not taking: Reported on 10/30/2020)  . [DISCONTINUED] SYMBICORT 80-4.5 MCG/ACT inhaler Inhale into the lungs. (Patient not taking: Reported on 10/30/2020)   No facility-administered encounter medications on file as of 10/30/2020.   Physical Exam: Blood pressure 126/78, pulse 94, temperature 97.6 F (36.4 C), temperature source Temporal, height _0  (1.676 m), weight 192 lb 12.8 oz (87.5 kg), SpO2 97 %. Gen:      No acute distress HEENT:  EOMI, sclera anicteric Neck:     No masses; no thyromegaly Lungs:    Clear to auscultation bilaterally; normal respiratory effort CV:         Regular rate and rhythm; no murmurs Abd:      + bowel sounds; soft, non-tender; no palpable masses, no distension Ext:    No edema; adequate peripheral perfusion Skin:      Warm and dry; no rash Neuro: alert and oriented x 3 Psych: normal mood and affect  Data Reviewed: Imaging: CTA 06/15/2019-no pulmonary embolism, mild diffuse interstitial prominence with bilateral groundglass and nodular airspace opacities.    High-res CT 07/11/2019-Bilateral fine nodularity with groundglass with no significant air trapping.  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  High-res CT 11/02/2019-subpleural reticulation with groundglass, traction bronchiectasis no basal gradient.  Indeterminate for UIP.  High-res CT 05/09/2020-stable interstitial lung disease I have reviewed the images personally.  PFTs: [facility] 11/04/2019 FVC 2.53 [68%], FEV1 2.26 [75%], F/F 89, SVC 1.75 [50%], DLCO 14.61 [65%] Moderate restriction, diffusion defect.  Labs: ANA  03/25/2019-negative,  Rheumatoid factor 03/25/2019- < 14 Repeat CTD serologies 07/18/2019-negative, hypersensitivity panel-negative  CBC 06/15/2019-WBC 5.87, eos 6.4%, absolute eosinophil count 903 Metabolic panel 00/92/3300-[id number] normal limits D-dimer 06/30/2019-0.41 BNP 06/30/2019-10.   Bronchoscopy 07/12/2019 WBC 510, lymphocytes 38%, eos 12%, Microbiology-negative Cytology -no malignancy Transbronchial biopsy-nonnecrotizing epithelioid granulomas SARS-CoV-2, viral PCR-negative  Surgical lung biopsy 12/12/2019- features suggestive of chronic hypersensitivity pneumonitis with heterogeneous fibrosis and superimposed subacute lung injury.  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  Surgical lung biopsy discussed at our conference and at with no abnormalities agree with diagnosis of chronic hypersensitivity pneumonitis, post Covid ILD  She has been stable for many months,  off steroids but now has increasing chest tightness, dyspnea Do not suspect cardiac issues as she had a stress test last year which was normal and a cardiac MRI recently at [facility] with no abnormalities  Repeat HRCT for evaluation.  Plan/Recommendations: - High-resolution CT  Marshell Garfinkel MD [facility] 10/30/2020, 11:09 AM  CC: Sueanne Margarita, DO

## 2020-11-02 ENCOUNTER — Ambulatory Visit (HOSPITAL_BASED_OUTPATIENT_CLINIC_OR_DEPARTMENT_OTHER)
Admission: RE | Admit: 2020-11-02 | Discharge: 2020-11-02 | Disposition: A | Discharge status: Home / Self Care | Payer: 59 | Source: Ambulatory Visit | Attending: Pulmonary Disease | Admitting: Pulmonary Disease

## 2020-11-02 ENCOUNTER — Other Ambulatory Visit (HOSPITAL_BASED_OUTPATIENT_CLINIC_OR_DEPARTMENT_OTHER): Payer: 59

## 2020-11-02 ENCOUNTER — Other Ambulatory Visit: Payer: Self-pay

## 2020-11-02 DIAGNOSIS — J849 Interstitial pulmonary disease, unspecified: Secondary | ICD-10-CM | POA: Diagnosis not present

## 2020-11-05 NOTE — Telephone Encounter (Signed)
Patient of yours, you saw her on 10/30/20. Please see email

## 2020-11-07 NOTE — Telephone Encounter (Signed)
Called and spoke with patient. She is aware that [name] has been trying to get in touch with Dr. Foy Guadalajara. She stated that she also sent a message to Dr. Foy Guadalajara to let her know that [name] is wanting to speak with her. I was able to get her scheduled for 11/13/20 at 130pm.

## 2020-11-07 NOTE — Telephone Encounter (Signed)
I called [facility] and left a message earlier this week and am awaiting a call back.  If I do not hear back in the next day I will try again.  Please offer 1:30 pm appointment with me next week March 22-24 to discuss CT scan and any questions

## 2020-11-07 NOTE — Telephone Encounter (Signed)
Good morning, Please see patient comment.  Thank you.

## 2020-11-13 ENCOUNTER — Other Ambulatory Visit: Payer: Self-pay

## 2020-11-13 ENCOUNTER — Ambulatory Visit: Payer: 59 | Admitting: Pulmonary Disease

## 2020-11-13 ENCOUNTER — Encounter: Payer: Self-pay | Admitting: Pulmonary Disease

## 2020-11-13 ENCOUNTER — Other Ambulatory Visit (HOSPITAL_COMMUNITY)
Admission: RE | Admit: 2020-11-13 | Discharge: 2020-11-13 | Disposition: A | Discharge status: Home / Self Care | Payer: 59 | Source: Ambulatory Visit | Attending: Pulmonary Disease | Admitting: Pulmonary Disease

## 2020-11-13 VITALS — BP 126/78 | HR 101 | Temp 98.6°F | Ht 66.0 in | Wt 191.8 lb

## 2020-11-13 DIAGNOSIS — Z01812 Encounter for preprocedural laboratory examination: Secondary | ICD-10-CM | POA: Insufficient documentation

## 2020-11-13 DIAGNOSIS — Z20822 Contact with and (suspected) exposure to covid-19: Secondary | ICD-10-CM | POA: Diagnosis not present

## 2020-11-13 DIAGNOSIS — J849 Interstitial pulmonary disease, unspecified: Secondary | ICD-10-CM

## 2020-11-13 LAB — SARS CORONAVIRUS 2 (TAT 6-24 HRS): SARS Coronavirus 2: NEGATIVE

## 2020-11-13 MED ORDER — SULFAMETHOXAZOLE-TRIMETHOPRIM 800-160 MG PO TABS: 1.0000 | ORAL_TABLET | ORAL | 5 refills | Status: DC

## 2020-11-13 MED ORDER — PREDNISONE 20 MG PO TABS: ORAL_TABLET | ORAL | 2 refills | Status: DC

## 2020-11-13 NOTE — Progress Notes (Signed)
[name]    [id number]    [birth year]  Primary Care Physician:Skakle, Liane Comber, DO  Referring Physician: Sueanne Margarita, DO [address],  [place] [id number]  Chief complaint: Follow-up for interstitial lung disease, hypersensitivity pneumonitis.  HPI: 45 year old with history of fibromyalgia, depression, anxiety Referred for evaluation of abnormal CT scan, ILD  Complains of increasing dyspnea with chest pressure since mid August 2020.  She had a CTA done by her primary care which did not show any embolism but showed bilateral diffuse pulmonary infiltrates.  COVID-19 test was negative.  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 Trudie Reed, rheumatology.  She had a work-up done including labs which were reportedly negative.  CTD serologies and other labs were negative as well.  She underwent bronchoscopy on 07/12/2019 with findings of lymphocytosis on BAL and granulomas. 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   She was restarted on prednisone post COVID due to worsening dyspnea and tried to taper again  At last visit on 10/26/19 prednisone was down to 5 mg but continued to have persistent symptoms with CT scan showing evolving interstitial lung disease.  Prednisone increased to 40 mg a day and surgical lung biopsy done on 12/12/19  Finished pulmonary rehab at the end of 2021  Pets: Has dogs.  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  She  has a down comforter for many years and got a down pillow in 2019. She got rid of the down after bronchoscopy in late 2020 Smoking history: No significant smoking history Travel history: Previously lived in [place], [place].  Has been living in [place] for the past 18 years.  No significant recent travel Relevant family history: Grandfather died of lung cancer.  No other significant family history of lung disease.  Interim history:  Seen back in clinic after gap of 9 months.  She is tapered off the prednisone now In the interim she has followed up with Dr. 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  She has enrolled in long-term Covid clinic at [facility] and underwent a cardiac MRI and had a xenon gas lung scan for research purposes  She had a follow-up at [facility] in January where she had PFTs which are stable However over the past 2 months she reports increasing dyspnea, chest tightness, fatigue.  Outpatient Encounter Medications as of 11/13/2020  Medication Sig  . acetaminophen (TYLENOL) 650 MG CR tablet Take 650 mg by mouth every 8 (eight) hours as needed for pain.  Marland Kitchen albuterol (VENTOLIN HFA) 108 (90 Base) MCG/ACT inhaler Inhale into the lungs.  . budesonide-formoterol (SYMBICORT) 160-4.5 MCG/ACT inhaler Inhale 2 puffs into the lungs 2 (two) times daily.  . busPIRone (BUSPAR) 10 MG tablet Take 10 mg by mouth 2 (two) times daily.  . DULoxetine (CYMBALTA) 60 MG capsule TAKE 1 CAPSULE BY MOUTH EVERY DAY (Patient taking differently: Take 60 mg by mouth daily.)  . ferrous sulfate 325 (  65 FE) MG tablet Take 325 mg by mouth daily with breakfast.  . levothyroxine (SYNTHROID) 125 MCG tablet Take 125 mcg by mouth daily before breakfast.  . metFORMIN (GLUCOPHAGE-XR) 500 MG 24 hr tablet Take 500 mg by mouth in the morning and at bedtime.  . multivitamin-iron-minerals-folic acid (CENTRUM) chewable tablet Chew 1 tablet by mouth daily.  . pantoprazole  (PROTONIX) 40 MG tablet Take 2 tablets (80 mg total) by mouth daily.  . pregabalin (LYRICA) 150 MG capsule Take 1 capsule (150 mg total) by mouth 2 (two) times daily. (Patient taking differently: Take 150 mg by mouth in the morning, at noon, and at bedtime.)  . [DISCONTINUED] dexamethasone (DECADRON) 1 MG tablet Take 1 tablet by mouth once at 11 pm, before coming for labs at 8 am the next morning   No facility-administered encounter medications on file as of 11/13/2020.   Physical Exam: Blood pressure 126/78, pulse 94, temperature 97.6 F (36.4 C), temperature source Temporal, height _0  (1.676 m), weight 192 lb 12.8 oz (87.5 kg), SpO2 97 %. Gen:      No acute distress HEENT:  EOMI, sclera anicteric Neck:     No masses; no thyromegaly Lungs:    Clear to auscultation bilaterally; normal respiratory effort CV:         Regular rate and rhythm; no murmurs Abd:      + bowel sounds; soft, non-tender; no palpable masses, no distension Ext:    No edema; adequate peripheral perfusion Skin:      Warm and dry; no rash Neuro: alert and oriented x 3 Psych: normal mood and affect  Data Reviewed: Imaging: CTA 06/15/2019-no pulmonary embolism, mild diffuse interstitial prominence with bilateral groundglass and nodular airspace opacities.    High-res CT 07/11/2019-Bilateral fine nodularity with groundglass with no significant air trapping.  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  High-res CT 11/02/2019-subpleural reticulation with groundglass, traction bronchiectasis no basal gradient.  Indeterminate for UIP.  High-res CT 05/09/2020-stable interstitial lung disease I have reviewed the images personally.  PFTs: [facility] 11/04/2019 FVC 2.53 [68%], FEV1 2.26 [75%], F/F 89, SVC 1.75 [50%], DLCO 14.61 [65%] Moderate restriction, diffusion defect.  Labs: ANA 03/25/2019-negative,  Rheumatoid factor 03/25/2019- < 14 Repeat CTD serologies 07/18/2019-negative, hypersensitivity panel-negative  CBC 06/15/2019-WBC  5.87, eos 6.4%, absolute eosinophil count 035 Metabolic panel [id number] normal limits D-dimer 06/30/2019-0.41 BNP 06/30/2019-10.   Bronchoscopy 07/12/2019 WBC 510, lymphocytes 38%, eos 12%, Microbiology-negative Cytology -no malignancy Transbronchial biopsy-nonnecrotizing epithelioid granulomas SARS-CoV-2, viral PCR-negative  Surgical lung biopsy 12/12/2019- features suggestive of chronic hypersensitivity pneumonitis with heterogeneous fibrosis and superimposed subacute lung injury.  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  Surgical lung biopsy discussed at our conference and at with no abnormalities agree with diagnosis of chronic hypersensitivity pneumonitis, post Covid ILD  She has been stable for many months, off steroids but now has increasing chest tightness, dyspnea. Repeat CT reviewed with worsening ILD, pulmonary fibrosis with groundglass  I have  discussed the case with Dr. Mont Dutton at [facility].  Given presence of groundglass may be steroid responsive. We will check baseline 6-minute walk and PFTs, give a steroid trial at 40 mg/day for 1 to 2 months and repeat test. If there is improvement then consider continued immunosuppression with a steroid sparing agent If no  improvement then she likely has progressive fibrosing phenotype and consider antifibrotics  I had a detailed discussion with patient and husband in clinic today Given elevated sugars with steroids in the past she will check in with her primary care to monitor closely  Plan/Recommendations: - PFTs, 6-minute walk at baseline - Prednisone 40 mg a day, Bactrim prophylaxis  Marshell Garfinkel MD [facility] 11/13/2020, 2:04 PM  CC: Sueanne Margarita, DO

## 2020-11-13 NOTE — Patient Instructions (Signed)
I discussed the case with Dr. Foy Guadalajara at [facility]  Reviewed the CT together and feels that we need to give a trial of steroids to see if improved lung function and symptoms We will get PFTs later this week and start prednisone 40 mg a day Start Bactrim double strength 3 times a week  Check 6-minute walk test We will repeat these tests in 6-8 weeks and reassess

## 2020-11-13 NOTE — Addendum Note (Signed)
Addended by: Jacquiline Doe on: 11/13/2020 02:43 PM   Modules accepted: Orders

## 2020-11-16 ENCOUNTER — Other Ambulatory Visit: Payer: Self-pay

## 2020-11-16 ENCOUNTER — Ambulatory Visit (INDEPENDENT_AMBULATORY_CARE_PROVIDER_SITE_OTHER): Payer: 59 | Admitting: Pulmonary Disease

## 2020-11-16 DIAGNOSIS — J849 Interstitial pulmonary disease, unspecified: Secondary | ICD-10-CM

## 2020-11-16 LAB — PULMONARY FUNCTION TEST
DL/VA % pred: 112 %
DL/VA: 4.86 ml/min/mmHg/L
DLCO cor % pred: 69 %
DLCO cor: 16.02 ml/min/mmHg
DLCO unc % pred: 69 %
DLCO unc: 16.02 ml/min/mmHg
FEF 25-75 Post: 3.49 L/sec
FEF 25-75 Pre: 3.56 L/sec
FEF2575-%Change-Post: -2 %
FEF2575-%Pred-Post: 113 %
FEF2575-%Pred-Pre: 116 %
FEV1-%Change-Post: 0 %
FEV1-%Pred-Post: 62 %
FEV1-%Pred-Pre: 62 %
FEV1-Post: 1.95 L
FEV1-Pre: 1.95 L
FEV1FVC-%Change-Post: 0 %
FEV1FVC-%Pred-Pre: 111 %
FEV6-%Change-Post: 0 %
FEV6-%Pred-Post: 56 %
FEV6-%Pred-Pre: 56 %
FEV6-Post: 2.14 L
FEV6-Pre: 2.15 L
FEV6FVC-%Pred-Post: 102 %
FEV6FVC-%Pred-Pre: 102 %
FVC-%Change-Post: 0 %
FVC-%Pred-Post: 55 %
FVC-%Pred-Pre: 55 %
FVC-Post: 2.14 L
FVC-Pre: 2.15 L
Post FEV1/FVC ratio: 91 %
Post FEV6/FVC ratio: 100 %
Pre FEV1/FVC ratio: 90 %
Pre FEV6/FVC Ratio: 100 %
RV % pred: -1 %
RV: -0.02 L
TLC % pred: 41 %
TLC: 2.24 L

## 2020-11-16 NOTE — Progress Notes (Signed)
Full PFT performed today. N2 performed due to extreme amount of cough patient unable to complete pleth.

## 2020-11-16 NOTE — Patient Instructions (Signed)
Pulmonary function test performed today. 

## 2020-11-19 NOTE — Telephone Encounter (Signed)
[name],  This message was received this morning.  Dr Isaiah Serge- I completed the PFT test this past Friday afternoon. However, I am very concerned as I struggled so very bad with this test. This had to be the worst experience for me with performing this test, nonstop coughing, very hard to "pass" the tests- had to repeat several (I believe she had to have me redo #4 of them).  I got very emotional, as it was also very painful for me.  I'd like to know, from this test, what we are looking at, as far as lung capacity now- vs. January's test. I can tell you this, this test was more in depth, than what I did at [facility].  I spent the majority of this past weekend resting & napping unfortunately, due to this test.  I began the steroids on Saturday as instructed & continue my inhalers.  Thank you in advance for your help & please keep me posted.  Vance Gather L. [name] [phone]

## 2020-11-19 NOTE — Telephone Encounter (Signed)
PFTs show slight reduction in lung capacity compared to last year that were done here.   I did get the Jan results from [facility] and will be able to compare this test with those when I am back in office on Friday.

## 2020-11-26 NOTE — Telephone Encounter (Signed)
   Still pending Dr Shirlee More review of [facility] PFT. Unfortunately, I don't believe using the one done in January 2022 would be a correct comparison. These tests were different in their content, not how I felt but rather the one done at [facility] most recently had additional tests than what [facility] performed in January.   Your advice?  Thx- Lurene Shadow

## 2020-11-27 ENCOUNTER — Ambulatory Visit (INDEPENDENT_AMBULATORY_CARE_PROVIDER_SITE_OTHER): Payer: 59

## 2020-11-27 ENCOUNTER — Other Ambulatory Visit: Payer: Self-pay

## 2020-11-27 DIAGNOSIS — J849 Interstitial pulmonary disease, unspecified: Secondary | ICD-10-CM | POA: Diagnosis not present

## 2020-11-27 NOTE — Progress Notes (Signed)
Six Minute Walk - 11/27/20 0951      Six Minute Walk   Medications taken before test (dose and time) Symbicort 160 at 7:30, Buspar 10mg , Metformin 500mg , Lyrica 150mg  at 8:45    Supplemental oxygen during test? No    Lap distance in meters  34 meters    Laps Completed 13    Partial lap (in meters) 0 meters    Baseline BP (sitting) 118/70    Baseline Heartrate 94    Baseline Dyspnea (Borg Scale) 3.5    Baseline Fatigue (Borg Scale) 4    Baseline SPO2 98 %      End of Test Values    BP (sitting) 130/82    Heartrate 115    Dyspnea (Borg Scale) 5.5    Fatigue (Borg Scale) 5.5    SPO2 95 %      2 Minutes Post Walk Values   BP (sitting) 120/78    Heartrate 90    SPO2 98 %    Stopped or paused before six minutes? No    Other Symptoms at end of exercise: --   Chest tightness and calf pain which are not new symptoms     Interpretation   Distance completed 442 meters    Tech Comments: Walked at an average pace, coughed occasionally throughout the walk, completed entire without stopping, did not desaturate

## 2020-11-27 NOTE — Telephone Encounter (Signed)
We did full PFTs which gives additional information while the ones at [facility] were limited PFTs  You are right that we cannot compare PFTs done at other labs due to differences in technique and equipment etc. I reviewed the PFTs done at [facility] which were broadly similar in the measurements to the ones done here.

## 2020-11-30 ENCOUNTER — Telehealth: Payer: Self-pay | Admitting: Pulmonary Disease

## 2020-11-30 DIAGNOSIS — J849 Interstitial pulmonary disease, unspecified: Secondary | ICD-10-CM

## 2020-11-30 NOTE — Telephone Encounter (Signed)
Per [name]-  Patient needs repeat 6 min walk test and PFT in early to mid June. [name] request same CMA/Nurse to repeat walk and PFT. 6 min walk was completed by Irving Burton, CMA. PFT was completed with Lamar Laundry, CMA. Will call and schedule Patient next week.

## 2020-12-04 NOTE — Telephone Encounter (Signed)
Currently 6 minute walk schedule is not available for June 2022. Will schedule Patient once available.  Patient is currently scheduled PFT 01/09/21, with [facility]. Will follow up with [name] to make sure he is aware Patient has scheduled PFT in May.

## 2020-12-19 NOTE — Telephone Encounter (Signed)
Called and spoke with Patient. Patient is scheduled 6 min walk 12/27/20, PFT 01/09/21, and follow up OV with [name] 02/05/21. Patient wanted to know if she needed to continue Prednisone until OV, or if [name] wanted her to change dose? Patient also would like to know if she needs chest CT before OV?  Message routed to [name] to advise

## 2020-12-21 NOTE — Telephone Encounter (Signed)
Continue prednisone at least until these test can be repeated She will need a follow-up high-resolution CT as well.  Please order for mid May

## 2020-12-21 NOTE — Telephone Encounter (Signed)
I have called and LM on VM to make the pt aware of PM recs.  I have placed the order for the HRCT for mid may.  Advised the pt in the VM that our office would call and let her know once this has been scheduled.

## 2020-12-27 ENCOUNTER — Ambulatory Visit (INDEPENDENT_AMBULATORY_CARE_PROVIDER_SITE_OTHER): Payer: 59

## 2020-12-27 ENCOUNTER — Other Ambulatory Visit: Payer: Self-pay

## 2020-12-27 DIAGNOSIS — J849 Interstitial pulmonary disease, unspecified: Secondary | ICD-10-CM

## 2020-12-27 NOTE — Progress Notes (Signed)
Six Minute Walk - 12/27/20 0950      Six Minute Walk   Medications taken before test (dose and time) Symbicort 160 and levothyroxine at 7:00am    Supplemental oxygen during test? No    Lap distance in meters  34 meters    Laps Completed 10    Partial lap (in meters) 23 meters    Baseline BP (sitting) 120/80    Baseline Heartrate 101    Baseline Dyspnea (Borg Scale) 5    Baseline Fatigue (Borg Scale) 3    Baseline SPO2 96 %      End of Test Values    BP (sitting) 140/92    Heartrate 114    Dyspnea (Borg Scale) 5    Fatigue (Borg Scale) 4    SPO2 94 %      2 Minutes Post Walk Values   BP (sitting) 124/84    Heartrate 103    SPO2 96 %    Stopped or paused before six minutes? No      Interpretation   Distance completed 363 meters    Tech Comments: Pt walked at an average pace coughing throughout the walk. Pt completed entire walk without stopping and did not desat.

## 2021-01-04 ENCOUNTER — Other Ambulatory Visit: Payer: Self-pay | Admitting: *Deleted

## 2021-01-04 DIAGNOSIS — J849 Interstitial pulmonary disease, unspecified: Secondary | ICD-10-CM

## 2021-01-07 ENCOUNTER — Other Ambulatory Visit (HOSPITAL_COMMUNITY): Payer: 59

## 2021-01-08 ENCOUNTER — Other Ambulatory Visit: Payer: Self-pay

## 2021-01-08 ENCOUNTER — Ambulatory Visit (HOSPITAL_BASED_OUTPATIENT_CLINIC_OR_DEPARTMENT_OTHER)
Admission: RE | Admit: 2021-01-08 | Discharge: 2021-01-08 | Disposition: A | Discharge status: Home / Self Care | Payer: 59 | Source: Ambulatory Visit | Attending: Pulmonary Disease | Admitting: Pulmonary Disease

## 2021-01-08 DIAGNOSIS — J849 Interstitial pulmonary disease, unspecified: Secondary | ICD-10-CM | POA: Diagnosis present

## 2021-01-11 NOTE — Progress Notes (Signed)
You are seeing her in June. I sent her a mychart result message

## 2021-01-14 ENCOUNTER — Other Ambulatory Visit (HOSPITAL_COMMUNITY)
Admission: RE | Admit: 2021-01-14 | Discharge: 2021-01-14 | Disposition: A | Discharge status: Home / Self Care | Payer: 59 | Source: Ambulatory Visit | Attending: Pulmonary Disease | Admitting: Pulmonary Disease

## 2021-01-14 ENCOUNTER — Other Ambulatory Visit (HOSPITAL_COMMUNITY): Payer: 59

## 2021-01-14 DIAGNOSIS — Z20822 Contact with and (suspected) exposure to covid-19: Secondary | ICD-10-CM | POA: Insufficient documentation

## 2021-01-14 DIAGNOSIS — Z01812 Encounter for preprocedural laboratory examination: Secondary | ICD-10-CM | POA: Diagnosis present

## 2021-01-14 LAB — SARS CORONAVIRUS 2 (TAT 6-24 HRS): SARS Coronavirus 2: NEGATIVE

## 2021-01-17 ENCOUNTER — Ambulatory Visit (INDEPENDENT_AMBULATORY_CARE_PROVIDER_SITE_OTHER): Payer: 59 | Admitting: Pulmonary Disease

## 2021-01-17 ENCOUNTER — Other Ambulatory Visit: Payer: Self-pay

## 2021-01-17 DIAGNOSIS — J849 Interstitial pulmonary disease, unspecified: Secondary | ICD-10-CM

## 2021-01-17 LAB — PULMONARY FUNCTION TEST
DL/VA % pred: 103 %
DL/VA: 4.44 ml/min/mmHg/L
DLCO cor % pred: 63 %
DLCO cor: 14.6 ml/min/mmHg
DLCO unc % pred: 63 %
DLCO unc: 14.6 ml/min/mmHg
FEF 25-75 Post: 3.9 L/sec
FEF 25-75 Pre: 3.53 L/sec
FEF2575-%Change-Post: 10 %
FEF2575-%Pred-Post: 127 %
FEF2575-%Pred-Pre: 115 %
FEV1-%Change-Post: 2 %
FEV1-%Pred-Post: 68 %
FEV1-%Pred-Pre: 66 %
FEV1-Post: 2.12 L
FEV1-Pre: 2.06 L
FEV1FVC-%Change-Post: 0 %
FEV1FVC-%Pred-Pre: 109 %
FEV6-%Change-Post: 2 %
FEV6-%Pred-Post: 62 %
FEV6-%Pred-Pre: 61 %
FEV6-Post: 2.38 L
FEV6-Pre: 2.31 L
FEV6FVC-%Pred-Post: 102 %
FEV6FVC-%Pred-Pre: 102 %
FVC-%Change-Post: 2 %
FVC-%Pred-Post: 61 %
FVC-%Pred-Pre: 59 %
FVC-Post: 2.38 L
FVC-Pre: 2.31 L
Post FEV1/FVC ratio: 89 %
Post FEV6/FVC ratio: 100 %
Pre FEV1/FVC ratio: 89 %
Pre FEV6/FVC Ratio: 100 %
RV % pred: 80 %
RV: 1.43 L
TLC % pred: 72 %
TLC: 3.86 L

## 2021-01-17 NOTE — Progress Notes (Signed)
PFT done today. 

## 2021-02-05 ENCOUNTER — Other Ambulatory Visit: Payer: Self-pay

## 2021-02-05 ENCOUNTER — Encounter: Payer: Self-pay | Admitting: Pulmonary Disease

## 2021-02-05 ENCOUNTER — Ambulatory Visit (INDEPENDENT_AMBULATORY_CARE_PROVIDER_SITE_OTHER): Payer: 59 | Admitting: Pulmonary Disease

## 2021-02-05 VITALS — BP 130/80 | HR 109 | Temp 97.8°F | Ht 65.0 in | Wt 185.0 lb

## 2021-02-05 DIAGNOSIS — J849 Interstitial pulmonary disease, unspecified: Secondary | ICD-10-CM | POA: Diagnosis not present

## 2021-02-05 MED ORDER — TRELEGY ELLIPTA 200-62.5-25 MCG/INH IN AEPB: 1.0000 | INHALATION_SPRAY | Freq: Every day | RESPIRATORY_TRACT | 0 refills | Status: DC

## 2021-02-05 NOTE — Progress Notes (Signed)
[name]    [id number]    04-14-1976  Primary Care Physician:Carter, Crystal Comber DO  Referring Physician: Sueanne Carter, [place],  [place] [address]  Chief complaint: Follow-up for interstitial lung disease, hypersensitivity pneumonitis.  HPI: 45 year old with history of fibromyalgia, depression, anxiety Referred for evaluation of abnormal CT scan, ILD  Complains of increasing dyspnea with chest pressure since mid August 2020.  She had a CTA done by her primary care which did not show any embolism but showed bilateral diffuse pulmonary infiltrates.  COVID-19 test was negative.  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  She had a work-up done including labs which were reportedly negative.  CTD serologies and other labs were negative as well.  She underwent bronchoscopy on 07/12/2019 with findings of lymphocytosis on BAL and granulomas. 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   She was restarted on prednisone post COVID due to worsening dyspnea and tried to taper again  At last visit on 10/26/19 prednisone was down to 5 mg but continued to have persistent symptoms with CT scan showing evolving interstitial lung disease.  Prednisone increased to 40 mg a day and surgical lung biopsy done on 12/12/19  Finished pulmonary rehab at the end of 2021 which did not help with symptoms  Seen back in clinic in March 2022 after gap of 9 months.  She has been tapered off prednisone next In the interim she has followed  up with Dr. 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  She has enrolled in long-term Covid clinic at [facility] and underwent a cardiac MRI and had a xenon gas lung scan for research purposes  Pets: Has dogs.  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  She has a down comforter for many years and got a down pillow in 2019. She got rid of the down after bronchoscopy in late 2020 Smoking history: No significant smoking history Travel history: Previously lived in [place], [place].  Has been living in [place] for the past 18 years.  No significant recent travel Relevant family history: Grandfather died of lung cancer.  No other significant family history of lung disease.  Interim history:  Evaluated earlier this year for worsening dyspnea, chest tightness.  CT scan did show some progression of pulm fibrosis Plan discussed over telephone with her pulmonologist at [facility] who suggested a trial of prednisone 40 mg a day for 1 month to see if there is any improvement  She has completed her trial of prednisone and is here to review follow-up CT, PFTs and 6-minute Overall she feels that the prednisone has not helped.  She has had weight gain with the prednisone and elevated heart to control blood sugars  Outpatient Encounter Medications as of 02/05/2021  Medication Sig   acetaminophen (TYLENOL) 650 MG CR tablet Take 650 mg by mouth every 8 (eight) hours as needed for pain.   albuterol (VENTOLIN  HFA) 108 (90 Base) MCG/ACT inhaler Inhale into the lungs.   budesonide-formoterol (SYMBICORT) 160-4.5 MCG/ACT inhaler Inhale 2 puffs into the lungs 2 (two) times daily.   busPIRone (BUSPAR) 10 MG tablet Take 10 mg by mouth 2 (two) times daily.   doxycycline (DORYX) 100 MG EC tablet Take 100 mg by mouth daily.   DULoxetine (CYMBALTA) 60 MG capsule TAKE 1 CAPSULE BY  MOUTH EVERY DAY (Patient taking differently: Take 60 mg by mouth daily.)   ferrous sulfate 325 (65 FE) MG tablet Take 325 mg by mouth daily with breakfast.   levothyroxine (SYNTHROID) 125 MCG tablet Take 125 mcg by mouth daily before breakfast.   metFORMIN (GLUCOPHAGE-XR) 500 MG 24 hr tablet Take 500 mg by mouth 3 (three) times daily. 1534m total daily   multivitamin-iron-minerals-folic acid (CENTRUM) chewable tablet Chew 1 tablet by mouth daily.   pantoprazole (PROTONIX) 40 MG tablet Take 2 tablets (80 mg total) by mouth daily.   predniSONE (DELTASONE) 20 MG tablet 40 mg daily (Patient taking differently: Take 20 mg by mouth daily with breakfast.)   pregabalin (LYRICA) 150 MG capsule Take 1 capsule (150 mg total) by mouth 2 (two) times daily. (Patient taking differently: Take 150 mg by mouth in the morning, at noon, and at bedtime.)   sulfamethoxazole-trimethoprim (BACTRIM DS) 800-160 MG tablet Take 1 tablet by mouth 3 (three) times a week.   No facility-administered encounter medications on file as of 02/05/2021.   Physical Exam: Blood pressure 130/80, pulse (!) 109, temperature 97.8 F (36.6 C), temperature source Temporal, height _0  (1.651 m), weight 185 lb (83.9 kg), SpO2 99 %. Gen:      No acute distress HEENT:  EOMI, sclera anicteric Neck:     No masses; no thyromegaly Lungs:    Clear to auscultation bilaterally; normal respiratory effort CV:         Regular rate and rhythm; no murmurs Abd:      + bowel sounds; soft, non-tender; no palpable masses, no distension Ext:    No edema; adequate peripheral perfusion Skin:      Warm and dry; no rash Neuro: alert and oriented x 3 Psych: normal mood and affect   Data Reviewed: Imaging: CTA 06/15/2019-no pulmonary embolism, mild diffuse interstitial prominence with bilateral groundglass and nodular airspace opacities.    High-res CT 07/11/2019-Bilateral fine nodularity with groundglass with no significant air trapping.  Alternate  diagnosis to UIP.  High-res CT 11/02/2019-subpleural reticulation with groundglass, traction bronchiectasis no basal gradient.  Indeterminate for UIP.  High-res CT 05/09/2020-stable interstitial lung disease  High-res CT 11/02/2020-impression of fibrotic lung disease with groundglass opacities  High-res CT 01/08/2021-stable ILD compared to March 2022  PFTs: [place] [place]/07/2020 FVC 2.53 [68%], FEV1 2.26 [75%], F/F 89, SVC 1.75 [50%], DLCO 14.61 [65%] Moderate restriction, diffusion defect.  11/16/2020 FVC 2.14 [5%), FEV1 1.95 (62%], F/F 91, TLC 2.24 (41%), DLCO 16.02 (69%] Severe restriction, mild diffusion defect.  01/17/2021 FVC 2.38 (61%), FEV1 2.12 (68%), F/F 89, TLC 3.86 [72%), DLCO 14.60 (63%) Severe restriction, mild diffusion defect  6-minute walk 12/27/2020- 363m  Labs: ANA 03/25/2019-negative,  Rheumatoid factor 03/25/2019- < 14 Repeat CTD serologies 07/18/2019-negative, hypersensitivity panel-negative  CBC 06/15/2019-WBC 5.87, eos 6.4%, absolute eosinophil count 3809Metabolic panel 198/33/8250-NLZJQBnormal limits D-dimer 06/30/2019-0.41 BNP 06/30/2019-10.   Bronchoscopy 07/12/2019 WBC 510, lymphocytes 38%, eos 12%, Microbiology-negative Cytology -no malignancy Transbronchial biopsy-nonnecrotizing epithelioid granulomas SARS-CoV-2, viral PCR-negative  Surgical lung biopsy 12/12/2019- features suggestive of chronic hypersensitivity pneumonitis with heterogeneous fibrosis  and superimposed subacute lung injury.  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  Surgical lung biopsy discussed at our conference and at with no abnormalities agree with diagnosis of chronic hypersensitivity pneumonitis, post Covid ILD  She has been stable for many months, off steroids but now has increasing chest tightness, dyspnea. Repeat CT reviewed with worsening ILD, pulmonary fibrosis with groundglass  I have discussed the case with [name] at [facility].  Given presence of groundglass may be steroid responsive.  However she has not responded to 40 mg of prednisone for 1.5 months trial with unchanged CT, PFTs and 6-minute walk  We will start tapering prednisone as its not helping and is causing side effects She may have progressive fibrosing phenotype and need to consider antifibrotics but we decided not tpull that gun yet. I will discuss at multidisciplinary conference and monitor with repeat CT in 6  Plan/Recommendations: - [facility]DC evaluation - Repeat CT in 6  Crystal Garfinkel MD [facility] 02/05/2021, 9:08 AM  CC: Crystal Margarita, DO

## 2021-02-05 NOTE — Patient Instructions (Signed)
I have reviewed the CT scan and PFTs which looks stable As we discussed Taper prednisone to 10 mg for a week then 5 mg for a week and then stop. He can stop the Bactrim once after prednisone  We will schedule high-res CT in 6 months and follow-up in clinic after

## 2021-02-08 ENCOUNTER — Other Ambulatory Visit: Payer: Self-pay | Admitting: Pulmonary Disease

## 2021-02-14 MED ORDER — TRELEGY ELLIPTA 200-62.5-25 MCG/INH IN AEPB: 1.0000 | INHALATION_SPRAY | Freq: Every day | RESPIRATORY_TRACT | 5 refills | Status: DC

## 2021-03-05 ENCOUNTER — Ambulatory Visit: Payer: Self-pay | Admitting: Pulmonary Disease

## 2021-03-05 DIAGNOSIS — J849 Interstitial pulmonary disease, unspecified: Secondary | ICD-10-CM | POA: Diagnosis not present

## 2021-03-19 NOTE — Progress Notes (Signed)
   Interstitial Lung Disease Multidisciplinary Conference   [name]    MRN [id number]    DOB Feb 24, 1976  Primary Care Physician:Skakle, Eliberto Ivory, DO  Referring Physician: Chilton Greathouse MD  Time of Conference: 7.30am- 8.30am Date of conference: 03/05/2021 Location of Conference: -  Virtual  Participating Pulmonary: Dr Chilton Greathouse, MD Pathology: Dr Holley Bouche, MD Radiology: Dr Trudie Reed MD Others:   Brief History:  She had initial diagnosis of hypersensitivity pneumonitis with moderate confidence based on bronchoscopy which showed lymphocytes, granulomas on transbronchial biopsies and exposure to down, discussed at ILD conference in December 2020.  Initially improved on prednisone but symptoms worsened after COVID-19 infection in January 2021.  Surgical lung biopsy discussed at our conference and at with no abnormalities agree with diagnosis of chronic hypersensitivity pneumonitis, post Covid ILD  Now with worsening symptoms and CT scan. Request to reexamine her imaging and pathology.  Does she have a progressive fibrotic phenotype?  Serology:   MDD discussion of CT scan   HRCT: 01/08/2021 HRCT: 11/02/2020 HRCT: 05/09/2020 HRCT: 01/27/2020 HRCT: 11/02/2019 HRCT: 07/01/2019    Pathology discussion of biopsy: Discussion of biopsy similar to prior MDC conclusions of Upper lobe biopsy shows chronic airway centric fibrosis Mid lung biopsy shows evidence of organizing pneumonia pattern with subacute injury There is heterogeneous fibrosis in the lower lobes with no evidence of honeycombing  PFTs:  01/17/2021 Severe restriction, mild diffusion defect   MDD Impression/Recs:  Review of CT scan from 2021 to 2022 Although initial read on recent CT scans is low progression and worsening of groundglass opacity on review at multidisciplinary conference it was felt that the change in groundglass opacities was minimal.  It was felt that CT scans have been stable without  significant change   Time Spent in preparation and discussion:  > 30 min    SIGNATURE   Chilton Greathouse MD  Pulmonary & Critical care 03/19/2021, 5:12 PM

## 2021-06-13 ENCOUNTER — Telehealth: Payer: Self-pay | Admitting: Pulmonary Disease

## 2021-06-20 NOTE — Telephone Encounter (Signed)
Left detailed message for the patient to let them know we will call in November to schedule

## 2021-06-28 ENCOUNTER — Encounter: Payer: Self-pay | Admitting: Primary Care

## 2021-06-28 ENCOUNTER — Telehealth: Payer: Self-pay | Admitting: Primary Care

## 2021-06-28 ENCOUNTER — Other Ambulatory Visit: Payer: Self-pay

## 2021-06-28 ENCOUNTER — Ambulatory Visit: Payer: 59 | Admitting: Primary Care

## 2021-06-28 VITALS — BP 116/80 | HR 117 | Temp 97.9°F | Ht 65.5 in | Wt 190.0 lb

## 2021-06-28 DIAGNOSIS — J849 Interstitial pulmonary disease, unspecified: Secondary | ICD-10-CM | POA: Diagnosis not present

## 2021-06-28 DIAGNOSIS — E611 Iron deficiency: Secondary | ICD-10-CM | POA: Diagnosis not present

## 2021-06-28 DIAGNOSIS — R066 Hiccough: Secondary | ICD-10-CM | POA: Diagnosis not present

## 2021-06-28 MED ORDER — IPRATROPIUM-ALBUTEROL 0.5-2.5 (3) MG/3ML IN SOLN: 3.0000 mL | Freq: Four times a day (QID) | RESPIRATORY_TRACT | 0 refills | Status: DC | PRN

## 2021-06-28 MED ORDER — BREZTRI AEROSPHERE 160-9-4.8 MCG/ACT IN AERO: 2.0000 | INHALATION_SPRAY | Freq: Two times a day (BID) | RESPIRATORY_TRACT | 0 refills | Status: DC

## 2021-06-28 MED ORDER — PANTOPRAZOLE SODIUM 40 MG PO TBEC: 40.0000 mg | DELAYED_RELEASE_TABLET | Freq: Every day | ORAL | 3 refills | Status: DC

## 2021-06-28 NOTE — Assessment & Plan Note (Addendum)
-   Patient is taking ferrous sulfate 365 mg (65 fe) daily - Checking CBC and iron panel

## 2021-06-28 NOTE — Telephone Encounter (Signed)
Can we please get HRCT ordered by [name] for December moved up to first available. Thanks

## 2021-06-28 NOTE — Progress Notes (Signed)
_0  ID: [name], female    DOB: 05-01-1976, 45 y.o.   MRN: [id number]  Chief Complaint  Patient presents with   Follow-up    Pt states chronic SOB and chest tightness since May     Referring provider: Sueanne Margarita, DO  HPI: 45 year old with history of fibromyalgia, depression, anxiety Referred for evaluation of abnormal CT scan, ILD   Complains of increasing dyspnea with chest pressure since mid August 2020.  She had a CTA done by her primary care which did not show any embolism but showed bilateral diffuse pulmonary infiltrates.  COVID-19 test was negative.  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 Trudie Reed, rheumatology.  She had a work-up done including labs which were reportedly negative.  CTD serologies and other labs were negative as well.   She underwent bronchoscopy on 07/12/2019 with findings of lymphocytosis on BAL and granulomas. 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    She was restarted on prednisone post COVID due to worsening dyspnea and tried to taper again  At last visit on 10/26/19 prednisone was down to 5 mg but continued to have persistent symptoms with CT scan showing evolving interstitial lung disease.  Prednisone increased to 40 mg a day and surgical lung biopsy done on 12/12/19   Finished pulmonary rehab at the end of 2021 which did not help with symptoms  Seen back in clinic in March 2022 after gap of 9 months.  She has been tapered off prednisone next In the interim she has followed up with Dr. 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  She has enrolled in long-term Covid clinic at [facility] and underwent a cardiac MRI and had a xenon gas lung scan for research purposes   Pets: Has dogs.  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  She has a down comforter for many years and got a down pillow in 2019. She got rid of the down after bronchoscopy in late 2020 Smoking history: No significant smoking history Travel history: Previously lived in [place], [place].  Has been living in [place] for the past 18 years.  No significant recent travel Relevant family history: Grandfather died of lung cancer.  No other significant family history of lung disease.   Interim history:  Evaluated earlier this year for worsening dyspnea, chest tightness.  CT scan did show some progression of pulm fibrosis Plan discussed over telephone with her pulmonologist at [facility] who suggested a trial of prednisone 40 mg a day for 1 month to see if there is any improvement  She has completed her trial of prednisone and is here to review follow-up CT, PFTs and 6-minute Overall she feels that the prednisone has not helped.  She has had weight gain with the prednisone and elevated heart to control blood sugars   06/28/2021- Interim hx  Patient presents today for acute visit. She was last seen in June by Dr. [place] Browner and told to follow-up in December  after having CT chest. During her last visit shw was given sample of Trelegy 265mg and she was taper off prednisone as it was not helping and causing side effects.  Her breathing is worse, she is struggling to get through the day. She is taking Trelegy 2085m daily. She reports having chest tightness, coughing spells, body aches, hiccups, inability to exercise. Feels like she is worsening.    Allergies  Allergen Reactions   Methimazole Other (See  Comments)    Body goes into arthritic shock   Bupropion     Body aches / headaches     Immunization History  Administered Date(s) Administered   Influenza Split 05/19/2012   Influenza Whole 06/19/2008   Influenza, Quadrivalent, Recombinant, Inj, Pf 05/15/2020   Influenza,inj,Quad PF,6+ Mos 04/30/2015, 04/12/2016, 06/23/2018, 05/25/2019   Influenza-Unspecified 04/27/2015, 04/30/2015, 04/25/2016   PFIZER(Purple Top)SARS-COV-2 Vaccination 01/26/2020, 03/08/2020   Td 07/13/2009    Past Medical History:  Diagnosis Date   Anxiety    Arthritis    Depression    Diabetes mellitus without complication (HCC)    Dyspnea    Fibromyalgia    GERD (gastroesophageal reflux disease)    Headache    migraines   Hypothyroidism 1999   post PTU Rx for hyperthyroidism   IBS (irritable bowel syndrome)    Interstitial lung disease (HCCarrizo   Pneumonia 06/2019   Double PNA    Tobacco History: Social History   Tobacco Use  Smoking Status Never  Smokeless Tobacco Never   Counseling given: Not Answered   Outpatient Medications Prior to Visit  Medication Sig Dispense Refill   acetaminophen (TYLENOL) 650 MG CR tablet Take 650 mg by mouth every 8 (eight) hours as needed for pain.     albuterol (VENTOLIN HFA) 108 (90 Base) MCG/ACT inhaler Inhale into the lungs.     budesonide-formoterol (SYMBICORT) 160-4.5 MCG/ACT inhaler Inhale 2 puffs into the lungs 2 (two) times daily.     busPIRone (BUSPAR) 10 MG tablet Take 10 mg by mouth 2 (two) times daily.     doxycycline (DORYX) 100 MG EC tablet Take 100 mg by mouth daily.     DULoxetine (CYMBALTA) 60 MG capsule TAKE 1 CAPSULE BY MOUTH EVERY DAY (Patient taking differently: Take 60 mg by mouth daily.) 90 capsule 2   ferrous sulfate 324 MG TBEC Take 324 mg by mouth.     ferrous sulfate 325 (65 FE) MG tablet Take 325 mg by mouth daily with breakfast.     Fluticasone-Umeclidin-Vilant (TRELEGY ELLIPTA) 200-62.5-25 MCG/INH AEPB Inhale 1 puff into the lungs  daily. 60 each 5   Iron, Ferrous Sulfate, 325 (65 Fe) MG TABS Take 1 tablet by mouth daily.     levothyroxine (SYNTHROID) 125 MCG tablet Take 125 mcg by mouth daily before breakfast.     metFORMIN (GLUCOPHAGE-XR) 500 MG 24 hr tablet Take 500 mg by mouth 3 (three) times daily. 150057motal daily     predniSONE (DELTASONE) 20 MG tablet TAKE 2 TABLETS BY MOUTH DAILY 60 tablet 2   pregabalin (LYRICA) 150 MG capsule Take 1 capsule (150 mg total) by mouth 2 (two) times daily. (Patient taking differently: Take 150 mg by mouth in the morning, at noon, and at bedtime.) 60 capsule 3   multivitamin-iron-minerals-folic acid (CENTRUM) chewable tablet Chew 1 tablet by mouth daily. (Patient not taking: Reported on 06/28/2021)     sulfamethoxazole-trimethoprim (BACTRIM DS) 800-160 MG tablet Take 1 tablet by mouth 3 (three) times a week. (Patient not taking: Reported  on 06/28/2021) 12 tablet 5   pantoprazole (PROTONIX) 40 MG tablet Take 2 tablets (80 mg total) by mouth daily. (Patient not taking: Reported on 06/28/2021) 30 tablet 11   No facility-administered medications prior to visit.    Review of Systems  Review of Systems  Constitutional: Negative.   Respiratory:  Positive for cough and shortness of breath.   Gastrointestinal: Negative.        Hiccups   Musculoskeletal:  Positive for arthralgias.  Neurological:  Positive for headaches.    Physical Exam  BP 116/80 (BP Location: Left Arm, Patient Position: Sitting, Cuff Size: Normal)   Pulse (!) 117   Temp 97.9 F (36.6 C) (Oral)   Ht 5' 5.5" (1.664 m)   Wt 190 lb (86.2 kg)   SpO2 93%   BMI 31.14 kg/m  Physical Exam Constitutional:      Appearance: Normal appearance.  HENT:     Head: Normocephalic and atraumatic.     Mouth/Throat:     Comments: Deferred d.t masking  Cardiovascular:     Rate and Rhythm: Normal rate and regular rhythm.  Pulmonary:     Effort: Pulmonary effort is normal.     Breath sounds: Normal breath sounds. No wheezing,  rhonchi or rales.     Comments: No rales were heard on exam  Neurological:     General: No focal deficit present.     Mental Status: She is alert and oriented to person, place, and time. Mental status is at baseline.  Psychiatric:        Mood and Affect: Mood normal.        Behavior: Behavior normal.        Thought Content: Thought content normal.        Judgment: Judgment normal.     Lab Results:  CBC    Component Value Date/Time   WBC 9.6 12/13/2019 0252   RBC 3.99 12/13/2019 0252   HGB 11.4 (L) 12/13/2019 0252   HCT 35.8 (L) 12/13/2019 0252   PLT 241 12/13/2019 0252   MCV 89.7 12/13/2019 0252   MCH 28.6 12/13/2019 0252   MCHC 31.8 12/13/2019 0252   RDW 14.0 12/13/2019 0252   LYMPHSABS 1.6 12/02/2019 0831   MONOABS 0.9 12/02/2019 0831   EOSABS 0.0 12/02/2019 0831   BASOSABS 0.0 12/02/2019 0831    BMET    Component Value Date/Time   NA 136 12/13/2019 0252   K 4.1 12/13/2019 0252   CL 99 12/13/2019 0252   CO2 29 12/13/2019 0252   GLUCOSE 197 (H) 12/13/2019 0252   BUN 9 12/13/2019 0252   CREATININE 0.82 12/13/2019 0252   CREATININE 0.75 03/25/2019 1610   CALCIUM 8.5 (L) 12/13/2019 0252   GFRNONAA >60 12/13/2019 0252   GFRAA >60 12/13/2019 0252    BNP No results found for: BNP  ProBNP    Component Value Date/Time   PROBNP 24.0 12/02/2019 0831    Imaging: No results found.   Assessment & Plan:   ILD (interstitial lung disease) () - Symptomatically patient feels she is worse. She reports increased shortness of breath, coughing fits, chest tightness and inability to exercise. She does not improve with oral prednisone and this was tapered off in June by Dr. [place] Browner. Discussed case with him today. Plan get HRCT now and assess for progression of ILD. We will trial her on Breztri Aerosphere two puffs twice daily and send in Rx for new nebulizer machine + ipratropium-albuterol. She has follow-up scheduled in December with  Dr. [place]an Browner.   Hiccups - Resume  Protonix 68m once daily   Iron deficiency - Patient is taking ferrous sulfate 365 mg (65 fe) daily - Checking CBC and iron panel   EMartyn Ehrich NP 06/28/2021

## 2021-06-28 NOTE — Assessment & Plan Note (Signed)
-   Resume Protonix 40mg  once daily

## 2021-06-28 NOTE — Patient Instructions (Addendum)
Recommendations: Stop Trelegy for now [employer] - two two puffs morning and evening (start tomorrow) Resume Protonix - take 30-60 min before first meal of the day Sending in order for nebulizer machine and medication We will work on moving up CT chest   Orders: CBC and iron panel (please order- got to [facility] for labs) DME order for nebulizer machine (please order)  Follow-up: Keep apt with [name] in December

## 2021-06-28 NOTE — Assessment & Plan Note (Signed)
-   Symptomatically patient feels she is worse. She reports increased shortness of breath, coughing fits, chest tightness and inability to exercise. She does not improve with oral prednisone and this was tapered off in June by [name]. Discussed case with him today. Plan get HRCT now and assess for progression of ILD. We will trial her on Breztri Aerosphere two puffs twice daily and send in Rx for new nebulizer machine + ipratropium-albuterol. She has follow-up scheduled in December with [name].

## 2021-07-01 ENCOUNTER — Telehealth: Payer: Self-pay | Admitting: Pulmonary Disease

## 2021-07-02 ENCOUNTER — Other Ambulatory Visit: Payer: Self-pay | Admitting: Pulmonary Disease

## 2021-07-02 ENCOUNTER — Other Ambulatory Visit (INDEPENDENT_AMBULATORY_CARE_PROVIDER_SITE_OTHER): Payer: 59

## 2021-07-02 DIAGNOSIS — J849 Interstitial pulmonary disease, unspecified: Secondary | ICD-10-CM

## 2021-07-02 LAB — CBC
HCT: 41.6 % (ref 36.0–46.0)
Hemoglobin: 13.7 g/dL (ref 12.0–15.0)
MCHC: 33 g/dL (ref 30.0–36.0)
MCV: 91.9 fl (ref 78.0–100.0)
Platelets: 274 10*3/uL (ref 150.0–400.0)
RBC: 4.53 Mil/uL (ref 3.87–5.11)
RDW: 13 % (ref 11.5–15.5)
WBC: 8.2 10*3/uL (ref 4.0–10.5)

## 2021-07-02 LAB — IBC PANEL
Iron: 72 ug/dL (ref 42–145)
Saturation Ratios: 21.8 % (ref 20.0–50.0)
TIBC: 330.4 ug/dL (ref 250.0–450.0)
Transferrin: 236 mg/dL (ref 212.0–360.0)

## 2021-07-02 NOTE — Progress Notes (Signed)
New orders placed per Irma.

## 2021-07-02 NOTE — Telephone Encounter (Signed)
I have this order I will get the auth then schedule it since [facility]

## 2021-07-02 NOTE — Telephone Encounter (Signed)
I have this order I will get the auth then schedule the patient

## 2021-07-04 ENCOUNTER — Other Ambulatory Visit: Payer: Self-pay

## 2021-07-04 ENCOUNTER — Ambulatory Visit (HOSPITAL_BASED_OUTPATIENT_CLINIC_OR_DEPARTMENT_OTHER)
Admission: RE | Admit: 2021-07-04 | Discharge: 2021-07-04 | Disposition: A | Discharge status: Home / Self Care | Payer: 59 | Source: Ambulatory Visit | Attending: Pulmonary Disease | Admitting: Pulmonary Disease

## 2021-07-04 DIAGNOSIS — J849 Interstitial pulmonary disease, unspecified: Secondary | ICD-10-CM | POA: Insufficient documentation

## 2021-07-05 MED ORDER — BREZTRI AEROSPHERE 160-9-4.8 MCG/ACT IN AERO: 2.0000 | INHALATION_SPRAY | Freq: Two times a day (BID) | RESPIRATORY_TRACT | 6 refills | Status: DC

## 2021-07-08 ENCOUNTER — Telehealth: Payer: Self-pay | Admitting: Pulmonary Disease

## 2021-07-08 NOTE — Telephone Encounter (Signed)
Placed Breztri sample behind check in desk for pt pick up. [name] pt would like to discuss CT results, please advise.

## 2021-07-08 NOTE — Telephone Encounter (Signed)
Called and spoke with Patient.  Offered first available appointment with [name]. Patient does not want to wait until after Thanksgiving to discuss CT results.  Patient request [name] call Patient to discuss results.  Patient already has follow up OV scheduled 08/08/21.    Result note received 07/08/21 from [name].  Please make appointment with me at next available to discuss. Will need 30 mins slot.    Message routed to [name] to advise

## 2021-07-09 NOTE — Telephone Encounter (Signed)
I called and discussed CT results with patient. She has an appointment with me on 11/28

## 2021-07-22 ENCOUNTER — Ambulatory Visit: Payer: 59 | Admitting: Pulmonary Disease

## 2021-07-22 ENCOUNTER — Encounter: Payer: Self-pay | Admitting: Pulmonary Disease

## 2021-07-22 ENCOUNTER — Other Ambulatory Visit: Payer: Self-pay

## 2021-07-22 VITALS — BP 126/74 | HR 90 | Temp 98.3°F | Ht 67.0 in | Wt 188.0 lb

## 2021-07-22 DIAGNOSIS — R0602 Shortness of breath: Secondary | ICD-10-CM | POA: Diagnosis not present

## 2021-07-22 DIAGNOSIS — J849 Interstitial pulmonary disease, unspecified: Secondary | ICD-10-CM | POA: Diagnosis not present

## 2021-07-22 DIAGNOSIS — E611 Iron deficiency: Secondary | ICD-10-CM

## 2021-07-22 LAB — CBC WITH DIFFERENTIAL/PLATELET
Basophils Absolute: 0.1 10*3/uL (ref 0.0–0.1)
Basophils Relative: 0.7 % (ref 0.0–3.0)
Eosinophils Absolute: 0.2 10*3/uL (ref 0.0–0.7)
Eosinophils Relative: 3 % (ref 0.0–5.0)
HCT: 41.4 % (ref 36.0–46.0)
Hemoglobin: 13.8 g/dL (ref 12.0–15.0)
Lymphocytes Relative: 13.2 % (ref 12.0–46.0)
Lymphs Abs: 1.1 10*3/uL (ref 0.7–4.0)
MCHC: 33.4 g/dL (ref 30.0–36.0)
MCV: 91.1 fl (ref 78.0–100.0)
Monocytes Absolute: 0.8 10*3/uL (ref 0.1–1.0)
Monocytes Relative: 9.5 % (ref 3.0–12.0)
Neutro Abs: 5.9 10*3/uL (ref 1.4–7.7)
Neutrophils Relative %: 73.6 % (ref 43.0–77.0)
Platelets: 287 10*3/uL (ref 150.0–400.0)
RBC: 4.54 Mil/uL (ref 3.87–5.11)
RDW: 13.4 % (ref 11.5–15.5)
WBC: 8.1 10*3/uL (ref 4.0–10.5)

## 2021-07-22 LAB — COMPREHENSIVE METABOLIC PANEL
ALT: 16 U/L (ref 0–35)
AST: 22 U/L (ref 0–37)
Albumin: 4.3 g/dL (ref 3.5–5.2)
Alkaline Phosphatase: 73 U/L (ref 39–117)
BUN: 7 mg/dL (ref 6–23)
CO2: 27 mEq/L (ref 19–32)
Calcium: 9.1 mg/dL (ref 8.4–10.5)
Chloride: 101 mEq/L (ref 96–112)
Creatinine, Ser: 0.73 mg/dL (ref 0.40–1.20)
GFR: 98.78 mL/min (ref >60.00)
Glucose, Bld: 103 mg/dL — ABNORMAL HIGH (ref 70–99)
Potassium: 3.9 mEq/L (ref 3.5–5.1)
Sodium: 138 mEq/L (ref 135–145)
Total Bilirubin: 0.5 mg/dL (ref 0.2–1.2)
Total Protein: 6.9 g/dL (ref 6.0–8.3)

## 2021-07-22 LAB — HEMOGLOBIN A1C: Hgb A1c MFr Bld: 6.1 % (ref 4.6–6.5)

## 2021-07-22 MED ORDER — MYCOPHENOLATE MOFETIL 500 MG PO TABS: 500.0000 mg | ORAL_TABLET | Freq: Two times a day (BID) | ORAL | 5 refills | Status: DC

## 2021-07-22 NOTE — Progress Notes (Signed)
[name]    [id number]    1976/01/27  Primary Care Physician:Skakle, Liane Comber DO  Referring Physician: Sueanne Margarita, [address] [place],  [place] [address]  Chief complaint: Follow-up for interstitial lung disease, hypersensitivity pneumonitis.  HPI: 45 year old with history of fibromyalgia, depression, anxiety Referred for evaluation of abnormal CT scan, ILD  Complains of increasing dyspnea with chest pressure since mid August 2020.  She had a CTA done by her primary care which did not show any embolism but showed bilateral diffuse pulmonary infiltrates.  COVID-19 test was negative.  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 Trudie Reed, rheumatology.  She had a work-up done including labs which were reportedly negative.  CTD serologies and other labs were negative as well.  She underwent bronchoscopy on 07/12/2019 with findings of lymphocytosis on BAL and granulomas. 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   She was restarted on prednisone post COVID due to worsening dyspnea and tried to taper again  At last visit on 10/26/19 prednisone was down to 5 mg but continued to have persistent symptoms with CT scan showing evolving interstitial lung disease.  Prednisone increased to 40 mg a day and surgical lung biopsy done on 12/12/19  Finished pulmonary rehab at the end of 2021 which did not help with symptoms She was eventually tapered off steroids in 2021  In the interim she has followed up with Dr. 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  She has enrolled in long-term Covid clinic at [facility] and underwent a cardiac MRI and had a xenon gas lung scan for research purposes  Revaluated in March 2022 with worsening dyspnea, chest tightness. Plan discussed over telephone with her pulmonologist at [facility] who suggested a trial of prednisone 40 mg a day for 1 month to see if there is any improvement.  She completed a trial of prednisone with no change on 6-minute walk, CT and PFTs. Overall she feels that the prednisone has not helped.  She has had weight gain with the prednisone and elevated heart to control blood sugars  Pets: Has dogs.  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  She has a down comforter for many years and got a down pillow in 2019. She got rid of the down after bronchoscopy in late 2020 Smoking history: No significant smoking history Travel history: Previously lived in [place], [place].  Has been living in [place] for the past 18 years.  No significant recent travel Relevant family history: Grandfather died of lung cancer.  No other significant family history of lung disease.  Interim history:  Follow-up CT earlier this month showed worsening interstitial changes with fibrosis and groundglass opacities.  She continues to be dyspneic and is here to discuss treatment options  Outpatient Encounter Medications as of 07/22/2021  Medication Sig   acetaminophen (TYLENOL) 650 MG CR tablet Take 650 mg by mouth every 8 (eight) hours as needed for pain.  albuterol (VENTOLIN HFA) 108 (90 Base) MCG/ACT inhaler Inhale into the lungs.   Budeson-Glycopyrrol-Formoterol (BREZTRI AEROSPHERE) 160-9-4.8 MCG/ACT AERO Inhale 2 puffs into the lungs in the morning and at bedtime.   busPIRone (BUSPAR) 10 MG tablet Take 10 mg by mouth 2 (two) times daily.   doxycycline (DORYX) 100 MG EC  tablet Take 100 mg by mouth daily.   DULoxetine (CYMBALTA) 60 MG capsule TAKE 1 CAPSULE BY MOUTH EVERY DAY (Patient taking differently: Take 60 mg by mouth daily.)   ferrous sulfate 324 MG TBEC Take 324 mg by mouth.   ipratropium-albuterol (DUONEB) 0.5-2.5 (3) MG/3ML SOLN Take 3 mLs by nebulization every 6 (six) hours as needed.   levothyroxine (SYNTHROID) 125 MCG tablet Take 125 mcg by mouth daily before breakfast.   metFORMIN (GLUCOPHAGE-XR) 500 MG 24 hr tablet Take 500 mg by mouth 3 (three) times daily. 1572m total daily   multivitamin-iron-minerals-folic acid (CENTRUM) chewable tablet Chew 1 tablet by mouth daily.   pantoprazole (PROTONIX) 40 MG tablet Take 1 tablet (40 mg total) by mouth daily.   pregabalin (LYRICA) 150 MG capsule Take 1 capsule (150 mg total) by mouth 2 (two) times daily. (Patient taking differently: Take 150 mg by mouth in the morning, at noon, and at bedtime.)   [DISCONTINUED] ferrous sulfate 325 (65 FE) MG tablet Take 325 mg by mouth daily with breakfast.   [DISCONTINUED] Iron, Ferrous Sulfate, 325 (65 Fe) MG TABS Take 1 tablet by mouth daily.   budesonide-formoterol (SYMBICORT) 160-4.5 MCG/ACT inhaler Inhale 2 puffs into the lungs 2 (two) times daily.   Fluticasone-Umeclidin-Vilant (TRELEGY ELLIPTA) 200-62.5-25 MCG/INH AEPB Inhale 1 puff into the lungs daily.   predniSONE (DELTASONE) 20 MG tablet TAKE 2 TABLETS BY MOUTH DAILY   sulfamethoxazole-trimethoprim (BACTRIM DS) 800-160 MG tablet Take 1 tablet by mouth 3 (three) times a week. (Patient not taking: Reported on 06/28/2021)   No facility-administered encounter medications on file as of 07/22/2021.   Physical Exam: Blood pressure 126/74, pulse 90, temperature 98.3 F (36.8 C), temperature source Oral, height _0  (1.702 m), weight 188 lb (85.3 kg), SpO2 97 %. Gen:      No acute distress HEENT:  EOMI, sclera anicteric Neck:     No masses; no thyromegaly Lungs:    Clear to auscultation bilaterally; normal  respiratory effort CV:         Regular rate and rhythm; no murmurs Abd:      + bowel sounds; soft, non-tender; no palpable masses, no distension Ext:    No edema; adequate peripheral perfusion Skin:      Warm and dry; no rash Neuro: alert and oriented x 3 Psych: normal mood and affect   Data Reviewed: Imaging: CTA 06/15/2019-no pulmonary embolism, mild diffuse interstitial prominence with bilateral groundglass and nodular airspace opacities.    High-res CT 07/11/2019-Bilateral fine nodularity with groundglass with no significant air trapping.  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  High-res CT 11/02/2019-subpleural reticulation with groundglass, traction bronchiectasis no basal gradient.  Indeterminate for UIP.  High-res CT 05/09/2020-stable interstitial lung disease  High-res CT 11/02/2020-minimal progression of fibrotic lung disease with groundglass opacities  High-res CT 01/08/2021-stable ILD compared to March 2022  High-res CT 07/04/2021-significantly increased groundglass opacities with progression of fibrotic changes.  I reviewed the images personally.   [facility] 2.53 [68%], FEV1 2.26 [75%], F/F 89, SVC 1.75 [50%], DLCO 14.61 [65%] Moderate restriction, diffusion defect.  11/16/2020 FVC 2.14 [5%), FEV1 1.95 (62%], F/F 91, TLC 2.24 (41%), DLCO 16.02 (  69%] Severe restriction, mild diffusion defect.  01/17/2021 FVC 2.38 (61%), FEV1 2.12 (68%), F/F 89, TLC 3.86 [72%), DLCO 14.60 (63%) Severe restriction, mild diffusion defect  6-minute walk 12/27/2020- 38 m  Labs: ANA 03/25/2019-negative,  Rheumatoid factor 03/25/2019- < 14 Repeat CTD serologies 07/18/2019-negative, hypersensitivity panel-negative  CBC 06/15/2019-WBC 5.87, eos 6.4%, absolute eosinophil count 383 Metabolic panel 33/83/2919-TYOMAY normal limits D-dimer 06/30/2019-0.41 BNP 06/30/2019-10.   Bronchoscopy 07/12/2019 WBC 510, lymphocytes 38%, eos 12%, Microbiology-negative Cytology -no  malignancy Transbronchial biopsy-nonnecrotizing epithelioid granulomas SARS-CoV-2, viral PCR-negative  Surgical lung biopsy 12/12/2019- features suggestive of chronic hypersensitivity pneumonitis with heterogeneous fibrosis and superimposed subacute lung injury.  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  Surgical lung biopsy discussed at our conference and at with no abnormalities agree with diagnosis of chronic hypersensitivity pneumonitis, post Covid ILD  She has been stable for some time, off steroids but now has increasing chest tightness, dyspnea. Repeat CT reviewed with worsening ILD, pulmonary fibrosis with groundglass  It is unclear to me as to why she is worsening now.  She has not responded to multiple rounds of prednisone including prolonged taper earlier this year. There is low suspicion for any infectious etiology I had a long discussion with her and her husband today. Given worsening changes on CT and by symptoms we have decided to start immunosuppression with CellCept.  Since prednisone is not helping and she has side effects to steroids.  She eventually may need to be started on Ofev once stabilized on CellCept   Plan/Recommendations: - Baseline labs including CBC,  CMP - Start CellCept 500 mg twice daily  Follow-up in 1 month for monitoring  Marshell Garfinkel MD [facility] 07/22/2021, 12:16 PM  CC: Sueanne Margarita, DO

## 2021-07-22 NOTE — Patient Instructions (Signed)
We will start you on CellCept 500 mg twice daily Check CBC, CMP and hemoglobin A1c today  Follow-up in 1 month with either in person or video visit

## 2021-07-24 ENCOUNTER — Encounter: Payer: Self-pay | Admitting: Pulmonary Disease

## 2021-08-07 ENCOUNTER — Ambulatory Visit: Payer: 59 | Admitting: Pulmonary Disease

## 2021-08-08 ENCOUNTER — Ambulatory Visit: Payer: 59 | Admitting: Pulmonary Disease

## 2021-08-14 ENCOUNTER — Telehealth: Payer: Self-pay | Admitting: Pulmonary Disease

## 2021-08-14 NOTE — Telephone Encounter (Signed)
Called and spoke with patient to let her know of recs from Maralyn Sago. Advised patient that I was also going to forward this message to [name] to make him aware. Advised her that he is not back in the office until 08/28/20 and she is already scheduled for an appt with [name] on that day. Advised her that we are going to keep that appt and if she needs to be seen sooner for her to call. Nothing further needed at this time.    Crystal Ngo, NP  [name], CMA; [facility] Triage Pool 8 minutes ago (5:23 PM)   The Cellcept could possibly be suppressing her immune system, and causing a rosacea flare. She needs to stop taking it and she needs an appointment to be seen by [name]  as soon as possible so he can determine other options for treatment of her ILD. She can take benadryl to manage the itching / if she develops any respiratory distress she needs to seek emergency care.

## 2021-08-14 NOTE — Telephone Encounter (Signed)
Spoke with the pt  She states that she has been having issue with dry skin since started cellcept on 07/22/21  She says her skin feels dry "all over" but esp worse on her face with itchy bumps that she says appear to to hives  She does had hx of rosacea, but feels this is different  She wonders if this could be a side effect of cellcept  She denies any changes in her breathing or other symptoms  Please advise thanks

## 2021-08-14 NOTE — Telephone Encounter (Signed)
Pt has questions about new anti-inflammatory medication he gave her. Pt states it is cellcept- pt is having extreme dry skin on face. Pt states her skin is dry everywhere and that her face is the worse. Pt not sure if this is a reaction. Pt states it looks like she has hives on her face. Very itchy and bumpy.Please advise [phone]

## 2021-08-28 ENCOUNTER — Other Ambulatory Visit: Payer: Self-pay

## 2021-08-28 ENCOUNTER — Encounter: Payer: Self-pay | Admitting: Pulmonary Disease

## 2021-08-28 ENCOUNTER — Ambulatory Visit: Payer: 59 | Admitting: Pulmonary Disease

## 2021-08-28 VITALS — BP 136/70 | HR 100 | Temp 97.9°F | Ht 66.5 in | Wt 185.4 lb

## 2021-08-28 DIAGNOSIS — Z23 Encounter for immunization: Secondary | ICD-10-CM

## 2021-08-28 DIAGNOSIS — J849 Interstitial pulmonary disease, unspecified: Secondary | ICD-10-CM

## 2021-08-28 DIAGNOSIS — Z5181 Encounter for therapeutic drug level monitoring: Secondary | ICD-10-CM

## 2021-08-28 LAB — COMPREHENSIVE METABOLIC PANEL
ALT: 15 U/L (ref 0–35)
AST: 20 U/L (ref 0–37)
Albumin: 3.9 g/dL (ref 3.5–5.2)
Alkaline Phosphatase: 57 U/L (ref 39–117)
BUN: 12 mg/dL (ref 6–23)
CO2: 23 mEq/L (ref 19–32)
Calcium: 8.6 mg/dL (ref 8.4–10.5)
Chloride: 102 mEq/L (ref 96–112)
Creatinine, Ser: 0.67 mg/dL (ref 0.40–1.20)
GFR: 104.91 mL/min (ref >60.00)
Glucose, Bld: 166 mg/dL — ABNORMAL HIGH (ref 70–99)
Potassium: 3.9 mEq/L (ref 3.5–5.1)
Sodium: 135 mEq/L (ref 135–145)
Total Bilirubin: 0.4 mg/dL (ref 0.2–1.2)
Total Protein: 6.6 g/dL (ref 6.0–8.3)

## 2021-08-28 LAB — CBC WITH DIFFERENTIAL/PLATELET
Basophils Absolute: 0 10*3/uL (ref 0.0–0.1)
Basophils Relative: 0.7 % (ref 0.0–3.0)
Eosinophils Absolute: 0.3 10*3/uL (ref 0.0–0.7)
Eosinophils Relative: 3.4 % (ref 0.0–5.0)
HCT: 41 % (ref 36.0–46.0)
Hemoglobin: 13.4 g/dL (ref 12.0–15.0)
Lymphocytes Relative: 13.4 % (ref 12.0–46.0)
Lymphs Abs: 1 10*3/uL (ref 0.7–4.0)
MCHC: 32.8 g/dL (ref 30.0–36.0)
MCV: 92.2 fl (ref 78.0–100.0)
Monocytes Absolute: 0.6 10*3/uL (ref 0.1–1.0)
Monocytes Relative: 8.6 % (ref 3.0–12.0)
Neutro Abs: 5.5 10*3/uL (ref 1.4–7.7)
Neutrophils Relative %: 73.9 % (ref 43.0–77.0)
Platelets: 282 10*3/uL (ref 150.0–400.0)
RBC: 4.44 Mil/uL (ref 3.87–5.11)
RDW: 13.4 % (ref 11.5–15.5)
WBC: 7.5 10*3/uL (ref 4.0–10.5)

## 2021-08-28 LAB — HEPATIC FUNCTION PANEL
ALT: 15 U/L (ref 0–35)
AST: 20 U/L (ref 0–37)
Albumin: 3.9 g/dL (ref 3.5–5.2)
Alkaline Phosphatase: 57 U/L (ref 39–117)
Bilirubin, Direct: 0.1 mg/dL (ref 0.0–0.3)
Total Bilirubin: 0.4 mg/dL (ref 0.2–1.2)
Total Protein: 6.6 g/dL (ref 6.0–8.3)

## 2021-08-28 NOTE — Patient Instructions (Signed)
Agree with stopping the CellCept Will recheck labs today including CMP, CBC, TPMT, QuantiFERON and hepatitis panel Based on those results we may start you on alternate medication called azathioprine Follow-up in 1 to 2 months with in person or video visit

## 2021-08-28 NOTE — Addendum Note (Signed)
Addended by: Jacquiline Doe on: 08/28/2021 09:47 AM   Modules accepted: Orders

## 2021-08-28 NOTE — Addendum Note (Signed)
Addended by: Jacquiline Doe on: 08/28/2021 12:41 PM   Modules accepted: Orders

## 2021-08-28 NOTE — Progress Notes (Signed)
[name]    [id number]    [birth year]  Primary Care Physician:Skakle, Liane Comber DO  Referring Physician: Sueanne Margarita, [address] [place],   [id number]  Chief complaint: Follow-up for interstitial lung disease, hypersensitivity pneumonitis.  HPI: 46 year old with history of fibromyalgia, depression, anxiety Referred for evaluation of abnormal CT scan, ILD  Complains of increasing dyspnea with chest pressure since mid August 2020.  She had a CTA done by her primary care which did not show any embolism but showed bilateral diffuse pulmonary infiltrates.  COVID-19 test was negative.  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 Trudie Reed, rheumatology.  She had a work-up done including labs which were reportedly negative.  CTD serologies and other labs were negative as well.  She underwent bronchoscopy on 07/12/2019 with findings of lymphocytosis on BAL and granulomas. 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   She was restarted on prednisone post COVID due to worsening dyspnea and tried to taper again  At last visit on 10/26/19 prednisone was down to 5 mg but continued to have persistent symptoms with CT scan showing evolving interstitial lung disease.  Prednisone increased to 40 mg a day and surgical lung biopsy done on 12/12/19  Finished pulmonary rehab at the end of 2021 which did not help with symptoms She was eventually tapered off steroids in 2021  In the interim she has followed up with Dr. 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  She has enrolled in long-term Covid clinic at [facility] and underwent a cardiac MRI and had a xenon gas lung scan for research purposes  Revaluated in March 2022 with worsening dyspnea, chest tightness. Plan discussed over telephone with her pulmonologist at [facility] who suggested a trial of prednisone 40 mg a day for 1 month to see if there is any improvement.  She completed a trial of prednisone with no change on 6-minute walk, CT and PFTs. Overall she feels that the prednisone has not helped.  She has had weight gain with the prednisone and elevated heart to control blood sugars  Pets: Has dogs.  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  She has a down comforter for many years and got a down pillow in 2019. She got rid of the down after bronchoscopy in late 2020 Smoking history: No significant smoking history Travel history: Previously lived in [place], [place].  Has been living in [place] for the past 18 years.  No significant recent travel Relevant family history: Grandfather died of lung cancer.  No other significant family history of lung disease.  Interim history:  Follow-up CT earlier in late 2022 showed worsening interstitial changes with fibrosis and groundglass opacities.  She continues to be dyspneic.  Given worsening CT findings after discussion with her started CellCept however within a few weeks she had a flareup of her rosacea and CellCept stopped.  The skin lesion rash is improving now  States that  dyspnea is stable  Outpatient Encounter Medications as of 08/28/2021  Medication Sig   acetaminophen (TYLENOL) 650 MG CR tablet Take 650 mg by mouth every 8 (eight) hours as needed for pain.   albuterol (VENTOLIN HFA) 108 (90 Base) MCG/ACT inhaler Inhale into the lungs.   Budeson-Glycopyrrol-Formoterol (BREZTRI AEROSPHERE)  160-9-4.8 MCG/ACT AERO Inhale 2 puffs into the lungs in the morning and at bedtime.   busPIRone (BUSPAR) 10 MG tablet Take 10 mg by mouth 2 (two) times daily.   doxycycline (DORYX) 100 MG EC tablet Take 100 mg by mouth daily.   DULoxetine (CYMBALTA) 60 MG capsule TAKE 1 CAPSULE BY MOUTH EVERY DAY (Patient taking differently: Take 60 mg by mouth daily.)   ferrous sulfate 324 MG TBEC Take 324 mg by mouth.   ipratropium-albuterol (DUONEB) 0.5-2.5 (3) MG/3ML SOLN Take 3 mLs by nebulization every 6 (six) hours as needed.   JANUVIA 100 MG tablet    levothyroxine (SYNTHROID) 125 MCG tablet Take 125 mcg by mouth daily before breakfast.   metFORMIN (GLUCOPHAGE-XR) 500 MG 24 hr tablet Take 500 mg by mouth 3 (three) times daily. 1550m total daily   multivitamin-iron-minerals-folic acid (CENTRUM) chewable tablet Chew 1 tablet by mouth daily.   pregabalin (LYRICA) 150 MG capsule Take 1 capsule (150 mg total) by mouth 2 (two) times daily. (Patient taking differently: Take 150 mg by mouth in the morning, at noon, and at bedtime.)   budesonide-formoterol (SYMBICORT) 160-4.5 MCG/ACT inhaler Inhale 2 puffs into the lungs 2 (two) times daily.   Fluticasone-Umeclidin-Vilant (TRELEGY ELLIPTA) 200-62.5-25 MCG/INH AEPB Inhale 1 puff into the lungs daily.   mycophenolate (CELLCEPT) 500 MG tablet Take 1 tablet (500 mg total) by mouth 2 (two) times daily. (Patient not taking: Reported on 08/28/2021)   predniSONE (DELTASONE) 20 MG tablet TAKE 2 TABLETS BY MOUTH DAILY   sulfamethoxazole-trimethoprim (BACTRIM DS) 800-160 MG tablet Take 1 tablet by mouth 3 (three) times a week. (Patient not taking: Reported on 06/28/2021)   [DISCONTINUED] pantoprazole (PROTONIX) 40 MG tablet Take 1 tablet (40 mg total) by mouth daily.   No facility-administered encounter medications on file as of 08/28/2021.   Physical Exam: Gen:      No acute distress HEENT:  EOMI, sclera anicteric Neck:     No masses; no thyromegaly Lungs:    Clear to  auscultation bilaterally; normal respiratory effort CV:         Regular rate and rhythm; no murmurs Abd:      + bowel sounds; soft, non-tender; no palpable masses, no distension Ext:    No edema; adequate peripheral perfusion Skin:      Warm and dry; no rash Neuro: alert and oriented x 3 Psych: normal mood and affect   Data Reviewed: Imaging: CTA 06/15/2019-no pulmonary embolism, mild diffuse interstitial prominence with bilateral groundglass and nodular airspace opacities.    High-res CT 07/11/2019-Bilateral fine nodularity with groundglass with no significant air trapping.  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  High-res CT 11/02/2019-subpleural reticulation with groundglass, traction bronchiectasis no basal gradient.  Indeterminate for UIP.  High-res CT 05/09/2020-stable interstitial lung disease  High-res CT 11/02/2020-minimal progression of fibrotic lung disease with groundglass opacities  High-res CT 01/08/2021-stable ILD compared to March 2022  High-res CT 07/04/2021-significantly increased groundglass opacities with progression of fibrotic changes.  I reviewed the images personally.   [facility] 2.53 [68%], FEV1 2.26 [75%], F/F 89, SVC 1.75 [50%], DLCO 14.61 [65%] Moderate restriction, diffusion defect.  11/16/2020 FVC 2.14 [5%), FEV1 1.95 (  62%], F/F 91, TLC 2.24 (41%), DLCO 16.02 (69%] Severe restriction, mild diffusion defect.  01/17/2021 FVC 2.38 (61%), FEV1 2.12 (68%), F/F 89, TLC 3.86 [72%), DLCO 14.60 (63%) Severe restriction, mild diffusion defect  6-minute walk 12/27/2020- 32 m  Labs: ANA 03/25/2019-negative,  Rheumatoid factor 03/25/2019- < 14 Repeat CTD serologies 07/18/2019-negative, hypersensitivity panel-negative  CBC 06/15/2019-WBC 5.87, eos 6.4%, absolute eosinophil count 768 Metabolic panel 11/57/2620-BTDHRC normal limits D-dimer 06/30/2019-0.41 BNP 06/30/2019-10.   Bronchoscopy 07/12/2019 WBC 510, lymphocytes 38%, eos  12%, Microbiology-negative Cytology -no malignancy Transbronchial biopsy-nonnecrotizing epithelioid granulomas SARS-CoV-2, viral PCR-negative  Surgical lung biopsy 12/12/2019- features suggestive of chronic hypersensitivity pneumonitis with heterogeneous fibrosis and superimposed subacute lung injury.  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  Surgical lung biopsy discussed at our conference and at with no abnormalities agree with diagnosis of chronic hypersensitivity pneumonitis, post Covid ILD  She has been stable for some time, off steroids but now has increasing chest tightness, dyspnea. Repeat CT reviewed with worsening ILD, pulmonary fibrosis with groundglass  It is unclear to me as to why she is worsening now.  She has not responded to multiple rounds of prednisone including prolonged taper earlier this year. There is low suspicion for any infectious etiology. Given worsening changes on CT and by symptoms we have decided to start immunosuppression since prednisone is not helping and she has side effects to steroids.  She eventually may need to be started on Ofev down the line.  Has not tolerated CellCept Will check TPMT levels and consider azathioprine as an  alternative   Plan/Recommendations: - Baseline labs including CBC, CMP, TPMT, QuantiFERON and hepatitis panel - Holding CellCept.  Consider azathioprine  Follow-up in 1 month for monitoring  Marshell Garfinkel MD [facility] 08/28/2021, 9:38 AM  CC: Sueanne Margarita, DO

## 2021-09-02 ENCOUNTER — Encounter: Payer: Self-pay | Admitting: Pulmonary Disease

## 2021-09-02 LAB — QUANTIFERON-TB GOLD PLUS
Mitogen-NIL: >10 IU/mL
NIL: 0.04 IU/mL
QuantiFERON-TB Gold Plus: NEGATIVE
TB1-NIL: 0 IU/mL
TB2-NIL: 0.03 IU/mL

## 2021-09-02 LAB — THIOPURINE METHYLTRANSFERASE (TPMT), RBC: Thiopurine Methyltransferase, RBC: 15 nmol/hr/mL RBC

## 2021-09-03 NOTE — Telephone Encounter (Signed)
[name] please advise on the following My Chart message. I do see the lab results of requested labs have posted in Epic.   I see another set of lab results came in this evening. What if any, are the remaining we are pending? Id like to see about when Ill begin the new meds.  [name] [phone]  Thank you

## 2021-09-06 ENCOUNTER — Other Ambulatory Visit: Payer: Self-pay | Admitting: *Deleted

## 2021-09-06 MED ORDER — AZATHIOPRINE 50 MG PO TABS: ORAL_TABLET | ORAL | 2 refills | Status: DC

## 2021-09-13 IMAGING — CT CT ANGIO CHEST
2 of 6 series · 19 of 46 positions shown · IV contrast (OMNIPAQUE)
Comparison: None.

CLINICAL DATA: 43-year-old female with chest pain and shortness of
breath.

EXAM:
CT ANGIOGRAPHY CHEST WITH CONTRAST
TECHNIQUE: Multidetector CT imaging of the chest was performed using the
standard protocol during bolus administration of intravenous
contrast. Multiplanar CT image reconstructions and MIPs were
obtained to evaluate the vascular anatomy.
CONTRAST:  100mL OMNIPAQUE IOHEXOL 350 MG/ML SOLN

[Series 7: thins · axial · 0.63mm/px · z∈[-211,+41]mm · 17 of 278 slices shown]
[im 13/278  lung]
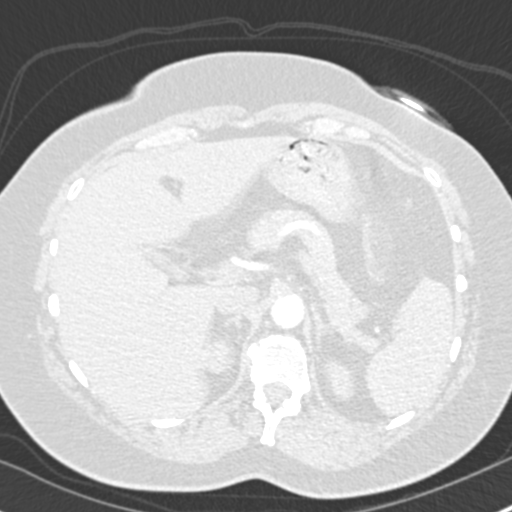
[im 25/278  soft-tissue]
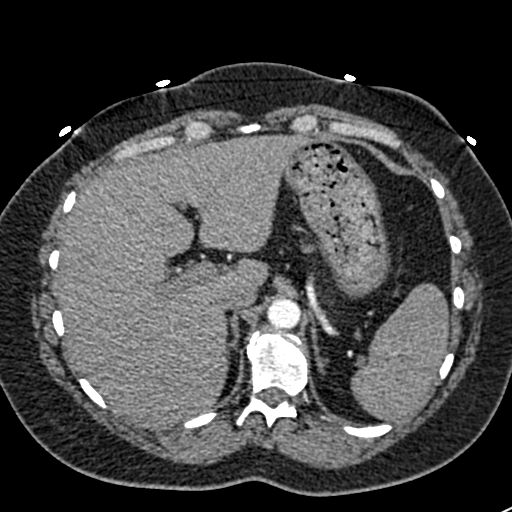
[im 49/278  lung]
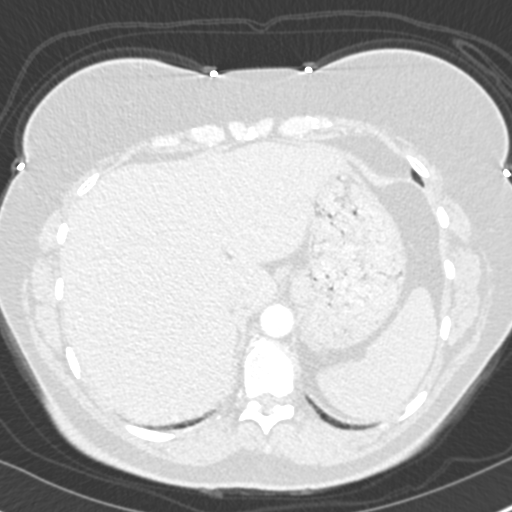
[im 61/278  soft-tissue]
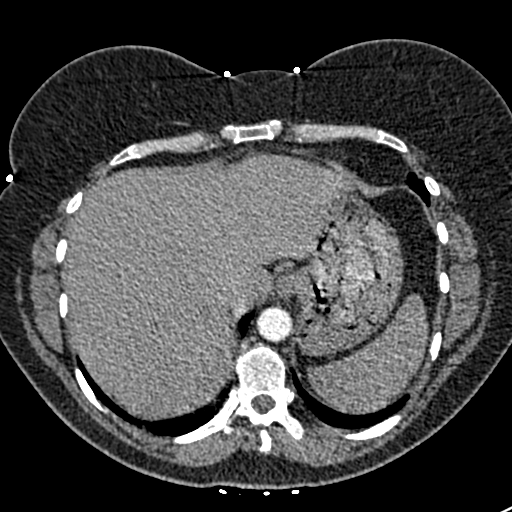
[im 73/278  lung]
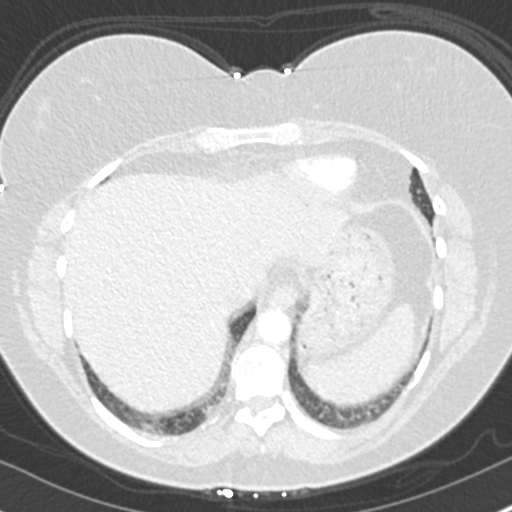
[im 97/278  soft-tissue]
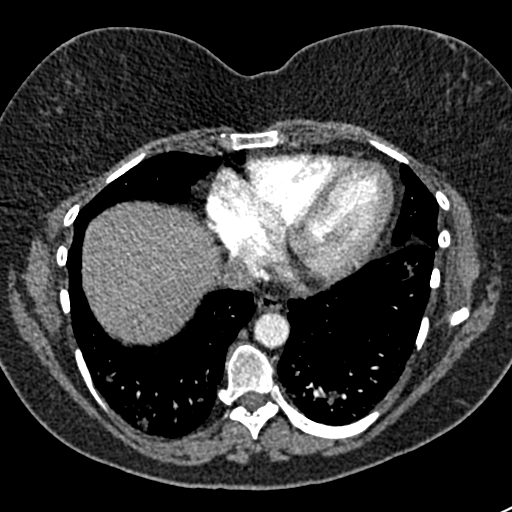
[im 109/278  lung]
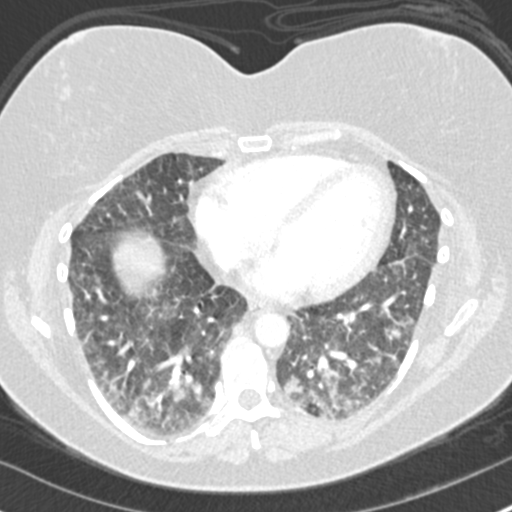
[im 121/278  soft-tissue]
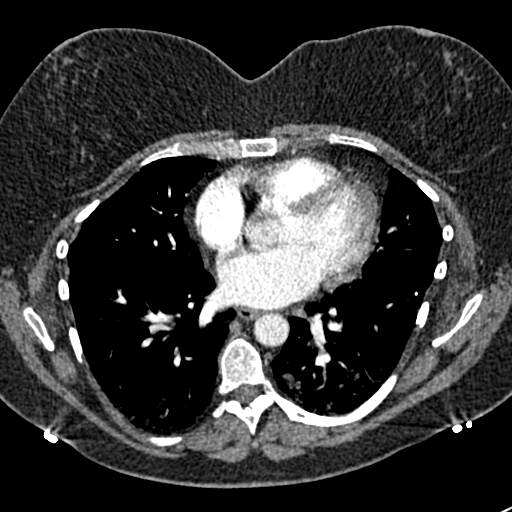
[im 145/278  lung]
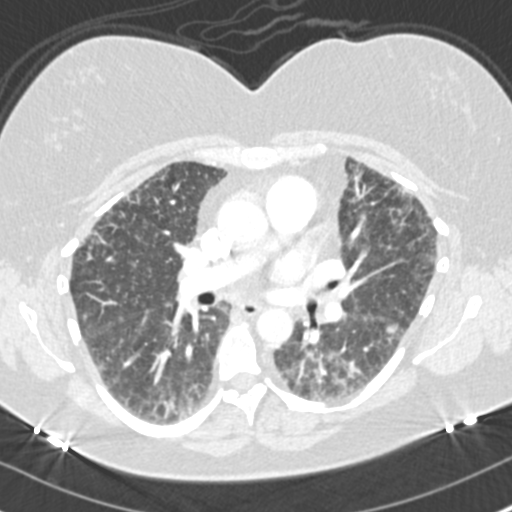
[im 157/278  soft-tissue]
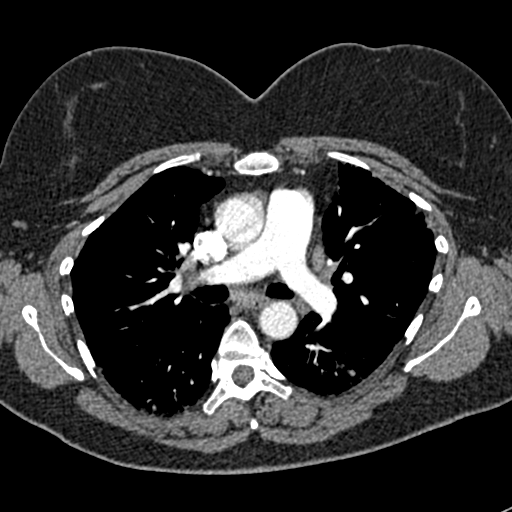
[im 169/278  lung]
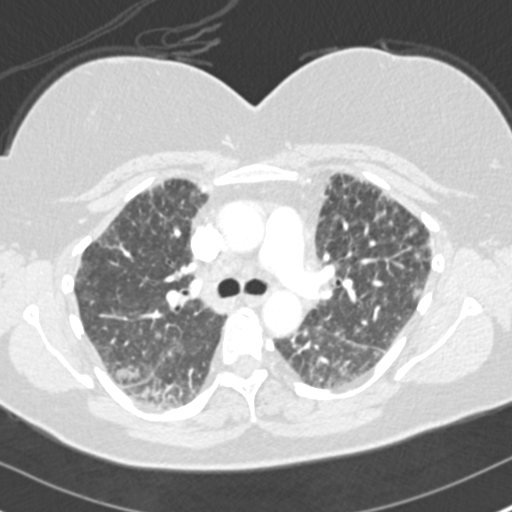
[im 181/278  soft-tissue]
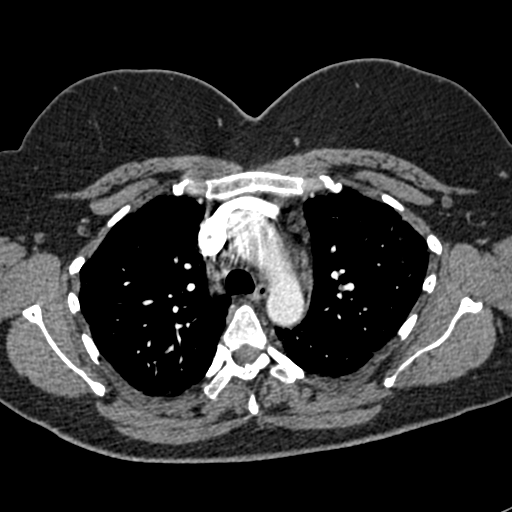
[im 205/278  lung]
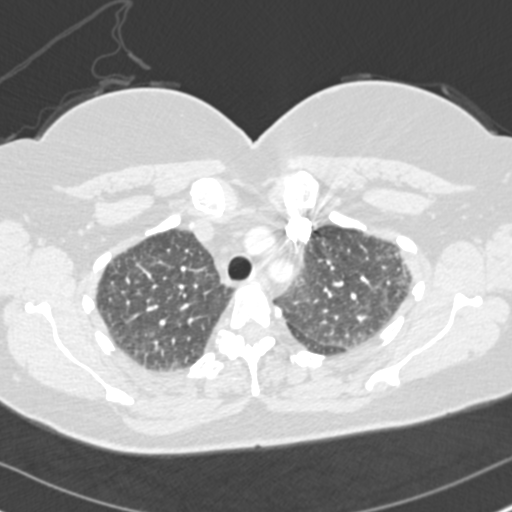
[im 217/278  soft-tissue]
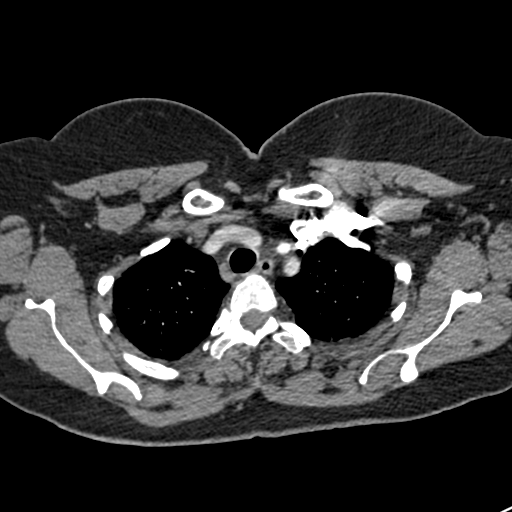
[im 229/278  lung]
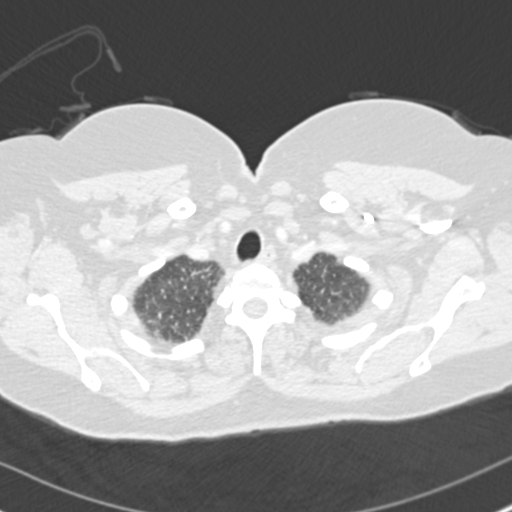
[im 253/278  soft-tissue]
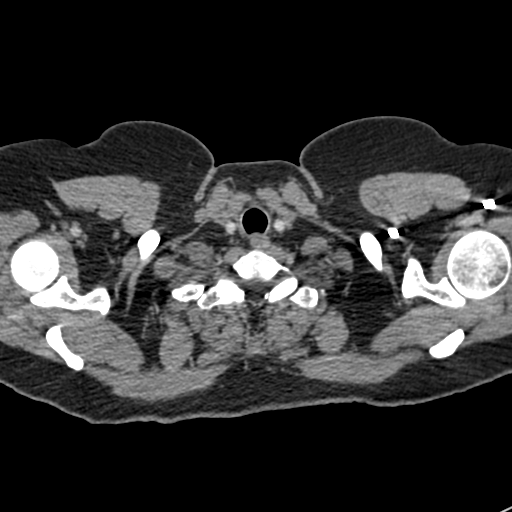
[im 265/278  lung]
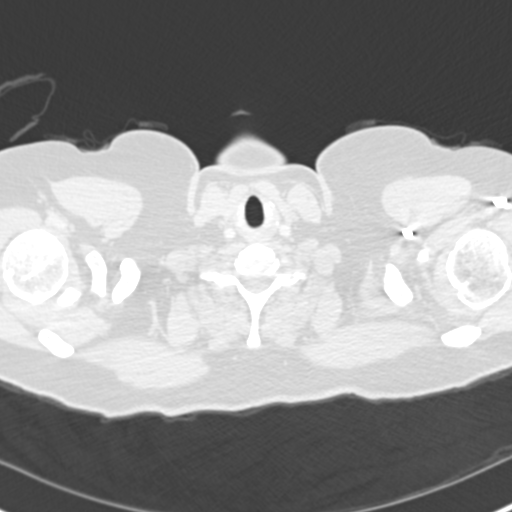

[Series 9: coronal mpr · coronal · 0.51mm/px · 2 of 73 slices shown]
[im 25/73  soft-tissue]
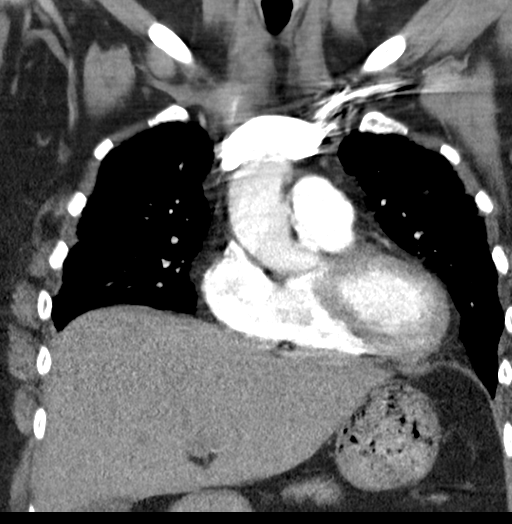
[im 49/73  soft-tissue]
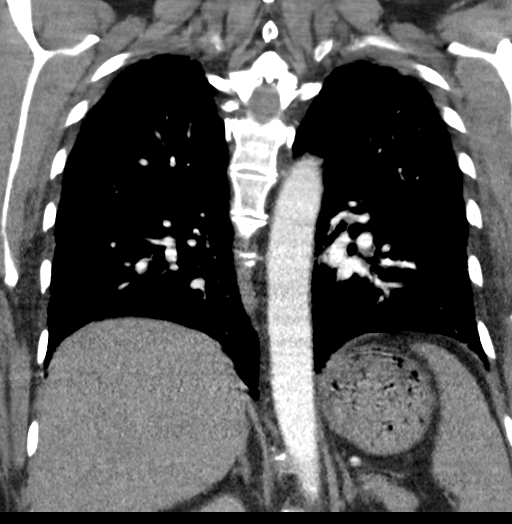

[19 of 46 positions shown; findings below may reference images not displayed]

FINDINGS: Evaluation of this exam is limited due to respiratory motion
artifact.

Cardiovascular: There is no cardiomegaly or pericardial effusion.
The thoracic aorta is unremarkable. Evaluation of the pulmonary
arteries is somewhat limited due to respiratory motion artifact and
suboptimal opacification and visualization of the peripheral
branches. No large or central pulmonary artery embolus identified.

Mediastinum/Nodes: Top-normal bilateral hilar lymph nodes measure
approximately 10 mm in the right hilum. No mediastinal adenopathy.
The esophagus is grossly unremarkable. No mediastinal fluid
collection.

Lungs/Pleura: Mild diffuse interstitial and interlobular septal
prominence. Bilateral ground-glass and nodular airspace opacities
most consistent with multifocal pneumonia, possibly viral or
atypical etiology. Clinical correlation and follow-up recommended.
No lobar consolidation, pleural effusion, or pneumothorax. The
central airways are patent.

Upper Abdomen: No acute abnormality.

Musculoskeletal: No chest wall abnormality. No acute or significant
osseous findings.

Review of the MIP images confirms the above findings.
IMPRESSION: 1. No CT evidence of central pulmonary artery embolus.
2. Multifocal pneumonia, possibly viral or atypical etiology.
Clinical correlation and follow-up recommended.

## 2021-09-23 ENCOUNTER — Other Ambulatory Visit: Payer: Self-pay | Admitting: Primary Care

## 2021-09-29 ENCOUNTER — Other Ambulatory Visit: Payer: Self-pay | Admitting: Pulmonary Disease

## 2021-10-04 ENCOUNTER — Telehealth (INDEPENDENT_AMBULATORY_CARE_PROVIDER_SITE_OTHER): Payer: 59 | Admitting: Pulmonary Disease

## 2021-10-04 ENCOUNTER — Encounter: Payer: Self-pay | Admitting: Pulmonary Disease

## 2021-10-04 ENCOUNTER — Other Ambulatory Visit (INDEPENDENT_AMBULATORY_CARE_PROVIDER_SITE_OTHER): Payer: 59

## 2021-10-04 DIAGNOSIS — Z5181 Encounter for therapeutic drug level monitoring: Secondary | ICD-10-CM | POA: Diagnosis not present

## 2021-10-04 DIAGNOSIS — J849 Interstitial pulmonary disease, unspecified: Secondary | ICD-10-CM | POA: Diagnosis not present

## 2021-10-04 LAB — CBC WITH DIFFERENTIAL/PLATELET
Basophils Absolute: 0 10*3/uL (ref 0.0–0.1)
Basophils Relative: 0.8 % (ref 0.0–3.0)
Eosinophils Absolute: 0.2 10*3/uL (ref 0.0–0.7)
Eosinophils Relative: 3.9 % (ref 0.0–5.0)
HCT: 41.1 % (ref 36.0–46.0)
Hemoglobin: 13.6 g/dL (ref 12.0–15.0)
Lymphocytes Relative: 20 % (ref 12.0–46.0)
Lymphs Abs: 1.3 10*3/uL (ref 0.7–4.0)
MCHC: 33 g/dL (ref 30.0–36.0)
MCV: 91.3 fl (ref 78.0–100.0)
Monocytes Absolute: 0.7 10*3/uL (ref 0.1–1.0)
Monocytes Relative: 10.6 % (ref 3.0–12.0)
Neutro Abs: 4.2 10*3/uL (ref 1.4–7.7)
Neutrophils Relative %: 64.7 % (ref 43.0–77.0)
Platelets: 266 10*3/uL (ref 150.0–400.0)
RBC: 4.51 Mil/uL (ref 3.87–5.11)
RDW: 13.3 % (ref 11.5–15.5)
WBC: 6.4 10*3/uL (ref 4.0–10.5)

## 2021-10-04 LAB — COMPREHENSIVE METABOLIC PANEL
ALT: 14 U/L (ref 0–35)
AST: 19 U/L (ref 0–37)
Albumin: 4 g/dL (ref 3.5–5.2)
Alkaline Phosphatase: 75 U/L (ref 39–117)
BUN: 14 mg/dL (ref 6–23)
CO2: 32 mEq/L (ref 19–32)
Calcium: 9.7 mg/dL (ref 8.4–10.5)
Chloride: 100 mEq/L (ref 96–112)
Creatinine, Ser: 0.68 mg/dL (ref 0.40–1.20)
GFR: 104.46 mL/min (ref >60.00)
Glucose, Bld: 92 mg/dL (ref 70–99)
Potassium: 4 mEq/L (ref 3.5–5.1)
Sodium: 139 mEq/L (ref 135–145)
Total Bilirubin: 0.4 mg/dL (ref 0.2–1.2)
Total Protein: 6.8 g/dL (ref 6.0–8.3)

## 2021-10-04 MED ORDER — AZATHIOPRINE 100 MG PO TABS: 100.0000 mg | ORAL_TABLET | Freq: Every day | ORAL | 2 refills | Status: DC

## 2021-10-04 NOTE — Patient Instructions (Signed)
We will check some labs today including CMP and CBC Increase azathioprine to 100 mg a day Follow-up in 1 month

## 2021-10-04 NOTE — Progress Notes (Signed)
[name]    [id number]    09/07/1975  Primary Care Physician:Skakle, Liane Comber DO  Referring Physician: Sueanne Margarita, [address],  [place] [address]  Virtual Visit via Video Note  I connected with Laqueena Hinchey on 10/04/21 at  9:45 AM EST by a video enabled telemedicine application and verified that I am speaking with the correct person using two identifiers.  Location: Patient: Home Provider: [address]   I discussed the limitations of evaluation and management by telemedicine and the availability of in person appointments. The patient expressed understanding and agreed to proceed.   Chief complaint: Follow-up for interstitial lung disease, hypersensitivity pneumonitis.  HPI: 46 year old with history of fibromyalgia, depression, anxiety Referred for evaluation of abnormal CT scan, ILD  Complains of increasing dyspnea with chest pressure since mid August 2020.  She had a CTA done by her primary care which did not show any embolism but showed bilateral diffuse pulmonary infiltrates.  COVID-19 test was negative.  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 Trudie Reed, rheumatology.  She had a work-up done including labs which were reportedly negative.  CTD serologies and other labs were negative as well.  She underwent bronchoscopy on 07/12/2019 with findings of lymphocytosis on BAL and granulomas. 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   She was restarted on prednisone post COVID due to worsening dyspnea and tried to  taper again  At last visit on 10/26/19 prednisone was down to 5 mg but continued to have persistent symptoms with CT scan showing evolving interstitial lung disease.  Prednisone increased to 40 mg a day and surgical lung biopsy done on 12/12/19  Finished pulmonary rehab at the end of 2021 which did not help with symptoms She was eventually tapered off steroids in 2021  In the interim she has followed up with Dr. 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  She has enrolled in long-term Covid clinic at [facility] and underwent a cardiac MRI and had a xenon gas lung scan for research purposes  Revaluated in March 2022 with worsening dyspnea, chest tightness. Plan discussed over telephone with her pulmonologist at [facility] who suggested a trial of prednisone 40 mg a day for 1 month to see if there is any improvement.  She completed a trial of prednisone with no change on 6-minute walk, CT and PFTs. Overall she feels that the prednisone has not helped.  She has had weight gain with the prednisone and elevated heart to control blood sugars  Pets: Has dogs.  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  She has a down comforter for many years and got a down pillow in 2019. She got rid of the down after bronchoscopy in late 2020 Smoking history: No significant smoking history Travel history: Previously lived in [place], [place].  Has been living in [place] for the past 18 years.  No significant recent travel Relevant family history: Merchant navy officer  died of lung cancer.  No other significant family history of lung disease.  Interim history:  Follow-up CT earlier in late 2022 showed worsening interstitial changes with fibrosis and groundglass opacities.  She continues to be dyspneic.  Given worsening CT findings after discussion with her started CellCept however within a  few weeks she had a flareup of her rosacea and CellCept stopped.  The skin lesion rash is improving now  Started on azathioprine in January 2023.  She is tolerating it well so far  Outpatient Encounter Medications as of 10/04/2021  Medication Sig   acetaminophen (TYLENOL) 650 MG CR tablet Take 650 mg by mouth every 8 (eight) hours as needed for pain.   albuterol (VENTOLIN HFA) 108 (90 Base) MCG/ACT inhaler Inhale into the lungs.   azaTHIOprine (IMURAN) 50 MG tablet TAKE 1/2 TABLET BY MOUTH DAILY   Budeson-Glycopyrrol-Formoterol (BREZTRI AEROSPHERE) 160-9-4.8 MCG/ACT AERO Inhale 2 puffs into the lungs in the morning and at bedtime.   busPIRone (BUSPAR) 10 MG tablet Take 10 mg by mouth 2 (two) times daily.   doxycycline (DORYX) 100 MG EC tablet Take 100 mg by mouth daily.   DULoxetine (CYMBALTA) 60 MG capsule TAKE 1 CAPSULE BY MOUTH EVERY DAY (Patient taking differently: Take 60 mg by mouth daily.)   ferrous sulfate 324 MG TBEC Take 324 mg by mouth.   ipratropium-albuterol (DUONEB) 0.5-2.5 (3) MG/3ML SOLN TAKE 3 MLS BY NEBULIZATION EVERY 6 (SIX) HOURS AS NEEDED.   JANUVIA 100 MG tablet    levothyroxine (SYNTHROID) 125 MCG tablet Take 125 mcg by mouth daily before breakfast.   metFORMIN (GLUCOPHAGE-XR) 500 MG 24 hr tablet Take 500 mg by mouth 3 (three) times daily. 151m total daily   multivitamin-iron-minerals-folic acid (CENTRUM) chewable tablet Chew 1 tablet by mouth daily.   pantoprazole (PROTONIX) 40 MG tablet TAKE 1 TABLET BY MOUTH EVERY DAY   pregabalin (LYRICA) 150 MG capsule Take 1 capsule (150 mg total) by mouth 2 (two) times daily. (Patient taking differently: Take 150 mg by mouth in the morning, at noon, and at bedtime.)   budesonide-formoterol (SYMBICORT) 160-4.5 MCG/ACT inhaler Inhale 2 puffs into the lungs 2 (two) times daily.   Fluticasone-Umeclidin-Vilant (TRELEGY ELLIPTA) 200-62.5-25 MCG/INH AEPB Inhale 1 puff into the lungs daily.   mycophenolate (CELLCEPT) 500 MG tablet Take 1  tablet (500 mg total) by mouth 2 (two) times daily. (Patient not taking: Reported on 10/04/2021)   predniSONE (DELTASONE) 20 MG tablet TAKE 2 TABLETS BY MOUTH DAILY   sulfamethoxazole-trimethoprim (BACTRIM DS) 800-160 MG tablet Take 1 tablet by mouth 3 (three) times a week. (Patient not taking: Reported on 06/28/2021)   No facility-administered encounter medications on file as of 10/04/2021.   Physical Exam: Gen:      No acute distress Neuro: alert and oriented x 3 Psych: normal mood and affect   Data Reviewed: Imaging: CTA 06/15/2019-no pulmonary embolism, mild diffuse interstitial prominence with bilateral groundglass and nodular airspace opacities.    High-res CT 07/11/2019-Bilateral fine nodularity with groundglass with no significant air trapping.  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  High-res CT 11/02/2019-subpleural reticulation with groundglass, traction bronchiectasis no basal gradient.  Indeterminate for UIP.  High-res CT 05/09/2020-stable interstitial lung disease  High-res CT 11/02/2020-minimal progression of fibrotic lung disease with groundglass opacities  High-res CT 01/08/2021-stable ILD compared to March 2022  High-res CT 07/04/2021-significantly increased groundglass opacities with progression of fibrotic changes.  I reviewed the images personally.  [facility] 2.53 [68%], FEV1 2.26 [75%], F/F 89,  SVC 1.75 [50%], DLCO 14.61 [65%] Moderate restriction, diffusion defect.  11/16/2020 FVC 2.14 [5%), FEV1 1.95 (62%], F/F 91, TLC 2.24 (41%), DLCO 16.02 (69%] Severe restriction, mild diffusion defect.  01/17/2021 FVC 2.38 (61%), FEV1 2.12 (68%), F/F 89, TLC 3.86 [72%), DLCO 14.60 (63%) Severe restriction, mild diffusion defect  6-minute walk 12/27/2020- 61 m  Labs: ANA 03/25/2019-negative,  Rheumatoid factor 03/25/2019- < 14 Repeat CTD serologies 07/18/2019-negative, hypersensitivity panel-negative  CBC 06/15/2019-WBC 5.87, eos 6.4%, absolute eosinophil  count 811 Metabolic panel 91/47/8295-[id number] normal limits D-dimer 06/30/2019-0.41 BNP 06/30/2019-10.   Bronchoscopy 07/12/2019 WBC 510, lymphocytes 38%, eos 12%, Microbiology-negative Cytology -no malignancy Transbronchial biopsy-nonnecrotizing epithelioid granulomas SARS-CoV-2, viral PCR-negative  Surgical lung biopsy 12/12/2019- features suggestive of chronic hypersensitivity pneumonitis with heterogeneous fibrosis and superimposed subacute lung injury.  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  Surgical lung biopsy discussed at our conference and at with no abnormalities agree with diagnosis of chronic hypersensitivity pneumonitis, post Covid ILD  She has been stable for some time, off steroids but now has increasing chest tightness, dyspnea. Repeat CT reviewed with worsening ILD, pulmonary fibrosis with groundglass  It is unclear to me as to why she is worsening now.  She has not responded to multiple rounds of prednisone including prolonged taper earlier this year. There is low suspicion for any infectious etiology. Given worsening changes on CT and by symptoms we have decided to start immunosuppression since prednisone is not helping and she has side effects to steroids.  She  eventually may need to be started on Ofev down the line.  Has not tolerated CellCept Currently on azathioprine 50 mg and is tolerating it well.  Will increase dose to 100 mg Recheck labs for monitoring  Plan/Recommendations: - Increase azathioprine to 100 mg/day - Check labs  I discussed the assessment and treatment plan with the patient. The patient was provided an opportunity to ask questions and all were answered. The patient agreed with the plan and demonstrated an understanding of the instructions.   The patient was advised to call back or seek an in-person evaluation if the symptoms worsen or if the condition fails to improve as anticipated.  I provided 35 minutes of non-face-to-face time during this encounter.  Marshell Garfinkel MD [facility] 10/04/2021, 9:59 AM  CC: Sueanne Margarita, DO

## 2021-10-08 ENCOUNTER — Encounter: Payer: Self-pay | Admitting: Pulmonary Disease

## 2021-10-10 ENCOUNTER — Ambulatory Visit: Payer: 59

## 2021-10-10 ENCOUNTER — Ambulatory Visit (INDEPENDENT_AMBULATORY_CARE_PROVIDER_SITE_OTHER): Payer: 59

## 2021-10-10 ENCOUNTER — Telehealth: Payer: Self-pay | Admitting: Pulmonary Disease

## 2021-10-10 DIAGNOSIS — R058 Other specified cough: Secondary | ICD-10-CM

## 2021-10-10 DIAGNOSIS — J849 Interstitial pulmonary disease, unspecified: Secondary | ICD-10-CM

## 2021-10-10 MED ORDER — AZATHIOPRINE 50 MG PO TABS
75.0000 mg | ORAL_TABLET | Freq: Every day | ORAL | 2 refills | Status: DC
Start: 2021-10-10 — End: 2021-11-01

## 2021-10-10 NOTE — Telephone Encounter (Signed)
Lm x1 for patient.  

## 2021-10-10 NOTE — Telephone Encounter (Signed)
I apologize for the confusion.  25 mg/day of Imuran was the correct initial dose  My instructions should have been increased from 25 to 75 mg per day at last visit.  We will send in the new corrected prescription  If she is having cough then we should wait until it has subsided before increasing the Imuran dose. I would advise to get an x ray.  We can send in a prescription for prednisone 40 mg a day for 5 days if the cough persists tomorrow.

## 2021-10-10 NOTE — Telephone Encounter (Signed)
Called and spoke to pt. Pt states the script for Imuran 100mg  (take qd) was sent to pharmacy. However, pt remembers Dr. saying to increase to 100mg  "from 50mg ". Pt states she has only been taking 25 mg as that was the last scipt sent to pharmacy. Per lab results note from 08/28/21 25mg  Imuran was prescribed. Pt is concerned if she should go up 75mg  in strength, if it is safe.   Pt also states yesterday she had a constant dry cough. Pt states she could not talk without coughing. Pt states she took 5 puffs of albuterol hfa between the hours of 12 noon and 3pm. Pt then had an albuterol neb tx at 4pm. Pt complains today of chest pressure, wheezing, dry cough. Pt is requesting recs. Pt unsure if she needs pred or just wait and see how she feels tomorrow.    Dr. , please advise if ok for pt to increase Imuran from 25mg  to 100mg  AND on acute s/s. Thank you!

## 2021-10-10 NOTE — Telephone Encounter (Signed)
Patient is aware of recommendations and voiced her understanding.  C/o dry cough at times prod with clear sputum. She would like CXR.  CXR order. Imuran 75mg  sent to preferred pharmacy.  She will call back tomorrow for prednisone if cough does not improve.  Nothing further needed at this time.

## 2021-10-11 ENCOUNTER — Encounter: Payer: Self-pay | Admitting: Pulmonary Disease

## 2021-10-11 ENCOUNTER — Telehealth: Payer: Self-pay | Admitting: Pulmonary Disease

## 2021-10-11 DIAGNOSIS — J849 Interstitial pulmonary disease, unspecified: Secondary | ICD-10-CM

## 2021-10-11 MED ORDER — LEVOFLOXACIN 750 MG PO TABS: 750.0000 mg | ORAL_TABLET | Freq: Every day | ORAL | 0 refills | Status: AC

## 2021-10-11 MED ORDER — PREDNISONE 10 MG PO TABS: 40.0000 mg | ORAL_TABLET | Freq: Every day | ORAL | 0 refills | Status: DC

## 2021-10-11 NOTE — Telephone Encounter (Signed)
HRCT order placed for next 1-2 weeks.  Nothing further at this time.

## 2021-10-11 NOTE — Telephone Encounter (Signed)
I reviewed the x ray with increased bibasal infiltrates. I called and discussed with Shelly and have called in levofloxacin for 7 days and prednisone 40mg /day for 7 days Can you please order CT scan high res in the next 1-2 weeks for follow up

## 2021-10-11 NOTE — Telephone Encounter (Signed)
See 10/11/21 telephone encounter.

## 2021-10-15 ENCOUNTER — Encounter: Payer: Self-pay | Admitting: Pulmonary Disease

## 2021-10-16 MED ORDER — HYDROCODONE BIT-HOMATROP MBR 5-1.5 MG/5ML PO SOLN: 5.0000 mL | Freq: Four times a day (QID) | ORAL | 0 refills | Status: DC | PRN

## 2021-10-16 NOTE — Telephone Encounter (Signed)
I have sent in a prescription for Hycodan to her pharmacy I am also concerned that she is not getting better and coughing is worse.    Agree with flu test She can come to our clinic if her PCP office is closed for chest x-ray and flu test.  She can either see me or APP

## 2021-10-16 NOTE — Telephone Encounter (Signed)
Message routed to Dr.Mannam to advise-  Dont think its the flu & unfortunately my PCP office has flooded & not open for me to come in/ go there.    1- Id like to know about cough syrup 2- something to also allow me to sleep. Ive not slept in 2 days.    Pharmacy is: [facility] [address] [facility] #[phone]   Thanks [name]

## 2021-10-17 ENCOUNTER — Encounter: Payer: Self-pay | Admitting: Nurse Practitioner

## 2021-10-17 ENCOUNTER — Ambulatory Visit (INDEPENDENT_AMBULATORY_CARE_PROVIDER_SITE_OTHER)
Admission: RE | Admit: 2021-10-17 | Discharge: 2021-10-17 | Disposition: A | Discharge status: Home / Self Care | Payer: 59 | Source: Ambulatory Visit | Attending: Nurse Practitioner | Admitting: Nurse Practitioner

## 2021-10-17 ENCOUNTER — Other Ambulatory Visit: Payer: Self-pay

## 2021-10-17 ENCOUNTER — Ambulatory Visit: Payer: 59 | Admitting: Nurse Practitioner

## 2021-10-17 VITALS — BP 112/76 | HR 110 | Temp 98.1°F | Ht 66.0 in | Wt 173.0 lb

## 2021-10-17 DIAGNOSIS — R058 Other specified cough: Secondary | ICD-10-CM | POA: Insufficient documentation

## 2021-10-17 DIAGNOSIS — R0789 Other chest pain: Secondary | ICD-10-CM | POA: Diagnosis not present

## 2021-10-17 DIAGNOSIS — R059 Cough, unspecified: Secondary | ICD-10-CM | POA: Diagnosis not present

## 2021-10-17 DIAGNOSIS — J849 Interstitial pulmonary disease, unspecified: Secondary | ICD-10-CM

## 2021-10-17 DIAGNOSIS — R0602 Shortness of breath: Secondary | ICD-10-CM

## 2021-10-17 DIAGNOSIS — J189 Pneumonia, unspecified organism: Secondary | ICD-10-CM

## 2021-10-17 MED ORDER — PREDNISONE 10 MG PO TABS: ORAL_TABLET | ORAL | 0 refills | Status: DC

## 2021-10-17 MED ORDER — FLUTICASONE PROPIONATE 50 MCG/ACT NA SUSP: 2.0000 | Freq: Every day | NASAL | 2 refills | Status: DC

## 2021-10-17 MED ORDER — IOHEXOL 300 MG/ML  SOLN
80.0000 mL | Freq: Once | INTRAMUSCULAR | Status: AC | PRN
  Administered 2021-10-17: 80 mL via INTRAVENOUS

## 2021-10-17 NOTE — Assessment & Plan Note (Signed)
Recent CXR with increased bibasilar opacities, concerning for possible superimposed pneumonia vs progression of ILD. Tx with 7 day course of levaquin and prednisone. Has had minimal improvement in symptoms aside from cough is no longer producing clear sputum. CTA today will further evaluate underlying lung disease and assess for evidence of persistent pna but low suspicion for this.

## 2021-10-17 NOTE — Assessment & Plan Note (Signed)
Evidence of postnasal drip on exam. Hx of allergic rhinitis. Started on OTC allergy medicine yesterday. Will add flonase for postnasal drainage control.

## 2021-10-17 NOTE — Progress Notes (Signed)
Discussed CTA findings with patient. No evidence of PE. Ground-glass densities are faint in left lower lung fields, possibly consistent with scarring or interstitial pneumonia. No worse on exam. 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 No evidence of superimposed infection. Will treat with extended prednisone taper and postnasal drainage control regimen. Advised to notify if symptoms do not improve or worsen. Verbalized understanding.

## 2021-10-17 NOTE — Addendum Note (Signed)
Addended by: Clayton Bibles on: 10/17/2021 04:35 PM   Modules accepted: Orders

## 2021-10-17 NOTE — Patient Instructions (Addendum)
Continue albuterol inhaler 2 puffs or duoneb 3 mL neb every 6 hours as needed for shortness of breath or wheezing. Notify if symptoms persist despite rescue inhaler/neb use. -Continue Breztri 2 puffs Twice daily. Brush tongue and rinse mouth afterwards -Continue over the counter allergy medicine daily -Continue Hycodan 5 mLs every 6 hours as needed for cough -Continue Imuran at 25 mg daily. We will discuss increasing your dose once  your acute symptoms have resolved.  -Continue protonix 40 mg daily  -Start flonase nasal spray 2 sprays each nostril daily  -Saline nasal irrigations 1-2 times per day  STAT CTA chest to assess for blood clot. We will discuss next steps after this is completed.   Follow up in 1 week with Dr. Vaughan Browner or Alanson Aly. If symptoms do not improve or worsen, please contact office for sooner follow up or seek emergency care.

## 2021-10-17 NOTE — Assessment & Plan Note (Signed)
Non-responsive to current steroid regimen. Concern for possible PE. HRCT was scheduled for Monday; however, we will obtain CTA with PE protocol today. She had PFTs in May without evidence of worsening in her restrictive disease. Recently started on Imuran; plans to increase to 75 mg daily; however, held this increase d/t acute symptoms. Tolerating current dose well. Continue Breztri and PRN albuterol/duonebs.

## 2021-10-17 NOTE — Progress Notes (Signed)
_0  ID: [name], female    DOB: Nov 05, 1975, 46 y.o.   MRN: [id number]  Chief Complaint  Patient presents with   Follow-up    She was told that she has pneumonia. She has been taking ABT since Friday and was told she needed to be tested to flu.     Referring provider: Sueanne Margarita, DO  HPI: 46 year old female, never smoker followed for ILD with hypersensitivity pneumonitis, post COVID ILD with long-haul syndrome. She is a patient of Dr. Vaughan Browner and last seen via virtual visit on 10/04/2021. Past medical history significant for allergic rhinitis, hypothyroidism, PCO, depression with anxiety, fibromyalgia, IBS.  Initially started complaining of increasing dyspnea with chest pressure in mid August 2020. CTA was obtained by her PCP without evidence of embolism but showed b/l diffuse pulmonary infiltrates. COVID 19 was negative. 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 Per the patient, she had an unspecified autoimmune disease and was referred to Dr. Trudie Reed, rheumatology, with negative work up.   She underwent bronchoscopy on 07/12/2019 with findings of lymphocytosis on BAL and granulomas. Her case was discussed at multidisciplinary conference on 08/02/2019 with diagnosis of hypersensitivity pneumonitis d/t down exposure and started on prednisone 40 mg daily with slow taper to be off by early Jan 2021. She contracted COVID 09/07/2019, with worsening symptoms, and received monoclonal antibody therapy. She was restarted on prednisone post COVID and tried to taper off; however, she was noted to have persistent symptoms with evolving ILD on CT chest. Prednisone was increased back to 40 mg daily with biopsy on 12/12/2019.   Completed pulmonary rehab end of 2021 and eventually tapered off steroids in 2021.   Has followed with Dr. 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   Presented in March 2022 with worsening dyspnea, chest tightness. Trialed prednisone 40 mg daily for 1 month to evaluated for any improvement. No change in 6 minute walk, CT, and PFTs. Overall, felt as though prednisone was not helping and tapered off.   TEST/EVENTS:  Bronchoscopy 07/12/2019 WBC 510, lymphocytes 38%, eos 12%, Microbiology-negative Cytology -no malignancy Transbronchial biopsy-nonnecrotizing epithelioid granulomas SARS-CoV-2, viral PCR-negative   Surgical lung biopsy 12/12/2019- features suggestive of chronic hypersensitivity pneumonitis with heterogeneous fibrosis and superimposed subacute lung injury.  01/17/2021 PFTs: FVC 2.38 (61), FEV1 2.12 (68), ratio 89, TLC 72%, DLCOunc 63% 07/05/2021 HRCT chest: redemonstrated moderate IPF, in apical to basal gradient pattern, with irregular peripheral interstitial opacity, septal thickening, extensive ground-glass, and areas of subpleural bronchiolectasis at lung bases. Fibrotic findings are appreciably worsened when compared to previous exam, with increase in ground glass. S/p right lung wedge resection.   08/28/2021: OV with Dr. Vaughan Browner. Initially improved with prednisone in January 2021; stable for some time off steroids but reported worsening chest tightness, dyspnea. Recent CT with worsening ILD, IPF with groundglass. Discussed immunosuppression therapy; unable to tolerate CellCept d/t Rosacea flare. TPMT levels checked to consider azathioprine. May need Ofev down the line.   10/04/2021: Virtual visit with Dr. Vaughan Browner. Follow up after starting Imuran. Tolerating well- increased to 75 mg/day. CBC with diff and CMET ordered which were stable.  10/17/2021: Today - acute visit Patient presents today for cough and chest congestion. She contacted the office on 2/16 and  notified that she was having a constant dry cough,  chest pressure and wheezing. She was advised to hold off on increasing her Imuran dose and to notify if cough persisted. She contacted a few days later and notified that her cough had become productive with clear sputum and requested chest x ray. CXR was obtained which showed increased bibasal infiltrates, concerning for infectious process. She was started on Levaquin for 7 days and prednisone 40 mg daily for 7 days. HRCT was ordered for 1-2 week follow up. She contacted the office again on 2/21 stating that her cough was worse and she was having body aches and advised to come in for further evaluation.   Today, she reports that her cough has not improved and is slightly worse. It is now non-productive, which was the only improvement with levaquin and prednisone course. She continues to have shortness of breath with exertion and coughing spells. She is having some pleuritic pain and chest tightness. She has been experiencing episodes of dizziness as well and was noted to be slightly tachycardic to 110's in office. She denies any fevers, chest pain, palpitations, wheezing, orthopnea, or PND. She denies any calf pain or redness. She continues on [place] Twice daily and has had minimal relief from albuterol inhaler, which she is using frequently. She did have mild relief with Hycodan cough syrup. She has not noticed any URI symptoms but started taking allergy medicine yesterday as she was concerned her allergies may start to flare with change in weather. She continues on Imuran 25 mg daily.   Allergies  Allergen Reactions   Methimazole Other (See Comments)    Body goes into arthritic shock   Bupropion Other (See Comments)    Body aches / headaches     Immunization History  Administered Date(s) Administered   Influenza Split 05/19/2012   Influenza Whole 06/19/2008   Influenza, Quadrivalent, Recombinant, Inj, Pf 05/15/2020   Influenza,inj,Quad PF,6+ Mos 04/30/2015, 04/12/2016, 06/23/2018, 05/25/2019,  08/28/2021   Influenza,inj,quad, With Preservative 05/15/2020   Influenza-Unspecified 04/27/2015, 04/30/2015, 04/12/2016, 04/25/2016, 06/23/2018, 05/25/2019, 05/15/2020   PFIZER Comirnaty(Gray Top)Covid-19 Tri-Sucrose Vaccine 01/26/2020, 03/08/2020   PFIZER(Purple Top)SARS-COV-2 Vaccination 01/26/2020, 03/08/2020, 05/15/2020   Td 07/13/2009    Past Medical History:  Diagnosis Date   Anxiety    Arthritis    Depression    Diabetes mellitus without complication (HCC)    Dyspnea    Fibromyalgia    GERD (gastroesophageal reflux disease)    Headache    migraines   Hypothyroidism 1999   post PTU Rx for hyperthyroidism   IBS (irritable bowel syndrome)    Interstitial lung disease ([place])    Pneumonia 06/2019   Double PNA    Tobacco History: Social History   Tobacco Use  Smoking Status Never  Smokeless Tobacco Never   Counseling given: Not Answered   Outpatient Medications Prior to Visit  Medication Sig Dispense Refill   acetaminophen (TYLENOL) 650 MG CR tablet Take 650 mg by mouth every 8 (eight) hours as needed for pain.     albuterol (VENTOLIN HFA) 108 (90 Base) MCG/ACT inhaler Inhale into the lungs.     azaTHIOprine (IMURAN) 50 MG tablet Take 1.5 tablets (75 mg total) by mouth daily. 45 tablet 2   Budeson-Glycopyrrol-Formoterol (BREZTRI AEROSPHERE) 160-9-4.8 MCG/ACT AERO Inhale 2 puffs into the lungs in the morning and at bedtime. 10.7 g 6   busPIRone (BUSPAR) 10 MG tablet Take 10 mg by mouth 2 (two) times daily.     doxycycline (DORYX) 100  MG EC tablet Take 100 mg by mouth daily.     DULoxetine (CYMBALTA) 60 MG capsule TAKE 1 CAPSULE BY MOUTH EVERY DAY (Patient taking differently: Take 60 mg by mouth daily.) 90 capsule 2   ferrous sulfate 324 MG TBEC Take 324 mg by mouth.     HYDROcodone bit-homatropine (HYCODAN) 5-1.5 MG/5ML syrup Take 5 mLs by mouth every 6 (six) hours as needed for cough. 240 mL 0   ipratropium-albuterol (DUONEB) 0.5-2.5 (3) MG/3ML SOLN TAKE 3 MLS BY  NEBULIZATION EVERY 6 (SIX) HOURS AS NEEDED. 180 mL 1   JANUVIA 100 MG tablet      levofloxacin (LEVAQUIN) 750 MG tablet Take 1 tablet (750 mg total) by mouth daily for 7 days. 7 tablet 0   levothyroxine (SYNTHROID) 125 MCG tablet Take 125 mcg by mouth daily before breakfast.     metFORMIN (GLUCOPHAGE-XR) 500 MG 24 hr tablet Take 500 mg by mouth 3 (three) times daily. 1524m total daily     multivitamin-iron-minerals-folic acid (CENTRUM) chewable tablet Chew 1 tablet by mouth daily.     mycophenolate (CELLCEPT) 500 MG tablet Take 1 tablet (500 mg total) by mouth 2 (two) times daily. 30 tablet 5   pantoprazole (PROTONIX) 40 MG tablet TAKE 1 TABLET BY MOUTH EVERY DAY 90 tablet 1   predniSONE (DELTASONE) 10 MG tablet Take 4 tablets (40 mg total) by mouth daily with breakfast for 7 doses. 28 tablet 0   pregabalin (LYRICA) 150 MG capsule Take 1 capsule (150 mg total) by mouth 2 (two) times daily. (Patient taking differently: Take 150 mg by mouth in the morning, at noon, and at bedtime.) 60 capsule 3   budesonide-formoterol (SYMBICORT) 160-4.5 MCG/ACT inhaler Inhale 2 puffs into the lungs 2 (two) times daily.     Fluticasone-Umeclidin-Vilant (TRELEGY ELLIPTA) 200-62.5-25 MCG/INH AEPB Inhale 1 puff into the lungs daily. 60 each 5   sulfamethoxazole-trimethoprim (BACTRIM DS) 800-160 MG tablet Take 1 tablet by mouth 3 (three) times a week. (Patient not taking: Reported on 06/28/2021) 12 tablet 5   No facility-administered medications prior to visit.     Review of Systems:   Constitutional: No weight loss or gain, night sweats, fevers, chills. +fatigue HEENT: No headaches, difficulty swallowing, tooth/dental problems, or sore throat. No sneezing, itching, ear ache, nasal congestion, or post nasal drip CV:  No chest pain, orthopnea, PND, swelling in lower extremities, anasarca, palpitations, syncope Resp: +shortness of breath with exertion; persistent, dry cough; pleuritic pain; chest tightness. No excess  mucus or change in color of mucus. No hemoptysis. No wheezing.  No chest wall deformity GI:  No heartburn, indigestion, abdominal pain, nausea, vomiting, diarrhea, change in bowel habits, loss of appetite, bloody stools.  GU: No dysuria, change in color of urine, urgency or frequency.  No flank pain, no hematuria  Skin: No rash, lesions, ulcerations MSK:  No joint pain or swelling.  No decreased range of motion.  No back pain. Neuro: +dizziness, lightheadedness. No cog changes or balance issues Psych: No depression or anxiety. Mood stable.     Physical Exam:  BP 112/76 (BP Location: Left Arm, Patient Position: Sitting, Cuff Size: Normal)    Pulse (!) 110    Temp 98.1 F (36.7 C) (Oral)    Ht _0  (1.676 m)    Wt 173 lb (78.5 kg)    SpO2 94%    BMI 27.92 kg/m   GEN: Pleasant, interactive, well-appearing; in no acute distress. HEENT:  Normocephalic and atraumatic. EACs patent bilaterally. TM  pearly gray with present light reflex bilaterally. PERRLA. Sclera white. Nasal turbinates pink, moist and patent bilaterally. No rhinorrhea present. Oropharynx erythematous and moist, without exudate or edema. No lesions, ulcerations NECK:  Supple w/ fair ROM. No JVD present. Normal carotid impulses w/o bruits. Thyroid symmetrical with no goiter or nodules palpated. No lymphadenopathy.   CV: Tachycardic, regular rhythm, no m/r/g, no peripheral edema. Pulses intact, +2 bilaterally. No cyanosis, pallor or clubbing. PULMONARY:  Unlabored, regular breathing. Minimal scattered rhonchi bilaterally posteriorly . No accessory muscle use. No dullness to percussion. GI: BS present and normoactive. Soft, non-tender to palpation. No organomegaly or masses detected. No CVA tenderness. MSK: No erythema, warmth or tenderness. Cap refil <2 sec all extrem. No deformities or joint swelling noted.  Neuro: A/Ox3. No focal deficits noted.   Skin: Warm, no lesions or rashe Psych: Normal affect and behavior. Judgement and  thought content appropriate.     Lab Results:  CBC    Component Value Date/Time   WBC 6.4 10/04/2021 1240   RBC 4.51 10/04/2021 1240   HGB 13.6 10/04/2021 1240   HCT 41.1 10/04/2021 1240   PLT 266.0 10/04/2021 1240   MCV 91.3 10/04/2021 1240   MCH 28.6 12/13/2019 0252   MCHC 33.0 10/04/2021 1240   RDW 13.3 10/04/2021 1240   LYMPHSABS 1.3 10/04/2021 1240   MONOABS 0.7 10/04/2021 1240   EOSABS 0.2 10/04/2021 1240   BASOSABS 0.0 10/04/2021 1240    BMET    Component Value Date/Time   NA 139 10/04/2021 1240   K 4.0 10/04/2021 1240   CL 100 10/04/2021 1240   CO2 32 10/04/2021 1240   GLUCOSE 92 10/04/2021 1240   BUN 14 10/04/2021 1240   CREATININE 0.68 10/04/2021 1240   CREATININE 0.75 03/25/2019 1610   CALCIUM 9.7 10/04/2021 1240   GFRNONAA >60 12/13/2019 0252   GFRAA >60 12/13/2019 0252    BNP No results found for: BNP   Imaging:  DG Chest 2 View  Result Date: 10/11/2021 CLINICAL DATA:  Cough. EXAM: CHEST - 2 VIEW COMPARISON:  Most recent radiograph 10/26/2020, chest CT 07/04/2021 FINDINGS: Reticular changes in a peripheral and basilar predominant distribution, with progression from March radiograph. There is particular increase in ill-defined bibasilar opacities. No confluent consolidation. Stable heart size and mediastinal contours. No pleural effusion or pneumothorax. Scoliotic curvature of the spine. IMPRESSION: Progressive reticular changes, patient with known interstitial lung disease. There is particular increase in ill-defined bibasilar opacities. This may represent progression in interstitial lung disease versus superimposed atelectasis or pneumonia. Electronically Signed   By: Keith Rake M.D.   On: 10/11/2021 13:35      PFT Results Latest Ref Rng & Units 01/17/2021 11/16/2020 04/02/2020  FVC-Pre L 2.31 2.15 2.78  FVC-Predicted Pre % 59 55 71  FVC-Post L 2.38 2.14 -  FVC-Predicted Post % 61 55 -  Pre FEV1/FVC % % 89 90 88  Post FEV1/FCV % % 89 91 -   FEV1-Pre L 2.06 1.95 2.45  FEV1-Predicted Pre % 66 62 78  FEV1-Post L 2.12 1.95 -  DLCO uncorrected ml/min/mmHg 14.60 16.02 17.38  DLCO UNC% % 63 69 75  DLCO corrected ml/min/mmHg 14.60 16.02 17.38  DLCO COR %Predicted % 63 69 75  DLVA Predicted % 103 112 105  TLC L 3.86 2.24 -  TLC % Predicted % 72 41 -  RV % Predicted % 80 -1 -    No results found for: NITRICOXIDE      Assessment & Plan:  Cough Persistent cough; non-productive after levaquin 7 day course which was her only notable improvement. She is newly tachycardic in office. Due to this, her dizziness, pleuritic pain, SOB, and no identifiable underlying cause with minimal to no improvement after abx and prednisone, STAT CTA with PE protocol to r/o blood clot. If there is no evidence of PE, contributing factors could be progressive ILD or upper airway syndrome. We will optimize postnasal drainage control. She is already on PPI therapy. May consider addition prednisone with slow taper.   Patient Instructions  Continue albuterol inhaler 2 puffs or duoneb 3 mL neb every 6 hours as needed for shortness of breath or wheezing. Notify if symptoms persist despite rescue inhaler/neb use. -Continue Breztri 2 puffs Twice daily. Brush tongue and rinse mouth afterwards -Continue over the counter allergy medicine daily -Continue Hycodan 5 mLs every 6 hours as needed for cough -Continue Imuran at 25 mg daily. We will discuss increasing your dose once  your acute symptoms have resolved.  -Continue protonix 40 mg daily  -Start flonase nasal spray 2 sprays each nostril daily  -Saline nasal irrigations 1-2 times per day  STAT CTA chest to assess for blood clot. We will discuss next steps after this is completed.   Follow up in 1 week with Dr. Vaughan Browner or Alanson Aly. If symptoms do not improve or worsen, please contact office for sooner follow up or seek emergency care.    ILD (interstitial lung disease) ([place]) Non-responsive to current  steroid regimen. Concern for possible PE. HRCT was scheduled for Monday; however, we will obtain CTA with PE protocol today. She had PFTs in May without evidence of worsening in her restrictive disease. Recently started on Imuran; plans to increase to 75 mg daily; however, held this increase d/t acute symptoms. Tolerating current dose well. Continue Breztri and PRN albuterol/duonebs.   Upper airway cough syndrome Evidence of postnasal drip on exam. Hx of allergic rhinitis. Started on OTC allergy medicine yesterday. Will add flonase for postnasal drainage control.   CAP (community acquired pneumonia) Recent CXR with increased bibasilar opacities, concerning for possible superimposed pneumonia vs progression of ILD. Tx with 7 day course of levaquin and prednisone. Has had minimal improvement in symptoms aside from cough is no longer producing clear sputum. CTA today will further evaluate underlying lung disease and assess for evidence of persistent pna but low suspicion for this.   I spent 48 of dedicated to the care of this patient on the date of this encounter to include pre-visit review of records, face-to-face time with the patient discussing conditions above, post visit ordering of testing, clinical documentation with the electronic health record, making appropriate referrals as documented, and communicating necessary findings to members of the patients care team.  Clayton Bibles, NP 10/17/2021  Pt aware and understands NP's role.

## 2021-10-17 NOTE — Assessment & Plan Note (Signed)
Persistent cough; non-productive after levaquin 7 day course which was her only notable improvement. She is newly tachycardic in office. Due to this, her dizziness, pleuritic pain, SOB, and no identifiable underlying cause with minimal to no improvement after abx and prednisone, STAT CTA with PE protocol to r/o blood clot. If there is no evidence of PE, contributing factors could be progressive ILD or upper airway syndrome. We will optimize postnasal drainage control. She is already on PPI therapy. May consider addition prednisone with slow taper.   Patient Instructions  Continue albuterol inhaler 2 puffs or duoneb 3 mL neb every 6 hours as needed for shortness of breath or wheezing. Notify if symptoms persist despite rescue inhaler/neb use. -Continue Breztri 2 puffs Twice daily. Brush tongue and rinse mouth afterwards -Continue over the counter allergy medicine daily -Continue Hycodan 5 mLs every 6 hours as needed for cough -Continue Imuran at 25 mg daily. We will discuss increasing your dose once  your acute symptoms have resolved.  -Continue protonix 40 mg daily  -Start flonase nasal spray 2 sprays each nostril daily  -Saline nasal irrigations 1-2 times per day  STAT CTA chest to assess for blood clot. We will discuss next steps after this is completed.   Follow up in 1 week with Dr. Isaiah Carter or [name]. If symptoms do not improve or worsen, please contact office for sooner follow up or seek emergency care.

## 2021-10-21 ENCOUNTER — Ambulatory Visit (HOSPITAL_BASED_OUTPATIENT_CLINIC_OR_DEPARTMENT_OTHER): Payer: 59

## 2021-10-24 ENCOUNTER — Telehealth (INDEPENDENT_AMBULATORY_CARE_PROVIDER_SITE_OTHER): Payer: 59 | Admitting: Nurse Practitioner

## 2021-10-24 ENCOUNTER — Encounter: Payer: Self-pay | Admitting: Nurse Practitioner

## 2021-10-24 VITALS — BP 123/75 | Ht 65.0 in | Wt 170.0 lb

## 2021-10-24 DIAGNOSIS — J849 Interstitial pulmonary disease, unspecified: Secondary | ICD-10-CM | POA: Diagnosis not present

## 2021-10-24 DIAGNOSIS — R058 Other specified cough: Secondary | ICD-10-CM | POA: Diagnosis not present

## 2021-10-24 DIAGNOSIS — J301 Allergic rhinitis due to pollen: Secondary | ICD-10-CM | POA: Diagnosis not present

## 2021-10-24 DIAGNOSIS — J189 Pneumonia, unspecified organism: Secondary | ICD-10-CM

## 2021-10-24 MED ORDER — BENZONATATE 200 MG PO CAPS: 200.0000 mg | ORAL_CAPSULE | Freq: Three times a day (TID) | ORAL | 1 refills | Status: DC | PRN

## 2021-10-24 NOTE — Assessment & Plan Note (Signed)
Slowly improving symptoms. Continue cough and postnasal drainage control measures. Currently on PPI with well-controlled GERD. Advise she use tessalon perles throughout the day to help with cough. Currently on extended prednisone taper; advised to notify if worsening symptoms develop as she decreases her dose. ? ?Patient Instructions  ?Continue albuterol inhaler 2 puffs or duoneb 3 mL neb every 6 hours as needed for shortness of breath or wheezing. Notify if symptoms persist despite rescue inhaler/neb use. ?-Continue Breztri 2 puffs Twice daily. Brush tongue and rinse mouth afterwards. Continue to use with spacer ?-Continue over the counter allergy medicine daily ?-Continue Hycodan 5 mLs every 6 hours as needed for cough ?-Continue Imuran at 25 mg daily. We will discuss increasing your dose once  your acute symptoms have resolved.  ?-Continue protonix 40 mg daily ?-Continue flonase nasal spray 2 sprays each nostril daily  ?-Continue saline nasal irrigations 1-2 times per day ?-Continue prednisone taper as previously  ? ?-Tessalon Perles 1 capsule every 8 hours for cough ?-Frequent sips of water; sugar free hard candies or cough drops  ?  ?Follow up in 1 month with Dr. Vaughan Browner. If symptoms do not improve or worsen, please contact office for sooner follow up or seek emergency care ? ? ?

## 2021-10-24 NOTE — Progress Notes (Signed)
Patient ID: [name], female     DOB: [birth year], 46 y.o.      MRN: [id number]  Chief Complaint  Patient presents with   Pneumonia    Pt  had bronchitis and pne pt is doing better since last visit 7 days     Virtual Visit via Video Note  I connected with [name] on 10/24/21 at 10:00 AM EST by a video enabled telemedicine application and verified that I am speaking with the correct person using two identifiers.  Location: Patient: Home Provider: Office   I discussed the limitations of evaluation and management by telemedicine and the availability of in person appointments. The patient expressed understanding and agreed to proceed.  History of Present Illness: 46 year old female, never smoker followed for ILD with hypersensitivity pneumonitis, post-COVID ILD with long-haul syndrome.  She is a patient of Dr. Matilde Carter and last seen via virtual visit on 10/04/2021.  Past medical history significant for allergic rhinitis, hypothyroidism, PCO, depression with anxiety, fibromyalgia, IBS.  Initially started complaining of increasing dyspnea with chest pressure in mid August 2020.  CTA was obtained by her PCP without evidence of embolism but showed bilateral diffuse pulmonary infiltrates.  COVID-19 was negative.  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  Per the patient, she had an unspecified autoimmune disease and was referred to Dr. Luan Carter, rheumatology, with negative work-up.  She underwent bronchoscopy on 07/12/2019 with findings of lymphocytosis on BAL and granulomas.  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  She contracted COVID 09/07/2019, with worsening symptoms, and received monoclonal antibody therapy.  She was  restarted on prednisone post COVID and tried to taper off; however she was noted to have persistent symptoms with evolving ILD on CT chest.  Prednisone was increased back to 40 mg daily with biopsy on 12/12/2019.  Completed pulmonary rehab end of 2021 and eventually tapered off steroids in 2021.  Has had follow-up with Dr. 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  Presented in March 2022 with worsening dyspnea, chest tightness.  Trialed prednisone 40 mg daily for 1 month to evaluate for any improvement.  No change in 6-minute walk, CT and PFTs.  Overall felt as though prednisone was not helping and tapered off.  08/28/2021: OV with Dr. Vaughan Carter.  Initially improved with prednisone in January 2021; stable for some time off steroids but reported worsening chest tightness, dyspnea.  Recent CT with worsening ILD, IPF with groundglass.  Discussed immunosuppression therapy; unable to tolerate CellCept due to rosacea flare.  TPMT levels checked to consider azathioprine.  May need Ofev down the line.  10/04/2021: Virtual visit with Dr. Vaughan Carter.  Follow-up after starting Imuran.  Tolerating well-increase to 75 mg/day.  CBC with differential and CMET ordered which are stable.  10/17/2021: OV with Crystal Brach NP.  Persistent cough and chest congestion since 2/16.  Previous CXR showed increased bibasal infiltrates, and concerning for infectious process.  She was started on Levaquin 7 day course and prednisone 40 mg daily for 7 days.  HRCT was ordered for 1 to 2 weeks for follow-up.  Instructed to  hold off on increasing dose of Imuran.  Again contacted office on 2/21 stating that her cough was worse and she was having body aches and advised to come in for further evaluation.  At [place], reported that her cough had not improved and was slightly worse after  completing Levaquin.  She did note that it was primarily nonproductive which was the only improvement.  She continued to have shortness of breath with exertion and coughing spells.  Experiencing some pleuritic pain and chest tightness.  Also noted episodes of dizziness as well as being slightly tachycardic to 110s in office.  Given her symptoms, CTA with PE protocol was obtained which showed no evidence of PE and without evidence of superimposed infection.  She was treated with extended prednisone taper and postnasal drainage control regimen as well as cough suppression.  Close follow-up.  10/24/2021: Today - follow up Patient presents today via virtual visit for follow up. She reports she is feeling better. Still having an occasional cough, which has remained nonproductive. Her shortness of breath is better; as well as her dizziness. She denies any fevers, chills, or continued pleuritic pain. She continues to have some mild fatigue. She is still on her extended prednisone taper and tolerating it well. She continues Breztri Twice daily and rare use of albuterol. She has not had to use her cough syrup daily but did take some this morning. Her allergy symptoms have improved with antihistamine and flonase.   Allergies  Allergen Reactions   Methimazole Other (See Comments)    Body goes into arthritic shock   Bupropion Other (See Comments)    Body aches / headaches    Immunization History  Administered Date(s) Administered   Influenza Split 05/19/2012   Influenza Whole 06/19/2008   Influenza, Quadrivalent, Recombinant, Inj, Pf 05/15/2020   Influenza,inj,Quad PF,6+ Mos 04/30/2015, 04/12/2016, 06/23/2018, 05/25/2019, 08/28/2021   Influenza,inj,quad, With Preservative 05/15/2020   Influenza-Unspecified 04/27/2015, 04/30/2015, 04/12/2016, 04/25/2016, 06/23/2018, 05/25/2019, 05/15/2020   PFIZER Comirnaty(Gray Top)Covid-19 Tri-Sucrose Vaccine 01/26/2020, 03/08/2020   PFIZER(Purple Top)SARS-COV-2 Vaccination  01/26/2020, 03/08/2020, 05/15/2020   Td 07/13/2009   Past Medical History:  Diagnosis Date   Anxiety    Arthritis    Depression    Diabetes mellitus without complication (HCC)    Dyspnea    Fibromyalgia    GERD (gastroesophageal reflux disease)    Headache    migraines   Hypothyroidism 1999   post PTU Rx for hyperthyroidism   IBS (irritable bowel syndrome)    Interstitial lung disease ([place])    Pneumonia 06/2019   Double PNA    Tobacco History: Social History   Tobacco Use  Smoking Status Never  Smokeless Tobacco Never   Counseling given: Not Answered   Outpatient Medications Prior to Visit  Medication Sig Dispense Refill   acetaminophen (TYLENOL) 650 MG CR tablet Take 650 mg by mouth every 8 (eight) hours as needed for pain.     albuterol (VENTOLIN HFA) 108 (90 Base) MCG/ACT inhaler Inhale into the lungs.     azaTHIOprine (IMURAN) 50 MG tablet Take 1.5 tablets (75 mg total) by mouth daily. 45 tablet 2   Budeson-Glycopyrrol-Formoterol (BREZTRI AEROSPHERE) 160-9-4.8 MCG/ACT AERO Inhale 2 puffs into the lungs in the morning and at bedtime. 10.7 g 6   busPIRone (BUSPAR) 10 MG tablet Take 10 mg by mouth 2 (two) times daily.     doxycycline (DORYX) 100 MG EC tablet Take 100 mg by mouth daily.     DULoxetine (CYMBALTA) 60 MG capsule TAKE  1 CAPSULE BY MOUTH EVERY DAY (Patient taking differently: Take 60 mg by mouth daily.) 90 capsule 2   ferrous sulfate 324 MG TBEC Take 324 mg by mouth.     fluticasone (FLONASE) 50 MCG/ACT nasal spray Place 2 sprays into both nostrils daily. 18.2 mL 2   HYDROcodone bit-homatropine (HYCODAN) 5-1.5 MG/5ML syrup Take 5 mLs by mouth every 6 (six) hours as needed for cough. 240 mL 0   ipratropium-albuterol (DUONEB) 0.5-2.5 (3) MG/3ML SOLN TAKE 3 MLS BY NEBULIZATION EVERY 6 (SIX) HOURS AS NEEDED. 180 mL 1   JANUVIA 100 MG tablet      levothyroxine (SYNTHROID) 125 MCG tablet Take 125 mcg by mouth daily before breakfast.     metFORMIN (GLUCOPHAGE-XR)  500 MG 24 hr tablet Take 500 mg by mouth 3 (three) times daily. 1518m total daily     multivitamin-iron-minerals-folic acid (CENTRUM) chewable tablet Chew 1 tablet by mouth daily.     mycophenolate (CELLCEPT) 500 MG tablet Take 1 tablet (500 mg total) by mouth 2 (two) times daily. 30 tablet 5   pantoprazole (PROTONIX) 40 MG tablet TAKE 1 TABLET BY MOUTH EVERY DAY 90 tablet 1   predniSONE (DELTASONE) 10 MG tablet Take 5 tabs for 3 days, then 4 tabs for 3 days then 3 tabs for 3 days, then 2 tabs for 3 days, then 1 tab for 3 days, then stop 45 tablet 0   pregabalin (LYRICA) 150 MG capsule Take 1 capsule (150 mg total) by mouth 2 (two) times daily. (Patient taking differently: Take 150 mg by mouth in the morning, at noon, and at bedtime.) 60 capsule 3   No facility-administered medications prior to visit.     Review of Systems:   Constitutional: No weight loss or gain, night sweats, fevers, chills. +mild fatigue HEENT: No headaches, difficulty swallowing, tooth/dental problems, or sore throat. No sneezing, itching, ear ache, nasal congestion, or post nasal drip CV:  No chest pain, orthopnea, PND, swelling in lower extremities, anasarca, dizziness, palpitations, syncope Resp: +occasional non-productive cough. No shortness of breath with exertion or at rest. No excess mucus or change in color of mucus. No hemoptysis. No wheezing.  No chest wall deformity GI:  No heartburn, indigestion, abdominal pain, nausea, vomiting, diarrhea, change in bowel habits, loss of appetite, bloody stools.  GU: No dysuria, change in color of urine, urgency or frequency.  No flank pain, no hematuria  Skin: No rash, lesions, ulcerations MSK:  No joint pain or swelling.  No decreased range of motion.  No back pain. Neuro: No dizziness or lightheadedness.  Psych: No depression or anxiety. Mood stable.   Observations/Objective: Patient is well-developed, well-nourished in no acute distress. A&Ox3. Resting comfortably at  home. Unlabored, regular breathing. Speech is clear and coherent with logical content; able to speak in full sentences without difficulties. Occasional dry cough; improved compared to last OV.  10/17/2021 CTA chest: no evidence of PE. Fibrotic changes noted in the periphery of both lungs. Ground-glass densities in left lower lung fields; likely scarring. No pulmonary consolidation or evidence of superimpose infectious process. Reviewed by me  Assessment and Plan: Upper airway cough syndrome Slowly improving symptoms. Continue cough and postnasal drainage control measures. Currently on PPI with well-controlled GERD. Advise she use tessalon perles throughout the day to help with cough. Currently on extended prednisone taper; advised to notify if worsening symptoms develop as she decreases her dose.  Patient Instructions  Continue albuterol inhaler 2 puffs or duoneb 3 mL neb every 6  hours as needed for shortness of breath or wheezing. Notify if symptoms persist despite rescue inhaler/neb use. -Continue Breztri 2 puffs Twice daily. Brush tongue and rinse mouth afterwards. Continue to use with spacer -Continue over the counter allergy medicine daily -Continue Hycodan 5 mLs every 6 hours as needed for cough -Continue Imuran at 25 mg daily. We will discuss increasing your dose once  your acute symptoms have resolved.  -Continue protonix 40 mg daily -Continue flonase nasal spray 2 sprays each nostril daily  -Continue saline nasal irrigations 1-2 times per day -Continue prednisone taper as previously   -Tessalon Perles 1 capsule every 8 hours for cough -Frequent sips of water; sugar free hard candies or cough drops    Follow up in 1 month with Dr. Vaughan Carter. If symptoms do not improve or worsen, please contact office for sooner follow up or seek emergency care    ILD (interstitial lung disease) ([place]) She had PFTs in May without evidence of worsening in her restrictive disease. Recently started on  Imuran; plans to increase to 75 mg daily; however, held this increase d/t acute symptoms. Tolerating current dose well. Continue Breztri and PRN albuterol/duonebs. Will discuss increase to 54m daily at follow up as she is still recovering from possible pna and upper airway cough syndrome.   CAP (community acquired pneumonia) Previously tx with Levaquin course. CTA without evidence of superimposed infectious process. Advised to notify if worsening symptoms including fever and productive cough develop.   Allergic rhinitis Resolution of symptoms with antihistamine regimen and flonase. Will continue for postnasal drainage control.     Follow Up Instructions: Follow up in one month with Dr. MVaughan Carter If symptoms do not improve or worsen, please contact office for sooner follow up or seek emergency care.    I discussed the assessment and treatment plan with the patient. The patient was provided an opportunity to ask questions and all were answered. The patient agreed with the plan and demonstrated an understanding of the instructions.   The patient was advised to call back or seek an in-person evaluation if the symptoms worsen or if the condition fails to improve as anticipated.  I provided 31 minutes of non-face-to-face time during this encounter.   KClayton Bibles NP

## 2021-10-24 NOTE — Assessment & Plan Note (Signed)
Previously tx with Levaquin course. CTA without evidence of superimposed infectious process. Advised to notify if worsening symptoms including fever and productive cough develop.  ?

## 2021-10-24 NOTE — Patient Instructions (Addendum)
Continue albuterol inhaler 2 puffs or duoneb 3 mL neb every 6 hours as needed for shortness of breath or wheezing. Notify if symptoms persist despite rescue inhaler/neb use. ?-Continue Breztri 2 puffs Twice daily. Brush tongue and rinse mouth afterwards. Continue to use with spacer ?-Continue over the counter allergy medicine daily ?-Continue Hycodan 5 mLs every 6 hours as needed for cough ?-Continue Imuran at 25 mg daily. We will discuss increasing your dose once  your acute symptoms have resolved.  ?-Continue protonix 40 mg daily ?-Continue flonase nasal spray 2 sprays each nostril daily  ?-Continue saline nasal irrigations 1-2 times per day ?-Continue prednisone taper as previously  ? ?-Tessalon Perles 1 capsule every 8 hours for cough ?-Frequent sips of water; sugar free hard candies or cough drops  ?  ?Follow up in 1 month with [name]. If symptoms do not improve or worsen, please contact office for sooner follow up or seek emergency care ?

## 2021-10-24 NOTE — Assessment & Plan Note (Signed)
She had PFTs in May without evidence of worsening in her restrictive disease. Recently started on Imuran; plans to increase to 75 mg daily; however, held this increase d/t acute symptoms. Tolerating current dose well. Continue Breztri and PRN albuterol/duonebs. Will discuss increase to 75mg  daily at follow up as she is still recovering from possible pna and upper airway cough syndrome.  ?

## 2021-10-24 NOTE — Assessment & Plan Note (Signed)
Resolution of symptoms with antihistamine regimen and flonase. Will continue for postnasal drainage control.  ?

## 2021-10-28 ENCOUNTER — Encounter: Payer: Self-pay | Admitting: Pulmonary Disease

## 2021-10-28 ENCOUNTER — Telehealth (HOSPITAL_BASED_OUTPATIENT_CLINIC_OR_DEPARTMENT_OTHER): Payer: Self-pay

## 2021-10-28 NOTE — Telephone Encounter (Signed)
[name], please advise if HRCT can be cancelled as she just had CTangio on 2/23 due to acute visit. Thanks.  ?

## 2021-10-29 NOTE — Telephone Encounter (Signed)
Yes. We can cancel the HRCT ?

## 2021-10-29 NOTE — Telephone Encounter (Signed)
[facility], does the pt need to call and cancel her HRCT or do we? [name] says to cancel. Thanks.  ?

## 2021-11-01 ENCOUNTER — Other Ambulatory Visit: Payer: Self-pay | Admitting: Pulmonary Disease

## 2021-11-02 ENCOUNTER — Encounter: Payer: Self-pay | Admitting: Pulmonary Disease

## 2021-11-04 NOTE — Telephone Encounter (Signed)
Dr Isaiah Serge, please advise on pt email, thanks ? ?[facility] ?Phone Number: [phone]  ? ?I continue to struggle daily with health issues from this recent pneumonia. Chest pressure, unproductive cough continues w/ little to no daily tasks in house, headaches & body aches daily.  ?Any time I try to do, even the smallest thing (for example: loading a few dishes in dishwasher), I end up on couch w/ heating pad on chest & taking hydro-- cough syrup to help stop cough. Very exhausted. Talking at length is a challenge. Getting dressed, showering especially (standing & movements are hard as I?m exhausted & can?t stop coughing if I do).  ? ?I believe I will need to remain working remotely for additional time. (Was planning to return this Monday the 13th). Additionally, I remain scared. Scared to get sick again. Scared to overdue it & my body. I still struggle with knowing my limits & knowing when to stop (before it?s too late). I believe it?s best. BUT NEED YOUR RECOMMENDATIONS & HELP WITH THIS DECISION. PLEASE LET ME KNOW MONDAY.  ? ?I WOULD NEED A DR NOTE EXTENDING THIS TIME.  ? ?Thank youChrishelle Zito [phone]  ?

## 2021-11-05 NOTE — Telephone Encounter (Signed)
I am ok to give a doctor note extending the time. We have a clinic visit tomorrow. Do you want to discuss then? ?

## 2021-11-06 ENCOUNTER — Encounter: Payer: Self-pay | Admitting: *Deleted

## 2021-11-06 ENCOUNTER — Other Ambulatory Visit: Payer: Self-pay

## 2021-11-06 ENCOUNTER — Telehealth (INDEPENDENT_AMBULATORY_CARE_PROVIDER_SITE_OTHER): Payer: 59 | Admitting: Pulmonary Disease

## 2021-11-06 ENCOUNTER — Encounter: Payer: Self-pay | Admitting: Pulmonary Disease

## 2021-11-06 VITALS — BP 117/73 | HR 115

## 2021-11-06 DIAGNOSIS — J849 Interstitial pulmonary disease, unspecified: Secondary | ICD-10-CM | POA: Diagnosis not present

## 2021-11-06 NOTE — Progress Notes (Signed)
? ?      [name]    [id number]    16-Jul-1976 ? ?Primary Care Physician:Skakle, Liane Comber, DO ? ?Referring Physician: Sueanne Margarita, DO ?[address] ?[place],  [place] [address] ? ?Virtual Visit via Video Note ? ?I connected with [name] on 10/04/21 at  9:45 AM EST by a video enabled telemedicine application and verified that I am speaking with the correct person using two identifiers. ? ?Location: ?Patient: Home ?Provider: [address] ?  ?I discussed the limitations of evaluation and management by telemedicine and the availability of in person appointments. The patient expressed understanding and agreed to proceed. ? ? ?Chief complaint: Follow-up for interstitial lung disease, hypersensitivity pneumonitis. ? ?HPI: ?46 year old with history of fibromyalgia, depression, anxiety ?Referred for evaluation of abnormal CT scan, ILD ? ?Complains of increasing dyspnea with chest pressure since mid August 2020.  She had a CTA done by her primary care which did not show any embolism but showed bilateral diffuse pulmonary infiltrates.  COVID-19 test was negative.  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 Trudie Reed, rheumatology.  She had a work-up done including labs which were reportedly negative.  CTD serologies and other labs were negative as well. ? ?She underwent bronchoscopy on 07/12/2019 with findings of lymphocytosis on BAL and granulomas. 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  ? ?She was restarted on prednisone post COVID due to worsening dyspnea and tried to  taper again  ?At last visit on 10/26/19 prednisone was down to 5 mg but continued to have persistent symptoms with CT scan showing evolving interstitial lung disease.  Prednisone increased to 40 mg a day and surgical lung biopsy done on 12/12/19 ? ?Finished pulmonary rehab at the end of 2021 which did not help with symptoms ?She was eventually tapered off steroids in 2021 ? ?In the interim she has followed up with Dr. 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  She has enrolled in long-term Covid clinic at [facility] and underwent a cardiac MRI and had a xenon gas lung scan for research purposes ? ?Revaluated in March 2022 with worsening dyspnea, chest tightness. Plan discussed over telephone with her pulmonologist at [facility] who suggested a trial of prednisone 40 mg a day for 1 month to see if there is any improvement.  She completed a trial of prednisone with no change on 6-minute walk, CT and PFTs. Overall she feels that the prednisone has not helped.  She has had weight gain with the prednisone and elevated heart to control blood sugars ? ?Pets: Has dogs.  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  She has a down comforter for many years and got a down pillow in 2019. She got rid of the down after bronchoscopy in late 2020 ?Smoking history: No significant smoking history ?Travel history: Previously lived in [place], [place].  Has been living in [place] for the past 18 years.  No significant recent travel ?Relevant family history: Grandfather  died of lung cancer.  No other significant family history of lung disease. ? ?Interim history:  ?Follow-up CT earlier in late 2022 showed worsening interstitial changes with fibrosis and groundglass opacities.  She continues to be dyspneic.  Given worsening CT findings after discussion with her started CellCept however within a  few weeks she had a flareup of her rosacea and CellCept stopped.   ? ?Started on azathioprine in January 2023.  She is tolerating it well so far. ? ?Seen by nurse practitioner last month for worsening dyspnea with infiltrates.  Given antibiotics.  CTA did not show any pulmonary embolism ? ?Continues to be symptomatic with dyspnea on exertion ? ?Outpatient Encounter Medications as of 11/06/2021  ?Medication Sig  ? acetaminophen (TYLENOL) 650 MG CR tablet Take 650 mg by mouth every 8 (eight) hours as needed for pain.  ? albuterol (VENTOLIN HFA) 108 (90 Base) MCG/ACT inhaler Inhale into the lungs.  ? azaTHIOprine (IMURAN) 50 MG tablet TAKE 1 AND 1/2 TABLETS BY MOUTH DAILY  ? benzonatate (TESSALON) 200 MG capsule Take 1 capsule (200 mg total) by mouth 3 (three) times daily as needed for cough.  ? Budeson-Glycopyrrol-Formoterol (BREZTRI AEROSPHERE) 160-9-4.8 MCG/ACT AERO Inhale 2 puffs into the lungs in the morning and at bedtime.  ? busPIRone (BUSPAR) 10 MG tablet Take 10 mg by mouth 2 (two) times daily.  ? doxycycline (DORYX) 100 MG EC tablet Take 100 mg by mouth daily.  ? DULoxetine (CYMBALTA) 60 MG capsule TAKE 1 CAPSULE BY MOUTH EVERY DAY (Patient taking differently: Take 60 mg by mouth daily.)  ? ferrous sulfate 324 MG TBEC Take 324 mg by mouth.  ? fluticasone (FLONASE) 50 MCG/ACT nasal spray Place 2 sprays into both nostrils daily.  ? HYDROcodone bit-homatropine (HYCODAN) 5-1.5 MG/5ML syrup Take 5 mLs by mouth every 6 (six) hours as needed for cough.  ? ipratropium-albuterol (DUONEB) 0.5-2.5 (3) MG/3ML SOLN TAKE 3 MLS BY NEBULIZATION EVERY 6 (SIX) HOURS AS NEEDED.  ? JANUVIA 100 MG tablet   ? levothyroxine (SYNTHROID) 125 MCG tablet Take 125 mcg by mouth daily before breakfast.  ? metFORMIN (GLUCOPHAGE-XR) 500 MG 24 hr tablet Take 500 mg by mouth 3 (three) times daily. 1575m total daily  ? multivitamin-iron-minerals-folic acid (CENTRUM) chewable tablet Chew 1 tablet by mouth daily.  ? mycophenolate (CELLCEPT) 500  MG tablet Take 1 tablet (500 mg total) by mouth 2 (two) times daily.  ? pantoprazole (PROTONIX) 40 MG tablet TAKE 1 TABLET BY MOUTH EVERY DAY  ? pregabalin (LYRICA) 150 MG capsule Take 1 capsule (150 mg total) by mouth 2 (two) times daily. (Patient taking differently: Take 150 mg by mouth in the morning, at noon, and at bedtime.)  ? [DISCONTINUED] predniSONE (DELTASONE) 10 MG tablet Take 5 tabs for 3 days, then 4 tabs for 3 days then 3 tabs for 3 days, then 2 tabs for 3 days, then 1 tab for 3 days, then stop  ? ?No facility-administered encounter medications on file as of 11/06/2021.  ? ?Physical Exam: ?Gen:      No acute distress ?Neuro: alert and oriented x 3 ?Psych: normal mood and affect  ? ?Data Reviewed: ?Imaging: ?CTA 06/15/2019-no pulmonary embolism, mild diffuse interstitial prominence with bilateral groundglass and nodular airspace opacities.   ? ?High-res CT 07/11/2019-Bilateral fine nodularity with groundglass with no significant air trapping.  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 ? ?High-res CT 11/02/2019-subpleural reticulation with groundglass, traction bronchiectasis no basal gradient.  Indeterminate for UIP. ? ?High-res CT 05/09/2020-stable interstitial lung disease ? ?  High-res CT 11/02/2020-minimal progression of fibrotic lung disease with groundglass opacities ? ?High-res CT 01/08/2021-stable ILD compared to March 2022 ? ?High-res CT 07/04/2021-significantly increased groundglass opacities with progression of fibrotic changes.   ? ?CTA 10/17/2021-no PE, pulmonary fibrosis with groundglass opacity ?I reviewed the images personally. ? ?PFTs: ?[facility] ?11/04/2019 ?FVC 2.53 [68%], FEV1 2.26 [75%], F/F 89, SVC 1.75 [50%], DLCO 14.61 [65%] ?Moderate restriction, diffusion defect. ? ?11/16/2020 ?FVC 2.14 [5%), FEV1 1.95 (62%], F/F 91, TLC 2.24 (41%), DLCO 16.02 (69%] ?Severe restriction, mild diffusion defect. ? ?01/17/2021 ?FVC 2.38 (61%), FEV1 2.12 (68%), F/F 89, TLC 3.86 [72%), DLCO 14.60 (63%) ?Severe restriction,  mild diffusion defect ? ?6-minute walk ?12/27/2020- 363 m ? ?Labs: ?ANA 03/25/2019-negative,  ?Rheumatoid factor 03/25/2019- < 14 ?Repeat CTD serologies 07/18/2019-negative, hypersensitivity panel-negative

## 2021-11-15 ENCOUNTER — Encounter: Payer: Self-pay | Admitting: Pulmonary Disease

## 2021-11-15 NOTE — Telephone Encounter (Signed)
Please advise on refill of cough syrup?  Last OV was 11/06/21.  ?

## 2021-11-18 MED ORDER — HYDROCODONE BIT-HOMATROP MBR 5-1.5 MG/5ML PO SOLN: 5.0000 mL | Freq: Four times a day (QID) | ORAL | 0 refills | Status: DC | PRN

## 2021-11-30 ENCOUNTER — Encounter: Payer: Self-pay | Admitting: Pulmonary Disease

## 2021-12-02 NOTE — Telephone Encounter (Signed)
Katie, please see pts last two emails to [place]. She is also asking if she can do a virtual visit instead of an office visit as she is out of town until next week. Thanks.  ?

## 2021-12-02 NOTE — Telephone Encounter (Signed)
I would advise she follow up with the doctor that manages her fibromyalgia to discuss her aches and pain.  ? ?As far as the sores in her mouth, I saw the message where these have healed. I would continue to use the peroxide rinses for a few more days and then use a medicated OTC lip ointment for the cracking.  ? ?When did her cough worsen? It had began to improve when I saw her on 3/2. Tessalon perles 200 mg Three times a day for cough. Can continue to use cough syrup as needed. Continue to target postnasal drainage with flonase and saline nasal sprays. We can try another prednisone taper but concern her cough will return again once she's off. 4 tabs for 2 days, then 3 tabs for 2 days, 2 tabs for 2 days, then 1 tab for 2 days, then stop. She has required daily prednisone in the past so this may be our next option. What dose of Imuran is she currently on? I would schedule OV as soon as she's back.

## 2021-12-02 NOTE — Telephone Encounter (Signed)
Mychart message sent by pt: ?[name]  [facility] (supporting Chilton Greathouse, MD) 2 days ago  ? ?Concerned about current symptoms.  ?  ?Feels like my fibromyalgia is flared up. A lot of pain throughout entire body, achy muscles, nerves everywhere. Feels like I?m tired all the time. Dizzy spells, daily headaches -wake up with them more often.  ?  ?Pain is getting unbearable, to where I cannot sleep/ trouble sleeping.  ?  ?Coughing has been awful still. To where upper chest hurts so bad I use cold packs & sometimes heating pad. Cough syrup continues to be used as needed.  ?  ?Also woke up this morning with a few sores around inside of mouth & roof of mouth was very sensitive & raw. Even after rinsing etc all day has been painful.  ?  ?Please advise how to manage these symptoms & if I need to do anything. Thank you.  ? ? ? ?With [name] being out of the office, sending this to provider of the day. Katie, please advise. ?

## 2021-12-04 NOTE — Telephone Encounter (Signed)
Katie gave verbal remission to double book schedule for pt to have My Chart visit tomorrow. Nothing further needed at this time.  ?

## 2021-12-04 NOTE — Telephone Encounter (Signed)
Katie you do not have any openings left on Thursday or Friday. Do you want me to double book or give pt first available?  ?

## 2021-12-04 NOTE — Telephone Encounter (Signed)
Virtual visit is fine if I have openings Thursday or Friday. I was unaware reading the first message that these symptoms were in response to increasing Imuran. If she is having worsening symptoms with the increased dose of Imuran, then we will need to look at stopping and possibly changing medications. We can discuss further during a visit.

## 2021-12-05 ENCOUNTER — Telehealth: Payer: 59 | Admitting: Nurse Practitioner

## 2021-12-05 ENCOUNTER — Telehealth (INDEPENDENT_AMBULATORY_CARE_PROVIDER_SITE_OTHER): Payer: 59 | Admitting: Nurse Practitioner

## 2021-12-05 ENCOUNTER — Encounter: Payer: Self-pay | Admitting: Nurse Practitioner

## 2021-12-05 DIAGNOSIS — R058 Other specified cough: Secondary | ICD-10-CM | POA: Diagnosis not present

## 2021-12-05 DIAGNOSIS — J301 Allergic rhinitis due to pollen: Secondary | ICD-10-CM | POA: Diagnosis not present

## 2021-12-05 DIAGNOSIS — J849 Interstitial pulmonary disease, unspecified: Secondary | ICD-10-CM

## 2021-12-05 NOTE — Patient Instructions (Addendum)
Continue Albuterol inhaler 2 puffs or duoneb 3 mL neb every 6 hours as needed for shortness of breath or wheezing. Notify if symptoms persist despite rescue inhaler/neb use. ?Continue Hycodan 5 mL every 6 hours as needed for cough  ?Continue Xyzal 5 mg daily for allergies  ? ?Decrease Imuran to 50 mg from 75 mg to see if this helps your muscle aches/pains ? ?Follow up with [facility] next week as scheduled. ? ?Follow up in 2 weeks with Dr Isaiah Serge or Rhunette Croft, NP. If symptoms do not improve or worsen, please contact office for sooner follow up or seek emergency care. ?

## 2021-12-05 NOTE — Assessment & Plan Note (Addendum)
Given worsening myalgias and arthralgias with increased Imuran dose, will decrease to 50 mg.  She was previously on 25 and went up to 75mg .  Advised her to monitor and notify if symptoms do not improve or resolve with this change.  Overall, respiratory status seems relatively unchanged compared to previous visit.  No worse but no better.  Does have plans to follow-up with Dr. fried at [facility] next week.  Will hold off on any more steroids at this point per patient's wishes. Continue to use Hycodan sparingly for severe cough. Albuterol or DuoNebs as needed. Ok to remain off [place] as no significant change in symptoms. We spent a significant amount of time discussing her frustrations with her symptoms, completely understandable given impact they have on her quality of life. Hope that this dose adjustment helps with her pain and that her appointment with Dr. [place] morgan is helpful. Plans to follow up after to review.  ?

## 2021-12-05 NOTE — Progress Notes (Signed)
? ?Patient ID: [name], female     DOB: 10-27-1975, 46 y.o.      MRN: [id number] ? ?Chief Complaint  ?Patient presents with  ? Follow-up  ?  Cough   ? ? ?Virtual Visit via Video Note ? ?I connected with [name] on 12/05/21 at 12:00 PM EDT by a video enabled telemedicine application and verified that I am speaking with the correct person using two identifiers. ? ?Location: ?Patient: Vacation home ?Provider: Office ?  ?I discussed the limitations of evaluation and management by telemedicine and the availability of in person appointments. The patient expressed understanding and agreed to proceed. ? ?History of Present Illness: ?46 year old female, never smoker followed for ILD with hypersensitivity pneumonitis, post-COVID ILD with long-haul syndrome.  She is a patient of Dr. Matilde Bash and last seen via virtual visit on 11/06/2021.  Past medical history significant for allergic rhinitis, hypothyroidism, PCO, depression with anxiety, fibromyalgia, IBS. ? ?Initially started complaining of increasing dyspnea with chest pressure in mid August 2020.  CTA was obtained by her PCP without evidence of embolism but showed bilateral diffuse pulmonary infiltrates.  COVID-19 was negative.  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  Per the patient, she had an unspecified autoimmune disease and was referred to Dr. Luan Pulling, rheumatology, with negative work-up. ? ?She underwent bronchoscopy on 07/12/2019 with findings of lymphocytosis on BAL and granulomas.  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  She contracted COVID 09/07/2019, with worsening symptoms, and received monoclonal antibody therapy.  She was restarted on prednisone post COVID and tried to taper off;  however she was noted to have persistent symptoms with evolving ILD on CT chest.  Prednisone was increased back to 40 mg daily with biopsy on 12/12/2019. ? ?Completed pulmonary rehab end of 2021 and eventually tapered off steroids in 2021. ? ?Has had follow-up with Dr. 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 ? ?Presented in March 2022 with worsening dyspnea, chest tightness.  Trialed prednisone 40 mg daily for 1 month to evaluate for any improvement.  No change in 6-minute walk, CT and PFTs.  Overall felt as though prednisone was not helping and tapered off. ? ?08/28/2021: OV with Dr. Vaughan Browner.  Initially improved with prednisone in January 2021; stable for some time off steroids but reported worsening chest tightness, dyspnea.  Recent CT with worsening ILD, IPF with groundglass.  Discussed immunosuppression therapy; unable to tolerate CellCept due to rosacea flare.  TPMT levels checked to consider azathioprine.  May need Ofev down the line. ? ?10/04/2021: Virtual visit with Dr. Vaughan Browner.  Follow-up after starting Imuran.  Tolerating well-increase to 75 mg/day.  CBC with differential and CMET ordered which are stable. ? ?10/17/2021: OV with Matison Nuccio NP.  Persistent cough and chest congestion since 2/16.  Previous CXR showed increased bibasal infiltrates, and concerning for infectious process.  She was started on Levaquin 7 day course and prednisone 40 mg daily for 7 days.  HRCT was ordered for 1 to 2 weeks for follow-up.  Instructed to hold off on increasing dose of Imuran.  Again contacted office on 2/21  stating that her cough was worse and she was having body aches and advised to come in for further evaluation.  At [place], reported that her cough had not improved and was slightly worse after completing Levaquin.  She did note that it was primarily  nonproductive which was the only improvement.  She continued to have shortness of breath with exertion and coughing spells.  Experiencing some pleuritic pain and chest tightness.  Also noted episodes of dizziness as well as being slightly tachycardic to 110s in office.  Given her symptoms, CTA with PE protocol was obtained which showed no evidence of PE and without evidence of superimposed infection.  She was treated with extended prednisone taper and postnasal drainage control regimen as well as cough suppression.  Close follow-up. ? ?10/24/2021: Virtual visit with Jaleeya Mcnelly NP. Patient presents today via virtual visit for follow up. She reports she is feeling better. Still having an occasional cough, which has remained nonproductive. Her shortness of breath is better; as well as her dizziness. She is still on her extended prednisone taper and tolerating it well. She continues Breztri Twice daily and rare use of albuterol. She has not had to use her cough syrup daily but did take some this morning. Her allergy symptoms have improved with antihistamine and flonase. Refilled tessalon perles ? ?11/06/2021: Virtual visit with Dr. Vaughan Browner. Continued to be symptomatic with DOE. Increased Imuran from 25 to 75 mg. Previous intolerant to CellCept. Advise she follow up with Dr. Vonita Moss at [facility] given her worsening symptoms.  ? ?12/05/2021: Today - acute visit ?Patient presents today via virtual visit for worsening myalgias and arthralgias after increasing Imuran.  She saw her PCP for this and suspected that her fibromyalgia was flaring related to the immunosuppression from Imuran.  Also reports that she continues to have a persistent, dry cough.  Does see some relief with Hycodan cough syrup which she uses very sparingly.  Shortness of breath with exertion is relatively unchanged.  Occurs with coughing spells more so than anything.  Denies any hemoptysis, anorexia, weight loss or recent fevers.  She is anticipated to see her doctor  at [facility] next week.  She has come off Breztri since we saw her last and feels as though it has not really made a significant difference.  Does not wish to do any more prednisone at this point. ? ?Allergies  ?Allergen Reactions  ? Methimazole Other (See Comments)  ?  Body goes into arthritic shock  ? Bupropion Other (See Comments)  ?  Body aches / headaches   ? ?Immunization History  ?Administered Date(s) Administered  ? Influenza Split 05/19/2012  ? Influenza Whole 06/19/2008  ? Influenza, Quadrivalent, Recombinant, Inj, Pf 05/15/2020  ? Influenza,inj,Quad PF,6+ Mos 04/30/2015, 04/12/2016, 06/23/2018, 05/25/2019, 08/28/2021  ? Influenza,inj,quad, With Preservative 05/15/2020  ? Influenza-Unspecified 04/27/2015, 04/30/2015, 04/12/2016, 04/25/2016, 06/23/2018, 05/25/2019, 05/15/2020  ? PFIZER Comirnaty(Gray Top)Covid-19 Tri-Sucrose Vaccine 01/26/2020, 03/08/2020  ? PFIZER(Purple Top)SARS-COV-2 Vaccination 01/26/2020, 03/08/2020, 05/15/2020  ? Td 07/13/2009  ? ?Past Medical History:  ?Diagnosis Date  ? Anxiety   ? Arthritis   ? Depression   ? Diabetes mellitus without complication ([place])   ? Dyspnea   ? Fibromyalgia   ? GERD (gastroesophageal reflux disease)   ? Headache   ? migraines  ? Hypothyroidism 1999  ? post PTU Rx for hyperthyroidism  ? IBS (irritable bowel syndrome)   ? Interstitial lung disease ([place])   ? Pneumonia 06/2019  ? Double PNA  ? ? ?Tobacco History: ?Social  History  ? ?Tobacco Use  ?Smoking Status Never  ?Smokeless Tobacco Never  ? ?Counseling given: Not Answered ? ? ?Outpatient Medications Prior to Visit  ?Medication Sig Dispense Refill  ? acetaminophen (TYLENOL) 650 MG CR tablet Take 650 mg by mouth every 8 (eight) hours as needed for pain.    ? albuterol (VENTOLIN HFA) 108 (90 Base) MCG/ACT inhaler Inhale into the lungs.    ? azaTHIOprine (IMURAN) 50 MG tablet TAKE 1 AND 1/2 TABLETS BY MOUTH DAILY 135 tablet 1  ? doxycycline (DORYX) 100 MG EC tablet Take 100 mg by mouth daily.    ? DULoxetine  (CYMBALTA) 60 MG capsule TAKE 1 CAPSULE BY MOUTH EVERY DAY (Patient taking differently: Take 60 mg by mouth daily.) 90 capsule 2  ? ferrous sulfate 324 MG TBEC Take 324 mg by mouth.    ? HYDROcodone bit-homatropine

## 2021-12-05 NOTE — Progress Notes (Deleted)
? ?Patient ID: [name], female     DOB: [birth year], 46 y.o.      MRN: [id number] ? ?Chief Complaint  ?Patient presents with  ? Follow-up  ?  Cough   ? ? ?Virtual Visit via Video Note ? ?I connected with [name] on 12/05/21 at 12:00 PM EDT by a video enabled telemedicine application and verified that I am speaking with the correct person using two identifiers. ? ?Location: ?Patient: *** ?Provider: *** ?  ?I discussed the limitations of evaluation and management by telemedicine and the availability of in person appointments. The patient expressed understanding and agreed to proceed. ? ?History of Present Illness: ? ?Allergies  ?Allergen Reactions  ? Methimazole Other (See Comments)  ?  Body goes into arthritic shock  ? Bupropion Other (See Comments)  ?  Body aches / headaches   ? ?Immunization History  ?Administered Date(s) Administered  ? Influenza Split 05/19/2012  ? Influenza Whole 06/19/2008  ? Influenza, Quadrivalent, Recombinant, Inj, Pf 05/15/2020  ? Influenza,inj,Quad PF,6+ Mos 04/30/2015, 04/12/2016, 06/23/2018, 05/25/2019, 08/28/2021  ? Influenza,inj,quad, With Preservative 05/15/2020  ? Influenza-Unspecified 04/27/2015, 04/30/2015, 04/12/2016, 04/25/2016, 06/23/2018, 05/25/2019, 05/15/2020  ? PFIZER Comirnaty(Gray Top)Covid-19 Tri-Sucrose Vaccine 01/26/2020, 03/08/2020  ? PFIZER(Purple Top)SARS-COV-2 Vaccination 01/26/2020, 03/08/2020, 05/15/2020  ? Td 07/13/2009  ? ?Past Medical History:  ?Diagnosis Date  ? Anxiety   ? Arthritis   ? Depression   ? Diabetes mellitus without complication (HCC)   ? Dyspnea   ? Fibromyalgia   ? GERD (gastroesophageal reflux disease)   ? Headache   ? migraines  ? Hypothyroidism 1999  ? post PTU Rx for hyperthyroidism  ? IBS (irritable bowel syndrome)   ? Interstitial lung disease (HCC)   ? Pneumonia 06/2019  ? Double PNA  ? ? ?Tobacco History: ?Social History  ? ?Tobacco Use  ?Smoking Status Never  ?Smokeless Tobacco Never  ? ?Counseling given: Not  Answered ? ? ?Outpatient Medications Prior to Visit  ?Medication Sig Dispense Refill  ? acetaminophen (TYLENOL) 650 MG CR tablet Take 650 mg by mouth every 8 (eight) hours as needed for pain.    ? albuterol (VENTOLIN HFA) 108 (90 Base) MCG/ACT inhaler Inhale into the lungs.    ? azaTHIOprine (IMURAN) 50 MG tablet TAKE 1 AND 1/2 TABLETS BY MOUTH DAILY 135 tablet 1  ? doxycycline (DORYX) 100 MG EC tablet Take 100 mg by mouth daily.    ? DULoxetine (CYMBALTA) 60 MG capsule TAKE 1 CAPSULE BY MOUTH EVERY DAY (Patient taking differently: Take 60 mg by mouth daily.) 90 capsule 2  ? ferrous sulfate 324 MG TBEC Take 324 mg by mouth.    ? HYDROcodone bit-homatropine (HYCODAN) 5-1.5 MG/5ML syrup Take 5 mLs by mouth every 6 (six) hours as needed for cough. 240 mL 0  ? levothyroxine (SYNTHROID) 125 MCG tablet Take 125 mcg by mouth daily before breakfast.    ? metFORMIN (GLUCOPHAGE-XR) 500 MG 24 hr tablet Take 500 mg by mouth 3 (three) times daily. 1500mg  total daily    ? pantoprazole (PROTONIX) 40 MG tablet TAKE 1 TABLET BY MOUTH EVERY DAY 90 tablet 1  ? pregabalin (LYRICA) 150 MG capsule Take 1 capsule (150 mg total) by mouth 2 (two) times daily. (Patient taking differently: Take 150 mg by mouth in the morning, at noon, and at bedtime.) 60 capsule 3  ? benzonatate (TESSALON) 200 MG capsule Take 1 capsule (200 mg total) by mouth 3 (three) times daily as needed for cough. (Patient not taking: Reported on  12/05/2021) 30 capsule 1  ? Budeson-Glycopyrrol-Formoterol (BREZTRI AEROSPHERE) 160-9-4.8 MCG/ACT AERO Inhale 2 puffs into the lungs in the morning and at bedtime. (Patient not taking: Reported on 12/05/2021) 10.7 g 6  ? busPIRone (BUSPAR) 10 MG tablet Take 10 mg by mouth 2 (two) times daily. (Patient not taking: Reported on 12/05/2021)    ? fluticasone (FLONASE) 50 MCG/ACT nasal spray Place 2 sprays into both nostrils daily. (Patient not taking: Reported on 12/05/2021) 18.2 mL 2  ? ipratropium-albuterol (DUONEB) 0.5-2.5 (3) MG/3ML  SOLN TAKE 3 MLS BY NEBULIZATION EVERY 6 (SIX) HOURS AS NEEDED. (Patient not taking: Reported on 12/05/2021) 180 mL 1  ? JANUVIA 100 MG tablet     ? multivitamin-iron-minerals-folic acid (CENTRUM) chewable tablet Chew 1 tablet by mouth daily. (Patient not taking: Reported on 12/05/2021)    ? mycophenolate (CELLCEPT) 500 MG tablet Take 1 tablet (500 mg total) by mouth 2 (two) times daily. (Patient not taking: Reported on 12/05/2021) 30 tablet 5  ? ?No facility-administered medications prior to visit.  ? ?  ?Review of Systems:  ? ?Constitutional: No weight loss or gain, night sweats, fevers, chills, fatigue, or lassitude. ?HEENT: No headaches, difficulty swallowing, tooth/dental problems, or sore throat. No sneezing, itching, ear ache, nasal congestion, or post nasal drip ?CV:  No chest pain, orthopnea, PND, swelling in lower extremities, anasarca, dizziness, palpitations, syncope ?Resp: No shortness of breath with exertion or at rest. No excess mucus or change in color of mucus. No productive or non-productive. No hemoptysis. No wheezing.  No chest wall deformity ?GI:  No heartburn, indigestion, abdominal pain, nausea, vomiting, diarrhea, change in bowel habits, loss of appetite, bloody stools.  ?GU: No dysuria, change in color of urine, urgency or frequency.  No flank pain, no hematuria  ?Skin: No rash, lesions, ulcerations ?MSK:  No joint pain or swelling.  No decreased range of motion.  No back pain. ?Neuro: No dizziness or lightheadedness.  ?Psych: No depression or anxiety. Mood stable.  ? ?Observations/Objective: ? ? ?Assessment and Plan: ?No problem-specific Assessment & Plan notes found for this encounter. ? ? ? ?Follow Up Instructions: ? ?  ?I discussed the assessment and treatment plan with the patient. The patient was provided an opportunity to ask questions and all were answered. The patient agreed with the plan and demonstrated an understanding of the instructions. ?  ?The patient was advised to call back or  seek an in-person evaluation if the symptoms worsen or if the condition fails to improve as anticipated. ? ?I provided *** minutes of non-face-to-face time during this encounter. ? ? ?Noemi Chapel, NP  ? ?

## 2021-12-05 NOTE — Assessment & Plan Note (Signed)
Previously concern for an upper airway component to her cough.  She did have some improvement with last prednisone taper, postnasal drainage control with Flonase and Xyzal.  She has recently restarted taking Xyzal.  Felt like Flonase did not do much for her.  Allergies at this point feel like they are well controlled.  She denies any drainage or nasal congestion. ?

## 2021-12-12 ENCOUNTER — Encounter: Payer: Self-pay | Admitting: Pulmonary Disease

## 2021-12-16 ENCOUNTER — Ambulatory Visit: Payer: 59 | Admitting: Pulmonary Disease

## 2021-12-19 ENCOUNTER — Encounter: Payer: Self-pay | Admitting: Physician Assistant

## 2021-12-19 ENCOUNTER — Ambulatory Visit: Payer: 59 | Admitting: Physician Assistant

## 2021-12-19 VITALS — BP 100/69 | HR 82 | Ht 66.0 in | Wt 178.6 lb

## 2021-12-19 DIAGNOSIS — E119 Type 2 diabetes mellitus without complications: Secondary | ICD-10-CM | POA: Diagnosis not present

## 2021-12-19 DIAGNOSIS — R072 Precordial pain: Secondary | ICD-10-CM

## 2021-12-19 DIAGNOSIS — Z01818 Encounter for other preprocedural examination: Secondary | ICD-10-CM | POA: Diagnosis not present

## 2021-12-19 DIAGNOSIS — J849 Interstitial pulmonary disease, unspecified: Secondary | ICD-10-CM

## 2021-12-19 MED ORDER — METOPROLOL TARTRATE 50 MG PO TABS: ORAL_TABLET | ORAL | 0 refills | Status: DC

## 2021-12-19 NOTE — Patient Instructions (Signed)
Medication Instructions:  ?TAKE Metoprolol Tartrate (Lopressor) 50 mg 2 hours prior to Coronary CTA ?Your physician recommends that you continue on your current medications as directed. Please refer to the Current Medication list given to you today. ? ?*If you need a refill on your cardiac medications before your next appointment, please call your pharmacy* ? ?Lab Work: ?Your physician recommends that you return for lab work :  ?BMET ?If you have labs (blood work) drawn today and your tests are completely normal, you will receive your results only by: ?MyChart Message (if you have MyChart) OR ?A paper copy in the mail ?If you have any lab test that is abnormal or we need to change your treatment, we will call you to review the results. ? ?Testing/Procedures: ?Cardiac CT Angiography (CTA), is a special type of CT scan that uses a computer to produce multi-dimensional views of major blood vessels throughout the body. In CT angiography, a contrast material is injected through an IV to help visualize the blood vessels ? ?Follow-Up: ?At [facility], you and your health needs are our priority.  As part of our continuing mission to provide you with exceptional heart care, we have created designated Provider Care Teams.  These Care Teams include your primary Cardiologist (physician) and Advanced Practice Providers (APPs -  Physician Assistants and Nurse Practitioners) who all work together to provide you with the care you need, when you need it. ? ? ?Your next appointment:   ?As Needed  ? ?The format for your next appointment:   ?In Person ? ?Provider:   ?Kirk Ruths, MD   ? ? ?Other Instructions ? ? ?Your cardiac CT will be scheduled at one of the below locations:  ? ?[facility] ?[address] ?[place], [place] [address] ?[phone] ? ?OR ? ?[facility] [address] ?[place], [place] [address] ?[phone] ? ?If scheduled at [facility],  please arrive at the [facility] and Children's Entrance (Entrance C2) of [facility] 30 minutes prior to test start time. ?You can use the FREE valet parking offered at entrance C (encouraged to control the heart rate for the test)  ?Proceed to the [facility] Radiology Department (first floor) to check-in and test prep. ? ?All radiology patients and guests should use entrance C2 at [facility], accessed from [facility], even though the hospital's physical address listed is [address] ? ? ? ?If scheduled at [facility], please arrive 15 mins early for check-in and test prep. ? ?Please follow these instructions carefully (unless otherwise directed): ? ?On the Night Before the Test: ?Be sure to Drink plenty of water. ?Do not consume any caffeinated/decaffeinated beverages or chocolate 12 hours prior to your test. ?Do not take any antihistamines 12 hours prior to your test. ?If the patient has contrast allergy: ?Patient will need a prescription for Prednisone and very clear instructions (as follows): ?Prednisone 50 mg - take 13 hours prior to test ?Take another Prednisone 50 mg 7 hours prior to test ?Take another Prednisone 50 mg 1 hour prior to test ?Take Benadryl 50 mg 1 hour prior to test ?Patient must complete all four doses of above prophylactic medications. ?Patient will need a ride after test due to Benadryl. ? ?On the Day of the Test: ?Drink plenty of water until 1 hour prior to the test. ?Do not eat any food 4 hours prior to the test. ?You may take your regular  medications prior to the test.  ?Take metoprolol (Lopressor) two hours prior to test. ?HOLD Furosemide/Hydrochlorothiazide morning of the test. ?FEMALES- please wear underwire-free bra if available, avoid dresses & tight clothing ? ?After the Test: ?Drink plenty of water. ?After receiving IV contrast, you may experience a mild flushed feeling. This is normal. ?On occasion, you may  experience a mild rash up to 24 hours after the test. This is not dangerous. If this occurs, you can take Benadryl 25 mg and increase your fluid intake. ?If you experience trouble breathing, this can be serious. If it is severe call 911 IMMEDIATELY. If it is mild, please call our office. ?If you take any of these medications: Glipizide/Metformin, Avandament, Glucavance, please do not take 48 hours after completing test unless otherwise instructed. ? ?We will call to schedule your test 2-4 weeks out understanding that some insurance companies will need an authorization prior to the service being performed.  ? ?For non-scheduling related questions, please contact the cardiac imaging nurse navigator should you have any questions/concerns: ?Marchia Bond, Cardiac Imaging Nurse Navigator ?Gordy Clement, Cardiac Imaging Nurse Navigator ?[facility] ?Direct Office Dial: [phone]  ? ?For scheduling needs, including cancellations and rescheduling, please call [place], [phone]. ? ? ?Important Information About Sugar ? ? ? ? ? ? ?

## 2021-12-19 NOTE — Progress Notes (Signed)
?Cardiology Office Note:   ? ?Date:  12/21/2021  ? ?ID:  [name], DOB 09-09-1975, MRN [id number] ? ?PCP:  Sueanne Margarita, DO ?  ?[facility] Prov[facility] ?Cardiologist:  Kirk Ruths, MD    ? ?Referring MD: Sueanne Margarita, DO  ? ?Chief Complaint  ?Patient presents with  ? Follow-up  ?  Seen for Dr. Stanford Breed  ? ? ?History of Present Illness:   ? ?[name] is a 46 y.o. female with a hx of DM 2, interstitial lung disease, hypothyroidism and anxiety.  She is being treated by pulmonology service for hypersensitivity pneumonitis.  She had COVID-19 back in January 2021.  Previous echocardiogram obtained in November 2020 showed normal EF, mild LAE.  CTA obtained in October 2020 showed no PE.  High-resolution CT in November 2020 showed interstitial lung disease.  Patient was seen by Dr. Stanford Breed in February 2021 who felt her dyspnea is primarily related to interstitial lung disease as she was not volume overloaded on physical exam.  She also had some chest discomfort which was atypical.  Dr. Stanford Breed did discuss with her about potential possibility of cardiac CT however she wished to be conservative at the time and did not wish to proceed.  Lung biopsy performed in April 2021 suggestive of chronic hypersensitivity pneumonitis with heterogeneous fibrosis and superimposed subacute lung injury.  She had a repeat echocardiogram performed on 01/02/2020 which showed EF 60 to 65%, grade 1 DD, normal RV function and RV size, trivial mitral regurgitation.  Most recent high-resolution CT of the chest obtained on 01/27/2020 continue to demonstrate mild pulmonary fibrosis.  Pulmonary function test performed on 02/22/2020 showed evidence of mild restrictive ventilatory defect with moderately reduced diffusing capacity.  She did a 6-minute ambulatory walk in the pulmonology office and did not require supplemental oxygen.  She underwent a repeat biopsy on 02/29/2020 at [facility].  Myoview obtained on 04/05/2020 was normal without ischemia or  infarction, EF 63%.  Since the last time I saw the patient, patient has been followed by Dr. Vaughan Browner of pulmonology service and also specialist Dr. Maceo Pro at [facility].  She is currently on Imuran 75 mg daily.  Previously intolerant of CellCept. ? ?Patient presents today for follow-up.  She continues to have chest tightness especially during worsening dyspnea.  Unfortunately, she continued to have pulmonary issues and becomes dyspneic with minimal activity.  She has been dealing with this for at least 2 years.  We previously felt chest tightness was more pulmonary than cardiac.  However given the persistent chest discomfort, we decided to proceed with coronary CT.  I discussed case with Dr. Stanford Breed.  Her blood pressure is borderline.  I instructed the patient to drink plenty of fluid at the night before and morning of the procedure.  We will give her 50 mg of metoprolol to slow down the heart rate enough to take the picture.  If coronary CT come back negative, I would recommend to follow-up with cardiology service only as needed. ? ? ?Past Medical History:  ?Diagnosis Date  ? Anxiety   ? Arthritis   ? Depression   ? Diabetes mellitus without complication ([place])   ? Dyspnea   ? Fibromyalgia   ? GERD (gastroesophageal reflux disease)   ? Headache   ? migraines  ? Hypothyroidism 1999  ? post PTU Rx for hyperthyroidism  ? IBS (irritable bowel syndrome)   ? Interstitial lung disease ([place])   ? Pneumonia 06/2019  ? Double PNA  ? ? ?Past Surgical History:  ?  Procedure Laterality Date  ? CESAREAN SECTION    ? x 2  ? INTERCOSTAL NERVE BLOCK Right 12/12/2019  ? Procedure: Intercostal Nerve Block;  Surgeon: Grace Isaac, MD;  Location: [place];  Service: Thoracic;  Laterality: Right;  ? VIDEO BRONCHOSCOPY Bilateral 07/12/2019  ? Procedure: VIDEO BRONCHOSCOPY WITH FLUORO;  Surgeon: Marshell Garfinkel, MD;  Location: [place];  Service: Cardiopulmonary;  Laterality: Bilateral;  ? ? ?Current Medications: ?Current Meds  ?Medication Sig   ? acetaminophen (TYLENOL) 650 MG CR tablet Take 650 mg by mouth every 8 (eight) hours as needed for pain.  ? albuterol (VENTOLIN HFA) 108 (90 Base) MCG/ACT inhaler Inhale into the lungs.  ? azaTHIOprine (IMURAN) 50 MG tablet TAKE 1 AND 1/2 TABLETS BY MOUTH DAILY  ? doxycycline (DORYX) 100 MG EC tablet Take 100 mg by mouth daily.  ? DULoxetine (CYMBALTA) 60 MG capsule TAKE 1 CAPSULE BY MOUTH EVERY DAY (Patient taking differently: Take 60 mg by mouth daily.)  ? ferrous sulfate 324 MG TBEC Take 324 mg by mouth.  ? levothyroxine (SYNTHROID) 125 MCG tablet Take 125 mcg by mouth daily before breakfast.  ? metFORMIN (GLUCOPHAGE-XR) 500 MG 24 hr tablet Take 500 mg by mouth 3 (three) times daily. 1500mg  total daily  ? metoprolol tartrate (LOPRESSOR) 50 MG tablet Take tablet by mouth 2 hours prior to Coronary CTA  ? pantoprazole (PROTONIX) 40 MG tablet TAKE 1 TABLET BY MOUTH EVERY DAY  ? pregabalin (LYRICA) 150 MG capsule Take 1 capsule (150 mg total) by mouth 2 (two) times daily. (Patient taking differently: Take 150 mg by mouth in the morning, at noon, and at bedtime.)  ? [DISCONTINUED] HYDROcodone bit-homatropine (HYCODAN) 5-1.5 MG/5ML syrup Take 5 mLs by mouth every 6 (six) hours as needed for cough.  ?  ? ?Allergies:   Bupropion and Methimazole  ? ?Social History  ? ?Socioeconomic History  ? Marital status: Married  ?  Spouse name: Not on file  ? Number of children: 2  ? Years of education: Not on file  ? Highest education level: Not on file  ?Occupational History  ? Occupation: Medical illustrator  ?  Employer: [employer]  ?Tobacco Use  ? Smoking status: Never  ? Smokeless tobacco: Never  ?Vaping Use  ? Vaping Use: Never used  ?Substance and Sexual Activity  ? Alcohol use: Yes  ?  Comment: 2 glasses of vodka daily  ? Drug use: No  ? Sexual activity: Not on file  ?Other Topics Concern  ? Not on file  ?Social History Narrative  ? Not on file  ? ?Social Determinants of Health  ? ?Financial Resource Strain: Not on file  ?Food  Insecurity: Not on file  ?Transportation Needs: Not on file  ?Physical Activity: Not on file  ?Stress: Not on file  ?Social Connections: Not on file  ?  ? ?Family History: ?The patient's family history includes ADD / ADHD in her son; Cancer in her paternal grandfather; Diabetes in her father and paternal grandmother; Hyperlipidemia in her mother. There is no history of Other. ? ?ROS:   ?Please see the history of present illness.    ? All other systems reviewed and are negative. ? ?EKGs/Labs/Other Studies Reviewed:   ? ?The following studies were reviewed today: ? ?Echo 01/02/2020 ?1. Left ventricular ejection fraction, by estimation, is 60 to 65%. The  ?left ventricle has normal function. The left ventricle has no regional  ?wall motion abnormalities. Left ventricular diastolic parameters are  ?consistent with Grade  I diastolic  ?dysfunction (impaired relaxation).  ? 2. Right ventricular systolic function is normal. The right ventricular  ?size is normal.  ? 3. The mitral valve is normal in structure. Trivial mitral valve  ?regurgitation. No evidence of mitral stenosis.  ? 4. The aortic valve is normal in structure. Aortic valve regurgitation is  ?not visualized. No aortic stenosis is present.  ? 5. The inferior vena cava is normal in size with greater than 50%  ?respiratory variability, suggesting right atrial pressure of 3 mmHg.  ? ?EKG:  EKG is ordered today.  The ekg ordered today demonstrates normal sinus rhythm, no significant ST-T wave changes.  Poor R wave progression in the anterior leads. ? ?Recent Labs: ?10/04/2021: ALT 14; BUN 14; Creatinine, Ser 0.68; Hemoglobin 13.6; Platelets 266.0; Potassium 4.0; Sodium 139  ?Recent Lipid Panel ?   ?Component Value Date/Time  ? CHOL 184 06/23/2018 1129  ? TRIG 136.0 06/23/2018 1129  ? HDL 71.00 06/23/2018 1129  ? CHOLHDL 3 06/23/2018 1129  ? VLDL 27.2 06/23/2018 1129  ? [place] 86 06/23/2018 1129  ? LDLCALC 113 (H) 01/22/2018 1546  ? ? ? ?Risk Assessment/Calculations:    ?  ? ?    ? ?Physical Exam:   ? ?VS:  BP 100/69 (BP Location: Left Arm, Patient Position: Sitting)   Pulse 82   Ht 5\' 6"  (1.676 m)   Wt 178 lb 9.6 oz (81 kg)   SpO2 93%   BMI 28.83 kg/m?    ? ?Wt R

## 2021-12-20 ENCOUNTER — Other Ambulatory Visit: Payer: Self-pay | Admitting: Pulmonary Disease

## 2021-12-20 MED ORDER — HYDROCODONE BIT-HOMATROP MBR 5-1.5 MG/5ML PO SOLN
5.0000 mL | Freq: Four times a day (QID) | ORAL | 0 refills | Status: DC | PRN
Start: 2021-12-20 — End: 2022-02-11

## 2021-12-20 NOTE — Telephone Encounter (Signed)
Dr. Mannam, please advise on this for pt. 

## 2021-12-20 NOTE — Telephone Encounter (Signed)
Prescription has been sent to pharmacy.

## 2021-12-21 ENCOUNTER — Encounter: Payer: Self-pay | Admitting: Physician Assistant

## 2021-12-26 ENCOUNTER — Encounter: Payer: Self-pay | Admitting: Pulmonary Disease

## 2021-12-26 ENCOUNTER — Encounter: Payer: Self-pay | Admitting: *Deleted

## 2021-12-26 ENCOUNTER — Ambulatory Visit: Payer: 59 | Admitting: Pulmonary Disease

## 2021-12-26 VITALS — BP 106/64 | HR 107 | Temp 97.8°F | Ht 67.0 in | Wt 180.6 lb

## 2021-12-26 DIAGNOSIS — Z5181 Encounter for therapeutic drug level monitoring: Secondary | ICD-10-CM | POA: Diagnosis not present

## 2021-12-26 DIAGNOSIS — J849 Interstitial pulmonary disease, unspecified: Secondary | ICD-10-CM | POA: Diagnosis not present

## 2021-12-26 LAB — COMPREHENSIVE METABOLIC PANEL
ALT: 10 U/L (ref 0–35)
AST: 17 U/L (ref 0–37)
Albumin: 4.2 g/dL (ref 3.5–5.2)
Alkaline Phosphatase: 78 U/L (ref 39–117)
BUN: 11 mg/dL (ref 6–23)
CO2: 30 mEq/L (ref 19–32)
Calcium: 9.1 mg/dL (ref 8.4–10.5)
Chloride: 98 mEq/L (ref 96–112)
Creatinine, Ser: 0.73 mg/dL (ref 0.40–1.20)
GFR: 98.48 mL/min (ref >60.00)
Glucose, Bld: 137 mg/dL — ABNORMAL HIGH (ref 70–99)
Potassium: 4.1 mEq/L (ref 3.5–5.1)
Sodium: 136 mEq/L (ref 135–145)
Total Bilirubin: 0.4 mg/dL (ref 0.2–1.2)
Total Protein: 6.9 g/dL (ref 6.0–8.3)

## 2021-12-26 LAB — CBC WITH DIFFERENTIAL/PLATELET
Basophils Absolute: 0.1 10*3/uL (ref 0.0–0.1)
Basophils Relative: 0.8 % (ref 0.0–3.0)
Eosinophils Absolute: 0.4 10*3/uL (ref 0.0–0.7)
Eosinophils Relative: 5 % (ref 0.0–5.0)
HCT: 41.9 % (ref 36.0–46.0)
Hemoglobin: 13.8 g/dL (ref 12.0–15.0)
Lymphocytes Relative: 14.6 % (ref 12.0–46.0)
Lymphs Abs: 1.1 10*3/uL (ref 0.7–4.0)
MCHC: 33 g/dL (ref 30.0–36.0)
MCV: 91.7 fl (ref 78.0–100.0)
Monocytes Absolute: 0.7 10*3/uL (ref 0.1–1.0)
Monocytes Relative: 8.9 % (ref 3.0–12.0)
Neutro Abs: 5.5 10*3/uL (ref 1.4–7.7)
Neutrophils Relative %: 70.7 % (ref 43.0–77.0)
Platelets: 301 10*3/uL (ref 150.0–400.0)
RBC: 4.57 Mil/uL (ref 3.87–5.11)
RDW: 13.8 % (ref 11.5–15.5)
WBC: 7.8 10*3/uL (ref 4.0–10.5)

## 2021-12-26 NOTE — Patient Instructions (Signed)
Will await the assessment from [facility] at multidisciplinary conference and rheumatology evaluation ?We will check labs today ?We will give a letter recommending that he stay at home from work ?Return to clinic in 1 month with in person or video visit ?

## 2021-12-26 NOTE — Progress Notes (Addendum)
[name]    [id number]    November 08, 1975  Primary Care Physician:Skakle, Liane Comber, DO  Referring Physician: Sueanne Margarita, [address] [place],  [place] [address] .  Chief complaint: Follow-up for interstitial lung disease, hypersensitivity pneumonitis.  HPI: 46 year old with history of fibromyalgia, depression, anxiety Referred for evaluation of abnormal CT scan, ILD  Complains of increasing dyspnea with chest pressure since mid August 2020.  She had a CTA done by her primary care which did not show any embolism but showed bilateral diffuse pulmonary infiltrates.  COVID-19 test was negative.  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 Trudie Reed, rheumatology.  She had a work-up done including labs which were reportedly negative.  CTD serologies and other labs were negative as well.  She underwent bronchoscopy on 07/12/2019 with findings of lymphocytosis on BAL and granulomas. 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  She was restarted on prednisone post COVID due to worsening dyspnea.  Given persistent symptoms she had surgical lung biopsy done on 12/12/19 with discussion at multidisciplinary conference showing features suggestive of chronic hypersensitivity pneumonitis with heterogeneous fibrosis and superimposed subacute lung injury.  Finished pulmonary rehab at the end of 2021 which did not help with symptoms She was eventually tapered off steroids in 2021  In the interim she has followed up with Dr. 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  She has enrolled in long-term Covid clinic at [facility] and underwent a cardiac MRI and had a xenon gas lung scan for research purposes  Revaluated in March 2022 with worsening dyspnea, chest tightness. Plan discussed over telephone with her pulmonologist at [facility] who suggested a trial of prednisone 40 mg a day for 1 month to see if there is any improvement.  She completed a trial of prednisone with no change on 6-minute walk, CT and PFTs. Overall she feels that the prednisone has not helped.  She has had weight gain with the prednisone and elevated heart to control blood sugars  Pets: Has dogs.  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  She has a down comforter for many years and got a down pillow in 2019. She got rid of the down after bronchoscopy in late 2020 Smoking history: No significant smoking history Travel history: Previously lived in [place], [place].  Has been living in [place] for the past 18 years.  No significant recent travel Relevant family history: Grandfather died of lung cancer.  No other significant family history of lung disease.  Interim history:  Follow-up CT earlier in late 2022 showed worsening interstitial changes with fibrosis and groundglass opacities.  She continues to be dyspneic.  Given worsening CT findings after discussion with her started CellCept however within a few weeks she had a flareup of her rosacea and CellCept stopped.    Started on azathioprine in January 2023.  She got antibiotics in February for a chest x-ray showing dyspnea with infiltrates.  CTA did not show any PE.  Azathioprine was increased to 75 mg and April 2023 but she developed diffuse body ache and dose was dropped back to 50 mg/day  She has since followed up with Dr. Maceo Pro at [facility] for reassessment to ordered repeat autoimmune profile  showing elevated ANA and aldolase.  Still awaiting discussion at the multidisciplinary conference for recommendation.  She also has a follow-up with [facility] rheumatology later this month  Outpatient Encounter Medications as of 12/26/2021  Medication Sig   acetaminophen (TYLENOL) 650 MG CR tablet Take 650 mg by mouth every 8 (eight) hours as needed for pain.   albuterol (VENTOLIN HFA) 108 (90 Base) MCG/ACT inhaler Inhale into the lungs.   azaTHIOprine (IMURAN) 50 MG tablet TAKE 1 AND 1/2 TABLETS BY MOUTH DAILY   doxycycline (DORYX) 100 MG EC tablet Take 100 mg by mouth daily.   DULoxetine (CYMBALTA) 60 MG capsule TAKE 1 CAPSULE BY MOUTH EVERY DAY (Patient taking differently: Take 60 mg by mouth daily.)   ferrous sulfate 324 MG TBEC Take 324 mg by mouth.   HYDROcodone bit-homatropine (HYCODAN) 5-1.5 MG/5ML syrup Take 5 mLs by mouth every 6 (six) hours as needed for cough.   ipratropium-albuterol (DUONEB) 0.5-2.5 (3) MG/3ML SOLN TAKE 3 MLS BY NEBULIZATION EVERY 6 (SIX) HOURS AS NEEDED.   levothyroxine (SYNTHROID) 125 MCG tablet Take 125 mcg by mouth daily before breakfast.   metFORMIN (GLUCOPHAGE-XR) 500 MG 24 hr tablet Take 500 mg by mouth 3 (three) times daily. 1525m total daily   metoprolol tartrate (LOPRESSOR) 50 MG tablet Take tablet by mouth 2 hours prior to Coronary CTA   pantoprazole (PROTONIX) 40 MG tablet TAKE 1 TABLET BY MOUTH EVERY DAY   pregabalin (LYRICA) 150 MG capsule Take 1 capsule (150 mg total) by mouth 2 (two) times daily. (Patient taking differently: Take 150 mg by mouth in the morning, at noon, and at bedtime.)   [DISCONTINUED] fluticasone (FLONASE) 50 MCG/ACT nasal spray Place 2 sprays into both nostrils daily. (Patient not taking: Reported on 12/05/2021)   No facility-administered encounter medications on file as of 12/26/2021.   Physical Exam: Blood pressure 106/64, pulse (!) 107, temperature 97.8 F (36.6 C), temperature source Oral, height _0  (1.702 m), weight 180 lb  9.6 oz (81.9 kg), SpO2 93 %. Gen:      No acute distress HEENT:  EOMI, sclera anicteric Neck:     No masses; no thyromegaly Lungs:    Clear to auscultation bilaterally; normal respiratory effort CV:         Regular rate and rhythm; no murmurs Abd:      + bowel sounds; soft, non-tender; no palpable masses, no distension Ext:    No edema; adequate peripheral perfusion Skin:      Warm and dry; no rash Neuro: alert and oriented x 3 Psych: normal mood and affect   Data Reviewed: Imaging: CTA 06/15/2019-no pulmonary embolism, mild diffuse interstitial prominence with bilateral groundglass and nodular airspace opacities.    High-res CT 07/11/2019-Bilateral fine nodularity with groundglass with no significant air trapping.  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  High-res CT 11/02/2019-subpleural reticulation with groundglass, traction bronchiectasis no basal gradient.  Indeterminate for UIP.  High-res CT 05/09/2020-stable interstitial lung disease  High-res CT 11/02/2020-minimal progression of fibrotic lung disease with groundglass opacities  High-res CT 01/08/2021-stable ILD compared to March 2022  High-res CT 07/04/2021-significantly increased groundglass opacities with progression of fibrotic changes.    CTA 10/17/2021-no PE, pulmonary fibrosis with groundglass opacity I reviewed  the images personally.  PFTs: [facility] 11/04/2019 FVC 2.53 [68%], FEV1 2.26 [75%], F/F 89, SVC 1.75 [50%], DLCO 14.61 [65%] Moderate restriction, diffusion defect.  11/16/2020 FVC 2.14 [5%), FEV1 1.95 (62%], F/F 91, TLC 2.24 (41%), DLCO 16.02 (69%] Severe restriction, mild diffusion defect.  01/17/2021 FVC 2.38 (61%), FEV1 2.12 (68%), F/F 89, TLC 3.86 [72%), DLCO 14.60 (63%) Severe restriction, mild diffusion defect  6-minute walk 12/27/2020- 89 m  Labs: ANA 03/25/2019-negative,  Rheumatoid factor 03/25/2019- < 14 Repeat CTD serologies 07/18/2019-negative, hypersensitivity panel-negative  CBC 06/15/2019-WBC  5.87, eos 6.4%, absolute eosinophil count 295 Metabolic panel 62/13/0865-HQIONG normal limits D-dimer 06/30/2019-0.41 BNP 06/30/2019-10.   Bronchoscopy 07/12/2019 WBC 510, lymphocytes 38%, eos 12%, Microbiology-negative Cytology -no malignancy Transbronchial biopsy-nonnecrotizing epithelioid granulomas SARS-CoV-2, viral PCR-negative  Surgical lung biopsy 12/12/2019- features suggestive of chronic hypersensitivity pneumonitis with heterogeneous fibrosis and superimposed subacute lung injury.  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  Surgical lung biopsy discussed at our conference and [facility] agree with diagnosis of chronic hypersensitivity pneumonitis, post Covid ILD  She has been stable for some time, off steroids but now has increasing chest tightness, dyspnea. Repeat CT reviewed with worsening ILD, pulmonary fibrosis with groundglass  It is unclear to me as to why she is worsening now.  She has not responded to multiple rounds of prednisone including prolonged taper earlier this year. There is low suspicion for any infectious etiology. Given worsening changes on CT and by symptoms we have decided to start immunosuppression since prednisone is not helping and she has side effects to  steroids.  She eventually may need to be started on Ofev down the line.  Has not tolerated CellCept Currently on azathioprine 50 mg.  She has not tolerated a higher dose of azathioprine Recheck labs for monitoring  Follow-up with Dr. Stanford Breed, cardiology and Dr. Maceo Pro, [facility].  I will reach out to her to get recommendations from the multidisciplinary conference Letter given for her to work remotely from home till end of the year.  Plan/Recommendations: - Continue azathioprine at 50 mg/day - Check labs  Marshell Garfinkel MD [facility] 12/26/2021, 9:01 AM  CC: Sueanne Margarita, DO   Addendum: Received office note from Leigh Aurora, rheumatology dated 01/02/2022 She was seen for low positive CCP, elevated ANA and aldolase.  Myositis panel still pending at [facility] Additional labs ordered including repeat aldolase.  Overall clinically low suspicion to suggest rheumatoid arthritis or polymyositis.  Labs dated 01/01/2022 CK, C4 complement, C3 complement, aldolase and CCP were repeated are all negative Negative double-stranded DNA, RNP, Smith, SCL 70, SSA SSB Jo 1 and centromere antibodies. Myositis panel dated 12/19/2021 was negative  Marshell Garfinkel MD [facility] 01/14/2022, 9:18 AM

## 2022-01-02 ENCOUNTER — Ambulatory Visit (HOSPITAL_COMMUNITY): Payer: 59

## 2022-01-02 ENCOUNTER — Telehealth: Payer: Self-pay | Admitting: Pulmonary Disease

## 2022-01-03 NOTE — Telephone Encounter (Signed)
Called and spoke with patient to let her know that she is going to have to call medical records to get all on her information sent to Dr. Shawnee Knapp office. She is requesting for everything from 2020 be sent over there. Asked her if she would like the number to medical records. She stated that she has it. Nothing further needed at this time. ?

## 2022-01-08 ENCOUNTER — Ambulatory Visit (HOSPITAL_COMMUNITY)
Admission: RE | Admit: 2022-01-08 | Discharge: 2022-01-08 | Disposition: A | Discharge status: Home / Self Care | Payer: 59 | Source: Ambulatory Visit | Attending: Physician Assistant | Admitting: Physician Assistant

## 2022-01-08 ENCOUNTER — Encounter (HOSPITAL_COMMUNITY): Payer: Self-pay

## 2022-01-08 DIAGNOSIS — R072 Precordial pain: Secondary | ICD-10-CM | POA: Diagnosis not present

## 2022-01-08 MED ORDER — NITROGLYCERIN 0.4 MG SL SUBL
0.8000 mg | SUBLINGUAL_TABLET | Freq: Once | SUBLINGUAL | Status: AC
  Administered 2022-01-08: 0.8 mg via SUBLINGUAL

## 2022-01-08 MED ORDER — IOHEXOL 350 MG/ML SOLN
95.0000 mL | Freq: Once | INTRAVENOUS | Status: AC | PRN
  Administered 2022-01-08: 95 mL via INTRAVENOUS

## 2022-01-08 MED ORDER — NITROGLYCERIN 0.4 MG SL SUBL
SUBLINGUAL_TABLET | SUBLINGUAL | Status: AC
  Filled 2022-01-08: qty 2

## 2022-01-10 ENCOUNTER — Other Ambulatory Visit: Payer: Self-pay

## 2022-01-10 DIAGNOSIS — I517 Cardiomegaly: Secondary | ICD-10-CM

## 2022-01-27 ENCOUNTER — Ambulatory Visit (HOSPITAL_COMMUNITY): Payer: 59 | Attending: Cardiovascular Disease

## 2022-01-27 DIAGNOSIS — I517 Cardiomegaly: Secondary | ICD-10-CM

## 2022-01-27 LAB — ECHOCARDIOGRAM COMPLETE
Area-P 1/2: 3.08 cm2
S' Lateral: 2.7 cm

## 2022-02-11 ENCOUNTER — Ambulatory Visit
Admission: RE | Admit: 2022-02-11 | Discharge: 2022-02-11 | Disposition: A | Discharge status: Home / Self Care | Payer: 59 | Source: Ambulatory Visit | Attending: Registered Nurse | Admitting: Registered Nurse

## 2022-02-11 ENCOUNTER — Other Ambulatory Visit: Payer: Self-pay | Admitting: Registered Nurse

## 2022-02-11 ENCOUNTER — Telehealth: Payer: Self-pay | Admitting: Pulmonary Disease

## 2022-02-11 ENCOUNTER — Telehealth (INDEPENDENT_AMBULATORY_CARE_PROVIDER_SITE_OTHER): Payer: 59 | Admitting: Pulmonary Disease

## 2022-02-11 DIAGNOSIS — Z5181 Encounter for therapeutic drug level monitoring: Secondary | ICD-10-CM | POA: Diagnosis not present

## 2022-02-11 DIAGNOSIS — R1030 Lower abdominal pain, unspecified: Secondary | ICD-10-CM

## 2022-02-11 MED ORDER — IOPAMIDOL (ISOVUE-300) INJECTION 61%
100.0000 mL | Freq: Once | INTRAVENOUS | Status: AC | PRN
  Administered 2022-02-11: 100 mL via INTRAVENOUS

## 2022-02-11 MED ORDER — ALBUTEROL SULFATE HFA 108 (90 BASE) MCG/ACT IN AERS: 1.0000 | INHALATION_SPRAY | Freq: Four times a day (QID) | RESPIRATORY_TRACT | 11 refills | Status: DC | PRN

## 2022-02-11 MED ORDER — HYDROCODONE BIT-HOMATROP MBR 5-1.5 MG/5ML PO SOLN: 5.0000 mL | Freq: Four times a day (QID) | ORAL | 0 refills | Status: DC | PRN

## 2022-02-11 NOTE — Telephone Encounter (Signed)
Hycodan refill sent to requested pharmacy by [name].  Albuterol refill sent to requested [facility] in [address].  Patient is aware. Nothing further at this time.

## 2022-02-12 ENCOUNTER — Telehealth: Payer: Self-pay | Admitting: Pulmonary Disease

## 2022-02-12 ENCOUNTER — Other Ambulatory Visit (INDEPENDENT_AMBULATORY_CARE_PROVIDER_SITE_OTHER): Payer: 59

## 2022-02-12 DIAGNOSIS — Z5181 Encounter for therapeutic drug level monitoring: Secondary | ICD-10-CM | POA: Diagnosis not present

## 2022-02-12 LAB — CBC WITH DIFFERENTIAL/PLATELET
Basophils Absolute: 0 10*3/uL (ref 0.0–0.1)
Basophils Relative: 0.7 % (ref 0.0–3.0)
Eosinophils Absolute: 0.3 10*3/uL (ref 0.0–0.7)
Eosinophils Relative: 4.7 % (ref 0.0–5.0)
HCT: 41 % (ref 36.0–46.0)
Hemoglobin: 13.6 g/dL (ref 12.0–15.0)
Lymphocytes Relative: 16.6 % (ref 12.0–46.0)
Lymphs Abs: 1 10*3/uL (ref 0.7–4.0)
MCHC: 33.1 g/dL (ref 30.0–36.0)
MCV: 91.7 fl (ref 78.0–100.0)
Monocytes Absolute: 0.7 10*3/uL (ref 0.1–1.0)
Monocytes Relative: 11.1 % (ref 3.0–12.0)
Neutro Abs: 3.9 10*3/uL (ref 1.4–7.7)
Neutrophils Relative %: 66.9 % (ref 43.0–77.0)
Platelets: 274 10*3/uL (ref 150.0–400.0)
RBC: 4.47 Mil/uL (ref 3.87–5.11)
RDW: 12.9 % (ref 11.5–15.5)
WBC: 5.9 10*3/uL (ref 4.0–10.5)

## 2022-02-13 MED ORDER — LEVALBUTEROL TARTRATE 45 MCG/ACT IN AERO: 2.0000 | INHALATION_SPRAY | RESPIRATORY_TRACT | 12 refills | Status: DC | PRN

## 2022-02-13 NOTE — Telephone Encounter (Signed)
Spoke with pt and notified her [name] ok'ed Levalbuterol order. Levalbuterol order was placed. Pt stated understanding. Nothing further needed at this time.

## 2022-02-13 NOTE — Telephone Encounter (Signed)
Called and spoke with patient. Patient stated that her insurance won't cover albuterol brand anymore and her pharmacy wanted her to try the levalbuterol instead for insurance to cover and she wanted to make sure it was okay with the provider.   PM, please advise.

## 2022-02-13 NOTE — Telephone Encounter (Signed)
Yes. Ok with Levalbuterol order.  Please send it in.

## 2022-02-17 ENCOUNTER — Encounter: Payer: Self-pay | Admitting: Pulmonary Disease

## 2022-03-05 ENCOUNTER — Telehealth: Payer: Self-pay | Admitting: Pulmonary Disease

## 2022-03-05 DIAGNOSIS — J849 Interstitial pulmonary disease, unspecified: Secondary | ICD-10-CM

## 2022-03-05 NOTE — Telephone Encounter (Signed)
Called patient and she states she is seeking information on wheelchair. She states that this was discussed at last office visit with Dr Isaiah Serge. But I do not see it anywhere in the OV notes about wheelchair. Advised patient that I would have to get approval to place order for wheelchair per dr.   Dr Isaiah Serge are you ok with [place] placing order for wheelchair for patient.   Please advise sir

## 2022-03-06 NOTE — Telephone Encounter (Signed)
Ok to place order for wheelchair as requested by the patient

## 2022-03-06 NOTE — Telephone Encounter (Signed)
Order for pt to receive a wheelchair has been placed. Called and spoke with pt letting her know that this was being done and she verbalized understanding. Nothing further needed.

## 2022-03-08 DIAGNOSIS — Z79899 Other long term (current) drug therapy: Secondary | ICD-10-CM | POA: Insufficient documentation

## 2022-03-10 DIAGNOSIS — R32 Unspecified urinary incontinence: Secondary | ICD-10-CM | POA: Insufficient documentation

## 2022-03-21 ENCOUNTER — Encounter: Payer: Self-pay | Admitting: Pulmonary Disease

## 2022-03-21 ENCOUNTER — Other Ambulatory Visit: Payer: Self-pay | Admitting: *Deleted

## 2022-03-21 ENCOUNTER — Encounter: Payer: Self-pay | Admitting: Cardiology

## 2022-03-21 ENCOUNTER — Ambulatory Visit (INDEPENDENT_AMBULATORY_CARE_PROVIDER_SITE_OTHER): Payer: 59

## 2022-03-21 DIAGNOSIS — R002 Palpitations: Secondary | ICD-10-CM

## 2022-03-21 NOTE — Progress Notes (Unsigned)
Registered ZIO XT to be mailed to pt's home address 

## 2022-03-24 ENCOUNTER — Other Ambulatory Visit: Payer: Self-pay | Admitting: Pulmonary Disease

## 2022-03-24 DIAGNOSIS — R002 Palpitations: Secondary | ICD-10-CM | POA: Diagnosis not present

## 2022-03-26 MED ORDER — HYDROCODONE BIT-HOMATROP MBR 5-1.5 MG/5ML PO SOLN: 5.0000 mL | Freq: Four times a day (QID) | ORAL | 0 refills | Status: DC | PRN

## 2022-04-01 ENCOUNTER — Encounter: Payer: Self-pay | Admitting: Pulmonary Disease

## 2022-04-03 ENCOUNTER — Ambulatory Visit (INDEPENDENT_AMBULATORY_CARE_PROVIDER_SITE_OTHER): Payer: 59

## 2022-04-03 ENCOUNTER — Ambulatory Visit: Payer: 59 | Admitting: Nurse Practitioner

## 2022-04-03 ENCOUNTER — Encounter: Payer: Self-pay | Admitting: Nurse Practitioner

## 2022-04-03 VITALS — BP 116/72 | HR 92 | Temp 98.0°F | Ht 67.0 in | Wt 180.6 lb

## 2022-04-03 DIAGNOSIS — J849 Interstitial pulmonary disease, unspecified: Secondary | ICD-10-CM

## 2022-04-03 DIAGNOSIS — J4541 Moderate persistent asthma with (acute) exacerbation: Secondary | ICD-10-CM

## 2022-04-03 DIAGNOSIS — J189 Pneumonia, unspecified organism: Secondary | ICD-10-CM

## 2022-04-03 LAB — POC COVID19 BINAXNOW: SARS Coronavirus 2 Ag: NEGATIVE

## 2022-04-03 LAB — NITRIC OXIDE: Nitric Oxide: 35

## 2022-04-03 MED ORDER — BENZONATATE 100 MG PO CAPS: 100.0000 mg | ORAL_CAPSULE | Freq: Three times a day (TID) | ORAL | 1 refills | Status: DC | PRN

## 2022-04-03 MED ORDER — METHYLPREDNISOLONE ACETATE 80 MG/ML IJ SUSP
80.0000 mg | Freq: Once | INTRAMUSCULAR | Status: AC
  Administered 2022-04-03: 80 mg via INTRAMUSCULAR

## 2022-04-03 MED ORDER — BUDESONIDE-FORMOTEROL FUMARATE 160-4.5 MCG/ACT IN AERO: 2.0000 | INHALATION_SPRAY | Freq: Two times a day (BID) | RESPIRATORY_TRACT | 12 refills | Status: DC

## 2022-04-03 MED ORDER — AMOXICILLIN-POT CLAVULANATE 875-125 MG PO TABS: 1.0000 | ORAL_TABLET | Freq: Two times a day (BID) | ORAL | 0 refills | Status: AC

## 2022-04-03 MED ORDER — PREDNISONE 10 MG PO TABS: ORAL_TABLET | ORAL | 0 refills | Status: DC

## 2022-04-03 NOTE — Progress Notes (Signed)
_0  ID: [name], female    DOB: 07-21-1976, 46 y.o.   MRN: [id number]  Chief Complaint  Patient presents with   Acute Visit    Increased SOB and coughing since last Wednesday. Non-productive cough. Increased wheezing.     Referring provider: Sueanne Margarita, DO  HPI: 46 year old female, never smoker followed for ILD with hypersensitivity pneumonitis, post COVID ILD with long-haul syndrome. She is a patient of Dr. Vaughan Browner and last seen via virtual visit on 10/04/2021. Past medical history significant for allergic rhinitis, hypothyroidism, PCO, depression with anxiety, fibromyalgia, IBS.  Initially started complaining of increasing dyspnea with chest pressure in mid August 2020. CTA was obtained by her PCP without evidence of embolism but showed b/l diffuse pulmonary infiltrates. COVID 19 was negative. 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 Per the patient, she had an unspecified autoimmune disease and was referred to Dr. Trudie Reed, rheumatology, with negative work up.   She underwent bronchoscopy on 07/12/2019 with findings of lymphocytosis on BAL and granulomas. Her case was discussed at multidisciplinary conference on 08/02/2019 with diagnosis of hypersensitivity pneumonitis d/t down exposure and started on prednisone 40 mg daily with slow taper to be off by early Jan 2021. She contracted COVID 09/07/2019, with worsening symptoms, and received monoclonal antibody therapy. She was restarted on prednisone post COVID and tried to taper off; however, she was noted to have persistent symptoms with evolving ILD on CT chest. Prednisone was increased back to 40 mg daily with biopsy on 12/12/2019.   Completed pulmonary rehab end of 2021 and eventually tapered off steroids in 2021.   Has followed with Dr. 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   Presented in March 2022 with worsening dyspnea, chest tightness. Trialed prednisone 40 mg daily for 1 month to evaluated for any improvement. No change in 6 minute walk, CT, and PFTs. Overall, felt as though prednisone was not helping and tapered off.   TEST/EVENTS:  Bronchoscopy 07/12/2019 WBC 510, lymphocytes 38%, eos 12%, Microbiology-negative Cytology -no malignancy Transbronchial biopsy-nonnecrotizing epithelioid granulomas SARS-CoV-2, viral PCR-negative   Surgical lung biopsy 12/12/2019- features suggestive of chronic hypersensitivity pneumonitis with heterogeneous fibrosis and superimposed subacute lung injury.  01/17/2021 PFTs: FVC 2.38 (61), FEV1 2.12 (68), ratio 89, TLC 72%, DLCOunc 63% 07/05/2021 HRCT chest: redemonstrated moderate IPF, in apical to basal gradient pattern, with irregular peripheral interstitial opacity, septal thickening, extensive ground-glass, and areas of subpleural bronchiolectasis at lung bases. Fibrotic findings are appreciably worsened when compared to previous exam, with increase in ground glass. S/p right lung wedge resection.  10/17/2021 CTA chest: No PE, pulmonary fibrosis with groundglass opacity  02/11/2022: Virtual visit with Dr. Vaughan Browner.  Started on [place] in January 2023.  She was treated with Levaquin course for infiltrates on CXR in February 2023.  CTA was obtained for persistent DOE and was negative for PE.  Increased Imuran to 75 mg in April 2023 but she developed diffuse body aches and was dropped back down to 50 mg/day.  Has since followed up with Dr. Maceo Pro at [facility] for reassessment to order repeat autoimmune profile showing elevated ANA and aldolase.  She was seen by Dr. Amil Amen rheumatology who noted no evidence of autoimmune disease.  04/03/2022: Today - acute Patient  presents today for acute visit. Since I saw her last, she was seen by Dr. Vaughan Browner on  6/20 and by Dr. Maceo Pro at [facility] on 7/17 who repeated her pulmonary function testing, which was declined when compared to previous. Imuran was slowly increased to max dose of 150 mg, which she started yesterday. They will likely initiate antifibrotic therapy if she is able to tolerate full dose of Imuran. She was also referred to pulmonary rehab. Today, she reports that over the past week, she has developed worsening of her cough. It has caused significant chest discomfort and is paroxysmal. She has shortness of breath with coughing spells. She has been using her albuterol inhaler, which does provide some relief, and occasionally uses the hycodan cough syrup if cough is severe enough. She also has had some generalized malaise and myalgias. She recently has had two UTIs, which she sees urology for, and had experience the malaise and myalgias with these as well. She's worried she may have another UTI so urology sent urine sample for culture today. She denies any fevers, chills, nausea, vomiting, anorexia, weight loss, night sweats, hemoptysis, leg swelling.   Allergies  Allergen Reactions   Bupropion Other (See Comments)    Body aches / headaches  Other reaction(s): Dizziness, Headache   Methimazole Other (See Comments)    Body goes into arthritic shock    Immunization History  Administered Date(s) Administered   Influenza Split 05/19/2012   Influenza Whole 06/19/2008   Influenza, Quadrivalent, Recombinant, Inj, Pf 05/15/2020   Influenza,inj,Quad PF,6+ Mos 04/30/2015, 04/12/2016, 06/23/2018, 05/25/2019, 08/28/2021   Influenza,inj,quad, With Preservative 05/15/2020   Influenza-Unspecified 04/27/2015, 04/30/2015, 04/12/2016, 04/25/2016, 06/23/2018, 05/25/2019, 05/15/2020   PFIZER Comirnaty(Gray Top)Covid-19 Tri-Sucrose Vaccine 01/26/2020, 03/08/2020   PFIZER(Purple Top)SARS-COV-2 Vaccination 01/26/2020, 03/08/2020, 05/15/2020   Td 07/13/2009    Past Medical History:  Diagnosis Date   Anxiety     Arthritis    Depression    Diabetes mellitus without complication (HCC)    Dyspnea    Fibromyalgia    GERD (gastroesophageal reflux disease)    Headache    migraines   Hypothyroidism 1999   post PTU Rx for hyperthyroidism   IBS (irritable bowel syndrome)    Interstitial lung disease ([place])    Pneumonia 06/2019   Double PNA    Tobacco History: Social History   Tobacco Use  Smoking Status Never  Smokeless Tobacco Never   Counseling given: Not Answered   Outpatient Medications Prior to Visit  Medication Sig Dispense Refill   acetaminophen (TYLENOL) 650 MG CR tablet Take 650 mg by mouth every 8 (eight) hours as needed for pain.     azaTHIOprine (IMURAN) 50 MG tablet TAKE 1 AND 1/2 TABLETS BY MOUTH DAILY 135 tablet 1   DULoxetine (CYMBALTA) 60 MG capsule TAKE 1 CAPSULE BY MOUTH EVERY DAY (Patient taking differently: Take 60 mg by mouth daily.) 90 capsule 2   ferrous sulfate 324 MG TBEC Take 324 mg by mouth.     HYDROcodone bit-homatropine (HYCODAN) 5-1.5 MG/5ML syrup Take 5 mLs by mouth every 6 (six) hours as needed for cough. 240 mL 0   hydroxychloroquine (PLAQUENIL) 200 MG tablet Take 200 mg by mouth daily.     ipratropium-albuterol (DUONEB) 0.5-2.5 (3) MG/3ML SOLN TAKE 3 MLS BY NEBULIZATION EVERY 6 (SIX) HOURS AS NEEDED. 180 mL 1   levalbuterol (XOPENEX HFA) 45 MCG/ACT inhaler Inhale 2 puffs into the lungs every 4 (four) hours as needed for wheezing. 1 each 12   levothyroxine (SYNTHROID) 125  MCG tablet Take 125 mcg by mouth daily before breakfast.     metFORMIN (GLUCOPHAGE-XR) 500 MG 24 hr tablet Take 500 mg by mouth in the morning and at bedtime. 1013m total daily     pantoprazole (PROTONIX) 40 MG tablet TAKE 1 TABLET BY MOUTH EVERY DAY 90 tablet 1   pregabalin (LYRICA) 150 MG capsule Take 1 capsule (150 mg total) by mouth 2 (two) times daily. (Patient taking differently: Take 150 mg by mouth in the morning, at noon, and at bedtime.) 60 capsule 3   albuterol (VENTOLIN HFA) 108  (90 Base) MCG/ACT inhaler Inhale 1-2 puffs into the lungs every 6 (six) hours as needed for wheezing or shortness of breath. 18 g 11   doxycycline (DORYX) 100 MG EC tablet Take 100 mg by mouth daily.     No facility-administered medications prior to visit.     Review of Systems:   Constitutional: No weight loss or gain, night sweats, fevers, chills. +fatigue, lassitude HEENT: No headaches, difficulty swallowing, tooth/dental problems, or sore throat. No sneezing, itching, ear ache, nasal congestion, or post nasal drip CV:  No chest pain, orthopnea, PND, swelling in lower extremities, anasarca, palpitations, syncope Resp: +shortness of breath with exertion; persistent, dry cough; chest tightness. No excess mucus or change in color of mucus. No hemoptysis. No wheezing.  No chest wall deformity GI:  No heartburn, indigestion, abdominal pain, nausea, vomiting, diarrhea, change in bowel habits, loss of appetite, bloody stools.  GU: No dysuria, change in color of urine, urgency or frequency.  No flank pain, no hematuria  Skin: No rash, lesions, ulcerations MSK:  + Generalized myalgias/arthralgias.  No joint swelling.  No decreased range of motion.  No back pain. Neuro: No dizziness or lightheadedness.  No cog changes or balance issues Psych: No depression or anxiety. Mood stable.     Physical Exam:  BP 116/72   Pulse 92   Temp 98 F (36.7 C) (Oral)   Ht _0  (1.702 m)   Wt 180 lb 9.6 oz (81.9 kg)   SpO2 97% Comment: on RA  BMI 28.29 kg/m   GEN: Pleasant, interactive, well-appearing; in no acute distress. HEENT:  Normocephalic and atraumatic. EACs patent bilaterally. TM pearly gray with present light reflex bilaterally. PERRLA. Sclera white. Nasal turbinates pink, moist and patent bilaterally. No rhinorrhea present. Oropharynx pink and moist, without exudate or edema. No lesions, ulcerations NECK:  Supple w/ fair ROM. No JVD present. Normal carotid impulses w/o bruits. Thyroid  symmetrical with no goiter or nodules palpated. No lymphadenopathy.   CV: RRR, no m/r/g, no peripheral edema. Pulses intact, +2 bilaterally. No cyanosis, pallor or clubbing. PULMONARY:  Unlabored, regular breathing. Minimal scattered rhonchi bilaterally posteriorly.  Bronchitic cough.. No accessory muscle use. No dullness to percussion. GI: BS present and normoactive. Soft, non-tender to palpation. No organomegaly or masses detected. No CVA tenderness. MSK: No erythema, warmth or tenderness. Cap refil <2 sec all extrem. No deformities or joint swelling noted.  Neuro: A/Ox3. No focal deficits noted.   Skin: Warm, no lesions or rashe Psych: Normal affect and behavior. Judgement and thought content appropriate.     Lab Results:  CBC    Component Value Date/Time   WBC 5.9 02/12/2022 1115   RBC 4.47 02/12/2022 1115   HGB 13.6 02/12/2022 1115   HCT 41.0 02/12/2022 1115   PLT 274.0 02/12/2022 1115   MCV 91.7 02/12/2022 1115   MCH 28.6 12/13/2019 0252   MCHC 33.1 02/12/2022 1115  RDW 12.9 02/12/2022 1115   LYMPHSABS 1.0 02/12/2022 1115   MONOABS 0.7 02/12/2022 1115   EOSABS 0.3 02/12/2022 1115   BASOSABS 0.0 02/12/2022 1115    BMET    Component Value Date/Time   NA 136 12/26/2021 0927   K 4.1 12/26/2021 0927   CL 98 12/26/2021 0927   CO2 30 12/26/2021 0927   GLUCOSE 137 (H) 12/26/2021 0927   BUN 11 12/26/2021 0927   CREATININE 0.73 12/26/2021 0927   CREATININE 0.75 03/25/2019 1610   CALCIUM 9.1 12/26/2021 0927   GFRNONAA >60 12/13/2019 0252   GFRAA >60 12/13/2019 0252    BNP No results found for: "BNP"   Imaging:  DG Chest 2 View  Result Date: 04/03/2022 CLINICAL DATA:  Short of breath, interstitial lung disease EXAM: CHEST - 2 VIEW COMPARISON:  10/10/2021 FINDINGS: Frontal and lateral views of the chest demonstrate a stable cardiac silhouette. There are chronic areas of scarring and fibrosis throughout the lungs, with patchy areas of superimposed airspace disease  within the lower lung zones. No effusion or pneumothorax. No acute bony abnormalities. IMPRESSION: 1. Patchy bibasilar airspace disease superimposed upon chronic scarring and fibrosis. This could reflect edema or infection. Electronically Signed   By: Randa Ngo M.D.   On: 04/03/2022 17:24    methylPREDNISolone acetate (DEPO-MEDROL) injection 80 mg     Date Action Dose Route User   04/03/2022 1713 Given 80 mg Intramuscular (Right Upper Outer Quadrant) Valerie Salts, CMA          Latest Ref Rng & Units 01/17/2021   11:02 AM 11/16/2020    3:05 PM 04/02/2020    3:35 PM  PFT Results  FVC-Pre L 2.31  2.15  2.78   FVC-Predicted Pre % 59  55  71   FVC-Post L 2.38  2.14    FVC-Predicted Post % 61  55    Pre FEV1/FVC % % 89  90  88   Post FEV1/FCV % % 89  91    FEV1-Pre L 2.06  1.95  2.45   FEV1-Predicted Pre % 66  62  78   FEV1-Post L 2.12  1.95    DLCO uncorrected ml/min/mmHg 14.60  16.02  17.38   DLCO UNC% % 63  69  75   DLCO corrected ml/min/mmHg 14.60  16.02  17.38   DLCO COR %Predicted % 63  69  75   DLVA Predicted % 103  112  105   TLC L 3.86  2.24    TLC % Predicted % 72  41    RV % Predicted % 80  -1      Lab Results  Component Value Date   NITRICOXIDE 35 04/03/2022        Assessment & Plan:   Reactive airway disease She has had a recent persistent cough that seems to be brought on by URI.  She was COVID-negative today.  No previous formal diagnosis of asthma.  However, she does have mid flow reversibility on past pulmonary function testing and has very good response to albuterol.  She is also recently had CBC that showed elevated eosinophils.  She also has some evidence of bronchospasm on exam today.  Concerned that she might have a reactive airway/asthmatic component to her cough and symptoms. FeNO checked which was elevated.  We we will treat her with Depo 80 mg injection x1 and prednisone taper.  Restart ICS/LABA therapy -Rx for Symbicort placed.  Continue as  needed albuterol.  Cough  control measures advised.  CXR to rule out superimposed infection.  Patient Instructions  Continue albuterol inhaler 2 puffs or duoneb 3 mL neb every 6 hours as needed for shortness of breath or wheezing. Notify if symptoms persist despite rescue inhaler/neb use.  Continue Imuran 150 mg daily  Continue protonix 40 mg daily  Continue hycodan cough syrup 5 mL every 6 hours as needed for cough  Start Symbicort 2 puffs Twice daily. Brush tongue and rinse mouth afterwards Tessalon perles 1 capsule Three times a day for cough until cough improves Chlorpheniramine tab 4 mg over the counter At bedtime for cough Prednisone taper. 4 tabs for 2 days, then 3 tabs for 2 days, 2 tabs for 2 days, then 1 tab for 2 days, then stop. Start tomorrow. Take in AM with food  Chest x ray today.  Follow up in two weeks with Dr. Vaughan Browner or Alanson Aly. If symptoms do not improve or worsen, please contact office for sooner follow up or seek emergency care.     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  She is followed by Dr. Maceo Pro at [facility].  Recently increased Imuran to maximum dose of 150 mg daily.  She is tolerating this well so far.  Symptoms do not appear to correlate with timing of increased dosing.  They are going to consider adding on antifibrotics down the line.  See above plan.  Follow-up with Dr. Sarajane Jews to scheduled.    I spent 35 of dedicated to the care of this patient on the date of this encounter to include pre-visit review of records, face-to-face time with the patient discussing conditions above, post visit ordering of testing, clinical documentation with the electronic health record, making appropriate referrals as documented, and communicating necessary findings to members of the patients care team.  Clayton Bibles,  NP 04/04/2022  Pt aware and understands NP's role.

## 2022-04-03 NOTE — Patient Instructions (Addendum)
Continue albuterol inhaler 2 puffs or duoneb 3 mL neb every 6 hours as needed for shortness of breath or wheezing. Notify if symptoms persist despite rescue inhaler/neb use.  Continue Imuran 150 mg daily  Continue protonix 40 mg daily  Continue hycodan cough syrup 5 mL every 6 hours as needed for cough  Start Symbicort 2 puffs Twice daily. Brush tongue and rinse mouth afterwards Tessalon perles 1 capsule Three times a day for cough until cough improves Chlorpheniramine tab 4 mg over the counter At bedtime for cough Prednisone taper. 4 tabs for 2 days, then 3 tabs for 2 days, 2 tabs for 2 days, then 1 tab for 2 days, then stop. Start tomorrow. Take in AM with food  Chest x ray today.  Follow up in two weeks with [name] or Philis Nettle. If symptoms do not improve or worsen, please contact office for sooner follow up or seek emergency care.

## 2022-04-03 NOTE — Progress Notes (Signed)
Addendum 04/03/2022: CXR results reviewed which show patchy opacities in bilateral lower lobes, concerning for pneumonia. Called patient and left VM notifying of results; ok per chart. Rx for augmentin 1 tab Twice daily for 7 days. Instructed to take with food. Take daily probiotic while taking. Plans to see her in 2 weeks for close follow up and repeat CXR in 4-6 weeks after completing abx.

## 2022-04-04 ENCOUNTER — Encounter: Payer: Self-pay | Admitting: Nurse Practitioner

## 2022-04-04 DIAGNOSIS — J45909 Unspecified asthma, uncomplicated: Secondary | ICD-10-CM | POA: Insufficient documentation

## 2022-04-04 NOTE — Assessment & Plan Note (Signed)
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  She is followed by Dr. Foy Guadalajara at [facility].  Recently increased Imuran to maximum dose of 150 mg daily.  She is tolerating this well so far.  Symptoms do not appear to correlate with timing of increased dosing.  They are going to consider adding on antifibrotics down the line.  See above plan.  Follow-up with Dr. Clent Ridges to scheduled.

## 2022-04-04 NOTE — Assessment & Plan Note (Signed)
She has had a recent persistent cough that seems to be brought on by URI.  She was COVID-negative today.  No previous formal diagnosis of asthma.  However, she does have mid flow reversibility on past pulmonary function testing and has very good response to albuterol.  She is also recently had CBC that showed elevated eosinophils.  She also has some evidence of bronchospasm on exam today.  Concerned that she might have a reactive airway/asthmatic component to her cough and symptoms. FeNO checked which was elevated.  We we will treat her with Depo 80 mg injection x1 and prednisone taper.  Restart ICS/LABA therapy -Rx for Symbicort placed.  Continue as needed albuterol.  Cough control measures advised.  CXR to rule out superimposed infection.  Patient Instructions  Continue albuterol inhaler 2 puffs or duoneb 3 mL neb every 6 hours as needed for shortness of breath or wheezing. Notify if symptoms persist despite rescue inhaler/neb use.  Continue Imuran 150 mg daily  Continue protonix 40 mg daily  Continue hycodan cough syrup 5 mL every 6 hours as needed for cough  Start Symbicort 2 puffs Twice daily. Brush tongue and rinse mouth afterwards Tessalon perles 1 capsule Three times a day for cough until cough improves Chlorpheniramine tab 4 mg over the counter At bedtime for cough Prednisone taper. 4 tabs for 2 days, then 3 tabs for 2 days, 2 tabs for 2 days, then 1 tab for 2 days, then stop. Start tomorrow. Take in AM with food  Chest x ray today.  Follow up in two weeks with [name] or Philis Nettle. If symptoms do not improve or worsen, please contact office for sooner follow up or seek emergency care.

## 2022-04-11 ENCOUNTER — Other Ambulatory Visit: Payer: Self-pay | Admitting: Nurse Practitioner

## 2022-04-11 DIAGNOSIS — J189 Pneumonia, unspecified organism: Secondary | ICD-10-CM

## 2022-04-11 MED ORDER — AMOXICILLIN-POT CLAVULANATE 875-125 MG PO TABS: 1.0000 | ORAL_TABLET | Freq: Two times a day (BID) | ORAL | 0 refills | Status: AC

## 2022-04-11 NOTE — Telephone Encounter (Signed)
Yes, that could be why. Please start taking it twice daily (AM and PM) with food. I am going to extend your abx for an additional five days so finish what you have and then complete the additional 5 days. Did she complete the prednisone? If so, did she feel better while she was on it?

## 2022-04-11 NOTE — Progress Notes (Signed)
Extended augmentin 5 days.

## 2022-04-22 ENCOUNTER — Encounter: Payer: Self-pay | Admitting: Nurse Practitioner

## 2022-04-22 ENCOUNTER — Ambulatory Visit (INDEPENDENT_AMBULATORY_CARE_PROVIDER_SITE_OTHER): Payer: 59 | Admitting: Nurse Practitioner

## 2022-04-22 VITALS — BP 116/64 | HR 84 | Temp 97.8°F | Ht 67.0 in | Wt 183.0 lb

## 2022-04-22 DIAGNOSIS — J4541 Moderate persistent asthma with (acute) exacerbation: Secondary | ICD-10-CM | POA: Diagnosis not present

## 2022-04-22 DIAGNOSIS — J849 Interstitial pulmonary disease, unspecified: Secondary | ICD-10-CM

## 2022-04-22 DIAGNOSIS — J189 Pneumonia, unspecified organism: Secondary | ICD-10-CM

## 2022-04-22 LAB — COMPREHENSIVE METABOLIC PANEL
ALT: 10 U/L (ref 0–35)
AST: 16 U/L (ref 0–37)
Albumin: 4.2 g/dL (ref 3.5–5.2)
Alkaline Phosphatase: 74 U/L (ref 39–117)
BUN: 11 mg/dL (ref 6–23)
CO2: 29 mEq/L (ref 19–32)
Calcium: 9.3 mg/dL (ref 8.4–10.5)
Chloride: 101 mEq/L (ref 96–112)
Creatinine, Ser: 0.7 mg/dL (ref 0.40–1.20)
GFR: 103.34 mL/min (ref >60.00)
Glucose, Bld: 125 mg/dL — ABNORMAL HIGH (ref 70–99)
Potassium: 3.7 mEq/L (ref 3.5–5.1)
Sodium: 138 mEq/L (ref 135–145)
Total Bilirubin: 0.4 mg/dL (ref 0.2–1.2)
Total Protein: 7.2 g/dL (ref 6.0–8.3)

## 2022-04-22 LAB — NITRIC OXIDE: Nitric Oxide: 10

## 2022-04-22 NOTE — Progress Notes (Signed)
_0  ID: [name], female    DOB: 17-Dec-1975, 46 y.o.   MRN: [id number]  Chief Complaint  Patient presents with   Follow-up    She is still having some dry cough, some shortness of breath and wheezing.     Referring provider: Sueanne Margarita, DO  HPI: 46 year old female, never smoker followed for ILD with hypersensitivity pneumonitis, post COVID ILD with long-haul syndrome. She is a patient of Dr. Vaughan Carter and last seen 04/03/2022 by Crystal Cruise NP. Past medical history significant for allergic rhinitis, hypothyroidism, PCO, depression with anxiety, fibromyalgia, IBS.  Initially started complaining of increasing dyspnea with chest pressure in mid August 2020. CTA was obtained by her PCP without evidence of embolism but showed b/l diffuse pulmonary infiltrates. COVID 19 was negative. 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 Per the patient, she had an unspecified autoimmune disease and was referred to Dr. Trudie Carter, rheumatology, with negative work up.   She underwent bronchoscopy on 07/12/2019 with findings of lymphocytosis on BAL and granulomas. Her case was discussed at multidisciplinary conference on 08/02/2019 with diagnosis of hypersensitivity pneumonitis d/t down exposure and started on prednisone 40 mg daily with slow taper to be off by early Jan 2021. She contracted COVID 09/07/2019, with worsening symptoms, and received monoclonal antibody therapy. She was restarted on prednisone post COVID and tried to taper off; however, she was noted to have persistent symptoms with evolving ILD on CT chest. Prednisone was increased back to 40 mg daily with biopsy on 12/12/2019.   Completed pulmonary rehab end of 2021 and eventually tapered off steroids in 2021.   Has followed with 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   Presented in March 2022 with worsening dyspnea, chest tightness. Trialed prednisone 40 mg daily for 1 month to evaluated for any improvement. No change in 6 minute walk, CT, and PFTs. Overall, felt as though prednisone was not helping and tapered off.   TEST/EVENTS:  Bronchoscopy 07/12/2019 WBC 510, lymphocytes 38%, eos 12%, Microbiology-negative Cytology -no malignancy Transbronchial biopsy-nonnecrotizing epithelioid granulomas SARS-CoV-2, viral PCR-negative   Surgical lung biopsy 12/12/2019- features suggestive of chronic hypersensitivity pneumonitis with heterogeneous fibrosis and superimposed subacute lung injury.  01/17/2021 PFTs: FVC 2.38 (61), FEV1 2.12 (68), ratio 89, TLC 72%, DLCOunc 63% 07/05/2021 HRCT chest: redemonstrated moderate IPF, in apical to basal gradient pattern, with irregular peripheral interstitial opacity, septal thickening, extensive ground-glass, and areas of subpleural bronchiolectasis at lung bases. Fibrotic findings are appreciably worsened when compared to previous exam, with increase in ground glass. S/p right lung wedge resection.  10/17/2021 CTA chest: No PE, pulmonary fibrosis with groundglass opacity 03/10/2022 eos 440  04/03/2022 CXR: Patchy bibasilar airspace disease superimposed upon chronic scarring and fibrosis. 04/03/2022 FeNO 35 ppb  02/11/2022: Virtual visit with Dr. Vaughan Carter.  Started on Lueders in January 2023.  She was treated with Levaquin course for infiltrates on CXR in February 2023.  CTA was obtained for persistent DOE and was negative for PE.  Increased Imuran to 75 mg in April 2023 but she developed diffuse body aches and was dropped back down to 50 mg/day.  Has since followed up with Dr. Maceo Carter at [facility] for reassessment to order repeat autoimmune profile showing elevated ANA and aldolase.  She was seen by Dr. Amil Carter rheumatology who noted no evidence of autoimmune  disease.  04/03/2022: OV with Crystal Merriott NP for acute visit. Since I saw her last, she was seen by Dr. Vaughan [facility] on 6/20 and by Dr[facility] M[facility]o [facility] at [facility] on 7/17 who repeated her pulmonary function testing, which was declined when compared to previous. Imuran was slowly increased to max dose of 150 mg, which she started yesterday. They will likely initiate antifibrotic therapy if she is able to tolerate full dose of Imuran. She was also referred to pulmonary rehab. Today, she reports that over the past week, she has developed worsening of her cough. It has caused significant chest discomfort and is paroxysmal. She has shortness of breath with coughing spells. She has been using her albuterol inhaler, which does provide some relief, and occasionally uses the hycodan cough syrup if cough is severe enough. She also has had some generalized malaise and myalgias. She recently has had two UTIs, which she sees urology for, and had experience the malaise and myalgias with these as well. She's worried she may have another UTI so urology sent urine sample for culture today. She denies any fevers, chills, nausea, vomiting, anorexia, weight loss, night sweats, hemoptysis, leg swelling.  She has had a recent persistent cough that seems to be brought on by URI.  She was COVID-negative today.  No previous formal diagnosis of asthma.  However, she does have mid flow reversibility on past pulmonary function testing and has very good response to albuterol.  She is also recently had CBC that showed elevated eosinophils.  She has some evidence of bronchospasm on exam today.  Concerned that she might have a reactive airway/asthmatic component to her cough and symptoms. FeNO checked which was elevated.  We we will treat her with Depo 80 mg injection x1 and prednisone taper.  Restart ICS/LABA therapy -Rx for Symbicort placed.  Continue as needed albuterol.  Cough control measures advised.  CXR to rule out superimposed infection - new patchy airspace  disease upon chronic changes. Treated with augmentin 7 day course for possible pna.   04/22/2022: Today - follow up Patient presents today with her husband for follow up after being treated for pneumonia and bronchitis with augmentin course and steroids. She had some confusion surrounding her abx and was only taking one pill a day. She initially wasn't feeling better so she rechecked her bottle and realized she was supposed to be taking it twice a day. She called the office on 8/18 to notify of this and we extended her by an additional 5 days. She reports that after this, which she completed on Friday, she started to notice a big difference and is finally starting to feel better. She feels like she has less congestion and fatigue is improving. She still has a dry cough, which is slightly improved. She denies any fevers, chills, anorexia, hemoptysis, orthopnea. She started using the symbicort and does feel like she notices a difference in her breathing with it. She continues on Imuran 150 mg daily.  Allergies  Allergen Reactions   Bupropion Other (See Comments)    Body aches / headaches  Other reaction(s): Dizziness, Headache   Methimazole Other (See Comments)    Body goes into arthritic shock    Immunization History  Administered Date(s) Administered   Influenza Split 05/19/2012   Influenza Whole 06/19/2008   Influenza, Quadrivalent, Recombinant, Inj, Pf 05/15/2020   Influenza,inj,Quad PF,6+ Mos 04/30/2015, 04/12/2016, 06/23/2018, 05/25/2019, 08/28/2021   Influenza,inj,quad, With Preservative  05/15/2020   Influenza-Unspecified 04/27/2015, 04/30/2015, 04/12/2016, 04/25/2016, 06/23/2018, 05/25/2019, 05/15/2020, 08/30/2021   PFIZER Comirnaty(Gray Top)Covid-19 Tri-Sucrose Vaccine 01/26/2020, 03/08/2020   PFIZER(Purple Top)SARS-COV-2 Vaccination 01/26/2020, 03/08/2020, 05/15/2020   PNEUMOCOCCAL CONJUGATE-20 03/10/2022   Td 07/13/2009    Past Medical History:  Diagnosis Date   Anxiety     Arthritis    Depression    Diabetes mellitus without complication (HCC)    Dyspnea    Fibromyalgia    GERD (gastroesophageal reflux disease)    Headache    migraines   Hypothyroidism 1999   post PTU Rx for hyperthyroidism   IBS (irritable bowel syndrome)    Interstitial lung disease ([place])    Pneumonia 06/2019   Double PNA    Tobacco History: Social History   Tobacco Use  Smoking Status Never  Smokeless Tobacco Never   Counseling given: Not Answered   Outpatient Medications Prior to Visit  Medication Sig Dispense Refill   acetaminophen (TYLENOL) 650 MG CR tablet Take 650 mg by mouth every 8 (eight) hours as needed for pain.     azaTHIOprine (IMURAN) 50 MG tablet TAKE 1 AND 1/2 TABLETS BY MOUTH DAILY 135 tablet 1   benzonatate (TESSALON) 100 MG capsule Take 1 capsule (100 mg total) by mouth 3 (three) times daily as needed for cough. 30 capsule 1   budesonide-formoterol (SYMBICORT) 160-4.5 MCG/ACT inhaler Inhale 2 puffs into the lungs in the morning and at bedtime. 1 each 12   DULoxetine (CYMBALTA) 60 MG capsule TAKE 1 CAPSULE BY MOUTH EVERY DAY (Patient taking differently: Take 60 mg by mouth daily.) 90 capsule 2   ferrous sulfate 324 MG TBEC Take 324 mg by mouth.     HYDROcodone bit-homatropine (HYCODAN) 5-1.5 MG/5ML syrup Take 5 mLs by mouth every 6 (six) hours as needed for cough. 240 mL 0   hydroxychloroquine (PLAQUENIL) 200 MG tablet Take 200 mg by mouth daily.     ipratropium-albuterol (DUONEB) 0.5-2.5 (3) MG/3ML SOLN TAKE 3 MLS BY NEBULIZATION EVERY 6 (SIX) HOURS AS NEEDED. 180 mL 1   levalbuterol (XOPENEX HFA) 45 MCG/ACT inhaler Inhale 2 puffs into the lungs every 4 (four) hours as needed for wheezing. 1 each 12   levothyroxine (SYNTHROID) 125 MCG tablet Take 125 mcg by mouth daily before breakfast.     metFORMIN (GLUCOPHAGE-XR) 500 MG 24 hr tablet Take 500 mg by mouth in the morning and at bedtime. 1016m total daily     pantoprazole (PROTONIX) 40 MG tablet TAKE 1  TABLET BY MOUTH EVERY DAY 90 tablet 1   pregabalin (LYRICA) 150 MG capsule Take 1 capsule (150 mg total) by mouth 2 (two) times daily. (Patient taking differently: Take 150 mg by mouth in the morning, at noon, and at bedtime.) 60 capsule 3   predniSONE (DELTASONE) 10 MG tablet 4 tabs for 2 days, then 3 tabs for 2 days, 2 tabs for 2 days, then 1 tab for 2 days, then stop 20 tablet 0   No facility-administered medications prior to visit.     Review of Systems:   Constitutional: No weight loss or gain, night sweats, fevers, chills. +fatigue, lassitude (improving) HEENT: No headaches, difficulty swallowing, tooth/dental problems, or sore throat. No sneezing, itching, ear ache, nasal congestion, or post nasal drip CV:  No chest pain, orthopnea, PND, swelling in lower extremities, anasarca, palpitations, syncope Resp: +shortness of breath with exertion (improved); persistent, dry cough (slowly improving). No excess mucus or change in color of mucus. No hemoptysis. No wheezing.  No chest  wall deformity GI:  No heartburn, indigestion, abdominal pain, nausea, vomiting, diarrhea, change in bowel habits, loss of appetite, bloody stools.  GU: No dysuria, change in color of urine, urgency or frequency.  No flank pain, no hematuria  Skin: No rash, lesions, ulcerations MSK:   No joint swelling.  No decreased range of motion.  No back pain. Neuro: No dizziness or lightheadedness.  No cog changes or balance issues Psych: No depression or anxiety. Mood stable.     Physical Exam:  BP 116/64 (BP Location: Left Arm, Cuff Size: Normal)   Pulse 84   Temp 97.8 F (36.6 C) (Oral)   Ht _0  (1.702 m)   Wt 183 lb (83 kg)   SpO2 97%   BMI 28.66 kg/m   GEN: Pleasant, interactive, well-appearing; in no acute distress. HEENT:  Normocephalic and atraumatic. EACs patent bilaterally. TM pearly gray with present light reflex bilaterally. PERRLA. Sclera white. Nasal turbinates pink, moist and patent bilaterally. No  rhinorrhea present. Oropharynx pink and moist, without exudate or edema. No lesions, ulcerations NECK:  Supple w/ fair ROM. No JVD present. Normal carotid impulses w/o bruits. Thyroid symmetrical with no goiter or nodules palpated. No lymphadenopathy.   CV: RRR, no m/r/g, no peripheral edema. Pulses intact, +2 bilaterally. No cyanosis, pallor or clubbing. PULMONARY:  Unlabored, regular breathing. Clear bilaterally A&P w/o wheezes/rales/rhonchi. Bronchitic cough.. No accessory muscle use. No dullness to percussion. GI: BS present and normoactive. Soft, non-tender to palpation. No organomegaly or masses detected. No CVA tenderness. MSK: No erythema, warmth or tenderness. Cap refil <2 sec all extrem. No deformities or joint swelling noted.  Neuro: A/Ox3. No focal deficits noted.   Skin: Warm, no lesions or rashe Psych: Normal affect and behavior. Judgement and thought content appropriate.     Lab Results:  CBC    Component Value Date/Time   WBC 5.9 02/12/2022 1115   RBC 4.47 02/12/2022 1115   HGB 13.6 02/12/2022 1115   HCT 41.0 02/12/2022 1115   PLT 274.0 02/12/2022 1115   MCV 91.7 02/12/2022 1115   MCH 28.6 12/13/2019 0252   MCHC 33.1 02/12/2022 1115   RDW 12.9 02/12/2022 1115   LYMPHSABS 1.0 02/12/2022 1115   MONOABS 0.7 02/12/2022 1115   EOSABS 0.3 02/12/2022 1115   BASOSABS 0.0 02/12/2022 1115    BMET    Component Value Date/Time   NA 136 12/26/2021 0927   K 4.1 12/26/2021 0927   CL 98 12/26/2021 0927   CO2 30 12/26/2021 0927   GLUCOSE 137 (H) 12/26/2021 0927   BUN 11 12/26/2021 0927   CREATININE 0.73 12/26/2021 0927   CREATININE 0.75 03/25/2019 1610   CALCIUM 9.1 12/26/2021 0927   GFRNONAA >60 12/13/2019 0252   GFRAA >60 12/13/2019 0252    BNP No results found for: "BNP"   Imaging:  LONG TERM MONITOR (3-14 DAYS)  Result Date: 04/07/2022 Patch Wear Time:  6 days and 21 hours (2023-07-31T22:34:40-0400 to 2023-08-07T20:04:44-0400) Patient had a min HR of 59 bpm,  max HR of 150 bpm, and avg HR of 90 bpm. Predominant underlying rhythm was Sinus Rhythm. Isolated SVEs were rare (<1.0%), SVE Couplets were rare (<1.0%), and SVE Triplets were rare (<1.0%). Isolated VEs were rare (<1.0%), VE Couplets were rare (<1.0%), and no VE Triplets were present. Sinus bradycardia, normal sinus rhythm, sinus tachycardia, rare PAC and PVC and rare couplet. Kirk Ruths  DG Chest 2 View  Result Date: 04/03/2022 CLINICAL DATA:  Short of breath, interstitial lung disease EXAM:  CHEST - 2 VIEW COMPARISON:  10/10/2021 FINDINGS: Frontal and lateral views of the chest demonstrate a stable cardiac silhouette. There are chronic areas of scarring and fibrosis throughout the lungs, with patchy areas of superimposed airspace disease within the lower lung zones. No effusion or pneumothorax. No acute bony abnormalities. IMPRESSION: 1. Patchy bibasilar airspace disease superimposed upon chronic scarring and fibrosis. This could reflect edema or infection. Electronically Signed   By: Randa Ngo M.D.   On: 04/03/2022 17:24    methylPREDNISolone acetate (DEPO-MEDROL) injection 80 mg     Date Action Dose Route User   04/03/2022 1713 Given 80 mg Intramuscular (Right Upper Outer Quadrant) Valerie Salts, CMA          Latest Ref Rng & Units 01/17/2021   11:02 AM 11/16/2020    3:05 PM 04/02/2020    3:35 PM  PFT Results  FVC-Pre L 2.31  2.15  2.78   FVC-Predicted Pre % 59  55  71   FVC-Post L 2.38  2.14    FVC-Predicted Post % 61  55    Pre FEV1/FVC % % 89  90  88   Post FEV1/FCV % % 89  91    FEV1-Pre L 2.06  1.95  2.45   FEV1-Predicted Pre % 66  62  78   FEV1-Post L 2.12  1.95    DLCO uncorrected ml/min/mmHg 14.60  16.02  17.38   DLCO UNC% % 63  69  75   DLCO corrected ml/min/mmHg 14.60  16.02  17.38   DLCO COR %Predicted % 63  69  75   DLVA Predicted % 103  112  105   TLC L 3.86  2.24    TLC % Predicted % 72  41    RV % Predicted % 80  -1      Lab Results  Component Value  Date   NITRICOXIDE 10 04/22/2022        Assessment & Plan:   CAP (community acquired pneumonia) Clinically improving. She is still not entirely back to her baseline but just finished augmentin course on Friday. Continue with supportive care measures. We will check CXR in 6 weeks to assess for resolution. Strict return precautions.  Patient Instructions  Continue albuterol inhaler 2 puffs or duoneb 3 mL neb every 6 hours as needed for shortness of breath or wheezing. Notify if symptoms persist despite rescue inhaler/neb use.  Continue Imuran 150 mg daily  Continue protonix 40 mg daily  Continue hycodan cough syrup 5 mL every 6 hours as needed for cough Continue Symbicort 2 puffs Twice daily. Brush tongue and rinse mouth afterwards Continue tessalon perles 1 capsule Three times a day for cough until cough improves Continue chlorpheniramine tab 4 mg over the counter At bedtime for cough   Follow up in six weeks with Dr. Vaughan Carter or Alanson Aly with repeat chest x ray. If symptoms do not improve or worsen, please contact office for sooner follow up or seek emergency care.     Reactive airway disease Slowly resolving exacerbation. FeNO normalized today and lung exam improved. She still has a dry cough. Encouraged to continue supportive care measures/cough control measures. She has had good benefit from Symbicort so we will have her continue this Twice daily as well as prn albuterol.  ILD (interstitial lung disease) ([place]) Possible flare r/t CAP. Improved with abx and steroids. 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  She is followed by [facility] at [facility].  Recently increased Imuran to maximum dose of 150 mg daily.  She is tolerating this well so far.  They are going to consider adding on antifibrotics down the line.  See above plan.  Follow-up with Dr. Sarajane Jews to scheduled.     I spent  28 of dedicated to the care of this patient on the date of this encounter to include pre-visit review of records, face-to-face time with the patient discussing conditions above, post visit ordering of testing, clinical documentation with the electronic health record, making appropriate referrals as documented, and communicating necessary findings to members of the patients care team.  Clayton Bibles, NP 04/22/2022  Pt aware and understands NP's role.

## 2022-04-22 NOTE — Assessment & Plan Note (Signed)
Clinically improving. She is still not entirely back to her baseline but just finished augmentin course on Friday. Continue with supportive care measures. We will check CXR in 6 weeks to assess for resolution. Strict return precautions.  Patient Instructions  Continue albuterol inhaler 2 puffs or duoneb 3 mL neb every 6 hours as needed for shortness of breath or wheezing. Notify if symptoms persist despite rescue inhaler/neb use.  Continue Imuran 150 mg daily  Continue protonix 40 mg daily  Continue hycodan cough syrup 5 mL every 6 hours as needed for cough Continue Symbicort 2 puffs Twice daily. Brush tongue and rinse mouth afterwards Continue tessalon perles 1 capsule Three times a day for cough until cough improves Continue chlorpheniramine tab 4 mg over the counter At bedtime for cough   Follow up in six weeks with [name] or Philis Nettle with repeat chest x ray. If symptoms do not improve or worsen, please contact office for sooner follow up or seek emergency care.

## 2022-04-22 NOTE — Assessment & Plan Note (Signed)
Slowly resolving exacerbation. FeNO normalized today and lung exam improved. She still has a dry cough. Encouraged to continue supportive care measures/cough control measures. She has had good benefit from Symbicort so we will have her continue this Twice daily as well as prn albuterol.

## 2022-04-22 NOTE — Assessment & Plan Note (Signed)
Possible flare r/t CAP. Improved with abx and steroids. 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  She is followed by Dr. Foy Guadalajara at [facility].  Recently increased Imuran to maximum dose of 150 mg daily.  She is tolerating this well so far.  They are going to consider adding on antifibrotics down the line.  See above plan.  Follow-up with Dr. Clent Ridges to scheduled.

## 2022-04-22 NOTE — Patient Instructions (Addendum)
Continue albuterol inhaler 2 puffs or duoneb 3 mL neb every 6 hours as needed for shortness of breath or wheezing. Notify if symptoms persist despite rescue inhaler/neb use.  Continue Imuran 150 mg daily  Continue protonix 40 mg daily  Continue hycodan cough syrup 5 mL every 6 hours as needed for cough Continue Symbicort 2 puffs Twice daily. Brush tongue and rinse mouth afterwards Continue tessalon perles 1 capsule Three times a day for cough until cough improves Continue chlorpheniramine tab 4 mg over the counter At bedtime for cough   Follow up in six weeks with [name] or Philis Nettle with repeat chest x ray. If symptoms do not improve or worsen, please contact office for sooner follow up or seek emergency care.

## 2022-04-30 ENCOUNTER — Other Ambulatory Visit: Payer: Self-pay | Admitting: Pulmonary Disease

## 2022-05-09 ENCOUNTER — Other Ambulatory Visit: Payer: Self-pay | Admitting: Nurse Practitioner

## 2022-05-09 DIAGNOSIS — J4541 Moderate persistent asthma with (acute) exacerbation: Secondary | ICD-10-CM

## 2022-05-09 MED ORDER — HYDROCODONE BIT-HOMATROP MBR 5-1.5 MG/5ML PO SOLN: 5.0000 mL | Freq: Four times a day (QID) | ORAL | 0 refills | Status: DC | PRN

## 2022-05-09 NOTE — Telephone Encounter (Signed)
Pt is calling in again stating her Imuran doesn't have the correct qty on the rx. Please advise   Pharmacy: [facility] [address]

## 2022-05-09 NOTE — Telephone Encounter (Signed)
Dr. Mannam, please see mychart message sent by pt and advise. 

## 2022-05-22 NOTE — Telephone Encounter (Signed)
New mychart message sent by pt: [name]  [facility] (supporting Marland Kitchen V, NP) 22 hours ago (6:35 PM)    To date symptoms are steadily the same. Breathing is very crackly, raspy. I'm very exhausted & have been struggling with body aches, since Saturday on/off.    Coughing persistent throughout day & night, especially if I talk a little or a lot. A bit of insomnia & tension headaches almost every morning.    I know I have follow up w/ [facility] 10/31..? I think. But not sure if I should have a repeat X-ray or CT scan prior. Especially since coughing & all is still present.    Labs? When should I come back for my 30-day labs?    Thank you.  [name]     Routing to Dr. Vaughan Browner for review. Please advise.

## 2022-05-23 NOTE — Telephone Encounter (Signed)
Noted.  [name] asked for a recall in 6 weeks at clinic visit on 8/29. Can you please ensure that she has a follow with me or APP. Thanks.

## 2022-05-28 ENCOUNTER — Encounter: Payer: Self-pay | Admitting: Nurse Practitioner

## 2022-05-28 ENCOUNTER — Ambulatory Visit (INDEPENDENT_AMBULATORY_CARE_PROVIDER_SITE_OTHER): Payer: 59

## 2022-05-28 ENCOUNTER — Ambulatory Visit (INDEPENDENT_AMBULATORY_CARE_PROVIDER_SITE_OTHER): Payer: 59 | Admitting: Nurse Practitioner

## 2022-05-28 VITALS — BP 110/80 | HR 95 | Ht 65.0 in | Wt 186.2 lb

## 2022-05-28 DIAGNOSIS — J849 Interstitial pulmonary disease, unspecified: Secondary | ICD-10-CM

## 2022-05-28 DIAGNOSIS — J189 Pneumonia, unspecified organism: Secondary | ICD-10-CM

## 2022-05-28 DIAGNOSIS — J8283 Eosinophilic asthma: Secondary | ICD-10-CM

## 2022-05-28 LAB — POCT EXHALED NITRIC OXIDE: FeNO level (ppb): 11

## 2022-05-28 MED ORDER — PREDNISONE 10 MG PO TABS: 10.0000 mg | ORAL_TABLET | Freq: Every day | ORAL | 0 refills | Status: DC

## 2022-05-28 MED ORDER — BENZONATATE 100 MG PO CAPS: ORAL_CAPSULE | ORAL | 3 refills | Status: DC

## 2022-05-28 MED ORDER — PREDNISONE 10 MG PO TABS: ORAL_TABLET | ORAL | 0 refills | Status: DC

## 2022-05-28 NOTE — Assessment & Plan Note (Signed)
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 She is followed by Dr. Maceo Pro at [facility]. Currently on max dose of Imuran aT 150 mg daily. She is tolerating this well so far. They are going to consider adding on antifibroticsdown the line. Follow-up with Dr. Josph Macho as scheduled.

## 2022-05-28 NOTE — Assessment & Plan Note (Addendum)
Constellation of symptoms and findings of elevated eosinophils (440) with previous elevated exhaled nitric oxide (35 ppb), consistent with eosinophilic asthma. She has had better response recently to steroids and ICS/LABA inhaler than she has in the past. Discussed initiating biologic therapy for further management as she is still poorly controlled despite high dose ICS/LABA with Dr. Vaughan Browner. Shared decision with patient and her husband. We will start her on biologic therapy with Dupixent. In the interim, start low dose daily prednisone. Continue high dose ICS/LABA therapy.   Patient Instructions  Continue albuterol inhaler 2 puffs or duoneb 3 mL neb every 6 hours as needed for shortness of breath or wheezing. Notify if symptoms persist despite rescue inhaler/neb use. Try to use your neb instead of your inhaler if you are having a coughing spell.  Continue Imuran 150 mg daily  Continue protonix 40 mg daily  Continue hycodan cough syrup 5 mL every 6 hours as needed for cough Continue Symbicort 2 puffs Twice daily. Brush tongue and rinse mouth afterwards Continue tessalon perles 1 capsule Three times a day for cough until cough improves Continue chlorpheniramine tab 4 mg over the counter At bedtime for cough  Start prednisone 10 mg daily for the next two weeks then try to decrease to 5 mg daily. If you notice that your breathing or cough is worse after the decreased dose, you can go back to 10 mg. Take in AM with food  Start Dupixent injections every 2 weeks - pharmacy team will contact you regarding next steps   Suck on sugar free hard candies or non-menthol cough drops throughout the day. Frequent sips of water.    Follow up in two weeks with Dr. Vaughan Browner or Alanson Aly. If symptoms do not improve or worsen, please contact office for sooner follow up or seek emergency care.

## 2022-05-28 NOTE — Assessment & Plan Note (Signed)
Superimposed b/l patchy infiltrates in lower lobes 8/10; treated with augmentin course. CXR shows resolution today with stable fibrotic changes.

## 2022-05-28 NOTE — Progress Notes (Unsigned)
_0  ID: [name], female    DOB: 10/24/1975, 46 y.o.   MRN: [id number]  Chief Complaint  Patient presents with   Follow-up    Pt f/u she is coughing more than usual (exertion and talking cause it more). Increased SOB and wheezing.     Referring provider: Sueanne Margarita, DO  HPI: 46 year old female, never smoker followed for ILD with hypersensitivity pneumonitis, post COVID ILD with long-haul syndrome, and reactive airway disease. She is a patient of Dr. Vaughan Browner and last seen 04/22/2022 by [name] NP. Past medical history significant for allergic rhinitis, hypothyroidism, PCO, depression with anxiety, fibromyalgia, IBS.  Initially started complaining of increasing dyspnea with chest pressure in mid August 2020. CTA was obtained by her PCP without evidence of embolism but showed b/l diffuse pulmonary infiltrates. COVID 19 was negative. 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 Per the patient, she had an unspecified autoimmune disease and was referred to Dr. Trudie Reed, rheumatology, with negative work up.   She underwent bronchoscopy on 07/12/2019 with findings of lymphocytosis on BAL and granulomas. Her case was discussed at multidisciplinary conference on 08/02/2019 with diagnosis of hypersensitivity pneumonitis d/t down exposure and started on prednisone 40 mg daily with slow taper to be off by early Jan 2021. She contracted COVID 09/07/2019, with worsening symptoms, and received monoclonal antibody therapy. She was restarted on prednisone post COVID and tried to taper off; however, she was noted to have persistent symptoms with evolving ILD on CT chest. Prednisone was increased back to 40 mg daily with biopsy on 12/12/2019.   Completed pulmonary rehab end of 2021 and eventually tapered off steroids in 2021.   Has followed with Dr. Maceo Pro at [facility], who agrees with diagnosis of chronic HP complicated by post COVID ILD and  has been enrolled in long-term Covid clinic at [facility] and underwent a cadiac MRI and xenon gas lung scan for research purposes.   Presented in March 2022 with worsening dyspnea, chest tightness. Trialed prednisone 40 mg daily for 1 month to evaluated for any improvement. No change in 6 minute walk, CT, and PFTs. Overall, felt as though prednisone was not helping and tapered off.   TEST/EVENTS:  Bronchoscopy 07/12/2019 WBC 510, lymphocytes 38%, eos 12%, Microbiology-negative Cytology -no malignancy Transbronchial biopsy-nonnecrotizing epithelioid granulomas SARS-CoV-2, viral PCR-negative   Surgical lung biopsy 12/12/2019- features suggestive of chronic hypersensitivity pneumonitis with heterogeneous fibrosis and superimposed subacute lung injury.  01/17/2021 PFTs: FVC 2.38 (61), FEV1 2.12 (68), ratio 89, TLC 72%, DLCOunc 63% 07/05/2021 HRCT chest: redemonstrated moderate IPF, in apical to basal gradient pattern, with irregular peripheral interstitial opacity, septal thickening, extensive ground-glass, and areas of subpleural bronchiolectasis at lung bases. Fibrotic findings are appreciably worsened when compared to previous exam, with increase in ground glass. S/p right lung wedge resection.  10/17/2021 CTA chest: No PE, pulmonary fibrosis with groundglass opacity 03/10/2022 eos 440  04/03/2022 CXR: Patchy bibasilar airspace disease superimposed upon chronic scarring and fibrosis. 04/03/2022 FeNO 35 ppb   02/11/2022: Virtual visit with Dr. Vaughan Browner.  Started on [place] in January 2023.  She was treated with Levaquin course for infiltrates on CXR in February 2023.  CTA was obtained for persistent DOE and was negative for PE.  Increased Imuran to 75 mg in April 2023 but she developed diffuse body aches and was dropped back down to 50 mg/day.  Has since followed up with Dr. Maceo Pro at [facility] for reassessment to  order repeat autoimmune profile showing elevated ANA and aldolase.  She was seen by Dr. Amil Amen  rheumatology who noted no evidence of autoimmune disease.  04/03/2022: OV with Satoru Milich NP for acute visit. Since I saw her last, she was seen by Dr. Vaughan Browner on 6/20 and by Dr. Maceo Pro at [facility] on 7/17 who repeated her pulmonary function testing, which was declined when compared to previous. Imuran was slowly increased to max dose of 150 mg, which she started yesterday. They will likely initiate antifibrotic therapy if she is able to tolerate full dose of Imuran. She was also referred to pulmonary rehab. Today, she reports that over the past week, she has developed worsening of her cough. It has caused significant chest discomfort and is paroxysmal. She has shortness of breath with coughing spells. She has been using her albuterol inhaler, which does provide some relief, and occasionally uses the hycodan cough syrup if cough is severe enough. She also has had some generalized malaise and myalgias. She recently has had two UTIs, which she sees urology for, and had experience the malaise and myalgias with these as well. She's worried she may have another UTI so urology sent urine sample for culture today. She denies any fevers, chills, nausea, vomiting, anorexia, weight loss, night sweats, hemoptysis, leg swelling.  She has had a recent persistent cough that seems to be brought on by URI.  She was COVID-negative today.  No previous formal diagnosis of asthma.  However, she does have mid flow reversibility on past pulmonary function testing and has very good response to albuterol.  She is also recently had CBC that showed elevated eosinophils.  She has some evidence of bronchospasm on exam today.  Concerned that she might have a reactive airway/asthmatic component to her cough and symptoms. FeNO checked which was elevated.  We we will treat her with Depo 80 mg injection x1 and prednisone taper.  Restart ICS/LABA therapy -Rx for Symbicort placed.  Continue as needed albuterol.  Cough control measures advised.  CXR to rule out  superimposed infection - new patchy airspace disease upon chronic changes. Treated with augmentin 7 day course for possible pna.   04/22/2022: OV with Halona Amstutz NP for follow up after being treated for pneumonia and bronchitis with augmentin course and steroids. She had some confusion surrounding her abx and was only taking one pill a day. She initially wasn't feeling better so she rechecked her bottle and realized she was supposed to be taking it twice a day. She called the office on 8/18 to notify of this and we extended her by an additional 5 days. She reports that after this, which she completed on Friday, she started to notice a big difference and is finally starting to feel better. She feels like she has less congestion and fatigue is improving. She still has a dry cough, which is slightly improved. She denies any fevers, chills, anorexia, hemoptysis, orthopnea. She started using the symbicort and does feel like she notices a difference in her breathing with it. She continues on Imuran 150 mg daily. Good benefit from Symbicort - continue bid.   05/28/2022: Today - acute Patient presents today for acute visit. She was feeling slightly better after the augmentin course but never got back to her baseline; then last week, she started feeling worse. She reports she has been coughing more than usual. She notices that exertion and talking cause episodes of coughing. It is dry and paroxysmal at times. She's also been more short of breath  and feels like she's wheezing. She has some chest discomfort with coughing. She is feeling very run down/fatigued from all of the coughing. She denies any fevers, chills, leg swelling, orthopnea, hemoptysis, calf pain, recent sick exposures. She does feel like the Symbicort helps and opens up her airways. She is a little worried it might be causing her skin to break out on her face. She also uses her albuterol a few times a day; struggles with the inhaler during coughing spells but  hasn't tried her nebulizer treatments. She sees Dr. Maceo Pro at [facility] on 10/30.    Allergies  Allergen Reactions   Bupropion Other (See Comments)    Body aches / headaches  Other reaction(s): Dizziness, Headache   Methimazole Other (See Comments)    Body goes into arthritic shock    Immunization History  Administered Date(s) Administered   Influenza Split 05/19/2012   Influenza Whole 06/19/2008   Influenza, Quadrivalent, Recombinant, Inj, Pf 05/15/2020   Influenza,inj,Quad PF,6+ Mos 04/30/2015, 04/12/2016, 06/23/2018, 05/25/2019, 08/28/2021   Influenza,inj,quad, With Preservative 05/15/2020   Influenza-Unspecified 04/27/2015, 04/30/2015, 04/12/2016, 04/25/2016, 06/23/2018, 05/25/2019, 05/15/2020, 08/30/2021   PFIZER Comirnaty(Gray Top)Covid-19 Tri-Sucrose Vaccine 01/26/2020, 03/08/2020   PFIZER(Purple Top)SARS-COV-2 Vaccination 01/26/2020, 03/08/2020, 05/15/2020   PNEUMOCOCCAL CONJUGATE-20 03/10/2022   Td 07/13/2009    Past Medical History:  Diagnosis Date   Anxiety    Arthritis    Depression    Diabetes mellitus without complication (HCC)    Dyspnea    Fibromyalgia    GERD (gastroesophageal reflux disease)    Headache    migraines   Hypothyroidism 1999   post PTU Rx for hyperthyroidism   IBS (irritable bowel syndrome)    Interstitial lung disease ([place])    Pneumonia 06/2019   Double PNA    Tobacco History: Social History   Tobacco Use  Smoking Status Never  Smokeless Tobacco Never   Counseling given: Not Answered   Outpatient Medications Prior to Visit  Medication Sig Dispense Refill   acetaminophen (TYLENOL) 650 MG CR tablet Take 650 mg by mouth every 8 (eight) hours as needed for pain.     azaTHIOprine (IMURAN) 50 MG tablet TAKE 1 AND 1/2 TABLETS DAILY BY MOUTH (Patient taking differently: Take 150 mg by mouth daily. 3 - 29m tablets daily) 135 tablet 1   budesonide-formoterol (SYMBICORT) 160-4.5 MCG/ACT inhaler Inhale 2 puffs into the lungs in the morning and  at bedtime. 1 each 12   DULoxetine (CYMBALTA) 60 MG capsule TAKE 1 CAPSULE BY MOUTH EVERY DAY (Patient taking differently: Take 60 mg by mouth daily.) 90 capsule 2   ferrous sulfate 324 MG TBEC Take 324 mg by mouth.     HYDROcodone bit-homatropine (HYCODAN) 5-1.5 MG/5ML syrup Take 5 mLs by mouth every 6 (six) hours as needed for cough. 240 mL 0   hydroxychloroquine (PLAQUENIL) 200 MG tablet Take 200 mg by mouth daily.     ipratropium-albuterol (DUONEB) 0.5-2.5 (3) MG/3ML SOLN TAKE 3 MLS BY NEBULIZATION EVERY 6 (SIX) HOURS AS NEEDED. 180 mL 1   levalbuterol (XOPENEX HFA) 45 MCG/ACT inhaler Inhale 2 puffs into the lungs every 4 (four) hours as needed for wheezing. 1 each 12   levothyroxine (SYNTHROID) 125 MCG tablet Take 125 mcg by mouth daily before breakfast.     metFORMIN (GLUCOPHAGE-XR) 500 MG 24 hr tablet Take 500 mg by mouth in the morning and at bedtime. 10018mtotal daily     pantoprazole (PROTONIX) 40 MG tablet TAKE 1 TABLET BY MOUTH EVERY DAY 90 tablet  1   pregabalin (LYRICA) 150 MG capsule Take 1 capsule (150 mg total) by mouth 2 (two) times daily. (Patient taking differently: Take 150 mg by mouth in the morning, at noon, and at bedtime.) 60 capsule 3   benzonatate (TESSALON) 100 MG capsule TAKE 1 CAPSULE BY MOUTH THREE TIMES A DAY AS NEEDED FOR COUGH 30 capsule 1   No facility-administered medications prior to visit.     Review of Systems:   Constitutional: No weight loss or gain, night sweats, fevers, chills. +fatigue, lassitude  HEENT: No headaches, difficulty swallowing, tooth/dental problems, or sore throat. No sneezing, itching, ear ache, nasal congestion, or post nasal drip CV:  No chest pain, orthopnea, PND, swelling in lower extremities, anasarca, palpitations, syncope Resp: +shortness of breath with exertion; persistent, dry cough; wheezing. No excess mucus or change in color of mucus. No hemoptysis. No chest wall deformity GI:  No heartburn, indigestion, abdominal pain,  nausea, vomiting, diarrhea, change in bowel habits, loss of appetite, bloody stools.  MSK:   No joint swelling.  No decreased range of motion.  No back pain. Neuro: No dizziness or lightheadedness.  No cog changes or balance issues Psych: No depression or anxiety. Mood stable.     Physical Exam:  BP 110/80   Pulse 95   Ht _0  (1.651 m)   Wt 186 lb 3.2 oz (84.5 kg)   SpO2 95% Comment: RA  BMI 30.99 kg/m   GEN: Pleasant, interactive, well-appearing; in no acute distress. HEENT:  Normocephalic and atraumatic. PERRLA. Sclera white. Nasal turbinates pink, moist and patent bilaterally. No rhinorrhea present. Oropharynx erythematous and moist, without exudate or edema. No lesions, ulcerations NECK:  Supple w/ fair ROM. No JVD present. Normal carotid impulses w/o bruits. Thyroid symmetrical with no goiter or nodules palpated. No lymphadenopathy.   CV: RRR, no m/r/g, no peripheral edema. Pulses intact, +2 bilaterally. No cyanosis, pallor or clubbing. PULMONARY:  Unlabored, regular breathing. Minimal end expiratory wheeze LLL otherwise clear bilaterally A&P w/o wheezes/rales/rhonchi. Bronchitic cough.. No accessory muscle use. No dullness to percussion. GI: BS present and normoactive. Soft, non-tender to palpation. No organomegaly or masses detected.  MSK: No erythema, warmth or tenderness. No deformities or joint swelling noted.  Neuro: A/Ox3. No focal deficits noted.   Psych: Normal affect and behavior. Judgement and thought content appropriate.     Lab Results:  CBC    Component Value Date/Time   WBC 5.9 02/12/2022 1115   RBC 4.47 02/12/2022 1115   HGB 13.6 02/12/2022 1115   HCT 41.0 02/12/2022 1115   PLT 274.0 02/12/2022 1115   MCV 91.7 02/12/2022 1115   MCH 28.6 12/13/2019 0252   MCHC 33.1 02/12/2022 1115   RDW 12.9 02/12/2022 1115   LYMPHSABS 1.0 02/12/2022 1115   MONOABS 0.7 02/12/2022 1115   EOSABS 0.3 02/12/2022 1115   BASOSABS 0.0 02/12/2022 1115    BMET     Component Value Date/Time   NA 138 04/22/2022 1155   K 3.7 04/22/2022 1155   CL 101 04/22/2022 1155   CO2 29 04/22/2022 1155   GLUCOSE 125 (H) 04/22/2022 1155   BUN 11 04/22/2022 1155   CREATININE 0.70 04/22/2022 1155   CREATININE 0.75 03/25/2019 1610   CALCIUM 9.3 04/22/2022 1155   GFRNONAA >60 12/13/2019 0252   GFRAA >60 12/13/2019 0252    BNP No results found for: "BNP"   Imaging:  DG Chest 2 View  Result Date: 05/28/2022 CLINICAL DATA:  Follow-up pneumonia. EXAM: CHEST - 2  VIEW COMPARISON:  Chest radiograph 04/03/2022; CT angio chest 10/17/2021 FINDINGS: The heart size and mediastinal contours are within normal limits. Stable lower lobe predominant fibrotic changes. No focal consolidation, pleural effusion or pneumothorax. Postoperative changes in the right upper lobe. IMPRESSION: 1. No acute cardiopulmonary abnormality. Specifically, no findings of pneumonia as queried. 2. Chronic fibrosis. Electronically Signed   By: Ileana Roup M.D.   On: 05/28/2022 09:23    methylPREDNISolone acetate (DEPO-MEDROL) injection 80 mg     Date Action Dose Route User   04/03/2022 1713 Given 80 mg Intramuscular (Right Upper Outer Quadrant) Valerie Salts, CMA          Latest Ref Rng & Units 01/17/2021   11:02 AM 11/16/2020    3:05 PM 04/02/2020    3:35 PM  PFT Results  FVC-Pre L 2.31  2.15  2.78   FVC-Predicted Pre % 59  55  71   FVC-Post L 2.38  2.14    FVC-Predicted Post % 61  55    Pre FEV1/FVC % % 89  90  88   Post FEV1/FCV % % 89  91    FEV1-Pre L 2.06  1.95  2.45   FEV1-Predicted Pre % 66  62  78   FEV1-Post L 2.12  1.95    DLCO uncorrected ml/min/mmHg 14.60  16.02  17.38   DLCO UNC% % 63  69  75   DLCO corrected ml/min/mmHg 14.60  16.02  17.38   DLCO COR %Predicted % 63  69  75   DLVA Predicted % 103  112  105   TLC L 3.86  2.24    TLC % Predicted % 72  41    RV % Predicted % 80  -1      Lab Results  Component Value Date   NITRICOXIDE 10 04/22/2022         Assessment & Plan:   Eosinophilic asthma Constellation of symptoms and findings of elevated eosinophils (440) with previous elevated exhaled nitric oxide (35 ppb), consistent with eosinophilic asthma. She has had better response recently to steroids and ICS/LABA inhaler than she has in the past. Discussed initiating biologic therapy for further management as she is still poorly controlled despite high dose ICS/LABA with Dr. Vaughan Browner. Shared decision with patient and her husband. We will start her on biologic therapy with Dupixent. In the interim, start low dose daily prednisone. Continue high dose ICS/LABA therapy.   Patient Instructions  Continue albuterol inhaler 2 puffs or duoneb 3 mL neb every 6 hours as needed for shortness of breath or wheezing. Notify if symptoms persist despite rescue inhaler/neb use. Try to use your neb instead of your inhaler if you are having a coughing spell.  Continue Imuran 150 mg daily  Continue protonix 40 mg daily  Continue hycodan cough syrup 5 mL every 6 hours as needed for cough Continue Symbicort 2 puffs Twice daily. Brush tongue and rinse mouth afterwards Continue tessalon perles 1 capsule Three times a day for cough until cough improves Continue chlorpheniramine tab 4 mg over the counter At bedtime for cough  Start prednisone 10 mg daily for the next two weeks then try to decrease to 5 mg daily. If you notice that your breathing or cough is worse after the decreased dose, you can go back to 10 mg. Take in AM with food  Start Dupixent injections every 2 weeks - pharmacy team will contact you regarding next steps   Suck on sugar free hard  candies or non-menthol cough drops throughout the day. Frequent sips of water.    Follow up in two weeks with Dr. Vaughan Browner or Alanson Aly. If symptoms do not improve or worsen, please contact office for sooner follow up or seek emergency care.     CAP (community acquired pneumonia) Superimposed b/l patchy  infiltrates in lower lobes 8/10; treated with augmentin course. CXR shows resolution today with stable fibrotic changes.   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  She is followed by Dr. Maceo Pro at [facility]. Currently on max dose of Imuran aT 150 mg daily.  She is tolerating this well so far.  They are going to consider adding on antifibrotics down the line.  Follow-up with Dr. Josph Macho as scheduled.    I spent 42 of dedicated to the care of this patient on the date of this encounter to include pre-visit review of records, face-to-face time with the patient discussing conditions above, post visit ordering of testing, clinical documentation with the electronic health record, making appropriate referrals as documented, and communicating necessary findings to members of the patients care team.  Clayton Bibles, NP 05/28/2022  Pt aware and understands NP's role.

## 2022-05-28 NOTE — Patient Instructions (Addendum)
Continue albuterol inhaler 2 puffs or duoneb 3 mL neb every 6 hours as needed for shortness of breath or wheezing. Notify if symptoms persist despite rescue inhaler/neb use. Try to use your neb instead of your inhaler if you are having a coughing spell.  Continue Imuran 150 mg daily  Continue protonix 40 mg daily  Continue hycodan cough syrup 5 mL every 6 hours as needed for cough Continue Symbicort 2 puffs Twice daily. Brush tongue and rinse mouth afterwards Continue tessalon perles 1 capsule Three times a day for cough until cough improves Continue chlorpheniramine tab 4 mg over the counter At bedtime for cough  Start prednisone 10 mg daily for the next two weeks then try to decrease to 5 mg daily. If you notice that your breathing or cough is worse after the decreased dose, you can go back to 10 mg. Take in AM with food  Start Dupixent injections every 2 weeks - pharmacy team will contact you regarding next steps   Suck on sugar free hard candies or non-menthol cough drops throughout the day. Frequent sips of water.    Follow up in two weeks with Dr. Vaughan Browner or Alanson Aly. If symptoms do not improve or worsen, please contact office for sooner follow up or seek emergency care.

## 2022-05-29 ENCOUNTER — Encounter: Payer: Self-pay | Admitting: Nurse Practitioner

## 2022-06-13 ENCOUNTER — Other Ambulatory Visit (HOSPITAL_BASED_OUTPATIENT_CLINIC_OR_DEPARTMENT_OTHER): Payer: Self-pay | Admitting: Critical Care Medicine

## 2022-06-13 ENCOUNTER — Telehealth: Payer: Self-pay | Admitting: Pulmonary Disease

## 2022-06-13 DIAGNOSIS — J849 Interstitial pulmonary disease, unspecified: Secondary | ICD-10-CM

## 2022-06-13 NOTE — Telephone Encounter (Signed)
Patient  is requesting Hycodan refill to be sent to [facility].  Last refill 05/09/22 235ml   Message routed to Dr. Vaughan Browner to advise

## 2022-06-16 MED ORDER — HYDROCODONE BIT-HOMATROP MBR 5-1.5 MG/5ML PO SOLN: 5.0000 mL | Freq: Four times a day (QID) | ORAL | 0 refills | Status: DC | PRN

## 2022-06-16 NOTE — Telephone Encounter (Signed)
Called and spoke to patient and gave her the update that cough medication has been refilled. Nothing further needed

## 2022-06-16 NOTE — Telephone Encounter (Signed)
Hycodan has been filled

## 2022-06-18 ENCOUNTER — Telehealth: Payer: Self-pay | Admitting: Pulmonary Disease

## 2022-06-18 MED ORDER — AZATHIOPRINE 50 MG PO TABS: 150.0000 mg | ORAL_TABLET | Freq: Every day | ORAL | 3 refills | Status: DC

## 2022-06-18 NOTE — Telephone Encounter (Signed)
Pt called the office in regards to her Imuran Rx. Stated that the current Rx that the pharmacy has is for 75mg  but she is currently on 150mg . Pt keeps running out of the medication due to the current Rx. Stated to pt that I would send a new Rx for the 150mg  and she verbalized understanding. Rx sent to pharmacy for pt. Nothing further needed.

## 2022-06-19 ENCOUNTER — Ambulatory Visit (HOSPITAL_BASED_OUTPATIENT_CLINIC_OR_DEPARTMENT_OTHER)
Admission: RE | Admit: 2022-06-19 | Discharge: 2022-06-19 | Disposition: A | Discharge status: Home / Self Care | Payer: 59 | Source: Ambulatory Visit | Attending: Critical Care Medicine | Admitting: Critical Care Medicine

## 2022-06-19 DIAGNOSIS — J849 Interstitial pulmonary disease, unspecified: Secondary | ICD-10-CM | POA: Insufficient documentation

## 2022-06-20 ENCOUNTER — Other Ambulatory Visit: Payer: Self-pay | Admitting: Pulmonary Disease

## 2022-06-24 MED ORDER — IPRATROPIUM-ALBUTEROL 0.5-2.5 (3) MG/3ML IN SOLN: 3.0000 mL | Freq: Four times a day (QID) | RESPIRATORY_TRACT | 5 refills | Status: AC | PRN

## 2022-06-26 ENCOUNTER — Other Ambulatory Visit: Payer: Self-pay | Admitting: Nurse Practitioner

## 2022-06-26 DIAGNOSIS — J8283 Eosinophilic asthma: Secondary | ICD-10-CM

## 2022-06-27 ENCOUNTER — Telehealth (HOSPITAL_COMMUNITY): Payer: Self-pay

## 2022-06-27 NOTE — Telephone Encounter (Signed)
Pt called and stated she is interested in PR, explained scheduling process and went , patient verbalized understanding. Will pass pt ppw to RN for review.

## 2022-07-01 ENCOUNTER — Telehealth (HOSPITAL_COMMUNITY): Payer: Self-pay

## 2022-07-01 NOTE — Telephone Encounter (Signed)
General 06/30/2022  3:29 PM [facility] -  Note: Pt is not interested in the pulmonary rehab program, she stated she was able to get in somewhere else sooner. Closed referral.

## 2022-07-15 ENCOUNTER — Encounter: Payer: Self-pay | Admitting: Pulmonary Disease

## 2022-07-15 DIAGNOSIS — J849 Interstitial pulmonary disease, unspecified: Secondary | ICD-10-CM

## 2022-07-16 MED ORDER — HYDROCODONE BIT-HOMATROP MBR 5-1.5 MG/5ML PO SOLN: 5.0000 mL | Freq: Four times a day (QID) | ORAL | 0 refills | Status: DC | PRN

## 2022-08-12 ENCOUNTER — Encounter: Payer: Self-pay | Admitting: Pulmonary Disease

## 2022-08-12 DIAGNOSIS — Z5181 Encounter for therapeutic drug level monitoring: Secondary | ICD-10-CM

## 2022-08-13 NOTE — Telephone Encounter (Signed)
Yes, needs CMET rechecked. She can either come in for labs here and I can order her TSH (CMET will include potassium) or she can go to Dr. Acie Fredrickson office and ask if they can draw her CMET. Whichever is more convenient for her. Thanks!

## 2022-08-13 NOTE — Telephone Encounter (Signed)
Mychart message sent by pt:  [name]  [facility] (supporting Praveen Mannam, MD)20 hours ago (1:42 PM)    Dr Mannam/ Dr Allison Quarry- I believe I'm overdue to have my liver enzymes checked. I've also began, as of last Wednesday 12/13, OFEV. 100mg . I was approved w/ $0 copay.    Please confirm I can come get these drawn Thursday.  *Also- Dr Wednesday my PCP wanted me to get my TSH & Potassium rechecked (I've attached a screenshot from the [facility]-app confirming this request)   Can I get ALL drawn at your lab? Instead of going to 2 different? Trying to avoid exposure + exhaustion. Thank you!!    [name]   Attachments  [phone]   Katie, please advise.

## 2022-08-14 ENCOUNTER — Other Ambulatory Visit: Payer: Self-pay | Admitting: Nurse Practitioner

## 2022-08-14 ENCOUNTER — Telehealth: Payer: Self-pay | Admitting: Nurse Practitioner

## 2022-08-14 ENCOUNTER — Other Ambulatory Visit (INDEPENDENT_AMBULATORY_CARE_PROVIDER_SITE_OTHER): Payer: 59

## 2022-08-14 DIAGNOSIS — Z5181 Encounter for therapeutic drug level monitoring: Secondary | ICD-10-CM | POA: Diagnosis not present

## 2022-08-14 NOTE — Telephone Encounter (Signed)
PT called and said Lab was to have two tests done (CMEC test was to be ordered at lab today according to K. Cobb) but only was in on lab order # [id number] . Unless I have missed seeing it. Please advise. TY

## 2022-08-14 NOTE — Telephone Encounter (Signed)
Called and spoke to patient and she states that she is in the process of parking and about to walk in the clinic. But I did confirm with katie the the liver function test included with the CMET blood panel. So no worries. Nothing further needed

## 2022-08-15 LAB — COMPREHENSIVE METABOLIC PANEL
ALT: 9 U/L (ref 0–35)
AST: 16 U/L (ref 0–37)
Albumin: 4.4 g/dL (ref 3.5–5.2)
Alkaline Phosphatase: 70 U/L (ref 39–117)
BUN: 11 mg/dL (ref 6–23)
CO2: 30 mEq/L (ref 19–32)
Calcium: 9.1 mg/dL (ref 8.4–10.5)
Chloride: 101 mEq/L (ref 96–112)
Creatinine, Ser: 0.69 mg/dL (ref 0.40–1.20)
GFR: 103.47 mL/min (ref >60.00)
Glucose, Bld: 99 mg/dL (ref 70–99)
Potassium: 3.8 mEq/L (ref 3.5–5.1)
Sodium: 139 mEq/L (ref 135–145)
Total Bilirubin: 0.4 mg/dL (ref 0.2–1.2)
Total Protein: 6.8 g/dL (ref 6.0–8.3)

## 2022-08-15 LAB — TSH: TSH: 2.63 u[IU]/mL (ref 0.35–5.50)

## 2022-08-21 NOTE — Progress Notes (Signed)
Labs nl. Thanks

## 2022-09-01 DIAGNOSIS — J849 Interstitial pulmonary disease, unspecified: Secondary | ICD-10-CM

## 2022-09-01 DIAGNOSIS — R059 Cough, unspecified: Secondary | ICD-10-CM

## 2022-09-01 MED ORDER — HYDROCODONE BIT-HOMATROP MBR 5-1.5 MG/5ML PO SOLN: 5.0000 mL | Freq: Four times a day (QID) | ORAL | 0 refills | Status: DC | PRN

## 2022-09-01 NOTE — Telephone Encounter (Signed)
Mychart message sent by pt: [name]  [facility] (supporting Katherine Cobb V, NP)5 minutes ago (1:50 PM)    I am down to just 1 final dose of the Hydrocodone cough syrup.    [facility] [address]/ inside the [employer] [phone] to fill. [facility].    Thank you  [name]     Dr. Vaughan Browner, please advise.

## 2022-09-02 ENCOUNTER — Other Ambulatory Visit: Payer: Self-pay | Admitting: Nurse Practitioner

## 2022-09-02 DIAGNOSIS — J4541 Moderate persistent asthma with (acute) exacerbation: Secondary | ICD-10-CM

## 2022-09-29 ENCOUNTER — Other Ambulatory Visit: Payer: Self-pay | Admitting: Pulmonary Disease

## 2022-09-29 DIAGNOSIS — J4541 Moderate persistent asthma with (acute) exacerbation: Secondary | ICD-10-CM

## 2022-10-14 ENCOUNTER — Telehealth (HOSPITAL_COMMUNITY): Payer: Self-pay

## 2022-10-14 NOTE — Telephone Encounter (Signed)
Attempted to contact patient to schedule OP Modified Barium Swallow - left voicemail.

## 2022-10-21 ENCOUNTER — Other Ambulatory Visit (HOSPITAL_COMMUNITY): Payer: Self-pay

## 2022-10-21 DIAGNOSIS — R1310 Dysphagia, unspecified: Secondary | ICD-10-CM

## 2022-11-03 ENCOUNTER — Ambulatory Visit (HOSPITAL_COMMUNITY): Admission: RE | Admit: 2022-11-03 | Payer: 59 | Source: Ambulatory Visit

## 2022-11-03 ENCOUNTER — Other Ambulatory Visit: Payer: Self-pay | Admitting: Pulmonary Disease

## 2022-11-03 ENCOUNTER — Ambulatory Visit (HOSPITAL_COMMUNITY): Payer: 59

## 2022-11-11 ENCOUNTER — Encounter: Payer: Self-pay | Admitting: Pulmonary Disease

## 2022-11-11 MED ORDER — AEROCHAMBER MV MISC: 0 refills | Status: AC

## 2022-11-12 ENCOUNTER — Ambulatory Visit (HOSPITAL_COMMUNITY)
Admission: RE | Admit: 2022-11-12 | Discharge: 2022-11-12 | Disposition: A | Discharge status: Home / Self Care | Payer: 59 | Source: Ambulatory Visit | Attending: Internal Medicine | Admitting: Internal Medicine

## 2022-11-12 ENCOUNTER — Ambulatory Visit (HOSPITAL_COMMUNITY)
Admission: RE | Admit: 2022-11-12 | Discharge: 2022-11-12 | Disposition: A | Discharge status: Home / Self Care | Payer: 59 | Source: Ambulatory Visit | Attending: Gastroenterology | Admitting: Gastroenterology

## 2022-11-12 DIAGNOSIS — R1310 Dysphagia, unspecified: Secondary | ICD-10-CM

## 2022-11-12 NOTE — Progress Notes (Signed)
Modified Barium Swallow Study  Patient Details  Name: [name] MRN: [id number] Date of Birth: Jan 21, 1976  Today's Date: 11/12/2022  Modified Barium Swallow completed.  Full report located under Chart Review in the Imaging Section.  History of Present Illness 47 year old female, arrives for OP MBS, referred by GI due to recurrent globus.  never smoker followed for ILD with hypersensitivity pneumonitis, post COVID ILD with long-haul syndrome, and reactive airway disease. She is a patient of Dr. Vaughan Browner.  Past medical history significant for allergic rhinitis, hypothyroidism, PCO, depression with anxiety, fibromyalgia, IBS, GER.   Clinical Impression Pt exhibits slight delay in swallow initiation likely secondary to labored breathing-swallowing pattern or even decreased pharyngeal sensation in setting of use of supplemental O2 and inhaled steriod use. Pt typically does not swallow liquids until they have briefly pooled in the pharyngeal sulci. No penetration, aspiration or residue occurs. Esophageal sweep with barium tablet appears WNL. No physiologic cause for globus apparent. No SLP f/u needed unless endoscopic finding of laryngeal edema warrants SLP treatment for laryngeal hygiene and cough suppression therapy. Factors that may increase risk of adverse event in presence of aspiration Phineas Douglas & Padilla 2021):    Swallow Evaluation Recommendations Liquid Administration via: Cup;Straw Medication Administration: Whole meds with liquid Supervision: Patient able to self-feed Recommended consults: Consider ENT consultation      Lynann Beaver 11/12/2022,2:09 PM

## 2022-12-08 ENCOUNTER — Encounter: Payer: Self-pay | Admitting: *Deleted

## 2022-12-27 ENCOUNTER — Other Ambulatory Visit: Payer: Self-pay | Admitting: Pulmonary Disease

## 2023-01-22 ENCOUNTER — Encounter: Payer: Self-pay | Admitting: Pulmonary Disease

## 2023-01-26 NOTE — Telephone Encounter (Signed)
Scheduled pt with first available acute visit on 01/30/23 with [name]. Pt will let us know if appointment will work for her or not. Nothing further needed at this time.

## 2023-01-30 ENCOUNTER — Ambulatory Visit (INDEPENDENT_AMBULATORY_CARE_PROVIDER_SITE_OTHER): Payer: 59

## 2023-01-30 ENCOUNTER — Encounter: Payer: Self-pay | Admitting: Pulmonary Disease

## 2023-01-30 ENCOUNTER — Ambulatory Visit (INDEPENDENT_AMBULATORY_CARE_PROVIDER_SITE_OTHER): Payer: 59 | Admitting: Pulmonary Disease

## 2023-01-30 VITALS — BP 126/84 | HR 100 | Temp 98.1°F | Ht 66.0 in | Wt 179.8 lb

## 2023-01-30 DIAGNOSIS — R059 Cough, unspecified: Secondary | ICD-10-CM

## 2023-01-30 DIAGNOSIS — J849 Interstitial pulmonary disease, unspecified: Secondary | ICD-10-CM

## 2023-01-30 MED ORDER — AZITHROMYCIN 250 MG PO TABS: ORAL_TABLET | ORAL | 0 refills | Status: AC

## 2023-01-30 MED ORDER — PREDNISONE 20 MG PO TABS: ORAL_TABLET | ORAL | 0 refills | Status: AC

## 2023-01-30 MED ORDER — HYDROCODONE BIT-HOMATROP MBR 5-1.5 MG/5ML PO SOLN
5.0000 mL | Freq: Four times a day (QID) | ORAL | 0 refills | Status: DC | PRN
Start: 2023-01-30 — End: 2023-07-17

## 2023-01-30 NOTE — Patient Instructions (Signed)
Will prescribe a Z-Pak and prednisone 40 mg a day for 5 days Will get a chest x-ray today I will send in a refill for Hycodan cough syrup Start using the Wixela inhaler twice a day for at least the next couple of weeks until your cough is improved Follow-up in clinic in 4 to 6 months

## 2023-01-30 NOTE — Progress Notes (Signed)
[name]    [id number]    25-Jan-1976  Primary Care Physician:Skakle, Eliberto Ivory, DO  Referring Physician: Charlane Ferretti, DO [address] 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  with history of fibromyalgia, depression, anxiety Referred for evaluation of abnormal CT scan showing alternate pattern, NSIP, ILD and August 2020  She underwent bronchoscopy on 07/12/2019 with findings of lymphocytosis on BAL and granulomas. 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 Marland Kitchen She was restarted on prednisone post COVID due to worsening dyspnea.  Given persistent symptoms she had surgical lung biopsy done on 12/12/19 with discussion at multidisciplinary conference showing features suggestive of chronic hypersensitivity pneumonitis with heterogeneous fibrosis and superimposed subacute lung injury. She was eventually tapered off steroids in 2021  In the interim she has followed up with Dr. 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    Revaluated in 2022 for worsening interstitial changes with fibrosis and groundglass opacities.  She did not respond to a trial of steroids.  Given worsening CT findings after discussion with her started CellCept however within a few weeks she had a flareup of her rosacea and CellCept stopped.    Started on azathioprine in January 2023.  Labs around that time showed elevated ANA, aldolase.  Evaluated by Dr. Dierdre Forth, rheumatology and may have 2023 and  felt to have low suspicion for systemic autoimmune disease.  Pets: Has dogs.  No birds, farm animals Occupation: Works as a [employer] account planner.  Currently working from home Exposures: No mold, hot tub, Jacuzzi.  She has a down comforter for many years and got a down pillow in 2019. She got rid of the down after bronchoscopy in late 2020 Smoking history: No significant smoking history Travel history: Previously lived in [place], [place].  Has been living in [place] for the past 18 years.  No significant recent travel Relevant family history: Grandfather died of lung cancer.  No other significant family history of lung disease.  Interim history:  Here is an acute visit for increasing cough for the past 2 weeks.  This is nonproductive in nature.  Denies any dyspnea, fevers, chills  She continues follow-up with Dr. Josph Macho at [facility] and is currently on azathioprine 150 mg a day and Ofev 100 mg twice daily.  Also is on transplant evaluation at [facility].  Recently finished pulmonary rehab at [facility]  Outpatient Encounter Medications as of 01/30/2023  Medication Sig   acetaminophen (TYLENOL) 650 MG CR tablet Take 650 mg by mouth every 8 (eight) hours as needed for pain.   azaTHIOprine (IMURAN) 50 MG tablet TAKE 1 AND 1/2 TABLETS BY MOUTH DAILY   DULoxetine (CYMBALTA) 60 MG capsule TAKE 1 CAPSULE BY MOUTH EVERY DAY (Patient taking differently: Take 60 mg by mouth daily.)   ferrous sulfate 324 MG TBEC Take 324 mg by mouth.   hydroxychloroquine (PLAQUENIL) 200 MG tablet Take 200 mg by mouth daily.   ipratropium-albuterol (DUONEB) 0.5-2.5 (3) MG/3ML SOLN Take 3 mLs by nebulization every  6 (six) hours as needed.   levalbuterol (XOPENEX HFA) 45 MCG/ACT inhaler Inhale 2 puffs into the lungs every 4 (four) hours as needed for wheezing.   levothyroxine (SYNTHROID) 125 MCG tablet Take 125 mcg by mouth daily before breakfast.   metFORMIN (GLUCOPHAGE-XR) 500 MG 24 hr tablet Take 500 mg by mouth in the  morning and at bedtime. 1000mg  total daily   pantoprazole (PROTONIX) 40 MG tablet TAKE 1 TABLET BY MOUTH EVERY DAY   pregabalin (LYRICA) 150 MG capsule Take 1 capsule (150 mg total) by mouth 2 (two) times daily. (Patient taking differently: Take 150 mg by mouth in the morning, at noon, and at bedtime.)   Spacer/Aero-Holding Chambers (AEROCHAMBER MV) inhaler Use as instructed   HYDROcodone bit-homatropine (HYCODAN) 5-1.5 MG/5ML syrup Take 5 mLs by mouth every 6 (six) hours as needed for cough. (Patient not taking: Reported on 01/30/2023)   [DISCONTINUED] benzonatate (TESSALON) 100 MG capsule TAKE 1 CAPSULE BY MOUTH THREE TIMES A DAY AS NEEDED FOR COUGH   [DISCONTINUED] predniSONE (DELTASONE) 10 MG tablet TAKE 1 TABLET (10 MG TOTAL) BY MOUTH DAILY WITH BREAKFAST.   [DISCONTINUED] WIXELA INHUB 250-50 MCG/ACT AEPB INHALE 1 PUFF INTO THE LUNGS IN THE MORNING AND AT BEDTIME.   No facility-administered encounter medications on file as of 01/30/2023.   Physical Exam: Blood pressure 126/84, pulse 100, temperature 98.1 F (36.7 C), temperature source Oral, height 5\' 6"  (1.676 m), weight 179 lb 12.8 oz (81.6 kg), SpO2 97 %. Gen:      No acute distress HEENT:  EOMI, sclera anicteric Neck:     No masses; no thyromegaly Lungs:    Clear to auscultation bilaterally; normal respiratory effort CV:         Regular rate and rhythm; no murmurs Abd:      + bowel sounds; soft, non-tender; no palpable masses, no distension Ext:    No edema; adequate peripheral perfusion Skin:      Warm and dry; no rash Neuro: alert and oriented x 3 Psych: normal mood and affect   Data Reviewed: Imaging: CTA 06/15/2019-no pulmonary embolism, mild diffuse interstitial prominence with bilateral groundglass and nodular airspace opacities.    High-res CT 07/11/2019-Bilateral fine nodularity with groundglass with no significant air trapping.  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  High-res CT 11/02/2019-subpleural reticulation with groundglass,  traction bronchiectasis no basal gradient.  Indeterminate for UIP.  High-res CT 05/09/2020-stable interstitial lung disease  High-res CT 11/02/2020-minimal progression of fibrotic lung disease with groundglass opacities  High-res CT 01/08/2021-stable ILD compared to March 2022  High-res CT 07/04/2021-significantly increased groundglass opacities with progression of fibrotic changes.    CT high-resolution 06/19/2022-stable pattern of groundglass opacities, fibrotic changes in alternate diagnosis pattern.  Favored to be fibrotic phase of NSIP. I have reviewed the images personally.  PFTs: [facility] 11/04/2019 FVC 2.53 [68%], FEV1 2.26 [75%], F/F 89, SVC 1.75 [50%], DLCO 14.61 [65%] Moderate restriction, diffusion defect.  11/16/2020 FVC 2.14 [5%), FEV1 1.95 (62%], F/F 91, TLC 2.24 (41%), DLCO 16.02 (69%] Severe restriction, mild diffusion defect.  01/17/2021 FVC 2.38 (61%), FEV1 2.12 (68%), F/F 89, TLC 3.86 [72%), DLCO 14.60 (63%) Severe restriction, mild diffusion defect  6-minute walk 12/27/2020- 363 m  Labs: ANA 03/25/2019-negative,  Rheumatoid factor 03/25/2019- < 14 Repeat CTD serologies 07/18/2019-negative, hypersensitivity panel-negative  CBC 06/15/2019-WBC 5.87, eos 6.4%, absolute eosinophil count 376 Metabolic panel 06/15/2019-within normal limits D-dimer 06/30/2019-0.41 BNP 06/30/2019-10.   Bronchoscopy 07/12/2019 WBC 510, lymphocytes 38%, eos 12%, Microbiology-negative Cytology -no malignancy Transbronchial biopsy-nonnecrotizing  epithelioid granulomas SARS-CoV-2, viral PCR-negative  Surgical lung biopsy 12/12/2019- features suggestive of chronic hypersensitivity pneumonitis with heterogeneous fibrosis and superimposed subacute lung injury.  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  Surgical lung biopsy discussed at our conference and [facility] agree with diagnosis of chronic hypersensitivity pneumonitis, post Covid ILD  Due to worsening interstitial changes she is now on Imuran, Ofev and is undergoing transplant evaluation Has not tolerated CellCept in the past due to rosacea  Plan/Recommendations: - Continue azathioprine, CellCept - Z-Pak, prednisone, Hycodan - Chest x-ray - Start Wixela inhaler  Chilton Greathouse MD [facility] 01/30/2023, 1:31 PM  CC: Charlane Ferretti, DO

## 2023-02-02 ENCOUNTER — Encounter: Payer: Self-pay | Admitting: Pulmonary Disease

## 2023-02-02 NOTE — Telephone Encounter (Signed)
Sorry for the delay as the radiology has been delayed by at least a week with their reads of x rays.  On my review of the x ray there does appear to be increased infiltrates that my represent an infection. Continue with the antibiotics and prednisone and order a follow up chest x ray in 4 weeks.

## 2023-02-02 NOTE — Telephone Encounter (Signed)
Patient requested CXR results 01/30/23.  Message routed to [name] to advise

## 2023-02-06 ENCOUNTER — Encounter: Payer: Self-pay | Admitting: Pulmonary Disease

## 2023-02-11 NOTE — Telephone Encounter (Signed)
Received the following message from patient:   "Per Dr Shirlee Carter message this morning, after final radiology read of my X-ray- I want to report my cough is still prevalent & persists.    Not sure what to do from here, as I've had to utilize the cough syrup frequently over the past week & half to stabilize the best I can.    From what I was told initially (& from Dr Shirlee Carter read of the X-ray) the viral infection was there.  But the radiology reads Carter of tissue/ bone etc and is quite odd. Never have had an X-ray verbiage quite like this one.    Leads me to believe it's just a viral infection- seems to be worsening over time. Anytime I try to do any bit of anything I cough. Uncontrollably at times.    Any suggestions or help remotely as I'm headed to [facility] tomorrow afternoon. Luckily we're driving- not flying.    Thank you [name]"  [name], can you please advise? Thanks!

## 2023-02-11 NOTE — Telephone Encounter (Signed)
I am okay with sending another Z-Pak Can use Delsym and Tessalon but I am not sure if they worked in the past Please find out which pharmacy to send this to as she is probably in [place] at present.

## 2023-02-12 MED ORDER — AZITHROMYCIN 250 MG PO TABS: ORAL_TABLET | ORAL | 0 refills | Status: AC

## 2023-03-11 ENCOUNTER — Other Ambulatory Visit: Payer: Self-pay | Admitting: Pulmonary Disease

## 2023-04-06 DIAGNOSIS — N898 Other specified noninflammatory disorders of vagina: Secondary | ICD-10-CM | POA: Insufficient documentation

## 2023-04-14 DIAGNOSIS — N764 Abscess of vulva: Secondary | ICD-10-CM | POA: Insufficient documentation

## 2023-05-29 ENCOUNTER — Other Ambulatory Visit: Payer: Self-pay | Admitting: Pulmonary Disease

## 2023-05-29 DIAGNOSIS — J849 Interstitial pulmonary disease, unspecified: Secondary | ICD-10-CM

## 2023-06-03 ENCOUNTER — Encounter: Payer: Self-pay | Admitting: Pulmonary Disease

## 2023-06-05 NOTE — Telephone Encounter (Signed)
F/u   Patient checking on the status of messages 06/03/23.

## 2023-06-09 MED ORDER — HYDROCODONE BIT-HOMATROP MBR 5-1.5 MG/5ML PO SOLN: 5.0000 mL | Freq: Four times a day (QID) | ORAL | 0 refills | Status: DC | PRN

## 2023-07-17 ENCOUNTER — Telehealth: Payer: 59 | Admitting: Pulmonary Disease

## 2023-07-17 ENCOUNTER — Encounter: Payer: Self-pay | Admitting: Pulmonary Disease

## 2023-07-17 DIAGNOSIS — Z8616 Personal history of COVID-19: Secondary | ICD-10-CM | POA: Diagnosis not present

## 2023-07-17 DIAGNOSIS — J849 Interstitial pulmonary disease, unspecified: Secondary | ICD-10-CM

## 2023-07-17 DIAGNOSIS — R059 Cough, unspecified: Secondary | ICD-10-CM

## 2023-07-17 MED ORDER — PREDNISONE 10 MG PO TABS: 40.0000 mg | ORAL_TABLET | Freq: Every day | ORAL | 0 refills | Status: AC

## 2023-07-17 MED ORDER — AMOXICILLIN-POT CLAVULANATE 875-125 MG PO TABS: 1.0000 | ORAL_TABLET | Freq: Two times a day (BID) | ORAL | 0 refills | Status: AC

## 2023-07-17 MED ORDER — HYDROCODONE BIT-HOMATROP MBR 5-1.5 MG/5ML PO SOLN: 5.0000 mL | Freq: Four times a day (QID) | ORAL | 0 refills | Status: DC | PRN

## 2023-07-17 NOTE — Addendum Note (Signed)
Addended byChilton Greathouse on: 07/17/2023 02:01 PM   Modules accepted: Orders

## 2023-07-17 NOTE — Progress Notes (Signed)
[name]    [id number]    03-Dec-1975  Primary Care Physician:Skakle, Eliberto Ivory DO  Referring Physician: Charlane Ferretti, DO [address] [place],  [place] [address]  Virtual Visit via Video Note  I connected with [name] on 07/17/23 at  1:00 PM EST by a video enabled telemedicine application and verified that I am speaking with the correct person using two identifiers.  Location: Patient: Home Provider: Office   I discussed the limitations of evaluation and management by telemedicine and the availability of in person appointments. The patient expressed understanding and agreed to proceed.  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  with history of fibromyalgia, depression, anxiety Referred for evaluation of abnormal CT scan showing alternate pattern, NSIP, ILD and August 2020  She underwent bronchoscopy on 07/12/2019 with findings of lymphocytosis on BAL and granulomas. 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 Marland Kitchen She was restarted on prednisone post COVID due to worsening dyspnea.  Given persistent symptoms she had surgical lung biopsy done on 12/12/19 with discussion at multidisciplinary conference showing features suggestive of chronic hypersensitivity pneumonitis with heterogeneous fibrosis and superimposed subacute lung injury. She was eventually tapered off steroids in 2021  In the interim she has followed up with Dr. 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    Revaluated in 2022 for worsening interstitial changes with fibrosis and groundglass opacities.  She did not respond to a trial of steroids.  Given worsening CT findings after discussion with her started CellCept however within a few weeks she had a flareup of her rosacea and CellCept stopped.    Started on azathioprine in January 2023.  Labs around that time showed elevated ANA, aldolase.  Evaluated by Dr. Dierdre Forth, rheumatology and may have 2023 and felt to have low suspicion for systemic autoimmune disease.  Pets: Has dogs.  No birds, farm animals Occupation: Works as a [employer] account planner.  Currently working from home Exposures: No mold, hot tub, Jacuzzi.  She has a down comforter for many years and got a down pillow in 2019. She got rid of the down after bronchoscopy in late 2020 Smoking history: No significant smoking history Travel history: Previously lived in [place], [place].  Has been living in [place] for the past 18 years.  No significant recent travel Relevant family history: Grandfather died of lung cancer.  No other significant family history of lung disease.  Interim history:  Discussed the use of AI scribe software for clinical note transcription with the patient, who gave verbal consent to proceed.  The patient, with a history of interstitial lung disease managed with azathioprine and Ofev, presents with a persistent cough unresponsive to albuterol. She reports using the inhaler frequently, with 'ten hits' in one morning alone, but it 'just wouldn't stop the coughing.' She also reports congestion and phlegm production. She denies recent use of prednisone.  In addition to the respiratory symptoms, she is on doxycycline 100mg  daily for ocular rosacea, which she reports causes excessive dry eye  and weeping. She notes that if she needs to start a Z-Pak, she usually discontinue the doxycycline.   Outpatient Encounter Medications as of 07/17/2023  Medication  Sig   acetaminophen (TYLENOL) 650 MG CR tablet Take 650 mg by mouth every 8 (eight) hours as needed for pain.   azaTHIOprine (IMURAN) 50 MG tablet TAKE 1 AND 1/2 TABLETS BY MOUTH DAILY   azithromycin (ZITHROMAX) 250 MG tablet Take as directed   azithromycin (ZITHROMAX) 250 MG tablet Take as directed   DULoxetine (CYMBALTA) 60 MG capsule TAKE 1 CAPSULE BY MOUTH EVERY DAY (Patient taking differently: Take 60 mg by mouth daily.)   ferrous sulfate 324 MG TBEC Take 324 mg by mouth.   HYDROcodone bit-homatropine (HYCODAN) 5-1.5 MG/5ML syrup Take 5 mLs by mouth every 6 (six) hours as needed for cough.   HYDROcodone bit-homatropine (HYCODAN) 5-1.5 MG/5ML syrup Take 5 mLs by mouth every 6 (six) hours as needed for cough.   hydroxychloroquine (PLAQUENIL) 200 MG tablet Take 200 mg by mouth daily.   ipratropium-albuterol (DUONEB) 0.5-2.5 (3) MG/3ML SOLN Take 3 mLs by nebulization every 6 (six) hours as needed.   levalbuterol (XOPENEX HFA) 45 MCG/ACT inhaler INHALE 2 PUFFS INTO THE LUNGS EVERY 4 HOURS AS NEEDED FOR WHEEZE   levothyroxine (SYNTHROID) 125 MCG tablet Take 125 mcg by mouth daily before breakfast.   metFORMIN (GLUCOPHAGE-XR) 500 MG 24 hr tablet Take 500 mg by mouth in the morning and at bedtime. 1000mg  total daily   pantoprazole (PROTONIX) 40 MG tablet TAKE 1 TABLET BY MOUTH EVERY DAY   predniSONE (DELTASONE) 20 MG tablet Take 40mg  for 5 days   pregabalin (LYRICA) 150 MG capsule Take 1 capsule (150 mg total) by mouth 2 (two) times daily. (Patient taking differently: Take 150 mg by mouth in the morning, at noon, and at bedtime.)   Spacer/Aero-Holding Chambers (AEROCHAMBER MV) inhaler Use as instructed   No facility-administered encounter medications on file as of 07/17/2023.   Physical Exam: Tele  Data Reviewed: Imaging: CTA 06/15/2019-no pulmonary embolism, mild diffuse interstitial prominence with bilateral groundglass and nodular airspace opacities.    High-res CT 07/11/2019-Bilateral  fine nodularity with groundglass with no significant air trapping.  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  High-res CT 11/02/2019-subpleural reticulation with groundglass, traction bronchiectasis no basal gradient.  Indeterminate for UIP.  High-res CT 05/09/2020-stable interstitial lung disease  High-res CT 11/02/2020-minimal progression of fibrotic lung disease with groundglass opacities  High-res CT 01/08/2021-stable ILD compared to March 2022  High-res CT 07/04/2021-significantly increased groundglass opacities with progression of fibrotic changes.    CT high-resolution 06/19/2022-stable pattern of groundglass opacities, fibrotic changes in alternate diagnosis pattern.  Favored to be fibrotic phase of NSIP. I have reviewed the images personally.  PFTs: [facility] 11/04/2019 FVC 2.53 [68%], FEV1 2.26 [75%], F/F 89, SVC 1.75 [50%], DLCO 14.61 [65%] Moderate restriction, diffusion defect.  11/16/2020 FVC 2.14 [5%), FEV1 1.95 (62%], F/F 91, TLC 2.24 (41%), DLCO 16.02 (69%] Severe restriction, mild diffusion defect.  01/17/2021 FVC 2.38 (61%), FEV1 2.12 (68%), F/F 89, TLC 3.86 [72%), DLCO 14.60 (63%) Severe restriction, mild diffusion defect  6-minute walk 12/27/2020- 363 m  Labs: ANA 03/25/2019-negative,  Rheumatoid factor 03/25/2019- < 14 Repeat CTD serologies 07/18/2019-negative, hypersensitivity panel-negative  CBC 06/15/2019-WBC 5.87, eos 6.4%, absolute eosinophil count 376 Metabolic panel 06/15/2019-within normal limits D-dimer 06/30/2019-0.41 BNP 06/30/2019-10.   Bronchoscopy 07/12/2019 WBC 510, lymphocytes 38%, eos 12%, Microbiology-negative Cytology -no malignancy Transbronchial biopsy-nonnecrotizing epithelioid granulomas SARS-CoV-2, viral PCR-negative  Surgical lung biopsy 12/12/2019- features suggestive  of chronic hypersensitivity pneumonitis with heterogeneous fibrosis and superimposed subacute lung injury.  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  Surgical lung biopsy discussed at our conference and [facility] agree with diagnosis of chronic hypersensitivity pneumonitis, post Covid ILD  Due to worsening interstitial changes she is now on Imuran, Ofev and is undergoing transplant evaluation Has not tolerated CellCept in the past due to rosacea  Plan/Recommendations: - Continue azathioprine, Ofev - Augmentin, prednisone, Hycodan    I discussed the assessment and treatment plan with the patient. The patient was provided an opportunity to ask questions and all were answered. The patient agreed with the plan and demonstrated an understanding of the instructions.   The patient was advised to call back or seek an in-person evaluation if the symptoms worsen or if the condition fails to improve as anticipated.  Chilton Greathouse MD [facility] 07/17/2023, 1:14 PM  CC: Charlane Ferretti, DO

## 2023-07-17 NOTE — Patient Instructions (Signed)
VISIT SUMMARY:  During today's visit, we discussed your persistent cough and congestion, which have not improved with your current inhaler use. We also reviewed your ongoing treatment for interstitial lung disease and ocular rosacea.  YOUR PLAN:  -INTERSTITIAL LUNG DISEASE: Interstitial lung disease is a group of disorders that cause scarring of the lungs, making it difficult to breathe. Your condition appears stable on azathioprine and Ofev, but the recent increase in coughing and congestion may indicate bronchitis or pneumonia. We are starting you on Augmentin and Prednisone for 7 days to address these symptoms. Please reevaluate your symptoms after the weekend and contact the office if they worsen or do not improve.  -ASTHMA: Asthma is a condition where your airways narrow and swell, causing difficulty in breathing. You have been using your Albuterol inhaler more frequently with minimal relief. Continue using Albuterol as needed, and monitor your symptoms closely.  -OCULAR ROSACEA: Ocular rosacea is a condition that causes redness, burning, and itching of the eyes. Your condition is stable on Doxycycline 100mg  daily. Continue taking Doxycycline as prescribed.  -GENERAL HEALTH MAINTENANCE: Continue monitoring your symptoms and report any changes or worsening to the office. Maintain your current medications and follow the prescribed treatment plan.  INSTRUCTIONS:  Please start taking Augmentin and Prednisone for 7 days as prescribed. Reevaluate your symptoms after the weekend and contact the office if they worsen or do not improve.

## 2023-07-21 ENCOUNTER — Other Ambulatory Visit: Payer: Self-pay | Admitting: Pulmonary Disease

## 2023-07-26 ENCOUNTER — Other Ambulatory Visit: Payer: Self-pay | Admitting: Pulmonary Disease

## 2023-07-27 NOTE — Telephone Encounter (Signed)
Refill sent for  [facility]athioprine  to  [facility]  Dose: 150mg  once daily  Last OV: 07/17/23 (video visit) Provider: [name]  CMP and CMP on 07/08/23 per [facility] - wnl  Routing to scheduling team for follow-up on appt scheduling  Chesley Mires, PharmD, MPH, BCPS Clinical Pharmacist (Rheumatology and Pulmonology)

## 2023-09-18 ENCOUNTER — Other Ambulatory Visit: Payer: Self-pay | Admitting: Pulmonary Disease

## 2023-09-21 NOTE — Telephone Encounter (Signed)
Last filled on 05/26/23  Last appt 11/24

## 2023-09-22 ENCOUNTER — Telehealth: Payer: Self-pay | Admitting: Pulmonary Disease

## 2023-09-22 DIAGNOSIS — J849 Interstitial pulmonary disease, unspecified: Secondary | ICD-10-CM

## 2023-09-22 NOTE — Telephone Encounter (Signed)
Patient needs refill on medication hydrocodone.  [facility]

## 2023-09-23 MED ORDER — HYDROCODONE BIT-HOMATROP MBR 5-1.5 MG/5ML PO SOLN: 5.0000 mL | Freq: Four times a day (QID) | ORAL | 0 refills | Status: AC | PRN

## 2023-09-23 NOTE — Telephone Encounter (Signed)
I called and spoke with the pt. Pt states she wants a refill on Hycodan 5ml. Pt states she is having a dry unproductive cough. Pt states she has ILD. She has it last filled 07-17-23. Pt states she usually see's [facility] but has recently been unable to go to [facility] due to an hour drive. Pt states she has been approved for a lung transplant. Pt has 1-2 doses left. She states she does not abuse this and has never had addiction issues with this before. Pt verbalized that she was very upset that she called in last Thursday and we ar now contacting her about this.

## 2023-09-23 NOTE — Telephone Encounter (Signed)
Significant ILD; undergoing lung transplant evaluation at [facility]. Last seen 07/17/2023 by [name]. He is off today. PDMP reviewed; last fill 07/17/2023. No history of aberrant behavior. I have sent a refill of her Hycodan. Thanks. Please apologize for the delay.

## 2023-09-24 NOTE — Telephone Encounter (Signed)
Pt notified, NFN

## 2023-09-30 MED ORDER — HYDROCODONE BIT-HOMATROP MBR 5-1.5 MG/5ML PO SOLN: 5.0000 mL | Freq: Four times a day (QID) | ORAL | 0 refills | Status: AC | PRN

## 2023-10-03 IMAGING — CT CT CHEST HIGH RESOLUTION
2 of 5 series · 14 of 36 positions shown, 17 images · non-contrast
Comparison: 01/08/2021, 11/02/2020, 05/09/2020, 01/27/2020,
11/02/2019, 07/01/2019

CLINICAL DATA: Interstitial lung disease, worsening shortness of
breath

EXAM:
CT CHEST WITHOUT CONTRAST
TECHNIQUE: Multidetector CT imaging of the chest was performed following the
standard protocol without intravenous contrast. High resolution
imaging of the lungs, as well as inspiratory and expiratory imaging,
was performed.

[Series 4: thorax · axial · 0.71mm/px · z∈[-216,+10]mm · 11 of 125 slices shown, 14 images]
[im 6/125  mediastinal]
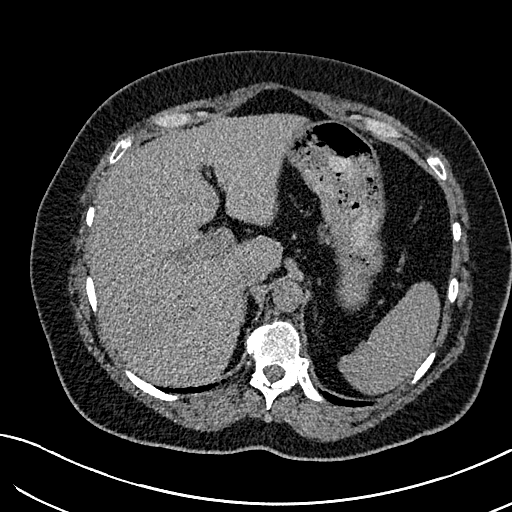
[im 6/125  lung]
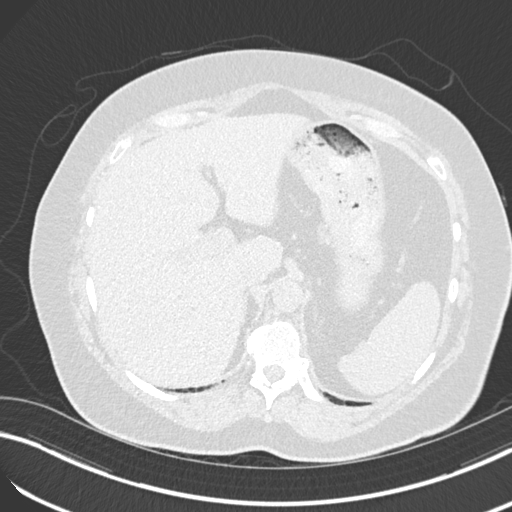
[im 18/125  lung]
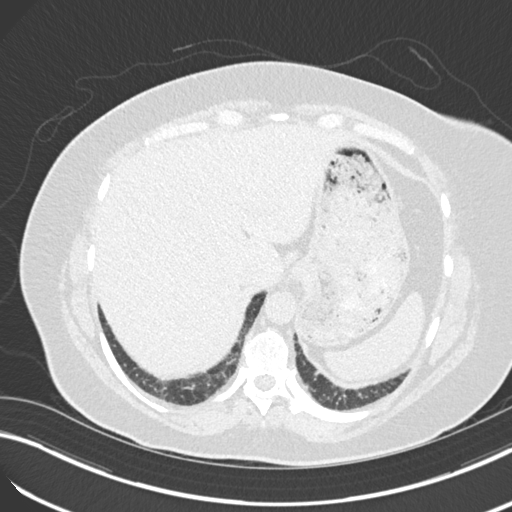
[im 30/125  lung]
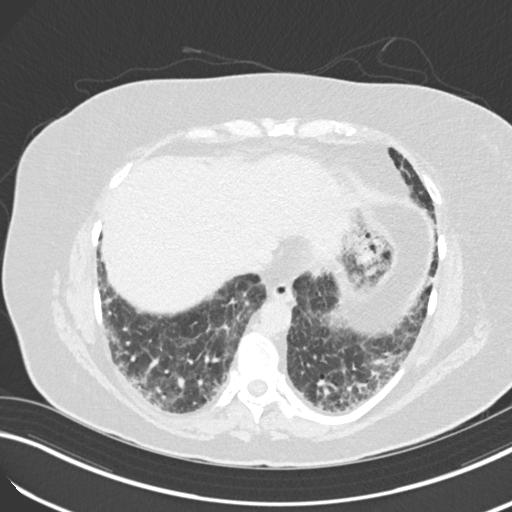
[im 42/125  lung]
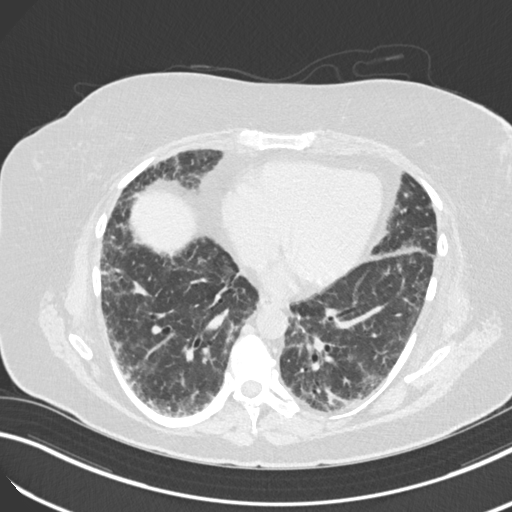
[im 54/125  mediastinal]
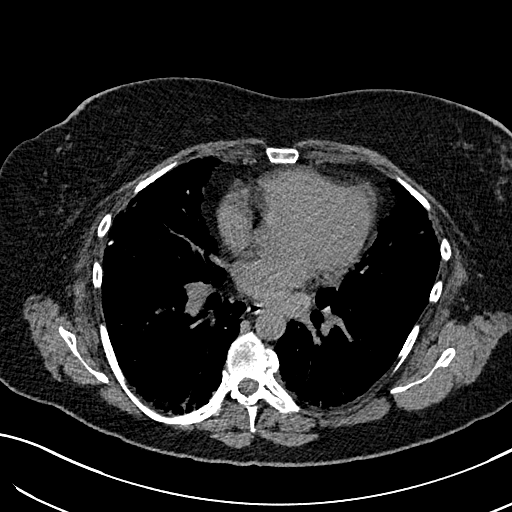
[im 54/125  lung]
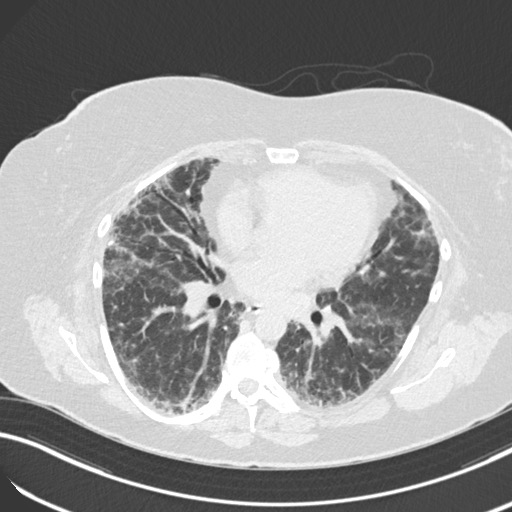
[im 65/125  lung]
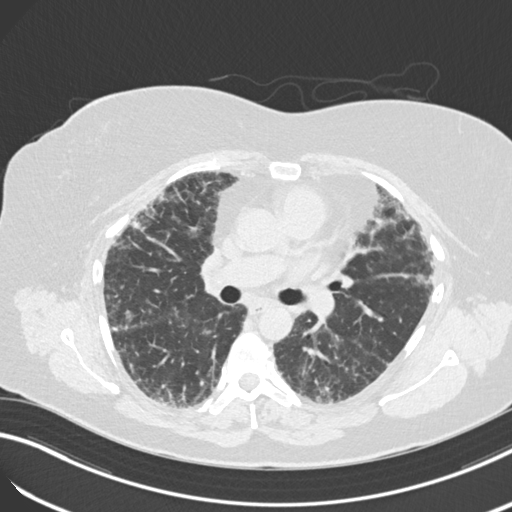
[im 71/125  lung]
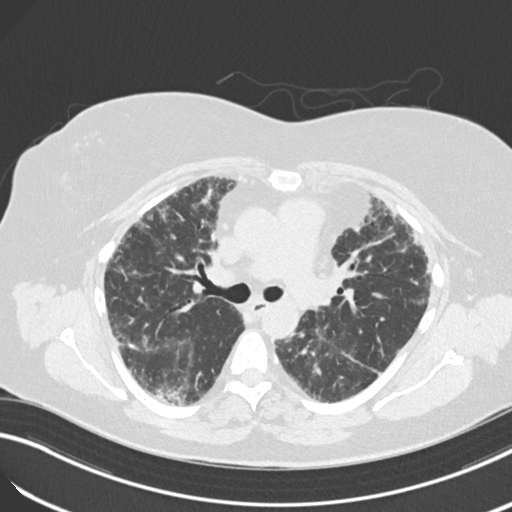
[im 83/125  lung]
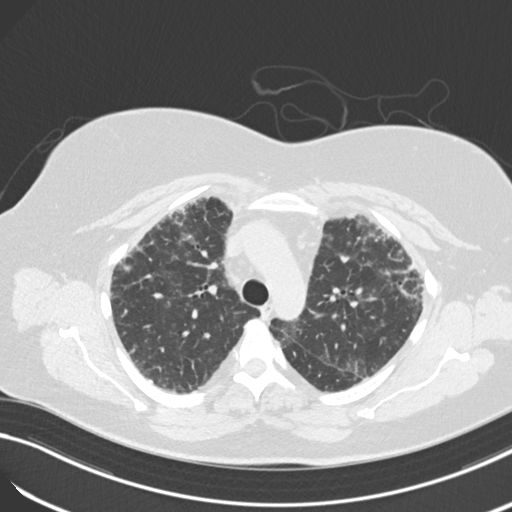
[im 95/125  mediastinal]
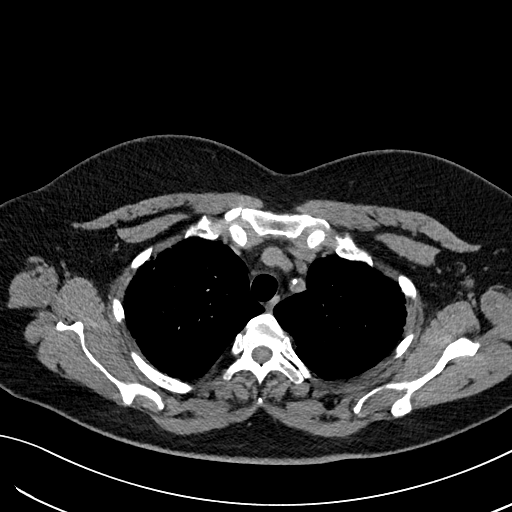
[im 95/125  lung]
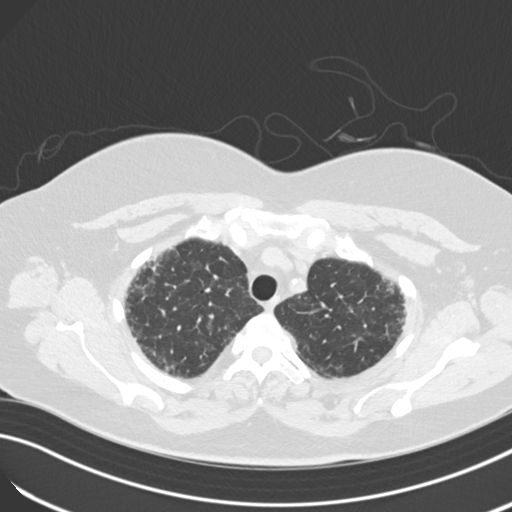
[im 107/125  lung]
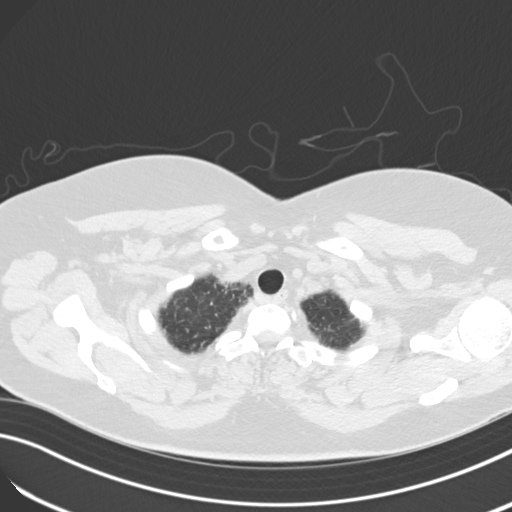
[im 119/125  lung]
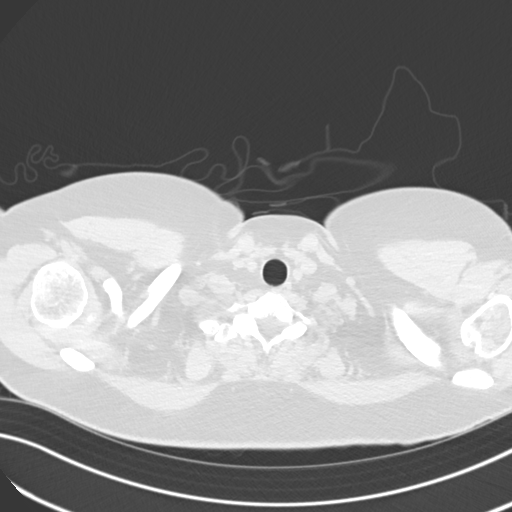

[Series 8: coronal · coronal · 0.53mm/px · 3 of 151 slices shown]
[im 31/151  lung]
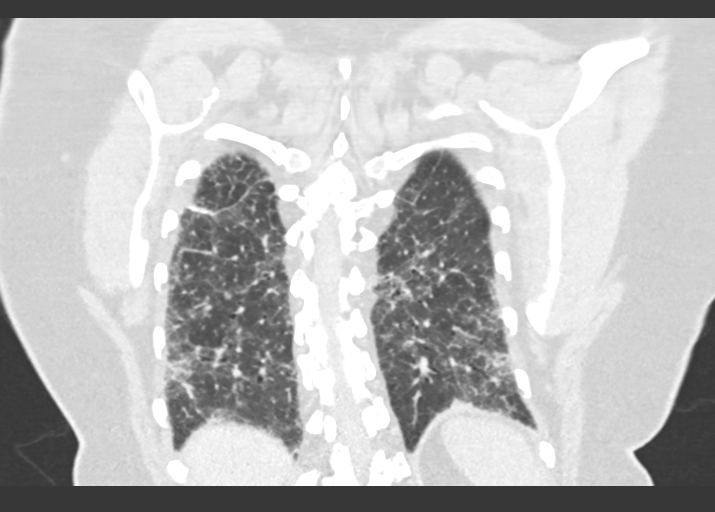
[im 61/151  lung]
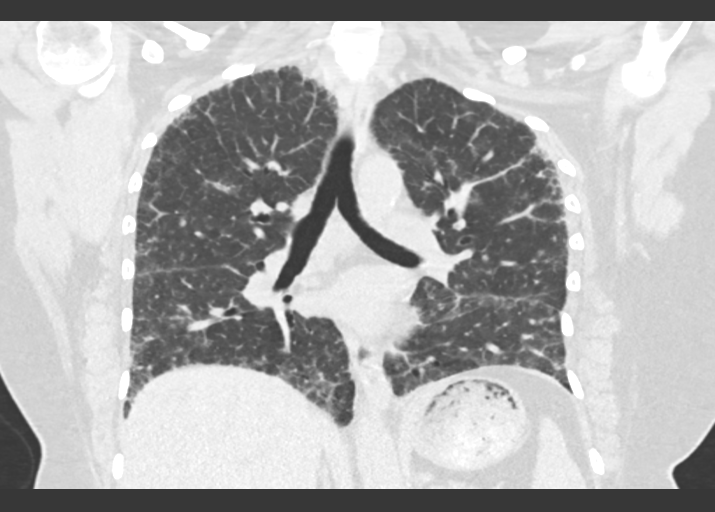
[im 91/151  lung]
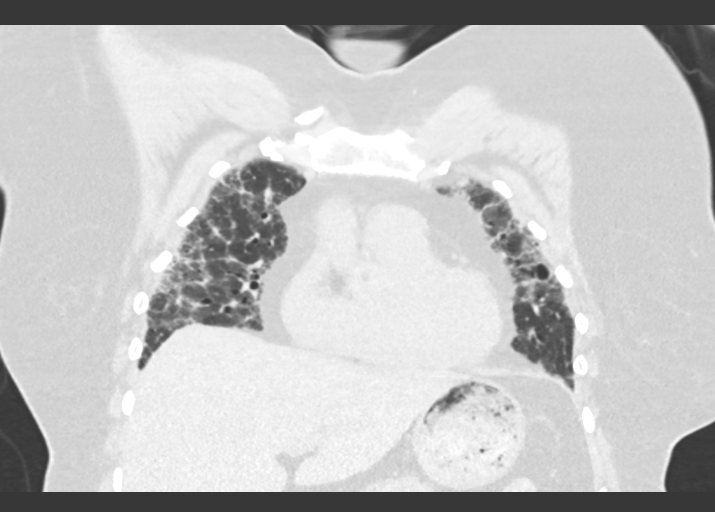

[14 of 36 positions shown; findings below may reference images not displayed]

FINDINGS: Cardiovascular: No significant vascular findings. Normal heart size.
No pericardial effusion.

Mediastinum/Nodes: No enlarged mediastinal, hilar, or axillary lymph
nodes. Thyroid gland, trachea, and esophagus demonstrate no
significant findings.

Lungs/Pleura: Redemonstrated moderate pulmonary fibrosis, in a
pattern with apical to basal gradient, featuring irregular
peripheral interstitial opacity, septal thickening, extensive
associated ground-glass, and areas of subpleural bronchiolectasis at
the lung bases. Fibrotic findings are appreciably worsened compared
to immediate prior examination dated 01/08/2021, particularly with
an increase in associated ground-glass. No significant air trapping
on expiratory phase imaging. Right lung wedge resections. No pleural
effusion or pneumothorax.

Upper Abdomen: No acute abnormality.

Musculoskeletal: No chest wall mass or suspicious bone lesions
identified.
IMPRESSION: 1. Redemonstrated moderate pulmonary fibrosis, in a pattern with
apical to basal gradient, featuring irregular peripheral
interstitial opacity, septal thickening, extensive associated
ground-glass, and areas of subpleural bronchiolectasis at the lung
bases. Fibrotic findings are appreciably worsened compared to
immediate prior examination dated 01/08/2021, particularly with an
increase in associated ground-glass.

2. Presence of significantly increased ground-glass suggests acutely
superimposed infection or inflammatory flare.

3. Imaging findings again remain difficult to interpret, overall
pattern of fibrosis and evidence of significant progression over
time suggesting UIP, however preponderance of ground-glass
suggesting alternative diagnoses such as NSIP. Findings are
suggestive of an alternative diagnosis (not UIP) per consensus
guidelines: Diagnosis of Idiopathic Pulmonary Fibrosis: An Official
ATS/ERS/JRS/ALAT Clinical Practice Guideline. Am J Respir Crit Care
Med Vol 198, [place] 5, ppe66-[id number][DATE].

## 2023-11-09 DIAGNOSIS — J8489 Other specified interstitial pulmonary diseases: Secondary | ICD-10-CM | POA: Insufficient documentation

## 2023-11-11 DIAGNOSIS — Z942 Lung transplant status: Secondary | ICD-10-CM | POA: Insufficient documentation

## 2023-11-11 DIAGNOSIS — G8918 Other acute postprocedural pain: Secondary | ICD-10-CM | POA: Insufficient documentation

## 2023-11-12 DIAGNOSIS — I10 Essential (primary) hypertension: Secondary | ICD-10-CM | POA: Insufficient documentation

## 2023-11-19 DIAGNOSIS — D849 Immunodeficiency, unspecified: Secondary | ICD-10-CM | POA: Insufficient documentation

## 2023-11-19 DIAGNOSIS — R7401 Elevation of levels of liver transaminase levels: Secondary | ICD-10-CM | POA: Insufficient documentation

## 2023-12-15 DIAGNOSIS — E875 Hyperkalemia: Secondary | ICD-10-CM | POA: Insufficient documentation

## 2024-03-22 DIAGNOSIS — R1312 Dysphagia, oropharyngeal phase: Secondary | ICD-10-CM | POA: Insufficient documentation

## 2024-04-12 ENCOUNTER — Ambulatory Visit (INDEPENDENT_AMBULATORY_CARE_PROVIDER_SITE_OTHER): Admitting: Podiatry

## 2024-04-12 DIAGNOSIS — K589 Irritable bowel syndrome without diarrhea: Secondary | ICD-10-CM | POA: Insufficient documentation

## 2024-04-12 DIAGNOSIS — E669 Obesity, unspecified: Secondary | ICD-10-CM | POA: Insufficient documentation

## 2024-04-12 DIAGNOSIS — L03031 Cellulitis of right toe: Secondary | ICD-10-CM | POA: Diagnosis not present

## 2024-04-12 DIAGNOSIS — L732 Hidradenitis suppurativa: Secondary | ICD-10-CM | POA: Insufficient documentation

## 2024-04-12 DIAGNOSIS — Z8742 Personal history of other diseases of the female genital tract: Secondary | ICD-10-CM | POA: Insufficient documentation

## 2024-04-12 MED ORDER — NEOMYCIN-POLYMYXIN-HC 1 % OT SOLN: OTIC | 1 refills | Status: AC

## 2024-04-12 NOTE — Patient Instructions (Signed)

## 2024-04-13 NOTE — Progress Notes (Signed)
 Subjective:  Patient ID: [name], female    DOB: August 06, 1976,  MRN: [id number] HPI Chief Complaint  Patient presents with   Toe Pain    Hallux right - medial border, ingrown x 3-4 weeks, pedicurist trimmed and pulled a piece of nail out, transplant patient, has been cleaning and soaking   New Patient (Initial Visit)    48 y.o. female presents with the above complaint.   ROS: Denies fever chills nausea mobic  muscle aches pains calf pain back pain chest pain shortness of breath.  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  She presents wearing a mask stating that she has pretty much no immune system.    Past Medical History:  Diagnosis Date   Anxiety    Arthritis    Depression    Diabetes mellitus without complication (HCC)    Dyspnea    Fibromyalgia    GERD (gastroesophageal reflux disease)    Headache    migraines   Hypothyroidism 1999   post PTU Rx for hyperthyroidism   IBS (irritable bowel syndrome)    Interstitial lung disease (HCC)    Pneumonia 06/2019   Double PNA   Past Surgical History:  Procedure Laterality Date   CESAREAN SECTION     x 2   INTERCOSTAL NERVE BLOCK Right 12/12/2019   Procedure: Intercostal Nerve Block;  Surgeon: Army Dallas NOVAK, MD;  Location: [facility] OR;  Service: Thoracic;  Laterality: Right;   VIDEO BRONCHOSCOPY Bilateral 07/12/2019   Procedure: VIDEO BRONCHOSCOPY WITH FLUORO;  Surgeon: Mannam, Praveen, MD;  Location: [facility] ENDOSCOPY;  Service: Cardiopulmonary;  Laterality: Bilateral;    Current Outpatient Medications:    Continuous Glucose Sensor (DEXCOM G7 SENSOR) MISC, 1 each by Other route., Disp: , Rfl:    GLOBAL EASY GLIDE INSULIN  SYR 31G X 15/64 0.5 ML MISC, 1 Syringe by Other route., Disp: , Rfl:    hydrOXYzine (VISTARIL) 25 MG capsule, TAKE 1 CAPSULE BY MOUTH 3 TIMES DAILY AS NEEDED FOR ANXIETY., Disp: , Rfl:    JARDIANCE 10 MG TABS tablet, Take 10 mg by mouth  daily., Disp: , Rfl:    midodrine (PROAMATINE) 5 MG tablet, Take by mouth., Disp: , Rfl:    mycophenolate  (CELLCEPT ) 250 MG capsule, TAKE 4 CAPSULES (1,000 MG TOTAL) BY MOUTH EVERY 12 (TWELVE) HOURS, Disp: , Rfl:    NEOMYCIN -POLYMYXIN-HYDROCORTISONE (CORTISPORIN) 1 % SOLN OTIC solution, Apply 1-2 drops to toe BID after soaking, Disp: 10 mL, Rfl: 1   NOVOLIN R 100 UNIT/ML injection, , Disp: , Rfl:    NOVOLIN R FLEXPEN RELION 100 UNIT/ML FlexPen, INJECT 10-20U UNDER SKIN BEFORE MEALS 18U BREAKFAST 10U LUNCH 12U DINNER PLUS CORRECTION Carrol Hougland 60U/DAY, Disp: , Rfl:    omeprazole  (PRILOSEC) 20 MG capsule, Take 20 mg by mouth., Disp: , Rfl:    PRECISION QID TEST test strip, 1 strip., Disp: , Rfl:    SENEXON-S 8.6-50 MG tablet, TAKE 2 TABLETS BY MOUTH 2 TIMES DAILY AS NEEDED FOR CONSTIPATION., Disp: , Rfl:    tacrolimus (PROGRAF) 0.5 MG capsule, Take six capsules (3 mg total dose) by mouth every AM & take five capsules (2.5 mg total dose) by mouth every PM., Disp: , Rfl:    tiZANidine (ZANAFLEX) 4 MG tablet, TAKE HALF TO 1 TABLET (2-4 MG TOTAL) BY MOUTH NIGHTLY AS NEEDED FOR MUSCLE SPASMS., Disp: , Rfl:    valGANciclovir (VALCYTE) 450 MG tablet, TAKE 2 TABLETS (900 MG TOTAL) BY MOUTH ONCE  DAILY, Disp: , Rfl:    VELTASSA 8.4 g packet, Take 1 packet by mouth daily., Disp: , Rfl:    acetaminophen  (TYLENOL ) 650 MG CR tablet, Take 650 mg by mouth every 8 (eight) hours as needed for pain., Disp: , Rfl:    azaTHIOprine  (IMURAN ) 50 MG tablet, TAKE 3 TABLETS BY MOUTH EVERY DAY, Disp: 270 tablet, Rfl: 1   azithromycin  (ZITHROMAX ) 250 MG tablet, Take as directed, Disp: 6 tablet, Rfl: 0   azithromycin  (ZITHROMAX ) 250 MG tablet, Take as directed, Disp: 6 tablet, Rfl: 0   clobetasol ointment (TEMOVATE) 0.05 %, APPLY THIN COAT TO AFFECTED AREA TWICE A DAY, Disp: , Rfl:    DULoxetine  (CYMBALTA ) 60 MG capsule, TAKE 1 CAPSULE BY MOUTH EVERY DAY (Patient taking differently: Take 60 mg by mouth daily.), Disp: 90 capsule, Rfl:  2   estradiol (ESTRACE) 0.1 MG/GM vaginal cream, INSERT 1 G TWICE A WEEK BY VAGINAL ROUTE AT BEDTIME FOR 90 DAYS. 42.5G=90DAY, Disp: , Rfl:    ferrous sulfate 324 MG TBEC, Take 324 mg by mouth., Disp: , Rfl:    HYDROcodone  bit-homatropine (HYCODAN) 5-1.5 MG/5ML syrup, Take 5 mLs by mouth every 6 (six) hours as needed for cough., Disp: 240 mL, Rfl: 0   HYDROcodone  bit-homatropine (HYCODAN) 5-1.5 MG/5ML syrup, Take 5 mLs by mouth every 6 (six) hours as needed for cough., Disp: 280 mL, Rfl: 0   hydroxychloroquine (PLAQUENIL) 200 MG tablet, Take 200 mg by mouth daily., Disp: , Rfl:    ipratropium-albuterol  (DUONEB) 0.5-2.5 (3) MG/3ML SOLN, Take 3 mLs by nebulization every 6 (six) hours as needed., Disp: 180 mL, Rfl: 5   levalbuterol  (XOPENEX  HFA) 45 MCG/ACT inhaler, INHALE 2 PUFFS INTO THE LUNGS EVERY 4 HOURS AS NEEDED FOR WHEEZE, Disp: 15 each, Rfl: 12   levothyroxine  (SYNTHROID ) 125 MCG tablet, Take 125 mcg by mouth daily before breakfast., Disp: , Rfl:    metFORMIN  (GLUCOPHAGE -XR) 500 MG 24 hr tablet, Take 500 mg by mouth in the morning and at bedtime. 1000mg  total daily, Disp: , Rfl:    methocarbamol (ROBAXIN) 500 MG tablet, TAKE 1 TABLET (500 MG TOTAL) BY MOUTH 4 (FOUR) TIMES DAILY FOR 30 DAYS, Disp: , Rfl:    pantoprazole  (PROTONIX ) 40 MG tablet, TAKE 1 TABLET BY MOUTH EVERY DAY, Disp: 90 tablet, Rfl: 1   predniSONE  (DELTASONE ) 20 MG tablet, Take 40mg  for 5 days, Disp: 10 tablet, Rfl: 0   pregabalin  (LYRICA ) 150 MG capsule, Take 1 capsule (150 mg total) by mouth 2 (two) times daily. (Patient taking differently: Take 150 mg by mouth in the morning, at noon, and at bedtime.), Disp: 60 capsule, Rfl: 3   propranolol (INDERAL) 10 MG tablet, TAKE 0.5 TABLET (5 MG TOTAL) BY MOUTH EVERY 8 (EIGHT) HOURS, Disp: , Rfl:    Spacer/Aero-Holding Chambers (AEROCHAMBER MV) inhaler, Use as instructed, Disp: 1 each, Rfl: 0  Allergies  Allergen Reactions   Bupropion  Other (See Comments)    Body aches / headaches   Other reaction(s): Dizziness, Headache   Methimazole Other (See Comments)    Body goes into arthritic shock   Latex     Other Reaction(s): Unknown   Tetracycline     Other Reaction(s): Unknown   Review of Systems Objective:  There were no vitals filed for this visit.  General: Well developed, nourished, in no acute distress, alert and oriented x3   Dermatological: Skin is warm, dry and supple bilateral. Nails x 10 are well maintained; remaining integument appears unremarkable at this  time. There are no open sores, no preulcerative lesions, no rash or signs of infection present.  The hallux right tibial border does demonstrate an area of erythema and drainage distally.  This is for a paronychia or ingrown is present.  Exquisitely painful on palpation.  Vascular: Dorsalis Pedis artery and Posterior Tibial artery pedal pulses are 2/4 bilateral with immedate capillary fill time. Pedal hair growth present. No varicosities and no lower extremity edema present bilateral.   Neruologic: Grossly intact via light touch bilateral. Vibratory intact via tuning fork bilateral. Protective threshold with Semmes Wienstein monofilament intact to all pedal sites bilateral. Patellar and Achilles deep tendon reflexes 2+ bilateral. No Babinski or clonus noted bilateral.   Musculoskeletal: No gross boney pedal deformities bilateral. No pain, crepitus, or limitation noted with foot and ankle range of motion bilateral. Muscular strength 5/5 in all groups tested bilateral.  Gait: Unassisted, Nonantalgic.    Radiographs:  None taken  Assessment & Plan:   Assessment: Paronychia ingrown nail tibial border hallux right  Plan: An I&D was performed with removal of the medial nail border.  After local anesthetic was administered and the toe was prepped in its normal sterile fashion I then used sterile instrumentation to remove a very thin sliver of the intrusive nail and infected skin.  No chemical cautery was  utilized.  The area was flushed with alcohol Silvadene cream was placed sparingly and a dry Telfa pad and gauze was placed with compression dressing.  She will start soaking this tomorrow as she was given soaking instructions.  We also prescribed a Cortisporin Otic to be applied to the margin.  I will follow-up with her in 1 to 2 weeks just to make sure she is doing well.  She will notify us  immediately should she feel this is worsening.     Dorrell Mitcheltree T. [place], [place]

## 2024-05-05 ENCOUNTER — Ambulatory Visit (INDEPENDENT_AMBULATORY_CARE_PROVIDER_SITE_OTHER): Admitting: Podiatry

## 2024-05-05 ENCOUNTER — Encounter: Payer: Self-pay | Admitting: Podiatry

## 2024-05-05 DIAGNOSIS — L03031 Cellulitis of right toe: Secondary | ICD-10-CM

## 2024-05-05 NOTE — Progress Notes (Signed)
 She presents today for follow-up of her I&D tibial border hallux right.  She states that she has been soaking until just the other day.  States that it looks fine does not hurt anymore she is very happy with the outcome.  Objective: Vital signs stable oriented x 3 there is no erythema edema salines drainage or odor no regrowth on the margin at this time.  Assessment: Healed surgical toe hallux right tibial border.  Plan: Follow-up with me on an as-needed basis.

## 2024-06-14 NOTE — Progress Notes (Signed)
 Pulmonary Post Lung Transplant Follow Up     Chief Complaint: Evaluation and management of lung allograft and immunosuppresion status   No chief complaint on file.  Subjective:  Patient ID: [name] is a 48 y.o. 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  She comes to clinic today for evaluation and management of her lung allograft and immunosuppression.  Post transplant course has been notable for - BOLT 11/09/23 on [facility]-ECHO, no products needed in the OR - Extubated 3/18, transferred to SDU 3/21 - Hyponatremia 2/2 SIADH- needing urea and fluid restriction while inpatient - Pain management- cryoablation not very effective, needing multiple dose adjustments / medication adjustments. 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 S/p pigtail chest tube placement 4/10, VATS/ washout 4/16. 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  Her wound has not been evaluated by a physician since her discharge.  She was being seen by home health nurse who was changing her wound vac every M-W-F and LUE PICC dressing changes every Wednesday.  She was on daptomycin but developed severe muscle cramping and pain (started in her forearms but spread diffusely), joint pain and dizziness.  Her CPK was elevated to 445 (06/10/24) consistent with rhabdomyolysis and leukopenia (WBC 1.7k) from daptomycin.    Her Dilaudid  dose was reduced on 06/10/24 from 6 mg q4 hours to 2 mg q4 hours but she was in too much pain to decrease by this much. She is picking up 6 mg q4 hours here at [facility] today.    No fever.  Her breathing sometimes feels labored and other times feels good.  Her PFTs are stable today.    Her blood sugars have been running high in light of the last week's events of daptomycin effect.   Immunosuppression: tacrolimus 6.5 mg / 6.5 mg (9-10a/10-11p), Cellcept  on hold, hydrocortisone 30 mg/10 mg, Bactrim  SS once daily, valgancyclovir 900 mg, propranolol 2.5 mg once daily  The following portions of the patient's history were reviewed and updated as appropriate: allergies, current medications, past medical history, social history, family history and problem list.  Medications were reconciled with the patient   Current Outpatient Medications:  .  acetaminophen  (TYLENOL ) 500 MG tablet, Take 1 tablet (500 mg total) by mouth every 4 (four) hours as needed for Pain (Patient taking differently: Take 1,000 mg by mouth every 4 (four) hours as needed for Pain), Disp: 100 tablet, Rfl: 0 .  blood glucose diagnostic test strip, 1 each (1 strip total) 4 (four) times daily, Disp: 360 each, Rfl: 3 .  blood glucose  meter kit, as directed Product selection permitted according to insurance preference. E11.9 Type 2 diabetes mellitus, Disp: 1 each, Rfl: 0 .  blood-glucose sensor (DEXCOM G7 SENSOR) Devi, Use 1 each every 10 (ten)  days Use sensor for glucose monitoring, Disp: 9 each, Rfl: 3 .  blood-glucose,receiver,cont (DEXCOM G7 RECEIVER) Misc, Use 1 each as directed Use receiver for glucose monitoring, Disp: 1 each, Rfl: 1 .  DULoxetine  (CYMBALTA ) 30 MG DR capsule, Take 2 capsules (60 mg total) by mouth once daily, Disp: 180 capsule, Rfl: 3 .  empagliflozin (JARDIANCE) 10 mg tablet, Take 1 tablet (10 mg total) by mouth once daily, Disp: 30 tablet, Rfl: 11 .  ergocalciferol , vitamin D2, 1,250 mcg (50,000 unit) capsule, Take 1 capsule (50,000 Units total) by mouth once a week for 90 days, Disp: 4 capsule, Rfl: 2 .  hydrocortisone (CORTEF) 10 MG tablet, Take 3 tablets (30 mg total) by mouth once daily AND 1 tablet (10 mg total) every evening. Do all this for 180 days., Disp: 360 tablet, Rfl: 1 .  hydrOXYzine pamoate (VISTARIL) 25 MG capsule, TAKE 1 CAPSULE BY MOUTH 3 TIMES DAILY AS NEEDED FOR ANXIETY., Disp: 270 capsule, Rfl: 1 .  insulin  regular human 100 unit/mL (3 mL) InPn, 8 units with each meal plus correction 2 units/50mg /dl >799. Max daily dose 50 units., Disp: 3 mL, Rfl: 0 .  insulin  syringe-needle U-100 1/2 mL 31 gauge x 15/64 Syrg, Use 1 Syringe 3 (three) times daily before meals, Disp: 90 each, Rfl: 3 .  lancets, Use 1 each 4 (four) times daily, Disp: 360 each, Rfl: 3 .  levothyroxine  (SYNTHROID ) 125 MCG tablet, Take 1 tablet (125 mcg total) by mouth once daily Take on an empty stomach with a glass of water at least 30-60 minutes before breakfast., Disp: 30 tablet, Rfl: 11 .  magnesium oxide-magnesium amino acid chelate (MG-PLUS-PROTEIN) tablet, Take 3 tablets by mouth 3 (three) times daily (Take 2 hours apart from Tacrolimus), Disp: 120 tablet, Rfl: 11 .  metFORMIN  (GLUCOPHAGE -XR) 500 MG XR tablet, Take 1 tablet (500 mg total) by mouth daily with breakfast, Disp: 30 tablet, Rfl: 0 .  methocarbamol (ROBAXIN-750 ORAL), Take 1,000 mg by mouth 2 (two) times daily, Disp: , Rfl:  .  ondansetron  (ZOFRAN -ODT) 4 MG  disintegrating tablet, Dissolve 1 tablet (4 mg total) by mouth every 8 (eight) hours as needed for Nausea or Vomiting, Disp: 30 tablet, Rfl: 2 .  pen needle, diabetic 31 gauge x 3/16 needle, Use as directed, Disp: 100 each, Rfl: 12 .  pregabalin  (LYRICA ) 75 MG capsule, Take 2 capsules (150 mg total) by mouth 3 (three) times daily, Disp: 180 capsule, Rfl: 11 .  propranoloL (INDERAL) 10 MG tablet, Take 0.5 tablets (5 mg total) by mouth 2 (two) times daily, Disp: 90 tablet, Rfl: 3 .  sennosides-docusate (SENOKOT-S) 8.6-50 mg tablet, Take 2 tablets by mouth 2 (two) times daily as needed for Constipation, Disp: 120 tablet, Rfl: 2 .  sulfamethoxazole -trimethoprim  (BACTRIM  SS) 400-80 mg tablet, Take 1 tablet (80 mg of trimethoprim  total) by mouth once daily, Disp: 90 tablet, Rfl: 3 .  tacrolimus (PROGRAF) 0.5 MG capsule, Take SIX 1 mg capsules + ONE 0.5 mg capsule (6.5mg  total dose) by mouth every AM and take SIX 1 mg capsules + ONE 0.5 mg capsule (6.5mg  total dose) by mouth every PM, Disp: 180 capsule, Rfl: 3 .  tacrolimus (PROGRAF) 1 MG capsule, Take SIX 1 mg capsules + ONE 0.5 mg capsule (6.5mg  total dose) by  mouth every AM and take SIX 1 mg capsules + ONE 0.5 mg capsule (6.5mg  total dose) by mouth every PM, Disp: 1080 capsule, Rfl: 3 .  tiZANidine (ZANAFLEX) 4 MG tablet, TAKE HALF TO 1 TABLET (2-4 MG TOTAL) BY MOUTH NIGHTLY AS NEEDED FOR MUSCLE SPASMS., Disp: , Rfl:  .  valGANciclovir (VALCYTE) 450 mg tablet, Take 2 tablets (900 mg total) by mouth once daily (Patient taking differently: Take 900 mg by mouth at bedtime), Disp: 180 tablet, Rfl: 3 .  HYDROmorphone  (DILAUDID ) 2 MG tablet, Take 3 tablets (6 mg total) by mouth every 4 (four) hours as needed for Pain for up to 5 days, Disp: 54 tablet, Rfl: 0 .  hydrOXYzine pamoate (VISTARIL) 25 MG capsule, Take 25 mg by mouth 3 (three) times daily, Disp: , Rfl:  .  [Paused] mycophenolate  (CELLCEPT ) 250 mg capsule, Take 4 capsules (1,000 mg total) by mouth every  12 (twelve) hours (Patient not taking: Reported on 06/15/2024), Disp: 720 capsule, Rfl: 3 .  polyethylene glycol (MIRALAX) packet, Take 1 packet (17 g total) by mouth once daily as needed (1st line treatment) Mix in 4-8ounces of fluid prior to taking., Disp: , Rfl:    Review of Systems: A complete 14 point review of systems was done and was negative except as stated above.  Objective: Physical Exam  BP (!) 113/93 (BP Location: Right upper arm, Patient Position: Sitting, BP Cuff Size: Adult)   Pulse 107   Temp 36.8 C (98.2 F) (Oral)   Resp 20   Ht 162.6 cm (5' 4)   Wt 71.9 kg (158 lb 8.2 oz)   LMP 03/09/2023   SpO2 98% Comment: RA  BMI 27.21 kg/m  Wt Readings from Last 3 Encounters:  06/15/24 71.9 kg (158 lb 8.2 oz)  06/15/24 71.9 kg (158 lb 8.2 oz)  06/15/24 71.9 kg (158 lb 8.2 oz)   Constitutional: Well-appearing, no acute distress HEENT:Sclerae anicteric. No thrush or oral ulcers Neck: Neck supple. No lymphadenopathy.  Cardiovascular: Regular rate and rhythm. No rubs, murmurs or gallups. Pulmonary/Chest: Normal work of breathing. Lungs clear to auscultation bilaterally  Abdominal: Soft. Nontender, nondistended. Bowel sounds are normal.  Extremities: negative for clubbing, No cyanosis or edema. Neurological: Alert and oriented to person, place, and time.  Moves all extremities well. Skin: Skin is warm and dry.  Psychiatric: Normal mood and affect.  Surgical Site Exam: bilateral anterior transsternal thoracotomy . Open clamshell wound medially about 3-4 cm (wound vac removed today); granulated and healing well Erythema not present Drainage: not present        Latest Ref Rng & Units 01/08/2024    8:04 AM 01/15/2024    8:30 AM 01/29/2024    9:05 AM 02/12/2024    8:57 AM 03/08/2024   10:27 AM 05/03/2024    8:12 AM 06/15/2024   10:30 AM  Spriometry  FVC Pre L 1.66  1.80  1.73  2.09  2.06  2.19  2.19  P  FEV1 Pre L 1.49  1.59  1.63  1.93  1.83  1.75  1.75  P  FEV1/FVC Pre %  89.86  88.44  94.27  92.19  88.80  79.84  80.02  P  FEF25-75% Pre L/s 2.60  2.52  2.64  3.21  2.63  1.65  1.72  P    P Preliminary result      Ancillary Orders on 06/15/2024  Component Date Value  . Sodium 06/15/2024 136   . Potassium 06/15/2024 4.7   .  Chloride 06/15/2024 101   . Carbon Dioxide (CO2) 06/15/2024 22   . Urea Nitrogen (BUN) 06/15/2024 21 (H)   . Creatinine 06/15/2024 1.0   . Glucose 06/15/2024 167 (H)   . Calcium 06/15/2024 9.0   . AST (Aspartate Aminotran* 06/15/2024 21   . ALT (Alanine Aminotransf* 06/15/2024 16   . Bilirubin, Total 06/15/2024 0.5   . Alk Phos (Alkaline Phosp* 06/15/2024 105   . Albumin 06/15/2024 3.3 (L)   . Protein, Total 06/15/2024 6.4   . Anion Gap 06/15/2024 13 (H)   . BUN/CREA Ratio 06/15/2024 21   . Glomerular Filtration Ra* 06/15/2024 69   . Magnesium 06/15/2024 1.7 (L)   . WBC (White Blood Cell Co* 06/15/2024 3.3   . Hemoglobin 06/15/2024 11.0 (L)   . Hematocrit 06/15/2024 36.0   . Platelets 06/15/2024 228   . MCV (Mean Corpuscular Vo* 06/15/2024 92   . MCH (Mean Corpuscular He* 06/15/2024 28.1   . MCHC (Mean Corpuscular H* 06/15/2024 30.6 (L)   . RBC (Red Blood Cell Coun* 06/15/2024 3.91   . RDW-CV (Red Cell Distrib* 06/15/2024 12.7   . NRBC (Nucleated Red Bloo* 06/15/2024 0.00   . NRBC % (Nucleated Red Bl* 06/15/2024 0.0   . MPV (Mean Platelet Volum* 06/15/2024 10.1   . Slide Review/Morphology 06/15/2024 Yes   . CK, Total (Creatine Kina* 06/15/2024 48   . Sedimentation Rate-Autom* 06/15/2024 49 (H)   . Segmented Neutrophil % 06/15/2024 2 (L)   . Lymphocyte % 06/15/2024 55 (H)   . Monocyte % 06/15/2024 25 (H)   . Eosinophil %  06/15/2024 13 (H)   . Basophil % 06/15/2024 4 (H)   . Myelocyte % 06/15/2024 1 (H)   . Slide Review/Morphology 06/15/2024 Yes   . Neutrophil Count, Absolu* 06/15/2024 0.07 (L)   . Total Absolute Neutrophi* 06/15/2024 0.07 (L)   . Lymphocyte Count, Absolu* 06/15/2024 1.82   . Monocyte Count,  Absolute 06/15/2024 0.83   . Eosinophil Count, Absolu* 06/15/2024 0.43   . Basophil Count, Absolute 06/15/2024 0.13   . Myelocyte Count, Absolute 06/15/2024 0.03 (H)   Hospital Outpatient Visit on 06/15/2024  Component Date Value  . FVC Pre 06/15/2024 2.19   . FEV1 Pre 06/15/2024 1.75   . FEV1/FVC Pre 06/15/2024 80.02   . FEF25-75% Pre 06/15/2024 1.72   . PEF Pre 06/15/2024 7.28   . FVC_LLN 06/15/2024 2.86   . FVC_Z-SCORE 06/15/2024 -2.82   . FVC_%PRED 06/15/2024 57 %   . FVC_Z-SCOREGRAPH 06/15/2024 -2.82   . FEV1_LLN 06/15/2024 2.28   . FEV1_Z-SCORE 06/15/2024 -2.67   . FEV1_%PRED 06/15/2024 57 %   . FEV1_Z-SCOREGRAPH 06/15/2024 -2.67   . FEV1/FVC_LLN 06/15/2024 69   . FEF25-75%_LLN 06/15/2024 1.62   . FEF25-75%_Z-SCORE 06/15/2024 -1.50   . FEF25-75%_%PRED 06/15/2024 60 %   . FEF25-75%_Z-SCOREGRAPH 06/15/2024 -1.50   . PEF_LLN 06/15/2024 5.48   . PEF_Z-SCORE 06/15/2024 -0.12   . PEF_%PRED 06/15/2024 98 %   . PEF_Z-SCOREGRAPH 06/15/2024 -0.12   . FVC_FVCL 06/15/2024 0.2   . FEV1_FEV1L 06/15/2024 0.5   . FVC_LLN 06/15/2024 2.86   . FVC_Z-SCORE 06/15/2024 -2.82   . FVC_%PRED 06/15/2024 57 %   . FVC_Z-SCOREGRAPH 06/15/2024 -2.82   . FEV1_LLN 06/15/2024 2.28   . FEV1_Z-SCORE 06/15/2024 -2.67   . FEV1_%PRED 06/15/2024 57 %   . FEV1_Z-SCOREGRAPH 06/15/2024 -2.67   . FEV1/FVC_LLN 06/15/2024 69   . FEF25-75%_LLN 06/15/2024 1.62   . FEF25-75%_Z-SCORE 06/15/2024 -1.50   . FEF25-75%_%PRED 06/15/2024 60 %   .  FEF25-75%_Z-SCOREGRAPH 06/15/2024 -1.50   . PEF_LLN 06/15/2024 5.48   . PEF_Z-SCORE 06/15/2024 -0.12   . PEF_%PRED 06/15/2024 98 %   . PEF_Z-SCOREGRAPH 06/15/2024 -0.12   . FVC_FVCL 06/15/2024 0.2   . FEV1_FEV1L 06/15/2024 0.5     Significant Micro 3/17: BAL Klebsiella pneumoniae  3/25- donor BAL Ureaplasma urealyticum + 4/2- BAL Ureaplasma urealyticum 4/10- Pleural culture VRE  HLAs [pre-transplant] - Historically negative Crossmatch negative (T cell negative, B  cell negative, cell source- lymph nodes) [post-transplant] 11/09/2023: PRA screen class I=0%, class II=0% 11/14/2023: PRA screen- class I=0%, class II=0% 11/21/23: PRA screen- class I=0%, class II=TU% 11/27/23: PRA screen- class I=0%, class II=TU%, class II with no DSA, high background 12/04/23: PRA screen- class I=0%, class II=TU% 12/11/23: PRA screen- class I=0%, class II=TU% 12/18/23: PRA screen- class I=0%, class II=TU%, class II with no DSA, high background 12/25/23: PRA screen- class I=0%, class II=TU%, class II with no DSA, high background 01/01/24: PRA screen- class I=0%, class II=TU%, class II with no DSA, high background 01/08/24: PRA screen- class I=0%, class II=TU%, class II with no DSA, high background 01/15/24: PRA screen- class I=0%, class II=TU%, class II with no DSA, high background  Pathology  Explant:  Right and left lung, 445 and 358 grams, with: Airway centered fibrosis/scarring present upon a background of diffuse and chronic parenchymal fibrosis with progression to honeycomb change. See comment.  Comment: The right and left lung show similar features. Reviewed sections show evidence of diffuse parenchymal fibrosis with peripheral and subpleural remodeling change. Fibroblast foci are conspicuous and areas of honeycomb change are present. These changes are seen in association with notable airway centered pathology including peribronchiolar organizing pneumonia, fibrosis, and chronic inflammation. The parenchymal changes appear spatially variegated, however, more uniform septal expansion is noted, resembling non-specific interstitial pneumonia (NSIP) - like fibrosis. Occasional lymphoid aggregates are noted, though do not dominate. No granulomatous inflammation is observed.  The changes are difficult to completely classify. A secondary usual interstitial pneumonia pattern is favored, with chronic hypersensitivity pneumonia or a connective tissue/systemic inflammatory disease with pulmonary  involvement as possible etiologies. It is possible the changes could represent a progressed NSIP with honeycomb change.  Clinical correlation and correlation with radiographic imaging and additional laboratory data is required.  TBBX 12/16/23- A0BX 02/05/24- A0B0    Compare: May 03, 2024.   CONCLUSIONS:   Interval placement of left PICC with tip overlying the mid to upper SVC.   Interval removal of median sternotomy wires.   Stable cardiomediastinal contours.   Increased nonspecific left lower lobe opacities possibly vascular crowding or atelectasis. Lungs otherwise unremarkable.   No significant pleural fluid.   Electronically Signed by:  Chauncy Armour, MD, [facility] Electronically Signed on:  06/15/2024 10:08 AM    Assessment:  [name] is a 48 y.o. 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  She comes to clinic today for evaluation and management of her lung allograft and immunosuppression.  Summary Assessment: No diagnosis found.   Plan:  Lung allograft status:  PFTs (reviewed and compared with priors by me) today, FEV1 and FVC noted, with decrease noted from the last two values with strong concern for limited chest expansion due to pain, pending results from bronchoscopy today. Chest imaging (personally reviewed) shows good lung expansion, trace L sided effusion, non-union of sternum noted.  Most recent bronch 6/6 was notable for no infection, no rejection, pending repeat bronch  results today Given persistence of pain, presence of non-union, discussed with Dr Date. Plan for possible wire removal for pain control HLAs historically negative   Immunosuppression:  The patient is currently on tacrolimus and prednisone . MMF on hold due to surgical site infection (VRE) FK506:  Lab Results  Component Value Date   FK506 3.3 (L) 05/29/2024   FK506  5.5 05/28/2024   FK506 4.2 (L) 05/26/2024   CSA: No results found for: CYA Goal is 10-12. We will adjust the dose accordingly based on renal function.     Wound Assessment - VRE surgical site infection/osteomyelitis:  Deep incisional surgical site infection  Antibiotics: daptomycin (rhadomyolisis, leukopenia); transitioning to ortiavancin Barbra) week of October 22 Debridement: in the operating room Dressing:  wound vac  and wound vac removed 06/15/24 Culture obtained from : wound and tissue .   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  We will continue prophylactic valganciclovir indefinitely as long as it is tolerated. She has completed treatment with IV gancyclovir, was on PO valgancyclovir BID, and then converted to PO valgancyclovir once daily 02/01/24. 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 S/p pigtail chest tube placement 4/10, VATS/ washout 4/16. L sided chest tube removed 4/25. On levofloxacin  and doxycycline  for pulmonary and pleural space infection. Completed treatment on 01/06/24. 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  Estimated Creatinine Clearance: 66.2 mL/min (based on SCr of 1 mg/dL).  This is around her baseline.  We will continue monitoring renal function closely, avoiding nephrotoxic agents, minimizing diuretics, and dosing medications based on renal function.    Cardiac  Normal Bi-V function on echo, initially volume  seeking out of OR with low CI but improved with fluid resuscitation. No CAD on pre-transplant cath.  Orthostatic hypotension- off of midodrine  Gastrointestinal Bravo/EndoFLIP 03/25/24 with [place]ter 1.9, H pylori negative Passed FEES for regular/thins. Given gagging / swallowing issues, continues to see SLP Constipation- continue miralax BID PRN and sennakot 2 tabs BID PRN  Heme Counts reviewed, stable. Chronic leukopenia  Neuro/Psych/Pain  Continues to have what seems to be c/w neuropathic pain around her sternum and clamshell incision site. Able to come off of morphine 7.5mg  q8h. Following with local pain management. Continues Dilaudid  6 mg q4 PRN (working on slow taper), duloxetine  60 mg daily, Robaxin 1000 mg BID, Lyrica  150mg  q8hrs, tizanidine 4 mg nightly.  Insomnia- continue Vistaril PRN  Endocrine Diabetes/Steroids- periods of hyper- and hypo-glycemia noted. CGM acquired and being utilized. Continuing insulin , endocrinology following closely, on insulin  pen Hypothyroidism- levothyroxine  dose decreased to 125 mcg once daily Vitamin D - level low on 9.9.25, ordered vitamin D3 50K IU, OK to take vitamin K2 Menopausal symptoms- discussed with patient, encouraged her to take vaginal estrogen at present. Patient recommended testosterone  cream, DHEA cream, oral progesterone. Would avoid cream options, could consider advancement to progesterone pills if persistent symptoms at follow-up visits Fatigue- persistent, multifactorial, but query AI. Random cortisol ordered, may consider conversion to Solucortef if low  Health Maintenance:  Covid Vaccine: Pfizer 05/15/20, 03/08/20, 01/26/20  Flu Vaccine:  08/28/21 TDAP:  07/14/19 Pneumovax:  Prevnair: 03/10/22  Shingrix: none Colonoscopy: 2000 Dexa Scan: pending- requested patient obtain Mammogram: 11/2021  Pap smear: 11/2021  Skin exam: 02/18/24  Immunization History  Administered Date(s) Administered  . COVID-19 Pfizer Monovalent Vaccine  01/26/2020, 03/08/2020  . COVID-19 Pfizer Monovalent Vaccine (original formulation) 01/26/2020, 03/08/2020, 05/15/2020  . HEP A (>=29YR) VACCINE (HAVRIX/VAQTA) 10/21/2023  . HEP B (>=53YR) VACCINE (HEPLISAV-B) 10/21/2023  . Influenza IIV4, IM pres-free 08/28/2021  . Influenza IIV4, IM preservative (6 mo+) (AFLURIA/FLUZONE QUAD) 05/15/2020  . Influenza, IM unspecified 04/30/2015, 04/12/2016, 06/23/2018, 05/25/2019, 05/15/2020, 08/30/2021  . Influenza, recombinant (Egg-Free) IM PF (18 Yr+) (Flublok Quad) 05/15/2020, 06/23/2023  . Pneumococcal (PCV20) (>=6WKS) vaccine (Prevnar 20) (aka PCV 20) 03/10/2022  . TD (>=54YR) VACCINE (TDVAX) 07/13/2009   Dispo: - start oritavancin 1200 IV over 3 hours weekly x 6 doses for VRE (will receive at clinic 2A) - can we have someone look at her wound each week that she comes for her infusion? Wound care or surgical team? - slow Dilaudid  taper (will taper with slow decrease weekly when arrives for oritavancin) - continue to hold MMF - Return to clinic in 3 weeks  Idonna Heeren is a complicated patient with multiple medical problems including lung transplant and immunosuppression management. Greater than 45 minutes was spent coordinating this patient's care.  Attestation Statement:   I personally performed the service. (TP)  Crystal Ozell Hock, MD

## 2024-07-07 ENCOUNTER — Ambulatory Visit: Admitting: Internal Medicine

## 2024-07-07 NOTE — Progress Notes (Deleted)
 Name: [name]  MRN/ DOB: [id number], 01-29-1976   Age/ Sex: 48 y.o., female    PCP: Valentin Skates, DO   Reason for Endocrinology Evaluation: Type 2 Diabetes Mellitus/Hypothyroidism     Date of Initial Endocrinology Visit: 07/07/2024     PATIENT IDENTIFIER: [name] is a 48 y.o. female with a past medical history of DM, fibromyalgia, hypothyroid. The patient presented for initial endocrinology clinic visit on 07/07/2024 for consultative assistance with her diabetes management.    HPI: [name] was    Diagnosed with DM *** Prior Medications tried/Intolerance: *** Currently checking blood sugars *** x / day,  before breakfast and ***.  Hypoglycemia episodes : ***               Symptoms: ***                 Frequency: ***/  Hemoglobin A1c has ranged from 6.7% in 2025, peaking at *** in ***. 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   Patient continues to follow-up with [facility]   Patient does follow-up with [facility] The patient did undergo sternal wire removal on 05/16/2024 due to osteomyelitis with wound VAC placement, which has since been removed She is on on Orbactiv IV infusions (she receives this and 5% dextrose )  HOME ENDOCRINE REGIMEN: Metformin  500 ER, 1 tablet daily Jardiance 10 mg daily Novolin R Levothyroxine  125 mcg daily   Statin: {Yes/No:11203} ACE-I/ARB: {YES/NO:17245} Prior Diabetic Education: {Yes/No:11203}   METER DOWNLOAD SUMMARY: Date range evaluated: *** Fingerstick Blood Glucose Tests = *** Average Number Tests/Day = *** Overall  Mean FS Glucose = *** Standard Deviation = ***  BG Ranges: Low = *** High = ***   Hypoglycemic Events/30 Days: BG < 50 = *** Episodes of symptomatic severe hypoglycemia = ***   DIABETIC COMPLICATIONS: Microvascular complications:  *** Denies: *** Last eye exam: Completed   Macrovascular complications:  *** Denies: CAD, PVD, CVA   PAST HISTORY: Past Medical History:  Past Medical History:  Diagnosis Date   Anxiety    Arthritis    Depression    Diabetes mellitus without complication (HCC)    Dyspnea    Fibromyalgia    GERD (gastroesophageal reflux disease)    Headache    migraines   Hypothyroidism 1999   post PTU Rx for hyperthyroidism   IBS (irritable bowel syndrome)    Interstitial lung disease (HCC)    Pneumonia 06/2019   Double PNA   Past Surgical History:  Past Surgical History:  Procedure Laterality Date   CESAREAN SECTION     x 2   INTERCOSTAL NERVE BLOCK Right 12/12/2019   Procedure: Intercostal Nerve Block;  Surgeon: Army Dallas NOVAK, MD;  Location: [facility] OR;  Service: Thoracic;  Laterality: Right;   VIDEO BRONCHOSCOPY Bilateral 07/12/2019   Procedure: VIDEO BRONCHOSCOPY WITH FLUORO;  Surgeon: Mannam, Praveen, MD;  Location: [facility] END[facility]COPY;  Service: Cardiopulmonary;  Laterality: Bilateral;    Social History:  reports that she has never smoked. She has never used smokeless tobacco. She reports current alcohol use. She reports that she does not use drugs.  Family History:  Family History  Problem Relation Age of Onset   Diabetes Paternal Grandmother    Hyperlipidemia Mother    Diabetes Father    Cancer Paternal Grandfather        lung   ADD / ADHD Son    Other Neg Hx        PCO     HOME MEDICATIONS: Allergies as of 07/07/2024       Reactions   Bupropion  Other (See Comments)   Body aches / headaches  Other reaction(s): Dizziness, Headache   Methimazole Other (See Comments)   Body goes into arthritic shock   Latex    Other Reaction(s):  Unknown   Tetracycline    Other Reaction(s): Unknown        Medication List        Accurate as of July 07, 2024  7:16 AM. If you have any questions, ask your nurse or doctor.          acetaminophen  650 MG CR tablet Commonly known as: TYLENOL  Take 650 mg by mouth every 8 (eight) hours as needed for pain.   AeroChamber MV inhaler Use as instructed   azaTHIOprine  50 MG tablet Commonly known as: IMURAN  TAKE 3 TABLETS BY MOUTH EVERY DAY   azithromycin  250 MG tablet Commonly known as: ZITHROMAX  Take as directed   azithromycin  250 MG tablet Commonly known as: ZITHROMAX  Take as directed   clobetasol ointment 0.05 % Commonly known as: TEMOVATE APPLY THIN COAT TO AFFECTED AREA TWICE A DAY   Dexcom G7 Sensor Misc 1 each by Other route.   DULoxetine  60 MG capsule Commonly known as: CYMBALTA  TAKE 1 CAPSULE BY MOUTH EVERY DAY What changed: how much to take   estradiol 0.1 MG/GM vaginal cream Commonly known as: ESTRACE INSERT 1 G TWICE A WEEK BY VAGINAL ROUTE AT BEDTIME FOR 90 DAYS. 42.5G=90DAY   ferrous sulfate 324 MG Tbec Take 324 mg by mouth.   Global Easy Glide Insulin  Syr 31G X 15/64 0.5 ML Misc Generic drug: Insulin  Syringe-Needle U-100 1 Syringe by Other route.   HYDROcodone  bit-homatropine 5-1.5 MG/5ML syrup Commonly known as: HYCODAN Take 5 mLs by mouth every 6 (six) hours as needed for cough.   HYDROcodone  bit-homatropine 5-1.5 MG/5ML syrup Commonly known as: HYCODAN Take 5 mLs by mouth every 6 (six) hours as needed for cough.   hydroxychloroquine 200 MG tablet Commonly known as: PLAQUENIL Take 200 mg by mouth daily.   hydrOXYzine 25 MG capsule Commonly known as: VISTARIL TAKE 1 CAPSULE BY MOUTH 3 TIMES DAILY AS NEEDED FOR ANXIETY.   ipratropium-albuterol  0.5-2.5 (3) MG/3ML Soln Commonly known as: DUONEB Take 3 mLs by nebulization every 6 (six) hours as needed.   Jardiance 10 MG Tabs tablet Generic drug: empagliflozin Take 10 mg by  mouth daily.   levalbuterol  45 MCG/ACT inhaler Commonly known as: XOPENEX  HFA INHALE 2 PUFFS INTO THE LUNGS EVERY 4 HOURS AS NEEDED FOR WHEEZE   levothyroxine  125 MCG tablet Commonly known as: SYNTHROID  Take 125 mcg by mouth daily before breakfast.   metFORMIN  500 MG 24 hr tablet Commonly known as: GLUCOPHAGE -XR Take 500 mg by mouth in the morning and at bedtime. 1000mg  total daily   methocarbamol 500 MG tablet Commonly known as: ROBAXIN TAKE 1 TABLET (500 MG TOTAL) BY MOUTH 4 (FOUR) TIMES DAILY FOR 30 DAYS   midodrine 5 MG tablet Commonly known as: PROAMATINE Take by mouth.   mycophenolate  250 MG capsule Commonly known as: CELLCEPT  TAKE 4  CAPSULES (1,000 MG TOTAL) BY MOUTH EVERY 12 (TWELVE) HOURS   NEOMYCIN -POLYMYXIN-HYDROCORTISONE 1 % Soln OTIC solution Commonly known as: CORTISPORIN Apply 1-2 drops to toe BID after soaking   NovoLIN R 100 UNIT/ML injection Generic drug: insulin  regular   NovoLIN R FlexPen ReliOn 100 UNIT/ML FlexPen Generic drug: Insulin  Regular Human INJECT 10-20U UNDER SKIN BEFORE MEALS 18U BREAKFAST 10U LUNCH 12U DINNER PLUS CORRECTION MAX 60U/DAY   omeprazole  20 MG capsule Commonly known as: PRILOSEC Take 20 mg by mouth.   pantoprazole  40 MG tablet Commonly known as: PROTONIX  TAKE 1 TABLET BY MOUTH EVERY DAY   Precision QID Test test strip Generic drug: glucose blood 1 strip.   predniSONE  20 MG tablet Commonly known as: DELTASONE  Take 40mg  for 5 days   pregabalin  150 MG capsule Commonly known as: Lyrica  Take 1 capsule (150 mg total) by mouth 2 (two) times daily. What changed: when to take this   propranolol 10 MG tablet Commonly known as: INDERAL TAKE 0.5 TABLET (5 MG TOTAL) BY MOUTH EVERY 8 (EIGHT) HOURS   Senexon-S 8.6-50 MG tablet Generic drug: senna-docusate TAKE 2 TABLETS BY MOUTH 2 TIMES DAILY AS NEEDED FOR CONSTIPATION.   tacrolimus 0.5 MG capsule Commonly known as: PROGRAF Take six capsules (3 mg total dose) by mouth  every AM & take five capsules (2.5 mg total dose) by mouth every PM.   tiZANidine 4 MG tablet Commonly known as: ZANAFLEX TAKE HALF TO 1 TABLET (2-4 MG TOTAL) BY MOUTH NIGHTLY AS NEEDED FOR MUSCLE SPASMS.   valGANciclovir 450 MG tablet Commonly known as: VALCYTE TAKE 2 TABLETS (900 MG TOTAL) BY MOUTH ONCE DAILY   Veltassa 8.4 g packet Generic drug: patiromer Take 1 packet by mouth daily.         ALLERGIES: Allergies  Allergen Reactions   Bupropion  Other (See Comments)    Body aches / headaches  Other reaction(s): Dizziness, Headache   Methimazole Other (See Comments)    Body goes into arthritic shock   Latex     Other Reaction(s): Unknown   Tetracycline     Other Reaction(s): Unknown     REVIEW OF SYSTEMS: A comprehensive ROS was conducted with the patient and is negative except as per HPI and below:  ROS    OBJECTIVE:   VITAL SIGNS: There were no vitals taken for this visit.   PHYSICAL EXAM:  General: Pt appears well and is in NAD  Neck: General: Supple without adenopathy or carotid bruits. Thyroid : Thyroid  size normal.  No goiter or nodules appreciated.   Lungs: Clear with good BS bilat   Heart: RRR   Abdomen:  soft, nontender  Extremities:  Lower extremities - No pretibial edema.   Skin:  No rash noted.  Neuro: MS is good with appropriate affect, pt is alert and Ox3    DM foot exam:    DATA REVIEWED:  Lab Results  Component Value Date   HGBA1C 6.1 07/22/2021   HGBA1C 8.6 (H) 12/02/2019   HGBA1C 7.7 (H) 11/01/2019    Labs through [facility] 06/24/2024 Sodium 130 Potassium 4.5 BUN 24 Creatinine 1.0 Glucose 152 GFR 69    ASSESSMENT / PLAN / RECOMMENDATIONS:   1) Type 2 Diabetes Mellitus, ***controlled, With*** complications - Most recent A1c of *** %. Goal A1c < 7.0 %.    Plan: GENERAL: ***  MEDICATIONS: ***  EDUCATION / INSTRUCTIONS: BG monitoring instructions: Patient is instructed to check her blood sugars *** times a day,  ***. Call [facility]  cl[facility] if: BG persistently < 70  I reviewed the Rule of 15 for the treatment of hypoglycemia in detail with the patient. Literature supplied.   2) Diabetic complications:  Eye: Does *** have known diabetic retinopathy.  Neuro/ Feet: Does *** have known diabetic peripheral neuropathy. Renal: Patient does *** have known baseline CKD. She is *** on an ACEI/ARB at present.  3) Lipids: Patient is *** on a statin.    4) Hypertension: ***  at goal of < 140/90 mmHg.       Signed electronically by: Stefano Redgie Butts, MD  [facility]  [facility] [address] [place], [place] [address] Phone: [phone] FAX: [phone]   CC: Valentin Skates, DO [address] [place] [address] Phone: [phone]  Fax: [phone]    Return to Endocrinology clinic as below: Future Appointments  Date Time Provider Department Center  07/07/2024  9:30 AM Ariel Dimitri, Donell Redgie, MD [facility] None
